# Patient Record
Sex: Female | Born: 1955
Health system: Southern US, Community
[De-identification: ages and names within clinical notes are randomized; demographics above are authoritative.]

## PROBLEM LIST (undated history)

## (undated) DIAGNOSIS — E7800 Pure hypercholesterolemia, unspecified: Secondary | ICD-10-CM

## (undated) DIAGNOSIS — R739 Hyperglycemia, unspecified: Secondary | ICD-10-CM

## (undated) DIAGNOSIS — M48061 Spinal stenosis, lumbar region without neurogenic claudication: Secondary | ICD-10-CM

## (undated) DIAGNOSIS — D329 Benign neoplasm of meninges, unspecified: Secondary | ICD-10-CM

## (undated) DIAGNOSIS — Z9289 Personal history of other medical treatment: Secondary | ICD-10-CM

## (undated) DIAGNOSIS — I743 Embolism and thrombosis of arteries of the lower extremities: Secondary | ICD-10-CM

## (undated) DIAGNOSIS — B029 Zoster without complications: Secondary | ICD-10-CM

## (undated) DIAGNOSIS — I80239 Phlebitis and thrombophlebitis of unspecified tibial vein: Secondary | ICD-10-CM

## (undated) DIAGNOSIS — K449 Diaphragmatic hernia without obstruction or gangrene: Secondary | ICD-10-CM

## (undated) DIAGNOSIS — I10 Essential (primary) hypertension: Secondary | ICD-10-CM

## (undated) DIAGNOSIS — I739 Peripheral vascular disease, unspecified: Secondary | ICD-10-CM

## (undated) DIAGNOSIS — L03113 Cellulitis of right upper limb: Secondary | ICD-10-CM

## (undated) DIAGNOSIS — R809 Proteinuria, unspecified: Secondary | ICD-10-CM

## (undated) DIAGNOSIS — G500 Trigeminal neuralgia: Secondary | ICD-10-CM

## (undated) DIAGNOSIS — M1990 Unspecified osteoarthritis, unspecified site: Secondary | ICD-10-CM

## (undated) DIAGNOSIS — I480 Paroxysmal atrial fibrillation: Secondary | ICD-10-CM

## (undated) DIAGNOSIS — I4891 Unspecified atrial fibrillation: Secondary | ICD-10-CM

## (undated) HISTORY — DX: Peripheral vascular disease, unspecified: I73.9

## (undated) HISTORY — DX: Cellulitis of right upper limb: L03.113

## (undated) HISTORY — PX: LOWER EXTREMITY ANGIOGRAM: SHX5955

## (undated) HISTORY — DX: Unspecified osteoarthritis, unspecified site: M19.90

## (undated) HISTORY — DX: Proteinuria, unspecified: R80.9

## (undated) HISTORY — DX: Essential (primary) hypertension: I10

## (undated) HISTORY — DX: Unspecified atrial fibrillation: I48.91

## (undated) HISTORY — DX: Diaphragmatic hernia without obstruction or gangrene: K44.9

## (undated) HISTORY — DX: Hyperglycemia, unspecified: R73.9

## (undated) HISTORY — PX: AORTOGRAM: SHX6300

## (undated) HISTORY — PX: CHOLECYSTECTOMY: SHX55

## (undated) HISTORY — DX: Trigeminal neuralgia: G50.0

## (undated) HISTORY — DX: Pure hypercholesterolemia, unspecified: E78.00

## (undated) HISTORY — DX: Personal history of other medical treatment: Z92.89

## (undated) HISTORY — DX: Zoster without complications: B02.9

## (undated) HISTORY — DX: Embolism and thrombosis of arteries of the lower extremities: I74.3

## (undated) HISTORY — DX: Spinal stenosis, lumbar region without neurogenic claudication: M48.061

## (undated) HISTORY — PX: KNEE ARTHROSCOPY: SUR90

## (undated) HISTORY — PX: CARDIAC CATHETERIZATION: SHX172

## (undated) HISTORY — PX: JOINT REPLACEMENT: SHX530

## (undated) HISTORY — DX: Phlebitis and thrombophlebitis of unspecified tibial vein: I80.239

## (undated) HISTORY — DX: Paroxysmal atrial fibrillation: I48.0

## (undated) HISTORY — DX: Benign neoplasm of meninges, unspecified: D32.9

---

## 2005-03-20 ENCOUNTER — Ambulatory Visit: Payer: Self-pay | Admitting: Internal Medicine

## 2006-02-17 ENCOUNTER — Ambulatory Visit: Payer: Self-pay | Admitting: Urology

## 2006-03-09 ENCOUNTER — Ambulatory Visit: Payer: Self-pay | Admitting: Gastroenterology

## 2006-03-23 ENCOUNTER — Ambulatory Visit: Payer: Self-pay | Admitting: Internal Medicine

## 2007-03-29 ENCOUNTER — Ambulatory Visit: Payer: Self-pay | Admitting: Internal Medicine

## 2008-03-29 ENCOUNTER — Ambulatory Visit: Payer: Self-pay | Admitting: Internal Medicine

## 2008-05-04 ENCOUNTER — Ambulatory Visit: Payer: Self-pay | Admitting: General Practice

## 2008-05-16 ENCOUNTER — Ambulatory Visit: Payer: Self-pay | Admitting: General Practice

## 2008-06-16 ENCOUNTER — Ambulatory Visit: Payer: Self-pay | Admitting: Orthopedic Surgery

## 2008-06-20 ENCOUNTER — Ambulatory Visit: Payer: Self-pay | Admitting: Orthopedic Surgery

## 2009-04-09 ENCOUNTER — Ambulatory Visit: Payer: Self-pay | Admitting: Internal Medicine

## 2009-06-14 ENCOUNTER — Ambulatory Visit: Payer: Self-pay | Admitting: Gastroenterology

## 2009-06-14 LAB — HM COLONOSCOPY

## 2009-07-16 ENCOUNTER — Ambulatory Visit: Payer: Self-pay | Admitting: Obstetrics and Gynecology

## 2009-07-20 ENCOUNTER — Ambulatory Visit: Payer: Self-pay | Admitting: Obstetrics and Gynecology

## 2010-05-01 ENCOUNTER — Ambulatory Visit: Payer: Self-pay | Admitting: Internal Medicine

## 2011-01-22 ENCOUNTER — Ambulatory Visit: Payer: Self-pay | Admitting: Physical Medicine and Rehabilitation

## 2011-05-06 ENCOUNTER — Ambulatory Visit: Payer: Self-pay | Admitting: Internal Medicine

## 2011-05-06 LAB — HM MAMMOGRAPHY

## 2011-05-13 ENCOUNTER — Encounter: Payer: Self-pay | Admitting: Physical Medicine and Rehabilitation

## 2011-06-02 ENCOUNTER — Encounter: Payer: Self-pay | Admitting: Physical Medicine and Rehabilitation

## 2011-06-12 LAB — LIPID PANEL: Cholesterol: 114 mg/dL (ref 0–200)

## 2011-06-12 LAB — BASIC METABOLIC PANEL
BUN: 24 mg/dL — AB (ref 4–21)
Creatinine: 0.8 mg/dL (ref <=1.1)
Glucose: 106 mg/dL

## 2011-06-12 LAB — HEPATIC FUNCTION PANEL: ALT: 20 U/L (ref 7–35)

## 2011-06-12 LAB — CBC AND DIFFERENTIAL: HCT: 45 % (ref 36–46)

## 2011-12-26 LAB — HM PAP SMEAR: HM Pap smear: NEGATIVE

## 2012-05-06 ENCOUNTER — Ambulatory Visit: Payer: Self-pay | Admitting: Unknown Physician Specialty

## 2012-07-21 ENCOUNTER — Telehealth: Payer: Self-pay | Admitting: Internal Medicine

## 2012-07-21 NOTE — Telephone Encounter (Signed)
Refill request for attenolol 100 mg tablet Sig: take 1 tablet by mouth once daily  Patient does have an appointment on 09/24/12

## 2012-07-26 MED ORDER — ATENOLOL 100 MG PO TABS: 100.0000 mg | ORAL_TABLET | Freq: Every day | ORAL | Status: DC

## 2012-07-26 NOTE — Telephone Encounter (Signed)
Sent in to pharmacy.  

## 2012-09-22 ENCOUNTER — Encounter: Payer: Self-pay | Admitting: *Deleted

## 2012-09-24 ENCOUNTER — Encounter: Payer: Self-pay | Admitting: Internal Medicine

## 2012-09-24 ENCOUNTER — Ambulatory Visit (INDEPENDENT_AMBULATORY_CARE_PROVIDER_SITE_OTHER): Payer: BC Managed Care – PPO | Admitting: Internal Medicine

## 2012-09-24 VITALS — BP 120/80 | HR 72 | Temp 97.8°F | Ht 60.0 in | Wt 225.5 lb

## 2012-09-24 DIAGNOSIS — R739 Hyperglycemia, unspecified: Secondary | ICD-10-CM | POA: Insufficient documentation

## 2012-09-24 DIAGNOSIS — Z23 Encounter for immunization: Secondary | ICD-10-CM

## 2012-09-24 DIAGNOSIS — I10 Essential (primary) hypertension: Secondary | ICD-10-CM

## 2012-09-24 DIAGNOSIS — E7800 Pure hypercholesterolemia, unspecified: Secondary | ICD-10-CM | POA: Insufficient documentation

## 2012-09-24 DIAGNOSIS — R7309 Other abnormal glucose: Secondary | ICD-10-CM

## 2012-09-24 LAB — BASIC METABOLIC PANEL
BUN: 32 mg/dL — ABNORMAL HIGH (ref 6–23)
Creatinine, Ser: 1.1 mg/dL (ref 0.4–1.2)
GFR: 56.75 mL/min — ABNORMAL LOW (ref >60.00)
Potassium: 3.8 mEq/L (ref 3.5–5.1)

## 2012-09-24 LAB — HEPATIC FUNCTION PANEL
ALT: 22 U/L (ref 0–35)
Alkaline Phosphatase: 80 U/L (ref 39–117)
Bilirubin, Direct: 0.1 mg/dL (ref 0.0–0.3)
Total Protein: 7.8 g/dL (ref 6.0–8.3)

## 2012-09-24 LAB — LIPID PANEL
Cholesterol: 127 mg/dL (ref 0–200)
Triglycerides: 112 mg/dL (ref 0.0–149.0)
VLDL: 22.4 mg/dL (ref 0.0–40.0)

## 2012-09-24 LAB — HEMOGLOBIN A1C: Hgb A1c MFr Bld: 6.8 % — ABNORMAL HIGH (ref 4.6–6.5)

## 2012-09-26 ENCOUNTER — Encounter: Payer: Self-pay | Admitting: Internal Medicine

## 2012-09-26 MED ORDER — ATENOLOL 100 MG PO TABS: 100.0000 mg | ORAL_TABLET | Freq: Every day | ORAL | Status: DC

## 2012-09-26 MED ORDER — PROGESTERONE MICRONIZED 100 MG PO CAPS: 100.0000 mg | ORAL_CAPSULE | Freq: Every day | ORAL | Status: DC

## 2012-09-26 MED ORDER — LOSARTAN POTASSIUM-HCTZ 100-25 MG PO TABS: 1.0000 | ORAL_TABLET | Freq: Every day | ORAL | Status: DC

## 2012-09-26 MED ORDER — ATORVASTATIN CALCIUM 40 MG PO TABS: 40.0000 mg | ORAL_TABLET | Freq: Every day | ORAL | Status: DC

## 2012-09-26 MED ORDER — MELOXICAM 15 MG PO TABS: 15.0000 mg | ORAL_TABLET | Freq: Every day | ORAL | Status: DC

## 2012-09-26 NOTE — Progress Notes (Signed)
  Subjective:    Patient ID: Kristin James, female    DOB: 07/17/1956, 57 y.o.   MRN: [id number]  HPI 57 year old female with past history of hypertension, hyperglycemia and hypercholesterolemia who comes in today for a scheduled follow up.  States she is doing well.  Back and leg doing well.  Takes mobic prn.  No chest pain or tightness.  Discussed importance of diet and exercise.  Blood pressure has been doing well.  Breathing stable.   Past Medical History  Diagnosis Date  . Hypertension   . Hypercholesteremia   . Hyperglycemia   . Osteoarthritis   . Proteinuria     Current Outpatient Prescriptions on File Prior to Visit  Medication Sig Dispense Refill  . atenolol (TENORMIN) 100 MG tablet Take 1 tablet (100 mg total) by mouth daily.  90 tablet  1  . felodipine (PLENDIL) 10 MG 24 hr tablet Take 10 mg by mouth daily.      Marland Kitchen losartan-hydrochlorothiazide (HYZAAR) 100-25 MG per tablet Take 1 tablet by mouth daily.  90 tablet  1  . Multiple Vitamins-Minerals (MULTIVITAMIN PO) Take by mouth.      . progesterone (PROMETRIUM) 100 MG capsule Take 1 capsule (100 mg total) by mouth daily.  90 capsule  1    Review of Systems Patient denies any headache, lightheadedness or dizziness. No sinus or allergy symptoms.   No chest pain, tightness or palpitations.  No increased shortness of breath, cough or congestion.  No nausea or vomiting.  No acid reflux.  No abdominal pain or cramping.  No bowel change, such as diarrhea, constipation, BRBPR or melana.  No urine change.  Has joined [employer].  Plans to get more serious about her diet.       Objective:   Physical Exam Filed Vitals:   09/24/12 0800  BP: 120/80  Pulse: 72  Temp: 97.8 F (36.6 C)   Blood pressure recheck;  62/72  57 year old female in no acute distress.   HEENT:  Nares- clear.  Oropharynx - without lesions. NECK:  Supple.  Nontender.  No audible bruit.  HEART:  Appears to be regular. LUNGS:  No crackles or wheezing  audible.  Respirations even and unlabored.  RADIAL PULSE:  Equal bilaterally.  ABDOMEN:  Soft, nontender.  Bowel sounds present and normal.  No audible abdominal bruit.   EXTREMITIES:  No increased edema present.  DP pulses palpable and equal bilaterally.           Assessment & Plan:  MSK.  Back and leg pain much improved.  Only on mobic now.  Follow.    HEMATOLOGY.  Last H/H stable.  Follow.    CARDIOVASCULAR.  Asymptomatic.    WEIGHT LOSS.  Has joined [employer].  Follow.    HEALTH MAINTENANCE.  Last physical 12/26/11.  Mammogram 05/06/11 - BiRADS II.  Obtain mammogram results from 2013.  Pap 2010 negative with negative HPV.  Had repeat pap 12/26/11.  Colonoscopy 10/10 revealed diverticulosis.  Recommended repeat colonoscopy five years.

## 2012-09-26 NOTE — Assessment & Plan Note (Signed)
On lipitor.  Has joined [employer].  Check lipid panel and liver function.

## 2012-09-26 NOTE — Assessment & Plan Note (Signed)
Low carb diet and exercise.  Weight loss.  Check metabolic panel and a1c.

## 2012-09-26 NOTE — Assessment & Plan Note (Signed)
Blood pressure under good control.  Same medication regimen.  Check metabolic panel.    

## 2012-09-27 ENCOUNTER — Other Ambulatory Visit: Payer: Self-pay | Admitting: Internal Medicine

## 2012-09-27 ENCOUNTER — Telehealth: Payer: Self-pay | Admitting: Internal Medicine

## 2012-09-27 DIAGNOSIS — R799 Abnormal finding of blood chemistry, unspecified: Secondary | ICD-10-CM

## 2012-09-27 DIAGNOSIS — N289 Disorder of kidney and ureter, unspecified: Secondary | ICD-10-CM

## 2012-09-27 NOTE — Telephone Encounter (Signed)
Pt was notified of labs via my chart.  She needs a non fasting lab appt within the next 2-3 weeks.  Please schedule and notify pt of lab appt.  Thanks.

## 2012-09-27 NOTE — Progress Notes (Signed)
Follow up met b ordered.   

## 2012-09-29 NOTE — Telephone Encounter (Signed)
Appointment 2/13 @ 8:15/[facility] pt aware of appointment

## 2012-10-14 ENCOUNTER — Other Ambulatory Visit: Payer: BC Managed Care – PPO

## 2012-10-21 ENCOUNTER — Other Ambulatory Visit (INDEPENDENT_AMBULATORY_CARE_PROVIDER_SITE_OTHER): Payer: BC Managed Care – PPO

## 2012-10-21 DIAGNOSIS — R799 Abnormal finding of blood chemistry, unspecified: Secondary | ICD-10-CM

## 2012-10-21 DIAGNOSIS — R7989 Other specified abnormal findings of blood chemistry: Secondary | ICD-10-CM

## 2012-10-21 LAB — BASIC METABOLIC PANEL
BUN: 25 mg/dL — ABNORMAL HIGH (ref 6–23)
Calcium: 9.5 mg/dL (ref 8.4–10.5)
Chloride: 104 mEq/L (ref 96–112)
Creatinine, Ser: 0.9 mg/dL (ref 0.4–1.2)

## 2012-10-22 ENCOUNTER — Telehealth: Payer: Self-pay | Admitting: Internal Medicine

## 2012-10-22 ENCOUNTER — Other Ambulatory Visit: Payer: Self-pay | Admitting: *Deleted

## 2012-10-22 NOTE — Telephone Encounter (Signed)
Notified of labs via my chart 

## 2012-10-25 MED ORDER — FELODIPINE ER 10 MG PO TB24: 10.0000 mg | ORAL_TABLET | Freq: Every day | ORAL | Status: DC

## 2012-11-26 ENCOUNTER — Other Ambulatory Visit: Payer: Self-pay | Admitting: *Deleted

## 2012-11-26 MED ORDER — MELOXICAM 15 MG PO TABS: 15.0000 mg | ORAL_TABLET | Freq: Every day | ORAL | Status: DC

## 2013-01-23 ENCOUNTER — Other Ambulatory Visit: Payer: Self-pay | Admitting: Internal Medicine

## 2013-01-23 MED ORDER — MELOXICAM 15 MG PO TABS: 15.0000 mg | ORAL_TABLET | Freq: Every day | ORAL | Status: DC

## 2013-01-23 NOTE — Progress Notes (Signed)
Refilled mobic #30 with one refill 

## 2013-01-28 ENCOUNTER — Encounter: Payer: Self-pay | Admitting: Internal Medicine

## 2013-01-28 ENCOUNTER — Ambulatory Visit (INDEPENDENT_AMBULATORY_CARE_PROVIDER_SITE_OTHER): Payer: BC Managed Care – PPO | Admitting: Internal Medicine

## 2013-01-28 ENCOUNTER — Other Ambulatory Visit (HOSPITAL_COMMUNITY)
Admission: RE | Admit: 2013-01-28 | Discharge: 2013-01-28 | Disposition: A | Discharge status: Home / Self Care | Payer: BC Managed Care – PPO | Source: Ambulatory Visit | Attending: Internal Medicine | Admitting: Internal Medicine

## 2013-01-28 VITALS — BP 120/70 | HR 69 | Temp 98.6°F | Ht 60.0 in | Wt 224.5 lb

## 2013-01-28 DIAGNOSIS — Z1151 Encounter for screening for human papillomavirus (HPV): Secondary | ICD-10-CM | POA: Insufficient documentation

## 2013-01-28 DIAGNOSIS — I10 Essential (primary) hypertension: Secondary | ICD-10-CM

## 2013-01-28 DIAGNOSIS — E7800 Pure hypercholesterolemia, unspecified: Secondary | ICD-10-CM

## 2013-01-28 DIAGNOSIS — R7309 Other abnormal glucose: Secondary | ICD-10-CM

## 2013-01-28 DIAGNOSIS — Z1211 Encounter for screening for malignant neoplasm of colon: Secondary | ICD-10-CM

## 2013-01-28 DIAGNOSIS — Z124 Encounter for screening for malignant neoplasm of cervix: Secondary | ICD-10-CM

## 2013-01-28 DIAGNOSIS — R739 Hyperglycemia, unspecified: Secondary | ICD-10-CM

## 2013-01-28 DIAGNOSIS — Z01419 Encounter for gynecological examination (general) (routine) without abnormal findings: Secondary | ICD-10-CM | POA: Insufficient documentation

## 2013-01-28 LAB — CBC WITH DIFFERENTIAL/PLATELET
Basophils Relative: 0.8 % (ref 0.0–3.0)
Eosinophils Relative: 1.6 % (ref 0.0–5.0)
HCT: 46.6 % — ABNORMAL HIGH (ref 36.0–46.0)
Hemoglobin: 16 g/dL — ABNORMAL HIGH (ref 12.0–15.0)
MCV: 88.2 fl (ref 78.0–100.0)
Monocytes Absolute: 0.8 10*3/uL (ref 0.1–1.0)
Neutro Abs: 6.7 10*3/uL (ref 1.4–7.7)
Neutrophils Relative %: 64.1 % (ref 43.0–77.0)
RBC: 5.28 Mil/uL — ABNORMAL HIGH (ref 3.87–5.11)
WBC: 10.5 10*3/uL (ref 4.5–10.5)

## 2013-01-28 LAB — LIPID PANEL
HDL: 32.6 mg/dL — ABNORMAL LOW (ref >39.00)
LDL Cholesterol: 79 mg/dL (ref 0–99)
Total CHOL/HDL Ratio: 4
VLDL: 18.8 mg/dL (ref 0.0–40.0)

## 2013-01-28 LAB — BASIC METABOLIC PANEL
BUN: 25 mg/dL — ABNORMAL HIGH (ref 6–23)
Chloride: 104 mEq/L (ref 96–112)
Potassium: 4.3 mEq/L (ref 3.5–5.1)
Sodium: 138 mEq/L (ref 135–145)

## 2013-01-28 LAB — MICROALBUMIN / CREATININE URINE RATIO: Microalb, Ur: 28.9 mg/dL — ABNORMAL HIGH (ref 0.0–1.9)

## 2013-01-28 LAB — HEMOGLOBIN A1C: Hgb A1c MFr Bld: 6.8 % — ABNORMAL HIGH (ref 4.6–6.5)

## 2013-01-28 LAB — HEPATIC FUNCTION PANEL
ALT: 26 U/L (ref 0–35)
Bilirubin, Direct: 0.2 mg/dL (ref 0.0–0.3)
Total Bilirubin: 1 mg/dL (ref 0.3–1.2)

## 2013-01-28 MED ORDER — PANTOPRAZOLE SODIUM 40 MG PO TBEC: 40.0000 mg | DELAYED_RELEASE_TABLET | Freq: Every day | ORAL | Status: DC

## 2013-01-28 NOTE — Progress Notes (Signed)
Subjective:    Patient ID: Kristin James, female    DOB: [birth year], 57 y.o.   MRN: [id number]  HPI 57 year old female with past history of hypertension, hyperglycemia and hypercholesterolemia who comes in today to follow up on these issues as well as for a complete physical exam.   States she is doing well.  Back and leg doing well.  Takes mobic daily.  Controls pain.  Have discussed risk and side effects of mobic.  No chest pain or tightness.  Discussed importance of diet and exercise.  Blood pressure has been doing well.  Breathing stable.  Recently had some vomiting and diarrhea.  This has resolved.  Bowels normal now.  Appetite normal.  Was self limited.     Past Medical History  Diagnosis Date  . Hypertension   . Hypercholesteremia   . Hyperglycemia   . Osteoarthritis   . Proteinuria     Current Outpatient Prescriptions on File Prior to Visit  Medication Sig Dispense Refill  . atenolol (TENORMIN) 100 MG tablet Take 1 tablet (100 mg total) by mouth daily.  90 tablet  1  . atorvastatin (LIPITOR) 40 MG tablet Take 1 tablet (40 mg total) by mouth daily.  90 tablet  1  . felodipine (PLENDIL) 10 MG 24 hr tablet Take 1 tablet (10 mg total) by mouth daily.  30 tablet  3  . losartan-hydrochlorothiazide (HYZAAR) 100-25 MG per tablet Take 1 tablet by mouth daily.  90 tablet  1  . meloxicam (MOBIC) 15 MG tablet Take 1 tablet (15 mg total) by mouth daily.  30 tablet  1  . Multiple Vitamins-Minerals (MULTIVITAMIN PO) Take by mouth.      . progesterone (PROMETRIUM) 100 MG capsule Take 1 capsule (100 mg total) by mouth daily.  90 capsule  1   No current facility-administered medications on file prior to visit.    Review of Systems Patient denies any headache, lightheadedness or dizziness. No sinus or allergy symptoms.   No chest pain, tightness or palpitations.  No increased shortness of breath, cough or congestion.  No nausea or vomiting.  No acid reflux.  No abdominal pain or cramping.  No  bowel change, such as diarrhea, constipation, BRBPR or melana.  No urine change.       Objective:   Physical Exam  Filed Vitals:   01/28/13 0830  BP: 120/70  Pulse: 69  Temp: 98.6 F (37 C)   Blood pressure recheck;  120/74, pulse 32  57 year old female in no acute distress.   HEENT:  Nares- clear.  Oropharynx - without lesions. NECK:  Supple.  Nontender.  No audible bruit.  HEART:  Appears to be regular. LUNGS:  No crackles or wheezing audible.  Respirations even and unlabored.  RADIAL PULSE:  Equal bilaterally.    BREASTS:  No nipple discharge or nipple retraction present.  Could not appreciate any distinct nodules or axillary adenopathy.  ABDOMEN:  Soft, nontender.  Bowel sounds present and normal.  No audible abdominal bruit.  GU:  Normal external genitalia.  Vaginal vault without lesions.  Cervix identified.  Pap performed. Could not appreciate any adnexal masses or tenderness.   RECTAL:  Heme negative.   EXTREMITIES:  No increased edema present.  DP pulses palpable and equal bilaterally.           Assessment & Plan:  MSK.  Back and leg pain much improved.  On mobic.  Follow.    HEMATOLOGY.  Last H/H stable.  Follow.    CARDIOVASCULAR.  Asymptomatic.    WEIGHT LOSS.  Have discussed the importance of diet and exercise.  Follow.   HEALTH MAINTENANCE.  Physical today.  Mammogram 05/06/12 - BiRADS II.   Pap 2010 negative with negative HPV.  Had repeat pap 12/26/11.  Pap today.  Colonoscopy 10/10 revealed diverticulosis.  Recommended repeat colonoscopy five years.  IFOB today.

## 2013-01-30 ENCOUNTER — Telehealth: Payer: Self-pay | Admitting: Internal Medicine

## 2013-01-30 ENCOUNTER — Encounter: Payer: Self-pay | Admitting: Internal Medicine

## 2013-01-30 DIAGNOSIS — Z79899 Other long term (current) drug therapy: Secondary | ICD-10-CM

## 2013-01-30 NOTE — Assessment & Plan Note (Signed)
On lipitor.  Low cholesterol diet and exercise.  Check lipid panel and liver function.   

## 2013-01-30 NOTE — Assessment & Plan Note (Signed)
Blood pressure under good control.  Same medication regimen.  Check metabolic panel.    

## 2013-01-30 NOTE — Assessment & Plan Note (Signed)
Low carb diet and exercise.  Weight loss.  Check metabolic panel and a1c.

## 2013-01-30 NOTE — Telephone Encounter (Signed)
Pt notified of lab results and need for a f/u lab in 2-3 weeks (via my chart).  Please schedule her for a non fasting lab appointment in 2-3 weeks.  Please call her with appt date and time.  Thanks.

## 2013-01-31 NOTE — Telephone Encounter (Signed)
Sent my chart message letting pt know when appointment is  °

## 2013-02-02 ENCOUNTER — Encounter: Payer: Self-pay | Admitting: Internal Medicine

## 2013-02-16 ENCOUNTER — Other Ambulatory Visit: Payer: Self-pay | Admitting: *Deleted

## 2013-02-16 MED ORDER — FELODIPINE ER 10 MG PO TB24: 10.0000 mg | ORAL_TABLET | Freq: Every day | ORAL | Status: DC

## 2013-02-21 ENCOUNTER — Other Ambulatory Visit (INDEPENDENT_AMBULATORY_CARE_PROVIDER_SITE_OTHER): Payer: BC Managed Care – PPO

## 2013-02-21 DIAGNOSIS — Z79899 Other long term (current) drug therapy: Secondary | ICD-10-CM

## 2013-02-21 LAB — BASIC METABOLIC PANEL
Chloride: 104 mEq/L (ref 96–112)
GFR: 53.17 mL/min — ABNORMAL LOW (ref >60.00)
Glucose, Bld: 147 mg/dL — ABNORMAL HIGH (ref 70–99)
Potassium: 4.3 mEq/L (ref 3.5–5.1)
Sodium: 138 mEq/L (ref 135–145)

## 2013-02-28 ENCOUNTER — Encounter: Payer: Self-pay | Admitting: Internal Medicine

## 2013-03-24 ENCOUNTER — Other Ambulatory Visit: Payer: Self-pay | Admitting: *Deleted

## 2013-03-25 ENCOUNTER — Other Ambulatory Visit: Payer: Self-pay | Admitting: *Deleted

## 2013-03-25 MED ORDER — LOSARTAN POTASSIUM-HCTZ 100-25 MG PO TABS: 1.0000 | ORAL_TABLET | Freq: Every day | ORAL | Status: DC

## 2013-03-25 MED ORDER — MELOXICAM 15 MG PO TABS: 15.0000 mg | ORAL_TABLET | Freq: Every day | ORAL | Status: DC

## 2013-04-03 ENCOUNTER — Other Ambulatory Visit: Payer: Self-pay | Admitting: Internal Medicine

## 2013-04-03 MED ORDER — PROGESTERONE MICRONIZED 100 MG PO CAPS: 100.0000 mg | ORAL_CAPSULE | Freq: Every day | ORAL | Status: DC

## 2013-04-03 NOTE — Progress Notes (Signed)
Refilled progesterone #90 with one refill

## 2013-04-08 ENCOUNTER — Other Ambulatory Visit: Payer: Self-pay | Admitting: *Deleted

## 2013-04-08 MED ORDER — ATENOLOL 100 MG PO TABS: 100.0000 mg | ORAL_TABLET | Freq: Every day | ORAL | Status: DC

## 2013-04-15 LAB — HM DIABETES EYE EXAM

## 2013-04-18 ENCOUNTER — Telehealth: Payer: Self-pay | Admitting: *Deleted

## 2013-04-18 NOTE — Telephone Encounter (Signed)
Refill Request  Estrogen-Methyltestos H.S. Tablet  Take one tablet by mouth at bedtime

## 2013-04-18 NOTE — Telephone Encounter (Signed)
Not on meds list, okay to fill?

## 2013-04-19 ENCOUNTER — Other Ambulatory Visit: Payer: Self-pay | Admitting: *Deleted

## 2013-04-19 MED ORDER — EST ESTROGENS-METHYLTEST 0.625-1.25 MG PO TABS: 1.0000 | ORAL_TABLET | Freq: Every day | ORAL | Status: DC

## 2013-04-19 NOTE — Telephone Encounter (Signed)
Sent in new Rx & pt informed

## 2013-04-19 NOTE — Telephone Encounter (Signed)
She was on this previously.  I don't know if she had stopped for a while or is still taking.   Please confirm with pt.  If she has been taking regularly then clarify correct dosing and ok to refill x 4.

## 2013-05-09 ENCOUNTER — Ambulatory Visit: Payer: Self-pay | Admitting: Internal Medicine

## 2013-05-09 LAB — HM MAMMOGRAPHY

## 2013-05-15 ENCOUNTER — Other Ambulatory Visit: Payer: Self-pay | Admitting: Internal Medicine

## 2013-05-15 MED ORDER — PANTOPRAZOLE SODIUM 40 MG PO TBEC: 40.0000 mg | DELAYED_RELEASE_TABLET | Freq: Every day | ORAL | Status: DC

## 2013-05-15 NOTE — Progress Notes (Signed)
Refilled protonix #30 with 3 refills

## 2013-05-17 ENCOUNTER — Ambulatory Visit (INDEPENDENT_AMBULATORY_CARE_PROVIDER_SITE_OTHER): Payer: BC Managed Care – PPO | Admitting: Adult Health

## 2013-05-17 ENCOUNTER — Encounter: Payer: Self-pay | Admitting: Adult Health

## 2013-05-17 ENCOUNTER — Observation Stay: Payer: Self-pay | Admitting: Specialist

## 2013-05-17 VITALS — BP 124/80 | HR 83 | Temp 97.5°F | Resp 12 | Wt 223.0 lb

## 2013-05-17 DIAGNOSIS — R079 Chest pain, unspecified: Secondary | ICD-10-CM | POA: Insufficient documentation

## 2013-05-17 LAB — COMPREHENSIVE METABOLIC PANEL
Albumin: 3.6 g/dL (ref 3.4–5.0)
Anion Gap: 9 (ref 7–16)
BUN: 30 mg/dL — ABNORMAL HIGH (ref 7–18)
Calcium, Total: 9.7 mg/dL (ref 8.5–10.1)
Co2: 23 mmol/L (ref 21–32)
Creatinine: 1.16 mg/dL (ref 0.60–1.30)
Glucose: 141 mg/dL — ABNORMAL HIGH (ref 65–99)
Potassium: 4 mmol/L (ref 3.5–5.1)
SGOT(AST): 28 U/L (ref 15–37)
SGPT (ALT): 27 U/L (ref 12–78)
Sodium: 138 mmol/L (ref 136–145)

## 2013-05-17 LAB — CBC
HCT: 46.3 % (ref 35.0–47.0)
HGB: 16.4 g/dL — ABNORMAL HIGH (ref 12.0–16.0)
MCH: 30.9 pg (ref 26.0–34.0)
MCHC: 35.4 g/dL (ref 32.0–36.0)
Platelet: 241 10*3/uL (ref 150–440)
RBC: 5.31 10*6/uL — ABNORMAL HIGH (ref 3.80–5.20)
RDW: 12.9 % (ref 11.5–14.5)
WBC: 10.7 10*3/uL (ref 3.6–11.0)

## 2013-05-17 LAB — TROPONIN I
Troponin-I: 0.1 ng/mL — ABNORMAL HIGH
Troponin-I: 0.12 ng/mL — ABNORMAL HIGH

## 2013-05-17 LAB — PROTIME-INR: INR: 0.9

## 2013-05-17 LAB — PRO B NATRIURETIC PEPTIDE: B-Type Natriuretic Peptide: 254 pg/mL — ABNORMAL HIGH (ref 0–125)

## 2013-05-17 LAB — APTT: Activated PTT: 23.8 secs (ref 23.6–35.9)

## 2013-05-17 NOTE — Assessment & Plan Note (Addendum)
Abnormal EKG shows nonspecific ST depression. Patient's symptoms appear to be cardiac related. Aspirin 325 mg given. Sent to the ED for further w/u and evaluation. Patient has a family member with her who will drive her to the ED.

## 2013-05-17 NOTE — Patient Instructions (Addendum)
  Please go to the ED for evaluation.  We will call report to the triage nurse.

## 2013-05-17 NOTE — Progress Notes (Signed)
  Subjective:    Patient ID: Kristin James, female    DOB: 25-Apr-1956, 57 y.o.   MRN: [id number]  HPI  She was recently seen in clinic for indigestion and started on protonix. On Wednesday night patient reports heaviness in the center of her chest. It felt it took her breath away because of the pain. She reports she had eaten fairly late and thought it was related to this. She went to bed with pain on Wednesday. Episodes occurred again on Thursday. She reports pain was severe. She felt slightly lightheaded during the episode. Diaphoresis was also reported as well as some radiating of pain into her jaw area. Patient did not come in yesterday because she was getting a pedicure. Pain again occurred on the ride here this morning. She reports feeling like "it was the longest ride ever". Pain subsided just as she got here. Pt reports family hx of heart dz.    Current Outpatient Prescriptions on File Prior to Visit  Medication Sig Dispense Refill  . atenolol (TENORMIN) 100 MG tablet Take 1 tablet (100 mg total) by mouth daily.  90 tablet  1  . atorvastatin (LIPITOR) 40 MG tablet Take 1 tablet (40 mg total) by mouth daily.  90 tablet  1  . estrogen-methylTESTOSTERone 0.625-1.25 MG per tablet Take 1 tablet by mouth daily.  30 tablet  4  . felodipine (PLENDIL) 10 MG 24 hr tablet Take 1 tablet (10 mg total) by mouth daily.  30 tablet  5  . losartan-hydrochlorothiazide (HYZAAR) 100-25 MG per tablet Take 1 tablet by mouth daily.  90 tablet  1  . meloxicam (MOBIC) 15 MG tablet Take 1 tablet (15 mg total) by mouth daily.  30 tablet  2  . Multiple Vitamins-Minerals (MULTIVITAMIN PO) Take by mouth.      . pantoprazole (PROTONIX) 40 MG tablet Take 1 tablet (40 mg total) by mouth daily.  30 tablet  3  . progesterone (PROMETRIUM) 100 MG capsule Take 1 capsule (100 mg total) by mouth daily.  90 capsule  1   No current facility-administered medications on file prior to visit.    Review of Systems  Constitutional:  Positive for diaphoresis.  HENT: Negative.   Respiratory: Positive for chest tightness and shortness of breath.   Cardiovascular: Positive for chest pain. Negative for palpitations and leg swelling.       Radiating to left side of jaw  Gastrointestinal: Negative for nausea.  Genitourinary: Negative.   Neurological: Positive for light-headedness.  Psychiatric/Behavioral: Negative.   All other systems reviewed and are negative.        Objective:   Physical Exam  Constitutional: She is oriented to person, place, and time.  Overweight, 57 year old female in no apparent distress  Cardiovascular: Normal rate, regular rhythm and normal heart sounds.  Exam reveals no gallop and no friction rub.   No murmur heard. Abnormal EKG - non specific T wave depression  Pulmonary/Chest: Effort normal and breath sounds normal. No respiratory distress. She has no wheezes. She has no rales. She exhibits no tenderness.  Neurological: She is alert and oriented to person, place, and time.  Psychiatric: She has a normal mood and affect. Her behavior is normal. Judgment and thought content normal.    BP 124/80  Pulse 83  Temp(Src) 97.5 F (36.4 C) (Oral)  Resp 12  Wt 223 lb (101.152 kg)  BMI 43.55 kg/m2  SpO2 97%     Assessment & Plan:

## 2013-05-18 ENCOUNTER — Other Ambulatory Visit: Payer: Self-pay

## 2013-05-18 DIAGNOSIS — E7800 Pure hypercholesterolemia, unspecified: Secondary | ICD-10-CM

## 2013-05-18 DIAGNOSIS — I10 Essential (primary) hypertension: Secondary | ICD-10-CM

## 2013-05-18 DIAGNOSIS — R079 Chest pain, unspecified: Secondary | ICD-10-CM

## 2013-05-18 DIAGNOSIS — R739 Hyperglycemia, unspecified: Secondary | ICD-10-CM

## 2013-05-18 LAB — CBC WITH DIFFERENTIAL/PLATELET
Basophil #: 0.1 10*3/uL (ref 0.0–0.1)
Basophil %: 0.8 %
Eosinophil #: 0.2 10*3/uL (ref 0.0–0.7)
HCT: 42.9 % (ref 35.0–47.0)
HGB: 15 g/dL (ref 12.0–16.0)
Lymphocyte %: 39.3 %
MCH: 30.6 pg (ref 26.0–34.0)
MCHC: 35.1 g/dL (ref 32.0–36.0)
MCV: 87 fL (ref 80–100)
Monocyte #: 0.8 x10 3/mm (ref 0.2–0.9)
Neutrophil %: 49.3 %
RBC: 4.91 10*6/uL (ref 3.80–5.20)
RDW: 12.9 % (ref 11.5–14.5)

## 2013-05-18 LAB — BASIC METABOLIC PANEL
Anion Gap: 7 (ref 7–16)
Chloride: 108 mmol/L — ABNORMAL HIGH (ref 98–107)
Co2: 25 mmol/L (ref 21–32)
Glucose: 104 mg/dL — ABNORMAL HIGH (ref 65–99)
Osmolality: 285 (ref 275–301)
Potassium: 3.6 mmol/L (ref 3.5–5.1)

## 2013-05-18 LAB — LIPID PANEL
Cholesterol: 123 mg/dL (ref 0–200)
Ldl Cholesterol, Calc: 66 mg/dL (ref 0–100)

## 2013-05-18 LAB — TROPONIN I: Troponin-I: 0.12 ng/mL — ABNORMAL HIGH

## 2013-05-18 MED ORDER — NITROGLYCERIN 0.4 MG SL SUBL: 0.4000 mg | SUBLINGUAL_TABLET | SUBLINGUAL | Status: DC | PRN

## 2013-05-18 MED ORDER — HYOSCYAMINE SULFATE 0.125 MG SL SUBL: 0.1250 mg | SUBLINGUAL_TABLET | SUBLINGUAL | Status: DC | PRN

## 2013-05-31 ENCOUNTER — Ambulatory Visit: Payer: BC Managed Care – PPO | Admitting: Internal Medicine

## 2013-06-01 ENCOUNTER — Ambulatory Visit (INDEPENDENT_AMBULATORY_CARE_PROVIDER_SITE_OTHER): Payer: BC Managed Care – PPO | Admitting: Adult Health

## 2013-06-01 ENCOUNTER — Encounter: Payer: Self-pay | Admitting: Adult Health

## 2013-06-01 VITALS — BP 124/68 | HR 69 | Temp 98.3°F | Resp 12 | Wt 227.0 lb

## 2013-06-01 DIAGNOSIS — Z23 Encounter for immunization: Secondary | ICD-10-CM

## 2013-06-01 DIAGNOSIS — Z09 Encounter for follow-up examination after completed treatment for conditions other than malignant neoplasm: Secondary | ICD-10-CM

## 2013-06-01 NOTE — Progress Notes (Signed)
Subjective:    Patient ID: Kristin James, female    DOB: 1956/01/10, 57 y.o.   MRN: [id number]  HPI  A pleasant 57 year old female who presents to clinic status post hospitalization for chest pain. She was seen in clinic on 05/17/2013 with complaints of heaviness in the center of her chest which took her breath away. Along with the symptoms she also felt lightheaded and slightly diaphoretic. She also reported some radiation of the pain into her jaw area. During visit she had an abnormal EKG showing nonspecific ST depression. At that time the patient was given an aspirin and sent to the ED for further evaluation. She has a history of hypertension and hyperlipidemia. In addition she is also overweight. In the ED she was found to have a slightly elevated troponin. Patient was admitted for suspected unstable angina and further cardiac workup. During hospitalization she was seen by cardiologist, Dr. Mariah Milling. Patient had myocardial scan which showed no evidence of acute MI or any new EKG changes or myocardial perfusion problems. After extensive cardiac workup, her chest pain was believed to be related to hiatal hernia and or GERD. Patient was discharged with sublingual nitroglycerin and hyoscyamine as needed. Patient was hospitalized from 05/17/2013 through 05/18/2013. She presents to clinic today for followup. She is feeling well. She denies any chest discomfort. She has returned to work and her usual activities of daily living. Continues to take medication exactly as prescribed.  Medications post discharge are nitroglycerin sublingual tablets and hyoscyamine as needed. She has a followup appointment with Dr. Lorin Picket on 06/30/2013.   Past Medical History  Diagnosis Date  . Hypertension   . Hypercholesteremia   . Hyperglycemia   . Osteoarthritis   . Proteinuria      Past Surgical History  Procedure Laterality Date  . Cholecystectomy    . Knee arthroscopy       Family History  Problem Relation Age  of Onset  . Hypertension Mother   . Stroke Father     3 strokes  . Hypertension Father   . Breast cancer Neg Hx   . Colon cancer Neg Hx      History   Social History  . Marital Status: Married    Spouse Name: N/A    Number of Children: 2  . Years of Education: N/A   Occupational History  . Not on file.   Social History Main Topics  . Smoking status: Never Smoker   . Smokeless tobacco: Never Used  . Alcohol Use: Yes  . Drug Use: No  . Sexual Activity: Not on file   Other Topics Concern  . Not on file   Social History Narrative  . No narrative on file      Current Outpatient Prescriptions on File Prior to Visit  Medication Sig Dispense Refill  . atenolol (TENORMIN) 100 MG tablet Take 1 tablet (100 mg total) by mouth daily.  90 tablet  1  . atorvastatin (LIPITOR) 40 MG tablet Take 1 tablet (40 mg total) by mouth daily.  90 tablet  1  . estrogen-methylTESTOSTERone 0.625-1.25 MG per tablet Take 1 tablet by mouth daily.  30 tablet  4  . felodipine (PLENDIL) 10 MG 24 hr tablet Take 1 tablet (10 mg total) by mouth daily.  30 tablet  5  . hyoscyamine (LEVSIN/SL) 0.125 MG SL tablet Place 1 tablet (0.125 mg total) under the tongue every 4 (four) hours as needed for cramping.  20 tablet  3  . losartan-hydrochlorothiazide (HYZAAR) 100-25  MG per tablet Take 1 tablet by mouth daily.  90 tablet  1  . meloxicam (MOBIC) 15 MG tablet Take 1 tablet (15 mg total) by mouth daily.  30 tablet  2  . Multiple Vitamins-Minerals (MULTIVITAMIN PO) Take by mouth.      . nitroGLYCERIN (NITROSTAT) 0.4 MG SL tablet Place 1 tablet (0.4 mg total) under the tongue every 5 (five) minutes as needed for chest pain.  25 tablet  3  . pantoprazole (PROTONIX) 40 MG tablet Take 1 tablet (40 mg total) by mouth daily.  30 tablet  3  . progesterone (PROMETRIUM) 100 MG capsule Take 1 capsule (100 mg total) by mouth daily.  90 capsule  1   No current facility-administered medications on file prior to visit.      Review of Systems  Constitutional: Negative.   Respiratory: Negative.   Cardiovascular: Negative.   Gastrointestinal: Negative.   Genitourinary: Negative.   Neurological: Negative.   Psychiatric/Behavioral: Negative.   All other systems reviewed and are negative.       Objective:   Physical Exam  Constitutional: She is oriented to person, place, and time.  Overweight, pleasant 57 year old female in no apparent distress  HENT:  Head: Normocephalic and atraumatic.  Eyes: Conjunctivae and EOM are normal. Pupils are equal, round, and reactive to light.  Neck: Normal range of motion. Neck supple. No tracheal deviation present.  Cardiovascular: Normal rate, regular rhythm, normal heart sounds and intact distal pulses.  Exam reveals no gallop and no friction rub.   No murmur heard. Pulmonary/Chest: Effort normal and breath sounds normal. No respiratory distress. She has no wheezes. She has no rales. She exhibits no tenderness.  Abdominal: Soft. Bowel sounds are normal. She exhibits no distension and no mass. There is no tenderness. There is no rebound and no guarding.  Musculoskeletal: Normal range of motion. She exhibits no edema and no tenderness.  Lymphadenopathy:    She has no cervical adenopathy.  Neurological: She is alert and oriented to person, place, and time.  Skin: Skin is warm.  Psychiatric: She has a normal mood and affect. Her behavior is normal. Judgment and thought content normal.          Assessment & Plan:

## 2013-06-01 NOTE — Assessment & Plan Note (Signed)
Patient presents for followup status post hospitalization from 05/17/2013 through 05/18/2013 for cardiac related symptoms. During hospitalization these were ruled out. Symptoms appear to be more related to GI. She was discharged home on nitroglycerin sublingual and hyoscyamine as needed. She is currently not exhibiting any symptoms and is feeling well. She has resumed her regular activities without any problems. Continues with all her medications exactly as prescribed including pantoprazole. Should she began to exhibit symptoms again, we discussed possible referral to GI. She is in agreement with this plan. No restrictions.

## 2013-06-02 ENCOUNTER — Ambulatory Visit: Payer: BC Managed Care – PPO | Admitting: Adult Health

## 2013-06-26 ENCOUNTER — Other Ambulatory Visit: Payer: Self-pay | Admitting: Internal Medicine

## 2013-06-26 NOTE — Progress Notes (Signed)
Opened in error

## 2013-06-29 ENCOUNTER — Other Ambulatory Visit: Payer: Self-pay | Admitting: *Deleted

## 2013-06-29 MED ORDER — MELOXICAM 15 MG PO TABS: 15.0000 mg | ORAL_TABLET | Freq: Every day | ORAL | Status: DC

## 2013-06-29 NOTE — Telephone Encounter (Signed)
Notified pt that we sent in a 30 day supply (no refills) since she has an appt on 11/12. Dr. Lorin Picket would like to repeat labs at appt as part of a hospital f/u. Pt also advised to stay hydrated

## 2013-06-30 ENCOUNTER — Ambulatory Visit: Payer: BC Managed Care – PPO | Admitting: Internal Medicine

## 2013-07-04 ENCOUNTER — Other Ambulatory Visit: Payer: Self-pay | Admitting: *Deleted

## 2013-07-04 MED ORDER — ATORVASTATIN CALCIUM 40 MG PO TABS: 40.0000 mg | ORAL_TABLET | Freq: Every day | ORAL | Status: DC

## 2013-07-13 ENCOUNTER — Ambulatory Visit (INDEPENDENT_AMBULATORY_CARE_PROVIDER_SITE_OTHER): Payer: BC Managed Care – PPO | Admitting: Internal Medicine

## 2013-07-13 ENCOUNTER — Encounter: Payer: Self-pay | Admitting: Internal Medicine

## 2013-07-13 VITALS — BP 110/80 | HR 61 | Temp 98.3°F | Ht 60.0 in | Wt 228.5 lb

## 2013-07-13 DIAGNOSIS — R7309 Other abnormal glucose: Secondary | ICD-10-CM

## 2013-07-13 DIAGNOSIS — R739 Hyperglycemia, unspecified: Secondary | ICD-10-CM

## 2013-07-13 DIAGNOSIS — I10 Essential (primary) hypertension: Secondary | ICD-10-CM

## 2013-07-13 DIAGNOSIS — R079 Chest pain, unspecified: Secondary | ICD-10-CM

## 2013-07-13 DIAGNOSIS — E7800 Pure hypercholesterolemia, unspecified: Secondary | ICD-10-CM

## 2013-07-13 LAB — HM DIABETES FOOT EXAM

## 2013-07-13 NOTE — Progress Notes (Signed)
Subjective:    Patient ID: Kristin James, female    DOB: 1956/04/05, 57 y.o.   MRN: [id number]  HPI 57 year old female with past history of hypertension, hyperglycemia and hypercholesterolemia who comes in today for a scheduled follow up.   States she is doing well.  Recently had a flare with her back. Takes mobic daily.  Was controlled.  Increased pain with pain going down her legs.  Went to chiropractor (Dr Jeannetta Ellis).  Had xray.  Diagnosed with sciatica.  Saw the chiropractor for the first time yesterday.   No chest pain or tightness.  Discussed importance of diet and exercise.  Blood pressure has been doing well.  Breathing stable.     Past Medical History  Diagnosis Date  . Hypertension   . Hypercholesteremia   . Hyperglycemia   . Osteoarthritis   . Proteinuria     Current Outpatient Prescriptions on File Prior to Visit  Medication Sig Dispense Refill  . atenolol (TENORMIN) 100 MG tablet Take 1 tablet (100 mg total) by mouth daily.  90 tablet  1  . atorvastatin (LIPITOR) 40 MG tablet Take 1 tablet (40 mg total) by mouth daily.  90 tablet  1  . estrogen-methylTESTOSTERone 0.625-1.25 MG per tablet Take 1 tablet by mouth daily.  30 tablet  4  . felodipine (PLENDIL) 10 MG 24 hr tablet Take 1 tablet (10 mg total) by mouth daily.  30 tablet  5  . hyoscyamine (LEVSIN/SL) 0.125 MG SL tablet Place 1 tablet (0.125 mg total) under the tongue every 4 (four) hours as needed for cramping.  20 tablet  3  . losartan-hydrochlorothiazide (HYZAAR) 100-25 MG per tablet Take 1 tablet by mouth daily.  90 tablet  1  . meloxicam (MOBIC) 15 MG tablet Take 1 tablet (15 mg total) by mouth daily.  30 tablet  0  . Multiple Vitamins-Minerals (MULTIVITAMIN PO) Take by mouth.      . nitroGLYCERIN (NITROSTAT) 0.4 MG SL tablet Place 1 tablet (0.4 mg total) under the tongue every 5 (five) minutes as needed for chest pain.  25 tablet  3  . pantoprazole (PROTONIX) 40 MG tablet Take 1 tablet (40 mg total) by mouth daily.   30 tablet  3  . progesterone (PROMETRIUM) 100 MG capsule Take 1 capsule (100 mg total) by mouth daily.  90 capsule  1   No current facility-administered medications on file prior to visit.    Review of Systems Patient denies any headache, lightheadedness or dizziness. No sinus or allergy symptoms.   No chest pain, tightness or palpitations.  No increased shortness of breath, cough or congestion.  No nausea or vomiting.  No acid reflux.  No abdominal pain or cramping.  No bowel change, such as diarrhea, constipation, BRBPR or melana.  No urine change.  Back and leg pain (flare) as outlined.       Objective:   Physical Exam  Filed Vitals:   07/13/13 1036  BP: 110/80  Pulse: 61  Temp: 98.3 F (13.23 C)   57 year old female in no acute distress.   HEENT:  Nares- clear.  Oropharynx - without lesions. NECK:  Supple.  Nontender.  No audible bruit.  HEART:  Appears to be regular. LUNGS:  No crackles or wheezing audible.  Respirations even and unlabored.  RADIAL PULSE:  Equal bilaterally.  ABDOMEN:  Soft, nontender.  Bowel sounds present and normal.  No audible abdominal bruit.   EXTREMITIES:  No increased edema present.  DP  pulses palpable and equal bilaterally.  FEET:  Without lesions.           Assessment & Plan:  MSK.  Back pain and leg pain as outlined.  Seeing a chiropractor now.  On mobic.  Desires no further intervention at this time.  Follow.    HEMATOLOGY.  Last H/H stable.  Follow.    CARDIOVASCULAR.  Asymptomatic.    WEIGHT LOSS.  Have discussed the importance of diet and exercise.  Follow.   HEALTH MAINTENANCE.  Physical 01/28/13.  Pap at physical negative with negative HPV.  Mammogram 05/06/12 - BiRADS II.  Due f/u mammogram.  Colonoscopy 10/10 revealed diverticulosis.  Recommended repeat colonoscopy five years.

## 2013-07-13 NOTE — Progress Notes (Signed)
Pre-visit discussion using our clinic review tool. No additional management support is needed unless otherwise documented below in the visit note., 

## 2013-07-16 ENCOUNTER — Encounter: Payer: Self-pay | Admitting: Internal Medicine

## 2013-07-16 NOTE — Assessment & Plan Note (Signed)
Blood pressure under good control.  Same medication regimen.  Check metabolic panel.    

## 2013-07-16 NOTE — Assessment & Plan Note (Signed)
Low carb diet and exercise.  Weight loss.  Check metabolic panel and a1c.

## 2013-07-16 NOTE — Assessment & Plan Note (Signed)
On lipitor.  Low cholesterol diet and exercise.   Follow lipid panel and liver function.   

## 2013-07-16 NOTE — Assessment & Plan Note (Signed)
Resolved

## 2013-08-01 ENCOUNTER — Telehealth: Payer: Self-pay | Admitting: *Deleted

## 2013-08-01 DIAGNOSIS — E7800 Pure hypercholesterolemia, unspecified: Secondary | ICD-10-CM

## 2013-08-01 DIAGNOSIS — I10 Essential (primary) hypertension: Secondary | ICD-10-CM

## 2013-08-01 DIAGNOSIS — R739 Hyperglycemia, unspecified: Secondary | ICD-10-CM

## 2013-08-01 NOTE — Telephone Encounter (Signed)
Orders placed for f/u labs.  

## 2013-08-01 NOTE — Telephone Encounter (Signed)
Pt is coming in tomorrow what labs and dx?  

## 2013-08-02 ENCOUNTER — Other Ambulatory Visit (INDEPENDENT_AMBULATORY_CARE_PROVIDER_SITE_OTHER): Payer: BC Managed Care – PPO

## 2013-08-02 ENCOUNTER — Other Ambulatory Visit: Payer: Self-pay | Admitting: Internal Medicine

## 2013-08-02 ENCOUNTER — Telehealth: Payer: Self-pay | Admitting: *Deleted

## 2013-08-02 DIAGNOSIS — E7800 Pure hypercholesterolemia, unspecified: Secondary | ICD-10-CM

## 2013-08-02 DIAGNOSIS — R739 Hyperglycemia, unspecified: Secondary | ICD-10-CM

## 2013-08-02 DIAGNOSIS — I10 Essential (primary) hypertension: Secondary | ICD-10-CM

## 2013-08-02 DIAGNOSIS — R7309 Other abnormal glucose: Secondary | ICD-10-CM

## 2013-08-02 LAB — LIPID PANEL
Cholesterol: 150 mg/dL (ref 0–200)
HDL: 31.9 mg/dL — ABNORMAL LOW (ref >39.00)
Total CHOL/HDL Ratio: 5
Triglycerides: 149 mg/dL (ref 0.0–149.0)

## 2013-08-02 LAB — BASIC METABOLIC PANEL
CO2: 26 mEq/L (ref 19–32)
Calcium: 9.3 mg/dL (ref 8.4–10.5)
Creatinine, Ser: 1.1 mg/dL (ref 0.4–1.2)
GFR: 55.37 mL/min — ABNORMAL LOW (ref >60.00)
Glucose, Bld: 133 mg/dL — ABNORMAL HIGH (ref 70–99)

## 2013-08-02 LAB — HEPATIC FUNCTION PANEL
ALT: 22 U/L (ref 0–35)
AST: 20 U/L (ref 0–37)
Alkaline Phosphatase: 83 U/L (ref 39–117)
Total Bilirubin: 0.9 mg/dL (ref 0.3–1.2)

## 2013-08-02 NOTE — Progress Notes (Signed)
Order placed for urine microalbumin/cr ratio

## 2013-08-02 NOTE — Telephone Encounter (Signed)
I placed the order for the referral

## 2013-08-02 NOTE — Telephone Encounter (Signed)
Pt would like to do a urine for protein

## 2013-08-08 ENCOUNTER — Other Ambulatory Visit: Payer: BC Managed Care – PPO

## 2013-08-17 ENCOUNTER — Other Ambulatory Visit: Payer: Self-pay | Admitting: *Deleted

## 2013-08-17 MED ORDER — FELODIPINE ER 10 MG PO TB24: 10.0000 mg | ORAL_TABLET | Freq: Every day | ORAL | Status: DC

## 2013-08-18 ENCOUNTER — Encounter: Payer: Self-pay | Admitting: *Deleted

## 2013-08-23 NOTE — Telephone Encounter (Signed)
Mailed copy of labs to patient

## 2013-09-15 ENCOUNTER — Encounter: Payer: Self-pay | Admitting: *Deleted

## 2013-09-23 ENCOUNTER — Other Ambulatory Visit: Payer: Self-pay | Admitting: *Deleted

## 2013-09-23 MED ORDER — EST ESTROGENS-METHYLTEST 0.625-1.25 MG PO TABS: 1.0000 | ORAL_TABLET | Freq: Every day | ORAL | Status: DC

## 2013-09-23 MED ORDER — PANTOPRAZOLE SODIUM 40 MG PO TBEC: 40.0000 mg | DELAYED_RELEASE_TABLET | Freq: Every day | ORAL | Status: DC

## 2013-09-23 NOTE — Telephone Encounter (Signed)
Ok refill? 

## 2013-09-23 NOTE — Telephone Encounter (Signed)
Rx faxed to pharmacy  

## 2013-09-23 NOTE — Telephone Encounter (Signed)
Refilled #30 with 3 refills. 

## 2013-09-26 ENCOUNTER — Other Ambulatory Visit: Payer: Self-pay | Admitting: *Deleted

## 2013-09-26 MED ORDER — PROGESTERONE MICRONIZED 100 MG PO CAPS: 100.0000 mg | ORAL_CAPSULE | Freq: Every day | ORAL | Status: DC

## 2013-09-26 MED ORDER — LOSARTAN POTASSIUM-HCTZ 100-25 MG PO TABS: 1.0000 | ORAL_TABLET | Freq: Every day | ORAL | Status: DC

## 2013-10-03 ENCOUNTER — Other Ambulatory Visit: Payer: Self-pay | Admitting: *Deleted

## 2013-10-03 MED ORDER — ATENOLOL 100 MG PO TABS: 100.0000 mg | ORAL_TABLET | Freq: Every day | ORAL | Status: DC

## 2013-10-25 ENCOUNTER — Encounter: Payer: Self-pay | Admitting: Internal Medicine

## 2013-11-07 ENCOUNTER — Ambulatory Visit (INDEPENDENT_AMBULATORY_CARE_PROVIDER_SITE_OTHER): Payer: BC Managed Care – PPO | Admitting: Internal Medicine

## 2013-11-07 ENCOUNTER — Encounter: Payer: Self-pay | Admitting: Internal Medicine

## 2013-11-07 VITALS — BP 142/90 | HR 75 | Temp 97.9°F | Resp 16 | Wt 218.0 lb

## 2013-11-07 DIAGNOSIS — R29898 Other symptoms and signs involving the musculoskeletal system: Secondary | ICD-10-CM

## 2013-11-07 DIAGNOSIS — R079 Chest pain, unspecified: Secondary | ICD-10-CM

## 2013-11-07 MED ORDER — PREDNISONE 10 MG PO TABS: ORAL_TABLET | ORAL | Status: DC

## 2013-11-07 MED ORDER — HYDROCODONE-ACETAMINOPHEN 5-325 MG PO TABS: 1.0000 | ORAL_TABLET | Freq: Every evening | ORAL | Status: DC | PRN

## 2013-11-07 MED ORDER — METHOCARBAMOL 500 MG PO TABS: 500.0000 mg | ORAL_TABLET | Freq: Three times a day (TID) | ORAL | Status: DC | PRN

## 2013-11-07 NOTE — Progress Notes (Signed)
Pre-visit discussion using our clinic review tool. No additional management support is needed unless otherwise documented below in the visit note.  

## 2013-11-07 NOTE — Patient Instructions (Addendum)
Suspend the meloxicam while you are on the prednisone   Prednisone 60 mg daily for 4 days,  Then decrease  By 10 mg daily until gone  Robaxin as needed for muscle spasm (may make you sleepy)  vicodin 1 or 2 at bedtime as needed for severe pain  MRI if no improvement in a few days   Avoid bending over and any lifting for a week  Use heat in the mornign and ice after activity

## 2013-11-07 NOTE — Progress Notes (Addendum)
Patient ID: Kristin James, female   DOB: 22-Dec-1955, 58 y.o.   MRN: [id number]    Patient Active Problem List   Diagnosis Date Noted  . Left leg weakness 11/08/2013  . Chest pain 05/17/2013  . Hypertension 09/24/2012  . Hypercholesterolemia 09/24/2012  . Hyperglycemia 09/24/2012    Subjective:  CC:   Chief Complaint  Patient presents with  . Acute Visit    Unable lift left leg on saturday. left arm holoding odd but denies tingling or pain.    HPI:   Kristin James is a 58 y.o. female who presents for Heaviness in the  Left leg which started 5 days ago,  Difficulty climbing stairs .  Family also noticed that she was holding her left arm in a peculiar way, as though it were in a sling. Patient denies pain in the left arm or shoulder,  Denies chest pain, headache,  No trauma,  Has a history of sciatica, with prior neurosurgical consult 2011 by Dr Glenna Fellows for L4-l5 HERNIATION  But deferred surgery at his advice .  Was told to lose weight and take (meloxicam)  and do PT. Symptoms  Improved with 20 wt loss but she has gained the weight back and has been seeing a chiropractor for low back pain .  Last adjustment and treatment with TENS was one day prior to onset of symptoms,  By  Dr Francisca December   Past Medical History  Diagnosis Date  . Hypertension   . Hypercholesteremia   . Hyperglycemia   . Osteoarthritis   . Proteinuria     Past Surgical History  Procedure Laterality Date  . Cholecystectomy    . Knee arthroscopy         The following portions of the patient's history were reviewed and updated as appropriate: Allergies, current medications, and problem list.    Review of Systems:   Patient denies headache, fevers, malaise, unintentional weight loss, skin rash, eye pain, sinus congestion and sinus pain, sore throat, dysphagia,  hemoptysis , cough, dyspnea, wheezing, chest pain, palpitations, orthopnea, edema, abdominal pain, nausea, melena, diarrhea, constipation, flank pain,  dysuria, hematuria, urinary  Frequency, nocturia, numbness, tingling, seizures,  Focal weakness, Loss of consciousness,  Tremor, insomnia, depression, anxiety, and suicidal ideation.     History   Social History  . Marital Status: Married    Spouse Name: N/A    Number of Children: 2  . Years of Education: N/A   Occupational History  . Not on file.   Social History Main Topics  . Smoking status: Never Smoker   . Smokeless tobacco: Never Used  . Alcohol Use: Yes  . Drug Use: No  . Sexual Activity: Not on file   Other Topics Concern  . Not on file   Social History Narrative  . No narrative on file    Objective:  Filed Vitals:   11/07/13 1821  BP: 142/90  Pulse: 75  Temp: 97.9 F (36.6 C)  Resp: 16     General appearance: alert, cooperative and appears stated age Neck: no adenopathy, no carotid bruit, supple, symmetrical, trachea midline and thyroid not enlarged, symmetric, no tenderness/mass/nodules Back: symmetric, no curvature. ROM normal. No CVA tenderness. Lungs: clear to auscultation bilaterally Heart: regular rate and rhythm, S1, S2 normal, no murmur, click, rub or gallop Pulses: 2+ and symmetric Skin: Skin color, texture, turgor normal. No rashes or lesions Lymph nodes: Cervical, supraclavicular, and axillary nodes normal. Neuro: CNs 2-12 intact. DTRs 2+/4 in biceps, brachioradialis, patellars  and achilles. Muscle strength 5/5 in upper and lower exremities except for left thigh. 4/5 in hamstrings. . cerebellar function normal. Romberg negative.  No pronator drift.   Gait normal.   Assessment and Plan:  Left leg weakness History of sciatica relevant.  Her weakness on today's exam is parimarily hamstrings,  Comprehensive Neuro exam was otherwise normal.  Symptoms have not progressed since Thursday and she has normal DTRS so will not repeat MRI at this time,  Prednisone taper,  Avoid heavy lifting and bending over .  If not improved in a few days,  MRI.    A  total of 40 minutes was spent with patient more than half of which was spent in counseling, reviewing records from other prviders and coordination of care.   Updated Medication List Outpatient Encounter Prescriptions as of 11/07/2013  Medication Sig  . atenolol (TENORMIN) 100 MG tablet Take 1 tablet (100 mg total) by mouth daily.  Marland Kitchen atorvastatin (LIPITOR) 40 MG tablet Take 1 tablet (40 mg total) by mouth daily.  Marland Kitchen estrogen-methylTESTOSTERone 0.625-1.25 MG per tablet Take 1 tablet by mouth daily.  . felodipine (PLENDIL) 10 MG 24 hr tablet Take 1 tablet (10 mg total) by mouth daily.  . hyoscyamine (LEVSIN/SL) 0.125 MG SL tablet Place 1 tablet (0.125 mg total) under the tongue every 4 (four) hours as needed for cramping.  Marland Kitchen losartan-hydrochlorothiazide (HYZAAR) 100-25 MG per tablet Take 1 tablet by mouth daily.  . meloxicam (MOBIC) 15 MG tablet Take 1 tablet (15 mg total) by mouth daily.  . Multiple Vitamins-Minerals (MULTIVITAMIN PO) Take by mouth.  . nitroGLYCERIN (NITROSTAT) 0.4 MG SL tablet Place 1 tablet (0.4 mg total) under the tongue every 5 (five) minutes as needed for chest pain.  . pantoprazole (PROTONIX) 40 MG tablet Take 1 tablet (40 mg total) by mouth daily.  . progesterone (PROMETRIUM) 100 MG capsule Take 1 capsule (100 mg total) by mouth daily.  Marland Kitchen HYDROcodone-acetaminophen (NORCO/VICODIN) 5-325 MG per tablet Take 1 tablet by mouth at bedtime as needed for moderate pain.  . methocarbamol (ROBAXIN) 500 MG tablet Take 1 tablet (500 mg total) by mouth every 8 (eight) hours as needed for muscle spasms.  . predniSONE (DELTASONE) 10 MG tablet 6 TABLETS DAILY FOR 4 DAYS,  THEN START 10 MG DAILY TAPER  . [DISCONTINUED] predniSONE (DELTASONE) 10 MG tablet 6 TABLETS DAILY FOR 3 DAYS,  THEN START 10 MG DAILY TAPER     No orders of the defined types were placed in this encounter.    No Follow-up on file.

## 2013-11-08 DIAGNOSIS — R29898 Other symptoms and signs involving the musculoskeletal system: Secondary | ICD-10-CM | POA: Insufficient documentation

## 2013-11-08 NOTE — Assessment & Plan Note (Signed)
History of sciatica relevant.  Her weakness on today's exam is parimarily hamstrings,  Comprehensive Neuro exam was otherwise normal.  Symptoms have not progressed since Thursday and she has normal DTRS so will not repeat MRI at this time,  Prednisone taper,  Avoid heavy lifting and bending over .  If not improved in a few days,  MRI.

## 2013-11-08 NOTE — Addendum Note (Signed)
Addended by: Crecencio Mc on: 11/08/2013 09:53 AM   Modules accepted: Level of Service

## 2013-11-10 ENCOUNTER — Other Ambulatory Visit: Payer: Self-pay | Admitting: *Deleted

## 2013-11-10 ENCOUNTER — Ambulatory Visit (INDEPENDENT_AMBULATORY_CARE_PROVIDER_SITE_OTHER): Payer: BC Managed Care – PPO | Admitting: Internal Medicine

## 2013-11-10 ENCOUNTER — Encounter: Payer: Self-pay | Admitting: Internal Medicine

## 2013-11-10 VITALS — BP 130/80 | HR 62 | Temp 98.5°F | Ht 60.0 in | Wt 216.5 lb

## 2013-11-10 DIAGNOSIS — R7309 Other abnormal glucose: Secondary | ICD-10-CM

## 2013-11-10 DIAGNOSIS — R29898 Other symptoms and signs involving the musculoskeletal system: Secondary | ICD-10-CM

## 2013-11-10 DIAGNOSIS — I10 Essential (primary) hypertension: Secondary | ICD-10-CM

## 2013-11-10 DIAGNOSIS — R739 Hyperglycemia, unspecified: Secondary | ICD-10-CM

## 2013-11-10 LAB — BASIC METABOLIC PANEL
BUN: 32 mg/dL — AB (ref 6–23)
CALCIUM: 9.4 mg/dL (ref 8.4–10.5)
CO2: 28 mEq/L (ref 19–32)
CREATININE: 1.2 mg/dL (ref 0.4–1.2)
Chloride: 103 mEq/L (ref 96–112)
GFR: 47.16 mL/min — ABNORMAL LOW (ref >60.00)
Glucose, Bld: 128 mg/dL — ABNORMAL HIGH (ref 70–99)
Potassium: 3.5 mEq/L (ref 3.5–5.1)
Sodium: 139 mEq/L (ref 135–145)

## 2013-11-10 NOTE — Progress Notes (Signed)
Pre-visit discussion using our clinic review tool. No additional management support is needed unless otherwise documented below in the visit note.  

## 2013-11-10 NOTE — Telephone Encounter (Signed)
Ok refill? 

## 2013-11-11 ENCOUNTER — Other Ambulatory Visit: Payer: Self-pay | Admitting: Internal Medicine

## 2013-11-11 DIAGNOSIS — N289 Disorder of kidney and ureter, unspecified: Secondary | ICD-10-CM

## 2013-11-11 NOTE — Progress Notes (Signed)
Orders placed for f/u labs.  

## 2013-11-13 ENCOUNTER — Encounter: Payer: Self-pay | Admitting: Internal Medicine

## 2013-11-13 NOTE — Progress Notes (Signed)
Subjective:    Patient ID: Kristin James, female    DOB: 01-20-1956, 58 y.o.   MRN: [id number]  HPI 58 year old female with past history of hypertension, hyperglycemia and hypercholesterolemia who comes in today for a scheduled follow up.  States she is doing well.  Starting three months ago, she developed some low back pain.   Was seeing chiropractor.  The pain worsened last week.  Had a hard time going up steps.  Had a hard time raising her left leg.  Felt stiff.  No numbness or tingling.  Saw Dr Derrel Nip a few days ago.  Was given prednisone.  Had been having muscle spasms.  Feels some better with the prednisone.  Still with increased pain.  Leg improved but not back to normal.  No chest pain or tightness.  Breathing stable.      Past Medical History  Diagnosis Date  . Hypertension   . Hypercholesteremia   . Hyperglycemia   . Osteoarthritis   . Proteinuria     Current Outpatient Prescriptions on File Prior to Visit  Medication Sig Dispense Refill  . atenolol (TENORMIN) 100 MG tablet Take 1 tablet (100 mg total) by mouth daily.  90 tablet  1  . atorvastatin (LIPITOR) 40 MG tablet Take 1 tablet (40 mg total) by mouth daily.  90 tablet  1  . estrogen-methylTESTOSTERone 0.625-1.25 MG per tablet Take 1 tablet by mouth daily.  30 tablet  3  . felodipine (PLENDIL) 10 MG 24 hr tablet Take 1 tablet (10 mg total) by mouth daily.  30 tablet  5  . HYDROcodone-acetaminophen (NORCO/VICODIN) 5-325 MG per tablet Take 1 tablet by mouth at bedtime as needed for moderate pain.  30 tablet  0  . hyoscyamine (LEVSIN/SL) 0.125 MG SL tablet Place 1 tablet (0.125 mg total) under the tongue every 4 (four) hours as needed for cramping.  20 tablet  3  . losartan-hydrochlorothiazide (HYZAAR) 100-25 MG per tablet Take 1 tablet by mouth daily.  90 tablet  1  . meloxicam (MOBIC) 15 MG tablet Take 1 tablet (15 mg total) by mouth daily.  30 tablet  0  . methocarbamol (ROBAXIN) 500 MG tablet Take 1 tablet (500 mg total) by  mouth every 8 (eight) hours as needed for muscle spasms.  90 tablet  0  . Multiple Vitamins-Minerals (MULTIVITAMIN PO) Take by mouth.      . nitroGLYCERIN (NITROSTAT) 0.4 MG SL tablet Place 1 tablet (0.4 mg total) under the tongue every 5 (five) minutes as needed for chest pain.  25 tablet  3  . pantoprazole (PROTONIX) 40 MG tablet Take 1 tablet (40 mg total) by mouth daily.  30 tablet  5  . predniSONE (DELTASONE) 10 MG tablet 6 TABLETS DAILY FOR 4 DAYS,  THEN START 10 MG DAILY TAPER  39 tablet  0  . progesterone (PROMETRIUM) 100 MG capsule Take 1 capsule (100 mg total) by mouth daily.  90 capsule  1   No current facility-administered medications on file prior to visit.    Review of Systems Patient denies any headache, lightheadedness or dizziness.  No sinus or allergy symptoms.   No chest pain, tightness or palpitations.  No increased shortness of breath, cough or congestion.  No nausea or vomiting.  No acid reflux.  No abdominal pain or cramping.  No bowel change, such as diarrhea, constipation, BRBPR or melana.  No urine change.  Back and leg pain (flare) as outlined.  Objective:   Physical Exam  Filed Vitals:   11/10/13 1059  BP: 130/80  Pulse: 62  Temp: 98.5 F (36.9 C)   Blood pressure recheck:  42/59  58 year old female in no acute distress.  NECK:  Supple.  Nontender.  No audible bruit.  HEART:  Appears to be regular. LUNGS:  No crackles or wheezing audible.  Respirations even and unlabored.  RADIAL PULSE:  Equal bilaterally.  ABDOMEN:  Soft, nontender.  Bowel sounds present and normal.  No audible abdominal bruit.   EXTREMITIES:  No increased edema present.  DP pulses palpable and equal bilaterally.  MSK:  Some increased back pain with ambulation.  Some discomfort with straight leg raise.  Motor strength appears to be equal bilateral lower extremities.           Assessment & Plan:  HEMATOLOGY.  Last H/H stable.  Follow.    CARDIOVASCULAR.  Asymptomatic.     WEIGHT LOSS.  Have discussed the importance of diet and exercise.  Follow.   HEALTH MAINTENANCE.  Physical 01/28/13.  Pap at physical negative with negative HPV.  Mammogram 05/09/13 - BiRADS I.  Colonoscopy 10/10 revealed diverticulosis.  Recommended repeat colonoscopy five years.

## 2013-11-13 NOTE — Assessment & Plan Note (Addendum)
Low carb diet and exercise.  Weight loss.  Check metabolic panel and E1D.  Needs treatment.  Will get her back in soon to reassess - once get back issues resolved.  She was given a glucometer and instructed on proper way to check her sugars.

## 2013-11-13 NOTE — Assessment & Plan Note (Signed)
Back and leg pain as outlined.  Some stiffness and weakness in the left leg as outlined.  See Dr Lupita Dawn note and my note for details.  Has improved with prednisone.  Check MRI L-S spine.  Further w/up pending.

## 2013-11-13 NOTE — Assessment & Plan Note (Signed)
Blood pressure under good control.  Same medication regimen.  Check metabolic panel.    

## 2013-11-15 ENCOUNTER — Ambulatory Visit: Payer: Self-pay | Admitting: Internal Medicine

## 2013-11-18 ENCOUNTER — Other Ambulatory Visit (INDEPENDENT_AMBULATORY_CARE_PROVIDER_SITE_OTHER): Payer: BC Managed Care – PPO

## 2013-11-18 ENCOUNTER — Telehealth: Payer: Self-pay | Admitting: Internal Medicine

## 2013-11-18 DIAGNOSIS — N289 Disorder of kidney and ureter, unspecified: Secondary | ICD-10-CM

## 2013-11-18 LAB — BASIC METABOLIC PANEL
BUN: 25 mg/dL — ABNORMAL HIGH (ref 6–23)
CALCIUM: 9.7 mg/dL (ref 8.4–10.5)
CO2: 28 mEq/L (ref 19–32)
CREATININE: 1.1 mg/dL (ref 0.4–1.2)
Chloride: 99 mEq/L (ref 96–112)
GFR: 52.5 mL/min — ABNORMAL LOW (ref >60.00)
Glucose, Bld: 113 mg/dL — ABNORMAL HIGH (ref 70–99)
Potassium: 3.9 mEq/L (ref 3.5–5.1)
Sodium: 136 mEq/L (ref 135–145)

## 2013-11-18 NOTE — Telephone Encounter (Signed)
Please call pt and notify that she has degenerative disc disease (arthritis) - with prominent impingement on nerve at L5-S1, moderate impingement at L4-5 and mild impingement L3-4.  Given her exam findings and the MRI findings, I would like to refer her to Neurosurgery for further evaluation and treatment.  If agreeable let me know and I will place the order for the referral.  (also let me know if she has a preference of who she wants to see).

## 2013-11-18 NOTE — Telephone Encounter (Signed)
Pt was notified in office of results by Dr. Nicki Reaper. Referral has been placed to see Dr. Carloyn Manner & pt notified

## 2013-11-18 NOTE — Telephone Encounter (Signed)
Report requested

## 2013-11-18 NOTE — Telephone Encounter (Signed)
Report on your desk

## 2013-11-18 NOTE — Telephone Encounter (Signed)
Pt dropped off MRI results.  Placed in Dr. Bary Leriche box.

## 2013-11-18 NOTE — Telephone Encounter (Signed)
Faxed medical release form to [facility] to get mri result  Please call pt

## 2013-11-18 NOTE — Telephone Encounter (Signed)
PT CAME IN FOR LABS TODAY AND WANTED TO GET HER MRI RESULTS PT STATED SHE HAD HER MRI DONE IN [facility] ON Tuesday PLEASE ADVISE PT OF RESULTS

## 2013-11-20 ENCOUNTER — Encounter: Payer: Self-pay | Admitting: Internal Medicine

## 2013-11-21 ENCOUNTER — Telehealth: Payer: Self-pay | Admitting: Emergency Medicine

## 2013-11-21 MED ORDER — MELOXICAM 7.5 MG PO TABS: 7.5000 mg | ORAL_TABLET | Freq: Every day | ORAL | Status: DC | PRN

## 2013-11-21 NOTE — Addendum Note (Signed)
Addended by: Alisa Graff on: 11/21/2013 08:52 AM   Modules accepted: Orders, Medications

## 2013-11-21 NOTE — Telephone Encounter (Signed)
Referral for Dr. Carloyn Manner has been sent to his office for apt.

## 2013-11-21 NOTE — Telephone Encounter (Signed)
Pt called to check status of referral.  Advised pt paperwork has been sent and we are awaiting response from Dr. Rex Kras office for appt date/time.

## 2013-12-06 ENCOUNTER — Ambulatory Visit (INDEPENDENT_AMBULATORY_CARE_PROVIDER_SITE_OTHER): Payer: BC Managed Care – PPO | Admitting: Internal Medicine

## 2013-12-06 ENCOUNTER — Encounter: Payer: Self-pay | Admitting: Internal Medicine

## 2013-12-06 VITALS — BP 120/80 | HR 60 | Temp 97.8°F | Ht 60.0 in | Wt 213.5 lb

## 2013-12-06 DIAGNOSIS — I10 Essential (primary) hypertension: Secondary | ICD-10-CM

## 2013-12-06 DIAGNOSIS — E7800 Pure hypercholesterolemia, unspecified: Secondary | ICD-10-CM

## 2013-12-06 DIAGNOSIS — R7309 Other abnormal glucose: Secondary | ICD-10-CM

## 2013-12-06 DIAGNOSIS — R739 Hyperglycemia, unspecified: Secondary | ICD-10-CM

## 2013-12-06 DIAGNOSIS — R29898 Other symptoms and signs involving the musculoskeletal system: Secondary | ICD-10-CM

## 2013-12-06 NOTE — Progress Notes (Signed)
Pre-visit discussion using our clinic review tool. No additional management support is needed unless otherwise documented below in the visit note.  

## 2013-12-11 ENCOUNTER — Encounter: Payer: Self-pay | Admitting: Internal Medicine

## 2013-12-11 NOTE — Assessment & Plan Note (Signed)
Low carb diet and exercise.  Weight loss.  She has done well.  Has lost weight.  Sugars better.  See attached list for details.  Follow metabolic panel and O8I.

## 2013-12-11 NOTE — Progress Notes (Signed)
Subjective:    Patient ID: Kristin James, female    DOB: July 01, 1956, 58 y.o.   MRN: [id number]  HPI 58 year old female with past history of hypertension, hyperglycemia and hypercholesterolemia who comes in today for a scheduled follow up.  States she is doing well.  Starting three months ago, she developed some low back pain.   Was seeing chiropractor.  The pain worsened recently.  See previous note for details.  Was given prednisone.  Also takes mobic.  Back is better.  She has started walking.  Waiting on an appt with Dr Carloyn Manner.   No chest pain or tightness.  Breathing stable.  Has adjusted her diet.  Is losing weight.  Had MRI that revealed lumbar spondylosis and degenerative disc disease, causing prominent impingement at L5-S1, moderate impingement at L4-5 and mild impingement at L3-4.      Past Medical History  Diagnosis Date  . Hypertension   . Hypercholesteremia   . Hyperglycemia   . Osteoarthritis   . Proteinuria     Current Outpatient Prescriptions on File Prior to Visit  Medication Sig Dispense Refill  . atenolol (TENORMIN) 100 MG tablet Take 1 tablet (100 mg total) by mouth daily.  90 tablet  1  . atorvastatin (LIPITOR) 40 MG tablet Take 1 tablet (40 mg total) by mouth daily.  90 tablet  1  . estrogen-methylTESTOSTERone 0.625-1.25 MG per tablet Take 1 tablet by mouth daily.  30 tablet  3  . felodipine (PLENDIL) 10 MG 24 hr tablet Take 1 tablet (10 mg total) by mouth daily.  30 tablet  5  . hyoscyamine (LEVSIN/SL) 0.125 MG SL tablet Place 1 tablet (0.125 mg total) under the tongue every 4 (four) hours as needed for cramping.  20 tablet  3  . losartan-hydrochlorothiazide (HYZAAR) 100-25 MG per tablet Take 1 tablet by mouth daily.  90 tablet  1  . meloxicam (MOBIC) 7.5 MG tablet Take 1 tablet (7.5 mg total) by mouth daily as needed for pain.  30 tablet  1  . methocarbamol (ROBAXIN) 500 MG tablet Take 1 tablet (500 mg total) by mouth every 8 (eight) hours as needed for muscle spasms.  90  tablet  0  . Multiple Vitamins-Minerals (MULTIVITAMIN PO) Take by mouth.      . nitroGLYCERIN (NITROSTAT) 0.4 MG SL tablet Place 1 tablet (0.4 mg total) under the tongue every 5 (five) minutes as needed for chest pain.  25 tablet  3  . pantoprazole (PROTONIX) 40 MG tablet Take 1 tablet (40 mg total) by mouth daily.  30 tablet  5  . progesterone (PROMETRIUM) 100 MG capsule Take 1 capsule (100 mg total) by mouth daily.  90 capsule  1   No current facility-administered medications on file prior to visit.    Review of Systems Patient denies any headache, lightheadedness or dizziness.  No sinus or allergy symptoms.   No chest pain, tightness or palpitations.  No increased shortness of breath, cough or congestion.  No nausea or vomiting.  No acid reflux.  No abdominal pain or cramping.  No bowel change, such as diarrhea, constipation, BRBPR or melana.  No urine change.  Back and leg pain (flare) as outlined.  Better.  Has adjusted her diet.  Losing weight.       Objective:   Physical Exam  Filed Vitals:   12/06/13 1354  BP: 120/80  Pulse: 60  Temp: 97.8 F (36.6 C)   Blood pressure recheck:  118/78  57 year  old female in no acute distress.  NECK:  Supple.  Nontender.  No audible bruit.  HEART:  Appears to be regular. LUNGS:  No crackles or wheezing audible.  Respirations even and unlabored.  RADIAL PULSE:  Equal bilaterally.  ABDOMEN:  Soft, nontender.  Bowel sounds present and normal.  No audible abdominal bruit.   EXTREMITIES:  No increased edema present.  DP pulses palpable and equal bilaterally.            Assessment & Plan:  HEMATOLOGY.  Last H/H stable.  Follow.    CARDIOVASCULAR.  Asymptomatic.    WEIGHT LOSS.  Have discussed the importance of diet and exercise.  She has adjusted her diet.  Is losing weight.  Follow.    HEALTH MAINTENANCE.  Physical 01/28/13.  Pap at physical negative with negative HPV.  Mammogram 05/09/13 - BiRADS I.  Colonoscopy 10/10 revealed diverticulosis.   Recommended repeat colonoscopy five years.

## 2013-12-11 NOTE — Assessment & Plan Note (Signed)
On lipitor.  Low cholesterol diet and exercise.   Follow lipid panel and liver function.   

## 2013-12-11 NOTE — Assessment & Plan Note (Signed)
Blood pressure under good control.  Same medication regimen.  Check metabolic panel.    

## 2013-12-11 NOTE — Assessment & Plan Note (Signed)
Back and leg pain as outlined.  Better now.  MRI as outlined.  She is walking.  Has lost weight.  Waiting on appt with Dr Carloyn Manner.

## 2013-12-13 ENCOUNTER — Other Ambulatory Visit (INDEPENDENT_AMBULATORY_CARE_PROVIDER_SITE_OTHER): Payer: BC Managed Care – PPO

## 2013-12-13 DIAGNOSIS — E7800 Pure hypercholesterolemia, unspecified: Secondary | ICD-10-CM

## 2013-12-13 DIAGNOSIS — I10 Essential (primary) hypertension: Secondary | ICD-10-CM

## 2013-12-13 DIAGNOSIS — R29898 Other symptoms and signs involving the musculoskeletal system: Secondary | ICD-10-CM

## 2013-12-13 DIAGNOSIS — R7309 Other abnormal glucose: Secondary | ICD-10-CM

## 2013-12-13 DIAGNOSIS — R739 Hyperglycemia, unspecified: Secondary | ICD-10-CM

## 2013-12-13 LAB — HEPATIC FUNCTION PANEL
ALT: 18 U/L (ref 0–35)
AST: 23 U/L (ref 0–37)
Albumin: 4 g/dL (ref 3.5–5.2)
Alkaline Phosphatase: 73 U/L (ref 39–117)
BILIRUBIN DIRECT: 0.1 mg/dL (ref 0.0–0.3)
TOTAL PROTEIN: 7.5 g/dL (ref 6.0–8.3)
Total Bilirubin: 1 mg/dL (ref 0.3–1.2)

## 2013-12-13 LAB — LIPID PANEL
Cholesterol: 135 mg/dL (ref 0–200)
HDL: 29.8 mg/dL — ABNORMAL LOW (ref >39.00)
LDL CALC: 74 mg/dL (ref 0–99)
Total CHOL/HDL Ratio: 5
Triglycerides: 158 mg/dL — ABNORMAL HIGH (ref 0.0–149.0)
VLDL: 31.6 mg/dL (ref 0.0–40.0)

## 2013-12-13 LAB — CBC WITH DIFFERENTIAL/PLATELET
BASOS ABS: 0.1 10*3/uL (ref 0.0–0.1)
Basophils Relative: 0.7 % (ref 0.0–3.0)
Eosinophils Absolute: 0.3 10*3/uL (ref 0.0–0.7)
Eosinophils Relative: 2.7 % (ref 0.0–5.0)
HEMATOCRIT: 49.7 % — AB (ref 36.0–46.0)
HEMOGLOBIN: 16.8 g/dL — AB (ref 12.0–15.0)
LYMPHS ABS: 3.1 10*3/uL (ref 0.7–4.0)
Lymphocytes Relative: 31 % (ref 12.0–46.0)
MCHC: 33.8 g/dL (ref 30.0–36.0)
MCV: 88.7 fl (ref 78.0–100.0)
MONO ABS: 0.8 10*3/uL (ref 0.1–1.0)
MONOS PCT: 8.1 % (ref 3.0–12.0)
NEUTROS ABS: 5.7 10*3/uL (ref 1.4–7.7)
Neutrophils Relative %: 57.5 % (ref 43.0–77.0)
Platelets: 257 10*3/uL (ref 150.0–400.0)
RBC: 5.6 Mil/uL — ABNORMAL HIGH (ref 3.87–5.11)
RDW: 13.1 % (ref 11.5–14.6)
WBC: 9.9 10*3/uL (ref 4.5–10.5)

## 2013-12-13 LAB — MICROALBUMIN / CREATININE URINE RATIO
Creatinine,U: 160.7 mg/dL
Microalb Creat Ratio: 58.1 mg/g — ABNORMAL HIGH (ref 0.0–30.0)
Microalb, Ur: 93.4 mg/dL — ABNORMAL HIGH (ref 0.0–1.9)

## 2013-12-13 LAB — BASIC METABOLIC PANEL
BUN: 32 mg/dL — ABNORMAL HIGH (ref 6–23)
CO2: 27 mEq/L (ref 19–32)
CREATININE: 1.3 mg/dL — AB (ref 0.4–1.2)
Calcium: 9.6 mg/dL (ref 8.4–10.5)
Chloride: 103 mEq/L (ref 96–112)
GFR: 45.45 mL/min — ABNORMAL LOW (ref >60.00)
GLUCOSE: 161 mg/dL — AB (ref 70–99)
POTASSIUM: 3.8 meq/L (ref 3.5–5.1)
Sodium: 139 mEq/L (ref 135–145)

## 2013-12-13 LAB — HEMOGLOBIN A1C: HEMOGLOBIN A1C: 6.9 % — AB (ref 4.6–6.5)

## 2013-12-14 ENCOUNTER — Other Ambulatory Visit: Payer: Self-pay | Admitting: Internal Medicine

## 2013-12-14 DIAGNOSIS — N289 Disorder of kidney and ureter, unspecified: Secondary | ICD-10-CM

## 2013-12-14 DIAGNOSIS — D582 Other hemoglobinopathies: Secondary | ICD-10-CM

## 2013-12-14 NOTE — Progress Notes (Signed)
Order placed for f/u labs.  

## 2013-12-15 ENCOUNTER — Encounter: Payer: Self-pay | Admitting: *Deleted

## 2013-12-19 ENCOUNTER — Other Ambulatory Visit (INDEPENDENT_AMBULATORY_CARE_PROVIDER_SITE_OTHER): Payer: BC Managed Care – PPO

## 2013-12-19 DIAGNOSIS — N289 Disorder of kidney and ureter, unspecified: Secondary | ICD-10-CM

## 2013-12-19 DIAGNOSIS — D582 Other hemoglobinopathies: Secondary | ICD-10-CM

## 2013-12-19 LAB — CBC WITH DIFFERENTIAL/PLATELET
BASOS PCT: 0.6 % (ref 0.0–3.0)
Basophils Absolute: 0.1 10*3/uL (ref 0.0–0.1)
EOS PCT: 1.9 % (ref 0.0–5.0)
Eosinophils Absolute: 0.2 10*3/uL (ref 0.0–0.7)
HEMATOCRIT: 45.2 % (ref 36.0–46.0)
Hemoglobin: 15.3 g/dL — ABNORMAL HIGH (ref 12.0–15.0)
LYMPHS ABS: 2.9 10*3/uL (ref 0.7–4.0)
Lymphocytes Relative: 32.3 % (ref 12.0–46.0)
MCHC: 33.9 g/dL (ref 30.0–36.0)
MCV: 89.4 fl (ref 78.0–100.0)
Monocytes Absolute: 0.8 10*3/uL (ref 0.1–1.0)
Monocytes Relative: 8.9 % (ref 3.0–12.0)
Neutro Abs: 5.1 10*3/uL (ref 1.4–7.7)
Neutrophils Relative %: 56.3 % (ref 43.0–77.0)
Platelets: 260 10*3/uL (ref 150.0–400.0)
RBC: 5.05 Mil/uL (ref 3.87–5.11)
RDW: 12.7 % (ref 11.5–14.6)
WBC: 9.1 10*3/uL (ref 4.5–10.5)

## 2013-12-19 LAB — BASIC METABOLIC PANEL
BUN: 17 mg/dL (ref 6–23)
CALCIUM: 9.3 mg/dL (ref 8.4–10.5)
CO2: 27 mEq/L (ref 19–32)
Chloride: 99 mEq/L (ref 96–112)
Creatinine, Ser: 1 mg/dL (ref 0.4–1.2)
GFR: 58.4 mL/min — ABNORMAL LOW (ref >60.00)
GLUCOSE: 110 mg/dL — AB (ref 70–99)
Potassium: 3.8 mEq/L (ref 3.5–5.1)
Sodium: 137 mEq/L (ref 135–145)

## 2013-12-19 NOTE — Telephone Encounter (Signed)
I called pt & left a detail message on her cell phone to inform her of the continent of the unread mychart message since she is scheduled to come in for labs today @ 3pm.

## 2013-12-20 ENCOUNTER — Encounter: Payer: Self-pay | Admitting: Internal Medicine

## 2013-12-26 ENCOUNTER — Other Ambulatory Visit: Payer: Self-pay | Admitting: *Deleted

## 2013-12-26 MED ORDER — EST ESTROGENS-METHYLTEST 0.625-1.25 MG PO TABS: 1.0000 | ORAL_TABLET | Freq: Every day | ORAL | Status: DC

## 2013-12-28 ENCOUNTER — Other Ambulatory Visit: Payer: Self-pay | Admitting: Internal Medicine

## 2013-12-28 MED ORDER — ATORVASTATIN CALCIUM 40 MG PO TABS: 40.0000 mg | ORAL_TABLET | Freq: Every day | ORAL | Status: DC

## 2013-12-28 NOTE — Telephone Encounter (Signed)
Prescription esribed to pharmacy

## 2014-01-30 ENCOUNTER — Other Ambulatory Visit: Payer: Self-pay | Admitting: *Deleted

## 2014-01-30 MED ORDER — EST ESTROGENS-METHYLTEST 0.625-1.25 MG PO TABS: 1.0000 | ORAL_TABLET | Freq: Every day | ORAL | Status: DC

## 2014-01-30 NOTE — Telephone Encounter (Signed)
Rx faxed to pharmacy  

## 2014-01-30 NOTE — Telephone Encounter (Signed)
Refilled # 30 with 2 refills. 

## 2014-01-30 NOTE — Telephone Encounter (Signed)
Ok refill? 

## 2014-02-10 ENCOUNTER — Encounter: Payer: BC Managed Care – PPO | Admitting: Internal Medicine

## 2014-02-13 ENCOUNTER — Other Ambulatory Visit: Payer: Self-pay | Admitting: *Deleted

## 2014-02-13 ENCOUNTER — Encounter: Payer: Self-pay | Admitting: Internal Medicine

## 2014-02-13 ENCOUNTER — Ambulatory Visit (INDEPENDENT_AMBULATORY_CARE_PROVIDER_SITE_OTHER): Payer: BC Managed Care – PPO | Admitting: Internal Medicine

## 2014-02-13 VITALS — BP 110/80 | HR 52 | Temp 98.1°F | Ht 60.0 in | Wt 215.5 lb

## 2014-02-13 DIAGNOSIS — M5440 Lumbago with sciatica, unspecified side: Secondary | ICD-10-CM

## 2014-02-13 DIAGNOSIS — M5430 Sciatica, unspecified side: Secondary | ICD-10-CM

## 2014-02-13 DIAGNOSIS — E7800 Pure hypercholesterolemia, unspecified: Secondary | ICD-10-CM

## 2014-02-13 DIAGNOSIS — R739 Hyperglycemia, unspecified: Secondary | ICD-10-CM

## 2014-02-13 DIAGNOSIS — I10 Essential (primary) hypertension: Secondary | ICD-10-CM

## 2014-02-13 DIAGNOSIS — R7309 Other abnormal glucose: Secondary | ICD-10-CM

## 2014-02-13 DIAGNOSIS — D582 Other hemoglobinopathies: Secondary | ICD-10-CM

## 2014-02-13 MED ORDER — FELODIPINE ER 10 MG PO TB24: 10.0000 mg | ORAL_TABLET | Freq: Every day | ORAL | Status: DC

## 2014-02-13 MED ORDER — MELOXICAM 15 MG PO TABS: 15.0000 mg | ORAL_TABLET | Freq: Every day | ORAL | Status: DC

## 2014-02-13 NOTE — Progress Notes (Signed)
Pre visit review using our clinic review tool, if applicable. No additional management support is needed unless otherwise documented below in the visit note. 

## 2014-02-25 ENCOUNTER — Encounter: Payer: Self-pay | Admitting: Internal Medicine

## 2014-02-25 DIAGNOSIS — M549 Dorsalgia, unspecified: Secondary | ICD-10-CM | POA: Insufficient documentation

## 2014-02-25 NOTE — Assessment & Plan Note (Signed)
On lipitor.  Low cholesterol diet and exercise.   Follow lipid panel and liver function.

## 2014-02-25 NOTE — Assessment & Plan Note (Signed)
Blood pressure under good control.  Same medication regimen.  Follow metabolic panel.   

## 2014-02-25 NOTE — Assessment & Plan Note (Signed)
MRI as outlined.  Back and leg pain better.  Follow.

## 2014-02-25 NOTE — Assessment & Plan Note (Signed)
Low carb diet and exercise.  Weight loss.  She has done well.   Sugars better.  See attached list for details.  Follow metabolic panel and V2Z.

## 2014-02-25 NOTE — Progress Notes (Signed)
Subjective:    Patient ID: Kristin James, female    DOB: 10-04-1955, 58 y.o.   MRN: [id number]  HPI 58 year old female with past history of hypertension, hyperglycemia and hypercholesterolemia who comes in today for a scheduled follow up.  States she is doing well.   Back is better.  Trying to stay active.   No chest pain or tightness.  Breathing stable.  Has adjusted her diet.  Had MRI that revealed lumbar spondylosis and degenerative disc disease, causing prominent impingement at L5-S1, moderate impingement at L4-5 and mild impingement at L3-4.      Past Medical History  Diagnosis Date  . Hypertension   . Hypercholesteremia   . Hyperglycemia   . Osteoarthritis   . Proteinuria     Current Outpatient Prescriptions on File Prior to Visit  Medication Sig Dispense Refill  . atenolol (TENORMIN) 100 MG tablet Take 1 tablet (100 mg total) by mouth daily.  90 tablet  1  . atorvastatin (LIPITOR) 40 MG tablet Take 1 tablet (40 mg total) by mouth daily.  90 tablet  1  . estrogen-methylTESTOSTERone 0.625-1.25 MG per tablet Take 1 tablet by mouth daily.  30 tablet  2  . hyoscyamine (LEVSIN/SL) 0.125 MG SL tablet Place 1 tablet (0.125 mg total) under the tongue every 4 (four) hours as needed for cramping.  20 tablet  3  . losartan-hydrochlorothiazide (HYZAAR) 100-25 MG per tablet Take 1 tablet by mouth daily.  90 tablet  1  . methocarbamol (ROBAXIN) 500 MG tablet Take 1 tablet (500 mg total) by mouth every 8 (eight) hours as needed for muscle spasms.  90 tablet  0  . Multiple Vitamins-Minerals (MULTIVITAMIN PO) Take by mouth.      . nitroGLYCERIN (NITROSTAT) 0.4 MG SL tablet Place 1 tablet (0.4 mg total) under the tongue every 5 (five) minutes as needed for chest pain.  25 tablet  3  . pantoprazole (PROTONIX) 40 MG tablet Take 1 tablet (40 mg total) by mouth daily.  30 tablet  5  . progesterone (PROMETRIUM) 100 MG capsule Take 1 capsule (100 mg total) by mouth daily.  90 capsule  1   No current  facility-administered medications on file prior to visit.    Review of Systems Patient denies any headache, lightheadedness or dizziness.  No sinus or allergy symptoms.   No chest pain, tightness or palpitations.  No increased shortness of breath, cough or congestion.  No nausea or vomiting.  No acid reflux.  No abdominal pain or cramping.  No bowel change, such as diarrhea, constipation, BRBPR or melana.  No urine change.  Back and leg pain (flare) as outlined.  Better.  Has adjusted her diet.  AM sugars averaging 116-124.        Objective:   Physical Exam  Filed Vitals:   02/13/14 1425  BP: 110/80  Pulse: 52  Temp: 98.1 F (36.7 C)   Blood pressure recheck:  128/80, pulse 63  58 year old female in no acute distress.   HEENT:  Nares- clear.  Oropharynx - without lesions. NECK:  Supple.  Nontender.  No audible bruit.  HEART:  Appears to be regular. LUNGS:  No crackles or wheezing audible.  Respirations even and unlabored.  RADIAL PULSE:  Equal bilaterally.    BREASTS:  No nipple discharge or nipple retraction present.  Could not appreciate any distinct nodules or axillary adenopathy.  ABDOMEN:  Soft, nontender.  Bowel sounds present and normal.  No audible abdominal  bruit.  GU:  Not performed.     EXTREMITIES:  No increased edema present.  DP pulses palpable and equal bilaterally.      FEET:  No lesions.       Assessment & Plan:  HEMATOLOGY.  Last H/H stable.  Follow.    CARDIOVASCULAR.  Asymptomatic.    WEIGHT LOSS.  Have discussed the importance of diet and exercise.  She has adjusted her diet.    HEALTH MAINTENANCE.  Physical today.  Pap at last physical negative with negative HPV.  Mammogram 05/09/13 - BiRADS I.  Colonoscopy 10/10 revealed diverticulosis.  Recommended repeat colonoscopy five years.    I spent 25 minutes with the patient and more than 50% of the time was spent in consultation regarding the above.

## 2014-02-27 ENCOUNTER — Telehealth: Payer: Self-pay

## 2014-02-27 DIAGNOSIS — M545 Low back pain: Secondary | ICD-10-CM

## 2014-02-27 NOTE — Telephone Encounter (Signed)
The patient called and is hoping to get a new referral to a Chief of Staff.  She stated she had an apt with Dr.Roy, but canceled it.  She states she now is hoping to have surgery on her back.  Do you want her seen in the office 1st? Thanks!

## 2014-02-27 NOTE — Telephone Encounter (Signed)
I have already seen her for this.  She had MRI on 11/15/13.  Has seen Dr Carloyn Manner in the past.  I was told previously that we had sent his office the information and were just waiting on a call back about an appt.  I am ok with proceeding with referral.  Pt previously had back/leg pain, foot drop, etc.  Let me know what I need to do.

## 2014-02-28 NOTE — Telephone Encounter (Signed)
Order placed for referral.  

## 2014-02-28 NOTE — Telephone Encounter (Signed)
Can a new ref be placed for this order? Thanks so much!!

## 2014-02-28 NOTE — Telephone Encounter (Signed)
The patient would like to go to [place] [phone] to be seen by Hosie Spangle or Zigmund Daniel. [place].

## 2014-03-10 ENCOUNTER — Telehealth: Payer: Self-pay | Admitting: Internal Medicine

## 2014-03-10 ENCOUNTER — Encounter: Payer: Self-pay | Admitting: Internal Medicine

## 2014-03-10 NOTE — Telephone Encounter (Signed)
Pt sent me a my chart message about an appt with Dr Carloyn Manner.  (can refer to her message).  It appears the referral is in the system.  Do you mind giving her an update on the appt.  Let me know if I need to do something more.  Thanks.    Dr Nicki Reaper

## 2014-03-13 ENCOUNTER — Other Ambulatory Visit: Payer: Self-pay | Admitting: *Deleted

## 2014-03-13 MED ORDER — PANTOPRAZOLE SODIUM 40 MG PO TBEC: 40.0000 mg | DELAYED_RELEASE_TABLET | Freq: Every day | ORAL | Status: DC

## 2014-03-27 ENCOUNTER — Other Ambulatory Visit: Payer: Self-pay | Admitting: *Deleted

## 2014-03-27 MED ORDER — ATENOLOL 100 MG PO TABS: 100.0000 mg | ORAL_TABLET | Freq: Every day | ORAL | Status: DC

## 2014-03-27 MED ORDER — PROGESTERONE MICRONIZED 100 MG PO CAPS: 100.0000 mg | ORAL_CAPSULE | Freq: Every day | ORAL | Status: DC

## 2014-03-27 MED ORDER — LOSARTAN POTASSIUM-HCTZ 100-25 MG PO TABS: 1.0000 | ORAL_TABLET | Freq: Every day | ORAL | Status: DC

## 2014-04-10 ENCOUNTER — Other Ambulatory Visit: Payer: Self-pay | Admitting: *Deleted

## 2014-04-10 MED ORDER — MELOXICAM 15 MG PO TABS: 15.0000 mg | ORAL_TABLET | Freq: Every day | ORAL | Status: DC

## 2014-04-12 ENCOUNTER — Telehealth: Payer: Self-pay | Admitting: Internal Medicine

## 2014-04-12 NOTE — Telephone Encounter (Signed)
Sent pt a my chart message regarding Dr Roy's appt.

## 2014-04-12 NOTE — Telephone Encounter (Signed)
Sent pt a my chart message regarding f/u on appt (to see if has been).

## 2014-04-14 NOTE — Telephone Encounter (Signed)
Unread mychart message mailed to patient 

## 2014-04-17 ENCOUNTER — Encounter: Payer: Self-pay | Admitting: Internal Medicine

## 2014-04-25 ENCOUNTER — Other Ambulatory Visit: Payer: Self-pay | Admitting: *Deleted

## 2014-04-25 MED ORDER — EST ESTROGENS-METHYLTEST 0.625-1.25 MG PO TABS: 1.0000 | ORAL_TABLET | Freq: Every day | ORAL | Status: DC

## 2014-04-26 ENCOUNTER — Encounter: Payer: Self-pay | Admitting: *Deleted

## 2014-04-28 NOTE — Telephone Encounter (Signed)
Unread mychart message mailed to patient 

## 2014-05-03 ENCOUNTER — Encounter: Payer: Self-pay | Admitting: Internal Medicine

## 2014-05-03 ENCOUNTER — Ambulatory Visit (INDEPENDENT_AMBULATORY_CARE_PROVIDER_SITE_OTHER): Payer: BC Managed Care – PPO | Admitting: Internal Medicine

## 2014-05-03 VITALS — BP 124/70 | HR 58 | Temp 98.1°F | Ht 60.0 in | Wt 215.8 lb

## 2014-05-03 DIAGNOSIS — M5430 Sciatica, unspecified side: Secondary | ICD-10-CM

## 2014-05-03 DIAGNOSIS — Z23 Encounter for immunization: Secondary | ICD-10-CM

## 2014-05-03 DIAGNOSIS — R7309 Other abnormal glucose: Secondary | ICD-10-CM

## 2014-05-03 DIAGNOSIS — R739 Hyperglycemia, unspecified: Secondary | ICD-10-CM

## 2014-05-03 DIAGNOSIS — M5440 Lumbago with sciatica, unspecified side: Secondary | ICD-10-CM

## 2014-05-03 DIAGNOSIS — I10 Essential (primary) hypertension: Secondary | ICD-10-CM

## 2014-05-03 MED ORDER — PREDNISONE 10 MG PO TABS: ORAL_TABLET | ORAL | Status: DC

## 2014-05-03 MED ORDER — CLOTRIMAZOLE-BETAMETHASONE 1-0.05 % EX CREA: 1.0000 "application " | TOPICAL_CREAM | Freq: Two times a day (BID) | CUTANEOUS | Status: DC

## 2014-05-03 MED ORDER — HYDROCODONE-ACETAMINOPHEN 5-325 MG PO TABS: 1.0000 | ORAL_TABLET | Freq: Two times a day (BID) | ORAL | Status: DC | PRN

## 2014-05-03 NOTE — Progress Notes (Signed)
Pre visit review using our clinic review tool, if applicable. No additional management support is needed unless otherwise documented below in the visit note. 

## 2014-05-04 ENCOUNTER — Encounter: Payer: Self-pay | Admitting: Internal Medicine

## 2014-05-04 NOTE — Assessment & Plan Note (Signed)
Sugars as outlined.  Doing better.  Monitor while on prednisone.  Stay hydrated.

## 2014-05-04 NOTE — Assessment & Plan Note (Signed)
Back, hip and leg pain as outlined.  Exam and MRI as outlined.  Increased pain recently.  Affecting her activity.  Will treat with prednisone taper as directed.  Instructed her to monitor sugars given prednisone.  Keep appt with Dr Carloyn Manner.  Refilled hydrocodone #30 with no refills.

## 2014-05-04 NOTE — Progress Notes (Signed)
Subjective:    Patient ID: Kristin James, female    DOB: August 12, 1956, 58 y.o.   MRN: [id number]  Back Pain  58 year old female with past history of hypertension, hyperglycemia and hypercholesterolemia who comes in today as a work in with concerns regarding increased pain in her right leg extending into her calf.  She has had back issues.  Worsened again recently.  Affecting her activity.  Hard for her to walk.  If she sits or lies down for too long - pain worse.  Needs something to help with pain.  Still has a few hydrocodone left from a few months ago.  Rarely takes.   States she is doing well otherwise.   No chest pain or tightness.  Breathing stable.   Had MRI that revealed lumbar spondylosis and degenerative disc disease, causing prominent impingement at L5-S1, moderate impingement at L4-5 and mild impingement at L3-4.   Due to see Dr Carloyn Manner in approximately 10 days.     Past Medical History  Diagnosis Date  . Hypertension   . Hypercholesteremia   . Hyperglycemia   . Osteoarthritis   . Proteinuria     Current Outpatient Prescriptions on File Prior to Visit  Medication Sig Dispense Refill  . atenolol (TENORMIN) 100 MG tablet Take 1 tablet (100 mg total) by mouth daily.  90 tablet  1  . atorvastatin (LIPITOR) 40 MG tablet Take 1 tablet (40 mg total) by mouth daily.  90 tablet  1  . estrogen-methylTESTOSTERone 0.625-1.25 MG per tablet Take 1 tablet by mouth daily.  30 tablet  2  . felodipine (PLENDIL) 10 MG 24 hr tablet Take 1 tablet (10 mg total) by mouth daily.  30 tablet  5  . hyoscyamine (LEVSIN/SL) 0.125 MG SL tablet Place 1 tablet (0.125 mg total) under the tongue every 4 (four) hours as needed for cramping.  20 tablet  3  . losartan-hydrochlorothiazide (HYZAAR) 100-25 MG per tablet Take 1 tablet by mouth daily.  90 tablet  1  . meloxicam (MOBIC) 15 MG tablet Take 1 tablet (15 mg total) by mouth daily.  30 tablet  1  . methocarbamol (ROBAXIN) 500 MG tablet Take 1 tablet (500 mg total)  by mouth every 8 (eight) hours as needed for muscle spasms.  90 tablet  0  . Multiple Vitamins-Minerals (MULTIVITAMIN PO) Take by mouth.      . nitroGLYCERIN (NITROSTAT) 0.4 MG SL tablet Place 1 tablet (0.4 mg total) under the tongue every 5 (five) minutes as needed for chest pain.  25 tablet  3  . pantoprazole (PROTONIX) 40 MG tablet Take 1 tablet (40 mg total) by mouth daily.  30 tablet  5  . progesterone (PROMETRIUM) 100 MG capsule Take 1 capsule (100 mg total) by mouth daily.  90 capsule  1   No current facility-administered medications on file prior to visit.    Review of Systems  Musculoskeletal: Positive for back pain.  Patient denies any headache, lightheadedness or dizziness.  No sinus or allergy symptoms.   No chest pain, tightness or palpitations.  No increased shortness of breath, cough or congestion.  No nausea or vomiting.  No acid reflux.  Reports the back, hip and leg pain as outlined.  No numbness or tingling.  Increased pain.  Worsened over the last few weeks.  Sugars averaging 110-130s.  She does report a persistent "rash" over her right ankle.  She thought was poison oak.  Has been present for several weeks.  Objective:   Physical Exam  Filed Vitals:   05/03/14 1133  BP: 124/70  Pulse: 58  Temp: 98.1 F (36.87 C)   58 year old female in no acute distress. NECK:  Supple.  Nontender.  No audible bruit.  HEART:  Appears to be regular. LUNGS:  No crackles or wheezing audible.  Respirations even and unlabored.   ABDOMEN:  Soft, nontender.  Bowel sounds present and normal.  No audible abdominal bruit.   EXTREMITIES:  No increased edema present.  DP pulses palpable and equal bilaterally.      SKIN:  Dry scaling area right ankle.  Question if contact dermatitis vs yeast, etc.   MSK:  Some pulling sensation with straight leg raise.  Increased pain in leg with full extension (right > left).  No motor weakness appreciated.       Assessment & Plan:  HEMATOLOGY.  Last  H/H stable.  Follow.    CARDIOVASCULAR.  Asymptomatic.    WEIGHT LOSS.  Have discussed the importance of diet and exercise.  She has adjusted her diet.    HEALTH MAINTENANCE.  Physical last visit.  Pap at last physical negative with negative HPV.  Mammogram 05/09/13 - BiRADS I.  Colonoscopy 10/10 revealed diverticulosis.  Recommended repeat colonoscopy five years.

## 2014-05-04 NOTE — Assessment & Plan Note (Signed)
Blood pressure under good control.  Same medication regimen.  Follow metabolic panel.   

## 2014-05-15 ENCOUNTER — Ambulatory Visit: Payer: Self-pay | Admitting: Internal Medicine

## 2014-05-15 LAB — HM MAMMOGRAPHY: HM MAMMO: NEGATIVE

## 2014-05-16 ENCOUNTER — Encounter: Payer: Self-pay | Admitting: *Deleted

## 2014-05-20 LAB — CBC WITH DIFFERENTIAL/PLATELET
BASOS PCT: 0.8 %
Basophil #: 0.1 10*3/uL (ref 0.0–0.1)
EOS ABS: 0.2 10*3/uL (ref 0.0–0.7)
Eosinophil %: 1.4 %
HCT: 42.8 % (ref 35.0–47.0)
HGB: 14.6 g/dL (ref 12.0–16.0)
LYMPHS PCT: 14.7 %
Lymphocyte #: 2.2 10*3/uL (ref 1.0–3.6)
MCH: 30.2 pg (ref 26.0–34.0)
MCHC: 34.1 g/dL (ref 32.0–36.0)
MCV: 89 fL (ref 80–100)
Monocyte #: 0.9 x10 3/mm (ref 0.2–0.9)
Monocyte %: 5.7 %
Neutrophil #: 11.7 10*3/uL — ABNORMAL HIGH (ref 1.4–6.5)
Neutrophil %: 77.4 %
Platelet: 180 10*3/uL (ref 150–440)
RBC: 4.82 10*6/uL (ref 3.80–5.20)
RDW: 13.3 % (ref 11.5–14.5)
WBC: 15.1 10*3/uL — ABNORMAL HIGH (ref 3.6–11.0)

## 2014-05-20 LAB — BASIC METABOLIC PANEL
ANION GAP: 13 (ref 7–16)
BUN: 43 mg/dL — ABNORMAL HIGH (ref 7–18)
CO2: 22 mmol/L (ref 21–32)
Calcium, Total: 8.6 mg/dL (ref 8.5–10.1)
Chloride: 108 mmol/L — ABNORMAL HIGH (ref 98–107)
Creatinine: 1.99 mg/dL — ABNORMAL HIGH (ref 0.60–1.30)
EGFR (African American): 31 — ABNORMAL LOW
GFR CALC NON AF AMER: 27 — AB
Glucose: 136 mg/dL — ABNORMAL HIGH (ref 65–99)
OSMOLALITY: 298 (ref 275–301)
Potassium: 3.2 mmol/L — ABNORMAL LOW (ref 3.5–5.1)
Sodium: 143 mmol/L (ref 136–145)

## 2014-05-20 LAB — APTT: Activated PTT: 23.1 secs — ABNORMAL LOW (ref 23.6–35.9)

## 2014-05-20 LAB — PROTIME-INR
INR: 1
Prothrombin Time: 12.6 secs (ref 11.5–14.7)

## 2014-05-21 ENCOUNTER — Inpatient Hospital Stay: Payer: Self-pay | Admitting: Internal Medicine

## 2014-05-21 LAB — HEPARIN LEVEL (UNFRACTIONATED)
Anti-Xa(Unfractionated): 0.77 IU/mL — ABNORMAL HIGH (ref 0.30–0.70)
Anti-Xa(Unfractionated): 0.69 IU/mL (ref 0.30–0.70)

## 2014-05-22 LAB — CBC WITH DIFFERENTIAL/PLATELET
BASOS ABS: 0.1 10*3/uL (ref 0.0–0.1)
Basophil %: 0.9 %
EOS ABS: 0.2 10*3/uL (ref 0.0–0.7)
EOS PCT: 1.8 %
HCT: 38.9 % (ref 35.0–47.0)
HGB: 12.6 g/dL (ref 12.0–16.0)
LYMPHS ABS: 1.9 10*3/uL (ref 1.0–3.6)
Lymphocyte %: 15.2 %
MCH: 29.1 pg (ref 26.0–34.0)
MCHC: 32.4 g/dL (ref 32.0–36.0)
MCV: 90 fL (ref 80–100)
MONOS PCT: 6.4 %
Monocyte #: 0.8 x10 3/mm (ref 0.2–0.9)
NEUTROS ABS: 9.3 10*3/uL — AB (ref 1.4–6.5)
NEUTROS PCT: 75.7 %
Platelet: 138 10*3/uL — ABNORMAL LOW (ref 150–440)
RBC: 4.33 10*6/uL (ref 3.80–5.20)
RDW: 13.2 % (ref 11.5–14.5)
WBC: 12.3 10*3/uL — AB (ref 3.6–11.0)

## 2014-05-22 LAB — BASIC METABOLIC PANEL
Anion Gap: 9 (ref 7–16)
BUN: 18 mg/dL (ref 7–18)
CALCIUM: 7.2 mg/dL — AB (ref 8.5–10.1)
CO2: 17 mmol/L — AB (ref 21–32)
Chloride: 115 mmol/L — ABNORMAL HIGH (ref 98–107)
Creatinine: 1 mg/dL (ref 0.60–1.30)
EGFR (African American): >60
GLUCOSE: 117 mg/dL — AB (ref 65–99)
Osmolality: 284 (ref 275–301)
Potassium: 4.2 mmol/L (ref 3.5–5.1)
Sodium: 141 mmol/L (ref 136–145)

## 2014-05-22 LAB — PLATELET COUNT: Platelet: 134 10*3/uL — ABNORMAL LOW (ref 150–440)

## 2014-05-22 LAB — HEMOGLOBIN: HGB: 13.2 g/dL (ref 12.0–16.0)

## 2014-05-22 LAB — HEPARIN LEVEL (UNFRACTIONATED): ANTI-XA(UNFRACTIONATED): 0.62 [IU]/mL (ref 0.30–0.70)

## 2014-05-23 LAB — PROTIME-INR: PROTIME: 12.6 s (ref 10.0–13.8)

## 2014-05-23 LAB — BASIC METABOLIC PANEL
Anion Gap: 7 (ref 7–16)
BUN: 16 mg/dL (ref 4–21)
BUN: 16 mg/dL (ref 7–18)
CO2: 21 mmol/L (ref 21–32)
CREATININE: 1.1 mg/dL (ref 0.5–1.1)
Calcium, Total: 7.4 mg/dL — ABNORMAL LOW (ref 8.5–10.1)
Chloride: 114 mmol/L — ABNORMAL HIGH (ref 98–107)
Creatinine: 1.07 mg/dL (ref 0.60–1.30)
EGFR (Non-African Amer.): 57 — ABNORMAL LOW
Glucose: 90 mg/dL
Glucose: 90 mg/dL (ref 65–99)
Osmolality: 284 (ref 275–301)
POTASSIUM: 3.7 mmol/L (ref 3.4–5.3)
Potassium: 3.7 mmol/L (ref 3.5–5.1)
Sodium: 142 mmol/L (ref 137–147)
Sodium: 142 mmol/L (ref 136–145)

## 2014-05-23 LAB — POCT INR: INR: 1 (ref 0.9–1.1)

## 2014-05-23 LAB — CBC WITH DIFFERENTIAL/PLATELET
BASOS ABS: 0.1 10*3/uL (ref 0.0–0.1)
Basophil %: 0.6 %
EOS PCT: 1.7 %
Eosinophil #: 0.2 10*3/uL (ref 0.0–0.7)
HCT: 37 % (ref 35.0–47.0)
HGB: 12.5 g/dL (ref 12.0–16.0)
LYMPHS PCT: 16.8 %
Lymphocyte #: 2.1 10*3/uL (ref 1.0–3.6)
MCH: 30.6 pg (ref 26.0–34.0)
MCHC: 33.9 g/dL (ref 32.0–36.0)
MCV: 90 fL (ref 80–100)
MONOS PCT: 7.8 %
Monocyte #: 1 x10 3/mm — ABNORMAL HIGH (ref 0.2–0.9)
NEUTROS ABS: 9.3 10*3/uL — AB (ref 1.4–6.5)
NEUTROS PCT: 73.1 %
PLATELETS: 121 10*3/uL — AB (ref 150–440)
RBC: 4.11 10*6/uL (ref 3.80–5.20)
RDW: 13.5 % (ref 11.5–14.5)
WBC: 12.8 10*3/uL — AB (ref 3.6–11.0)

## 2014-05-23 LAB — CBC AND DIFFERENTIAL
HCT: 37 % (ref 36–46)
HEMOGLOBIN: 12.5 g/dL (ref 12.0–16.0)
PLATELETS: 121 10*3/uL — AB (ref 150–399)
WBC: 12.8 10^3/mL

## 2014-05-23 LAB — HEPARIN LEVEL (UNFRACTIONATED): ANTI-XA(UNFRACTIONATED): 0.53 [IU]/mL (ref 0.30–0.70)

## 2014-05-24 ENCOUNTER — Encounter: Payer: Self-pay | Admitting: Internal Medicine

## 2014-05-24 ENCOUNTER — Telehealth: Payer: Self-pay | Admitting: *Deleted

## 2014-05-24 NOTE — Telephone Encounter (Signed)
Noted.  Will review all records when available.

## 2014-05-24 NOTE — Telephone Encounter (Signed)
Pt dropped of hospital records. Was discharged on: 05/23/14-need f/u in 1-2 weeks. Scheduled hospital f/u on: 06/01/14 (Thu) @ 10:00 AM. Will forward message to Almyra Free for f/u phone call & pt will be notified of appt at that time. (Pt was seen for acute kidney injury, peripheral vascular disease, & peripheral arterial disease per records.). Additional Hospital notes & d/c summary requested.

## 2014-05-24 NOTE — Telephone Encounter (Signed)
Records placed in green folder

## 2014-05-24 NOTE — Telephone Encounter (Signed)
Will review records

## 2014-05-25 ENCOUNTER — Telehealth: Payer: Self-pay | Admitting: *Deleted

## 2014-05-25 NOTE — Telephone Encounter (Signed)
Discharged: 05/23/14  Transition Care Management Follow-up Telephone Call  How have you been since you were released from the hospital? Pt states " Doing good"    Do you understand why you were in the hospital? YES   Do you understand the discharge instrcutions? YES  Items Reviewed:  Medications reviewed: YES, stopped Estrogen and Progesterone; Started Eliquis   Allergies reviewed: No  Dietary changes reviewed: N/A  Referrals reviewed: N/A   Functional Questionnaire:   Activities of Daily Living (ADLs):  N/A She states they are independent in the following: States they require assistance with the following:    Any transportation issues/concerns?: NO   Any patient concerns? NO   Confirmed importance and date/time of follow-up visits scheduled: YES, Oct 1st @ 10:00   Confirmed with patient if condition begins to worsen call PCP or go to the ER.  Patient was given the Call-a-Nurse line [phone]: YES

## 2014-06-01 ENCOUNTER — Ambulatory Visit (INDEPENDENT_AMBULATORY_CARE_PROVIDER_SITE_OTHER): Payer: BC Managed Care – PPO | Admitting: Internal Medicine

## 2014-06-01 ENCOUNTER — Encounter: Payer: Self-pay | Admitting: Internal Medicine

## 2014-06-01 VITALS — BP 130/70 | HR 66 | Temp 98.2°F | Ht 60.0 in | Wt 210.8 lb

## 2014-06-01 DIAGNOSIS — I80239 Phlebitis and thrombophlebitis of unspecified tibial vein: Secondary | ICD-10-CM

## 2014-06-01 DIAGNOSIS — N289 Disorder of kidney and ureter, unspecified: Secondary | ICD-10-CM

## 2014-06-01 DIAGNOSIS — I10 Essential (primary) hypertension: Secondary | ICD-10-CM

## 2014-06-01 DIAGNOSIS — M5440 Lumbago with sciatica, unspecified side: Secondary | ICD-10-CM

## 2014-06-01 DIAGNOSIS — E7800 Pure hypercholesterolemia, unspecified: Secondary | ICD-10-CM

## 2014-06-01 DIAGNOSIS — I743 Embolism and thrombosis of arteries of the lower extremities: Secondary | ICD-10-CM

## 2014-06-01 DIAGNOSIS — N183 Chronic kidney disease, stage 3 unspecified: Secondary | ICD-10-CM | POA: Insufficient documentation

## 2014-06-01 HISTORY — DX: Phlebitis and thrombophlebitis of unspecified tibial vein: I80.239

## 2014-06-01 LAB — BASIC METABOLIC PANEL
BUN: 29 mg/dL — AB (ref 6–23)
CALCIUM: 9.3 mg/dL (ref 8.4–10.5)
CO2: 22 mEq/L (ref 19–32)
Chloride: 105 mEq/L (ref 96–112)
Creatinine, Ser: 1.3 mg/dL — ABNORMAL HIGH (ref 0.4–1.2)
GFR: 43.42 mL/min — ABNORMAL LOW (ref >60.00)
GLUCOSE: 92 mg/dL (ref 70–99)
Potassium: 3.8 mEq/L (ref 3.5–5.1)
SODIUM: 139 meq/L (ref 135–145)

## 2014-06-01 NOTE — Progress Notes (Signed)
Subjective:    Patient ID: Kristin James, female    DOB: 01-18-1956, 58 y.o.   MRN: [id number]  HPI 58 year old female with past history of hypertension, hyperglycemia and hypercholesterolemia who comes in today for a hospital follow up.  Was admitted 05/21/14 with acute foot pain and a cold foot.  She was also found to have acute renal insufficiency.  Had been sick on her stomach.  Felt to be dehydrated.  Was given fluids.  Dr Lucky Cowboy evaluated.  S/p angioplasty.  Foot better.  No pain.  Leg doing well.  No stomach issues now.  On Eloquis.  Due to f/u with Dr Lucky Cowboy on 06/16/14. He is also scheduled to see Dr Rockey Situ.  States Dr Lucky Cowboy wanted f/u with cardiology.  She is now off her estrogen. We discussed trying tylenol instead of meloxicam, especially given on eloquis and previous stomach issues. She feels better.  Seeing Dr Carloyn Manner for her back.  Had MRI that revealed lumbar spondylosis and degenerative disc disease, causing prominent impingement at L5-S1, moderate impingement at L4-5 and mild impingement at L3-4.   Planning for surgery down the road.  We discussed the need to postpone the surgery for now, given this clot, etc.  Due to have a bone density 06/15/14.     Past Medical History  Diagnosis Date  . Hypertension   . Hypercholesteremia   . Hyperglycemia   . Osteoarthritis   . Proteinuria     Current Outpatient Prescriptions on File Prior to Visit  Medication Sig Dispense Refill  . atenolol (TENORMIN) 100 MG tablet Take 1 tablet (100 mg total) by mouth daily.  90 tablet  1  . atorvastatin (LIPITOR) 40 MG tablet Take 1 tablet (40 mg total) by mouth daily.  90 tablet  1  . felodipine (PLENDIL) 10 MG 24 hr tablet Take 1 tablet (10 mg total) by mouth daily.  30 tablet  5  . hyoscyamine (LEVSIN/SL) 0.125 MG SL tablet Place 1 tablet (0.125 mg total) under the tongue every 4 (four) hours as needed for cramping.  20 tablet  3  . losartan-hydrochlorothiazide (HYZAAR) 100-25 MG per tablet Take 1 tablet by  mouth daily.  90 tablet  1  . meloxicam (MOBIC) 15 MG tablet Take 1 tablet (15 mg total) by mouth daily.  30 tablet  1  . nitroGLYCERIN (NITROSTAT) 0.4 MG SL tablet Place 1 tablet (0.4 mg total) under the tongue every 5 (five) minutes as needed for chest pain.  25 tablet  3  . pantoprazole (PROTONIX) 40 MG tablet Take 1 tablet (40 mg total) by mouth daily.  30 tablet  5  . clotrimazole-betamethasone (LOTRISONE) cream Apply 1 application topically 2 (two) times daily.  30 g  0   No current facility-administered medications on file prior to visit.    Review of Systems Patient denies any headache, lightheadedness or dizziness.  No sinus or allergy symptoms.   No chest pain, tightness or palpitations.  No increased shortness of breath, cough or congestion.  No nausea or vomiting.  No acid reflux.  No abdominal pain or cramping.  No bowel change, such as diarrhea, constipation, BRBPR or melana.  No urine change.  Back and leg pain has been an issue for a while now.  She is seeing Dr Carloyn Manner.  Planning for surgery at some point in the future.  Will need to postpone.  Recent clot as outlined.  S/p angioplasty.  Doing well.  On eloquis.  Seeing Dr  Dew.  Feels better.  Walking.  Working - no problems or pain.          Objective:   Physical Exam  Filed Vitals:   06/01/14 1004  BP: 130/70  Pulse: 66  Temp: 98.2 F (34.85 C)   58 year old female in no acute distress.   HEENT:  Nares- clear.  Oropharynx - without lesions. NECK:  Supple.  Nontender.  No audible bruit.  HEART:  Appears to be regular. LUNGS:  No crackles or wheezing audible.  Respirations even and unlabored.  RADIAL PULSE:  Equal bilaterally. ABDOMEN:  Soft, nontender.  Bowel sounds present and normal.  No audible abdominal bruit.  EXTREMITIES:  No increased edema present.  DP pulses palpable and equal bilaterally.  No calf tenderness.  No increased erythema or warmth.     FEET:  No lesions.       Assessment & Plan:  HEMATOLOGY.  Last  H/H stable.  Follow.    CARDIOVASCULAR.  Asymptomatic.    WEIGHT LOSS.  Have discussed the importance of diet and exercise.  She has adjusted her diet.    HEALTH MAINTENANCE.  Physical 02/13/14.  Pap at last physical negative with negative HPV.  Mammogram 05/15/14 - Birads I.  Colonoscopy 10/10 revealed diverticulosis.  Recommended repeat colonoscopy five years.

## 2014-06-01 NOTE — Assessment & Plan Note (Signed)
On lipitor.  Low cholesterol diet and exercise.   Follow lipid panel and liver function.

## 2014-06-01 NOTE — Progress Notes (Signed)
Pre visit review using our clinic review tool, if applicable. No additional management support is needed unless otherwise documented below in the visit note. 

## 2014-06-01 NOTE — Assessment & Plan Note (Signed)
Cr improved with hydration.  Recheck metabolic panel today.  Stay off meloxicam.  On eloquis now.

## 2014-06-01 NOTE — Assessment & Plan Note (Signed)
Recently admitted with thrombus of anterior and posterior tibial artery.  S/p thrombolysis and thrombectomy.  Seeing Dr Lucky Cowboy.  On eloquis.  Due to f/u mid October.  Leg is doing well.  No pain.

## 2014-06-01 NOTE — Assessment & Plan Note (Signed)
Blood pressure under good control.  Same medication regimen.  Follow metabolic panel.   

## 2014-06-01 NOTE — Assessment & Plan Note (Signed)
Back, hip and leg pain as outlined.  MRI as outlined.  Saw Dr Carloyn Manner.  Planning for surgery.  Need to postpone for now given recent clot.

## 2014-06-02 ENCOUNTER — Other Ambulatory Visit: Payer: Self-pay | Admitting: Internal Medicine

## 2014-06-02 ENCOUNTER — Encounter: Payer: Self-pay | Admitting: Internal Medicine

## 2014-06-02 DIAGNOSIS — N289 Disorder of kidney and ureter, unspecified: Secondary | ICD-10-CM

## 2014-06-02 NOTE — Progress Notes (Signed)
Orders placed for f/u labs.  

## 2014-06-08 ENCOUNTER — Other Ambulatory Visit: Payer: BC Managed Care – PPO

## 2014-06-13 ENCOUNTER — Other Ambulatory Visit (INDEPENDENT_AMBULATORY_CARE_PROVIDER_SITE_OTHER): Payer: BC Managed Care – PPO

## 2014-06-13 DIAGNOSIS — N289 Disorder of kidney and ureter, unspecified: Secondary | ICD-10-CM

## 2014-06-13 DIAGNOSIS — I10 Essential (primary) hypertension: Secondary | ICD-10-CM

## 2014-06-13 DIAGNOSIS — E7800 Pure hypercholesterolemia, unspecified: Secondary | ICD-10-CM

## 2014-06-13 DIAGNOSIS — D582 Other hemoglobinopathies: Secondary | ICD-10-CM

## 2014-06-13 DIAGNOSIS — R739 Hyperglycemia, unspecified: Secondary | ICD-10-CM

## 2014-06-13 LAB — LIPID PANEL
CHOLESTEROL: 169 mg/dL (ref 0–200)
HDL: 28.1 mg/dL — ABNORMAL LOW (ref >39.00)
LDL Cholesterol: 102 mg/dL — ABNORMAL HIGH (ref 0–99)
NonHDL: 140.9
TRIGLYCERIDES: 193 mg/dL — AB (ref 0.0–149.0)
Total CHOL/HDL Ratio: 6
VLDL: 38.6 mg/dL (ref 0.0–40.0)

## 2014-06-13 LAB — CBC WITH DIFFERENTIAL/PLATELET
BASOS ABS: 0.1 10*3/uL (ref 0.0–0.1)
Basophils Relative: 0.6 % (ref 0.0–3.0)
Eosinophils Absolute: 0.1 10*3/uL (ref 0.0–0.7)
Eosinophils Relative: 0.6 % (ref 0.0–5.0)
HCT: 46 % (ref 36.0–46.0)
Hemoglobin: 15.3 g/dL — ABNORMAL HIGH (ref 12.0–15.0)
LYMPHS PCT: 31.9 % (ref 12.0–46.0)
Lymphs Abs: 2.8 10*3/uL (ref 0.7–4.0)
MCHC: 33.4 g/dL (ref 30.0–36.0)
MCV: 89.7 fl (ref 78.0–100.0)
Monocytes Absolute: 0.4 10*3/uL (ref 0.1–1.0)
Monocytes Relative: 5 % (ref 3.0–12.0)
Neutro Abs: 5.5 10*3/uL (ref 1.4–7.7)
Neutrophils Relative %: 61.9 % (ref 43.0–77.0)
Platelets: 293 10*3/uL (ref 150.0–400.0)
RBC: 5.12 Mil/uL — ABNORMAL HIGH (ref 3.87–5.11)
RDW: 13.4 % (ref 11.5–15.5)
WBC: 8.9 10*3/uL (ref 4.0–10.5)

## 2014-06-13 LAB — URINALYSIS, ROUTINE W REFLEX MICROSCOPIC
BILIRUBIN URINE: NEGATIVE
Ketones, ur: NEGATIVE
NITRITE: NEGATIVE
Specific Gravity, Urine: 1.015 (ref 1.000–1.030)
Total Protein, Urine: 30 — AB
Urine Glucose: NEGATIVE
Urobilinogen, UA: 0.2 (ref 0.0–1.0)
pH: 6 (ref 5.0–8.0)

## 2014-06-13 LAB — HEPATIC FUNCTION PANEL
ALBUMIN: 3.4 g/dL — AB (ref 3.5–5.2)
ALK PHOS: 81 U/L (ref 39–117)
ALT: 27 U/L (ref 0–35)
AST: 27 U/L (ref 0–37)
Bilirubin, Direct: 0.1 mg/dL (ref 0.0–0.3)
Total Bilirubin: 0.9 mg/dL (ref 0.2–1.2)
Total Protein: 7.4 g/dL (ref 6.0–8.3)

## 2014-06-13 LAB — BASIC METABOLIC PANEL
BUN: 40 mg/dL — ABNORMAL HIGH (ref 6–23)
CALCIUM: 9.3 mg/dL (ref 8.4–10.5)
CO2: 21 meq/L (ref 19–32)
CREATININE: 1.4 mg/dL — AB (ref 0.4–1.2)
Chloride: 104 mEq/L (ref 96–112)
GFR: 41.26 mL/min — ABNORMAL LOW (ref >60.00)
Glucose, Bld: 197 mg/dL — ABNORMAL HIGH (ref 70–99)
Potassium: 3.7 mEq/L (ref 3.5–5.1)
Sodium: 137 mEq/L (ref 135–145)

## 2014-06-13 LAB — TSH: TSH: 0.75 u[IU]/mL (ref 0.35–4.50)

## 2014-06-13 LAB — HEMOGLOBIN A1C: Hgb A1c MFr Bld: 6.6 % — ABNORMAL HIGH (ref 4.6–6.5)

## 2014-06-14 ENCOUNTER — Encounter: Payer: Self-pay | Admitting: Cardiovascular Disease

## 2014-06-14 ENCOUNTER — Ambulatory Visit (INDEPENDENT_AMBULATORY_CARE_PROVIDER_SITE_OTHER): Payer: BC Managed Care – PPO | Admitting: Cardiovascular Disease

## 2014-06-14 VITALS — BP 110/82 | HR 140 | Ht 60.0 in | Wt 214.5 lb

## 2014-06-14 DIAGNOSIS — I480 Paroxysmal atrial fibrillation: Secondary | ICD-10-CM | POA: Insufficient documentation

## 2014-06-14 DIAGNOSIS — I998 Other disorder of circulatory system: Secondary | ICD-10-CM

## 2014-06-14 DIAGNOSIS — I4891 Unspecified atrial fibrillation: Secondary | ICD-10-CM

## 2014-06-14 DIAGNOSIS — Z6841 Body Mass Index (BMI) 40.0 and over, adult: Secondary | ICD-10-CM | POA: Insufficient documentation

## 2014-06-14 DIAGNOSIS — M4800 Spinal stenosis, site unspecified: Secondary | ICD-10-CM

## 2014-06-14 DIAGNOSIS — I158 Other secondary hypertension: Secondary | ICD-10-CM

## 2014-06-14 MED ORDER — DILTIAZEM HCL ER COATED BEADS 120 MG PO CP24: 120.0000 mg | ORAL_CAPSULE | Freq: Every day | ORAL | Status: DC

## 2014-06-14 MED ORDER — AMIODARONE HCL 200 MG PO TABS: 200.0000 mg | ORAL_TABLET | Freq: Two times a day (BID) | ORAL | Status: DC

## 2014-06-14 NOTE — Assessment & Plan Note (Signed)
Recent clot to the left lower extremity with thrombectomy by Dr. Lucky Cowboy. Likely secondary to atrial fibrillation, paroxysmal. Recommended that she stay on her Eliquis indefinitely at this time

## 2014-06-14 NOTE — Assessment & Plan Note (Signed)
Medication changes as above. Also we'll cut her losartan HCTZ in half as blood pressures running low

## 2014-06-14 NOTE — Patient Instructions (Addendum)
Your next appointment will be scheduled in our new office located at :  [place]  [address] [place], [place] [address]   You are in atrial fibrillation  Please hold the felodipine Start diltiazem one a day Start amiodarone 2 pills in the AM and 2 pills in the Pm for 5 days,  Then down to 1 pill twice a day  Cut the losartan/HCTZ in 1/2 daily  Please call us if you have new issues that need to be addressed before your next appt.  Your physician wants you to follow-up in: 2 weeks

## 2014-06-14 NOTE — Assessment & Plan Note (Signed)
She reports a history of spinal stenosis, severe back pain. We have requested that she delay her surgery until we are able to restore normal sinus rhythm. Following conversion to normal sinus rhythm, with stay on anticoagulation at least 4 weeks or more.

## 2014-06-14 NOTE — Assessment & Plan Note (Addendum)
EKG showing atrial fibrillation with rate 140 beats per minute today in the office. She is relatively asymptomatic. Long discussion with her today about her atrial fibrillation and management.  This is likely the source of her embolism to the left lower extremity. Recommended that she stay on her eliquis 5 mg twice a day, Would stop the felodipine, start diltiazem 120 mg daily for better heart rate control, Would start amiodarone 400 mg twice a day for the first 5 days then down to 200 mg twice a day in an attempt to restore normal sinus rhythm.  Recheck EKG in 2 weeks

## 2014-06-14 NOTE — Assessment & Plan Note (Signed)
We have encouraged continued careful diet management in an effort to lose weight.  

## 2014-06-14 NOTE — Progress Notes (Signed)
Patient ID: Kristin James, female    DOB: February 24, 1956, 58 y.o.   MRN: [id number]  HPI Comments: Kristin James is a pleasant 58 year old woman with history of chronic back pain, spinal stenosis, obesity, recent admission to the hospital in September 2015 for acute left lower extremity ischemia secondary to thrombus. She presents to establish care in the [place] office  Notes indicate that she was admitted on September 20 with acute pain in her left leg. She had no distal pulses. Her creatinine was 1.99 which improved to 1.0 with fluids. She underwent angiogram of the left lower extremity with thrombolysis with TPA of the left pill, tibial peroneal trunk, peroneal arteries and distally, thrombectomy of the vessel with restoration of flow.  She was started on anticoagulation with eliquis. She presents today and is on 5 mg twice a day Birth control medication was held. There was a concern that the birth control medication may have caused a thrombus. She denies any prior arrhythmia  Stress test was done in the hospital for chest pain. This showed no ischemia. Ejection fraction 67% EKG in the hospital showed normal sinus rhythm with rate 74 beats per minute. EKG dated 05/21/2014  In followup today, she does report occasional shortness of breath with exertion. Otherwise feels well Denies having any lower extremity swelling, no PND orthopnea She states that she is to have back surgery at some point  EKG today shows atrial fibrillation with ventricular rate 140 beats per minute, nonspecific ST abnormality likely secondary to underlying tachycardia   Outpatient Encounter Prescriptions as of 06/14/2014  Medication Sig  . acetaminophen (TYLENOL) 650 MG CR tablet Take 650 mg by mouth every 8 (eight) hours as needed for pain.  Marland Kitchen apixaban (ELIQUIS) 5 MG TABS tablet Take 5 mg by mouth 2 (two) times daily.  Marland Kitchen atenolol (TENORMIN) 100 MG tablet Take 1 tablet (100 mg total) by mouth daily.  Marland Kitchen atorvastatin  (LIPITOR) 40 MG tablet Take 1 tablet (40 mg total) by mouth daily.  . Cyanocobalamin (B-12 PO) Take by mouth.  . nitroGLYCERIN (NITROSTAT) 0.4 MG SL tablet Place 1 tablet (0.4 mg total) under the tongue every 5 (five) minutes as needed for chest pain.  . pantoprazole (PROTONIX) 40 MG tablet Take 1 tablet (40 mg total) by mouth daily.  .  felodipine (PLENDIL) 10 MG 24 hr tablet Take 1 tablet (10 mg total) by mouth daily.  Marland Kitchen  losartan-hydrochlorothiazide (HYZAAR) 100-25 MG per tablet Take 1 tablet by mouth daily.    Review of Systems  Constitutional: Negative.   HENT: Negative.   Eyes: Negative.   Respiratory: Positive for shortness of breath.   Cardiovascular: Negative.   Gastrointestinal: Negative.   Endocrine: Negative.   Musculoskeletal: Negative.   Skin: Negative.   Allergic/Immunologic: Negative.   Neurological: Negative.   Hematological: Negative.   Psychiatric/Behavioral: Negative.   All other systems reviewed and are negative.   BP 110/82  Pulse 140  Ht 5' (1.524 m)  Wt 214 lb 8 oz (97.297 kg)  BMI 41.89 kg/m2  Physical Exam  Nursing note and vitals reviewed. Constitutional: She is oriented to person, place, and time. She appears well-developed and well-nourished.  Obese  HENT:  Head: Normocephalic.  Nose: Nose normal.  Mouth/Throat: Oropharynx is clear and moist.  Eyes: Conjunctivae are normal. Pupils are equal, round, and reactive to light.  Neck: Normal range of motion. Neck supple. No JVD present.  Cardiovascular: S1 normal, S2 normal, normal heart sounds and intact distal pulses.  An irregularly irregular rhythm present. Tachycardia present.  Exam reveals no gallop and no friction rub.   No murmur heard. Pulmonary/Chest: Effort normal and breath sounds normal. No respiratory distress. She has no wheezes. She has no rales. She exhibits no tenderness.  Abdominal: Soft. Bowel sounds are normal. She exhibits no distension. There is no tenderness.  Musculoskeletal:  Normal range of motion. She exhibits no edema and no tenderness.  Lymphadenopathy:    She has no cervical adenopathy.  Neurological: She is alert and oriented to person, place, and time. Coordination normal.  Skin: Skin is warm and dry. No rash noted. No erythema.  Psychiatric: She has a normal mood and affect. Her behavior is normal. Judgment and thought content normal.    Assessment and Plan

## 2014-06-15 ENCOUNTER — Encounter: Payer: Self-pay | Admitting: *Deleted

## 2014-06-15 ENCOUNTER — Other Ambulatory Visit: Payer: Self-pay | Admitting: Internal Medicine

## 2014-06-15 DIAGNOSIS — N289 Disorder of kidney and ureter, unspecified: Secondary | ICD-10-CM

## 2014-06-15 NOTE — Progress Notes (Signed)
Order placed for f/u met b.  

## 2014-06-22 ENCOUNTER — Other Ambulatory Visit (INDEPENDENT_AMBULATORY_CARE_PROVIDER_SITE_OTHER): Payer: BC Managed Care – PPO

## 2014-06-22 DIAGNOSIS — N289 Disorder of kidney and ureter, unspecified: Secondary | ICD-10-CM

## 2014-06-23 ENCOUNTER — Encounter: Payer: Self-pay | Admitting: Internal Medicine

## 2014-06-23 ENCOUNTER — Telehealth: Payer: Self-pay | Admitting: Internal Medicine

## 2014-06-23 ENCOUNTER — Other Ambulatory Visit: Payer: Self-pay | Admitting: *Deleted

## 2014-06-23 DIAGNOSIS — N289 Disorder of kidney and ureter, unspecified: Secondary | ICD-10-CM

## 2014-06-23 LAB — BASIC METABOLIC PANEL
BUN: 20 mg/dL (ref 6–23)
CALCIUM: 9.1 mg/dL (ref 8.4–10.5)
CO2: 28 mEq/L (ref 19–32)
CREATININE: 1.2 mg/dL (ref 0.4–1.2)
Chloride: 103 mEq/L (ref 96–112)
GFR: 48.41 mL/min — ABNORMAL LOW (ref >60.00)
Glucose, Bld: 118 mg/dL — ABNORMAL HIGH (ref 70–99)
Potassium: 4 mEq/L (ref 3.5–5.1)
SODIUM: 137 meq/L (ref 135–145)

## 2014-06-23 MED ORDER — ATORVASTATIN CALCIUM 40 MG PO TABS: 40.0000 mg | ORAL_TABLET | Freq: Every day | ORAL | Status: DC

## 2014-06-23 NOTE — Telephone Encounter (Signed)
Pr notified of lab results via my chart.  Needs non fasting lab appt in 3-4 weeks.  Please schedule and contact her with a lab appt date and time.   Thanks.  Dr Nicki Reaper

## 2014-06-26 ENCOUNTER — Encounter: Payer: Self-pay | Admitting: Internal Medicine

## 2014-06-28 ENCOUNTER — Encounter: Payer: Self-pay | Admitting: Cardiovascular Disease

## 2014-06-28 ENCOUNTER — Encounter: Payer: Self-pay | Admitting: Internal Medicine

## 2014-06-28 ENCOUNTER — Ambulatory Visit (INDEPENDENT_AMBULATORY_CARE_PROVIDER_SITE_OTHER): Payer: BC Managed Care – PPO | Admitting: Internal Medicine

## 2014-06-28 ENCOUNTER — Ambulatory Visit (INDEPENDENT_AMBULATORY_CARE_PROVIDER_SITE_OTHER): Payer: BC Managed Care – PPO | Admitting: Cardiovascular Disease

## 2014-06-28 VITALS — BP 150/86 | HR 47 | Ht 60.0 in | Wt 213.5 lb

## 2014-06-28 VITALS — BP 122/80 | HR 51 | Temp 98.1°F | Ht 60.0 in | Wt 213.8 lb

## 2014-06-28 DIAGNOSIS — I10 Essential (primary) hypertension: Secondary | ICD-10-CM

## 2014-06-28 DIAGNOSIS — M4800 Spinal stenosis, site unspecified: Secondary | ICD-10-CM

## 2014-06-28 DIAGNOSIS — I743 Embolism and thrombosis of arteries of the lower extremities: Secondary | ICD-10-CM

## 2014-06-28 DIAGNOSIS — I4891 Unspecified atrial fibrillation: Secondary | ICD-10-CM

## 2014-06-28 DIAGNOSIS — N289 Disorder of kidney and ureter, unspecified: Secondary | ICD-10-CM

## 2014-06-28 DIAGNOSIS — M5440 Lumbago with sciatica, unspecified side: Secondary | ICD-10-CM

## 2014-06-28 MED ORDER — TRAMADOL HCL 50 MG PO TABS
50.0000 mg | ORAL_TABLET | Freq: Four times a day (QID) | ORAL | Status: DC | PRN
Start: 2014-06-28 — End: 2014-07-14

## 2014-06-28 NOTE — Assessment & Plan Note (Signed)
We have encouraged continued exercise, careful diet management in an effort to lose weight. 

## 2014-06-28 NOTE — Assessment & Plan Note (Signed)
Normal sinus rhythm restored on amiodarone, beta blocker, calcium channel blocker. She has sinus bradycardia today and we will decrease the amiodarone down to 200 mg daily, decrease the atenolol down to 50 mg daily, continue diltiazem 120 mg daily. Suggested she stay on eliquis for at least 3 more weeks. After that she can hold the anticoagulation and have back surgery. She has good extremity perfusion and no further arterial studies need to be done per the patient after she met with Dr. Ronalee Belts. She will likely need 5-7 days off eliquis prior to back surgery and would restart the anticoagulation when appropriate. Recommended she stay on eliquis indefinitely following the surgery  as she has asymptomatic atrial fibrillation.

## 2014-06-28 NOTE — Assessment & Plan Note (Signed)
Blood pressure appears stable. Encouraged her to cut her atenolol down to 50 mg daily given her bradycardia

## 2014-06-28 NOTE — Assessment & Plan Note (Addendum)
Renal function last week returned back to normal. Previously elevated BUN and creatinine possibly from higher dose HCTZ. She is now taking one half losartan HCTZ

## 2014-06-28 NOTE — Progress Notes (Signed)
Patient ID: BLENDA WISECUP, female    DOB: 02-12-1956, 58 y.o.   MRN: [id number]  HPI Comments: Ms. Santarelli is a pleasant 58 year old woman with history of chronic back pain, spinal stenosis, obesity, recent admission to the hospital in September 2015 for acute left lower extremity arterial ischemia secondary to thrombus. She presents for routine followup  admitted to the hospital on May 21 2014 with acute pain in her left leg. She had no distal pulses. Her creatinine was 1.99 which improved to 1.0 with fluids. She underwent angiogram of the left lower extremity with thrombolysis with TPA of the left pill, tibial peroneal trunk, peroneal arteries and distally, thrombectomy of the vessel with restoration of flow. She was started on anticoagulation with eliquis 5 mg twice a day Birth control medication was held.   In followup in the office, she was noted to be in atrial fibrillation. Placed on amiodarone, diltiazem, beta blocker, she presents for followup today EKG shows normal sinus rhythm. She reports feeling better more than one week ago.  She reports that her back discomfort is getting worse and she would like to have surgery for stenosis. Also has bulging disc. She reports that she was seen by Dr. Ronalee Belts and was told that she had good perfusion to her lower extremities and that no further intervention was needed. Blood pressure at home running typically 956-213 systolic Husband presents with her today. He reports that she does not have any sleep apnea, does have occasional snoring  Stress test was done in the hospital for chest pain. This showed no ischemia. Ejection fraction 67% EKG in the hospital showed normal sinus rhythm with rate 74 beats per minute. EKG dated 05/21/2014  EKG today shows normal sinus rhythm with rate 47 beats per minute, no significant ST or T wave changes   Outpatient Encounter Prescriptions as of 06/28/2014  Medication Sig  . acetaminophen (TYLENOL) 650 MG CR  tablet Take 650 mg by mouth every 8 (eight) hours as needed for pain.  Marland Kitchen amiodarone (PACERONE) 200 MG tablet Take 1 tablet (200 mg total) by mouth 2 (two) times daily.  Marland Kitchen apixaban (ELIQUIS) 5 MG TABS tablet Take 5 mg by mouth 2 (two) times daily.  Marland Kitchen atenolol (TENORMIN) 100 MG tablet Take 1 tablet (100 mg total) by mouth daily.  Marland Kitchen atorvastatin (LIPITOR) 40 MG tablet Take 1 tablet (40 mg total) by mouth daily.  . Cyanocobalamin (B-12 PO) Take by mouth.  . diltiazem (CARDIZEM CD) 120 MG 24 hr capsule Take 1 capsule (120 mg total) by mouth daily.  Marland Kitchen losartan-hydrochlorothiazide (HYZAAR) 100-25 MG per tablet Take 0.5 tablets by mouth daily.  . nitroGLYCERIN (NITROSTAT) 0.4 MG SL tablet Place 1 tablet (0.4 mg total) under the tongue every 5 (five) minutes as needed for chest pain.  . pantoprazole (PROTONIX) 40 MG tablet Take 1 tablet (40 mg total) by mouth daily.  . traMADol (ULTRAM) 50 MG tablet Take 1 tablet (50 mg total) by mouth every 6 (six) hours as needed.    Review of Systems  Constitutional: Negative.   HENT: Negative.   Eyes: Negative.   Respiratory: Negative.   Cardiovascular: Negative.   Gastrointestinal: Negative.   Endocrine: Negative.   Musculoskeletal: Negative.   Skin: Negative.   Allergic/Immunologic: Negative.   Neurological: Negative.   Hematological: Negative.   Psychiatric/Behavioral: Negative.   All other systems reviewed and are negative.   BP 150/86  Pulse 47  Ht 5' (1.524 m)  Wt 213 lb 8  oz (96.843 kg)  BMI 41.70 kg/m2  Physical Exam  Nursing note and vitals reviewed. Constitutional: She is oriented to person, place, and time. She appears well-developed and well-nourished.  Obese  HENT:  Head: Normocephalic.  Nose: Nose normal.  Mouth/Throat: Oropharynx is clear and moist.  Eyes: Conjunctivae are normal. Pupils are equal, round, and reactive to light.  Neck: Normal range of motion. Neck supple. No JVD present.  Cardiovascular: S1 normal, S2 normal,  normal heart sounds and intact distal pulses.  An irregularly irregular rhythm present. Tachycardia present.  Exam reveals no gallop and no friction rub.   No murmur heard. Pulmonary/Chest: Effort normal and breath sounds normal. No respiratory distress. She has no wheezes. She has no rales. She exhibits no tenderness.  Abdominal: Soft. Bowel sounds are normal. She exhibits no distension. There is no tenderness.  Musculoskeletal: Normal range of motion. She exhibits no edema and no tenderness.  Lymphadenopathy:    She has no cervical adenopathy.  Neurological: She is alert and oriented to person, place, and time. Coordination normal.  Skin: Skin is warm and dry. No rash noted. No erythema.  Psychiatric: She has a normal mood and affect. Her behavior is normal. Judgment and thought content normal.    Assessment and Plan

## 2014-06-28 NOTE — Assessment & Plan Note (Addendum)
Arterial thrombus likely from atrial fibrillation. Less likely from estrogen. Now maintaining normal sinus rhythm. Would continue  Eliquis  as above

## 2014-06-28 NOTE — Assessment & Plan Note (Signed)
She would be acceptable risk for back surgery in 1 months time. We need 3 weeks of additional anticoagulation before holding the eliquis.

## 2014-06-28 NOTE — Progress Notes (Signed)
Pre visit review using our clinic review tool, if applicable. No additional management support is needed unless otherwise documented below in the visit note. 

## 2014-06-28 NOTE — Patient Instructions (Addendum)
Your next appointment will be scheduled in our new office located at :  [place]  [address] [place], [place] [address]   You are doing well.  Please decrease the amiodarone down to one a day Please decrease the atenolol down to 1/2 pill once a day  On the iphone Go to the app store Search for pulse meter Download free apps : instant heart rate, cardiograph  Please call us if you have new issues that need to be addressed before your next appt.  Your physician wants you to follow-up in: 1 month.  You will receive a reminder letter in the mail two months in advance. If you don't receive a letter, please call our office to schedule the follow-up appointment.

## 2014-07-03 ENCOUNTER — Encounter: Payer: Self-pay | Admitting: Internal Medicine

## 2014-07-03 NOTE — Progress Notes (Signed)
Subjective:    Patient ID: Kristin James, female    DOB: 1955/12/03, 58 y.o.   MRN: [id number]  HPI 58 year old female with past history of hypertension, hyperglycemia and hypercholesterolemia who comes in today as a work  In with concerns regarding increased back pain.  Was admitted 05/21/14 with acute foot pain and a cold foot.  She was also found to have acute renal insufficiency.  Had been sick on her stomach.  Felt to be dehydrated.  Was given fluids.  Dr Lucky Cowboy evaluated.  S/p angioplasty.  Foot better.  No pain.  Leg doing well.  No stomach issues now.  On Eloquis.  Just saw vascular surgery.  Felt stable.  Recommended f/u in one year.  Seeing Dr Carloyn Manner for her back.  Had MRI that revealed lumbar spondylosis and degenerative disc disease, causing prominent impingement at L5-S1, moderate impingement at L4-5 and mild impingement at L3-4.   Planning for surgery down the road.  Just saw Dr Carloyn Manner.  She comes in today with increased back and leg pain.  Right leg greater than left.  Stiffness in her low back.  Is off meloxicam now and is wanting something else for her back.  States Dr Carloyn Manner told her to take tylenol.  She has been taking.     Past Medical History  Diagnosis Date  . Hypertension   . Hypercholesteremia   . Hyperglycemia   . Osteoarthritis   . Proteinuria   . Hiatal hernia   . Spinal stenosis at L4-L5 level   . Peripheral arterial disease     embolic disease   . Embolus to lower extremity     Current Outpatient Prescriptions on File Prior to Visit  Medication Sig Dispense Refill  . acetaminophen (TYLENOL) 650 MG CR tablet Take 650 mg by mouth every 8 (eight) hours as needed for pain.    Marland Kitchen amiodarone (PACERONE) 200 MG tablet Take 1 tablet (200 mg total) by mouth 2 (two) times daily. 70 tablet 3  . apixaban (ELIQUIS) 5 MG TABS tablet Take 5 mg by mouth 2 (two) times daily.    Marland Kitchen atenolol (TENORMIN) 100 MG tablet Take 1 tablet (100 mg total) by mouth daily. 90 tablet 1  . atorvastatin  (LIPITOR) 40 MG tablet Take 1 tablet (40 mg total) by mouth daily. 90 tablet 1  . Cyanocobalamin (B-12 PO) Take by mouth.    . diltiazem (CARDIZEM CD) 120 MG 24 hr capsule Take 1 capsule (120 mg total) by mouth daily. 30 capsule 6  . losartan-hydrochlorothiazide (HYZAAR) 100-25 MG per tablet Take 0.5 tablets by mouth daily. 90 tablet 1  . nitroGLYCERIN (NITROSTAT) 0.4 MG SL tablet Place 1 tablet (0.4 mg total) under the tongue every 5 (five) minutes as needed for chest pain. 25 tablet 3  . pantoprazole (PROTONIX) 40 MG tablet Take 1 tablet (40 mg total) by mouth daily. 30 tablet 5   No current facility-administered medications on file prior to visit.    Review of Systems Patient denies any headache, lightheadedness or dizziness.  No sinus or allergy symptoms.   No chest pain, tightness or palpitations.  No increased shortness of breath, cough or congestion.  No nausea or vomiting.  No acid reflux.  No abdominal pain or cramping.  No bowel change, such as diarrhea, constipation, BRBPR or melana.  No urine change.  Back and leg pain has been an issue for a while now.  She is seeing Dr Carloyn Manner.  Planning for surgery  at some point in the future. Had to be postponed secondary to her recent hospitalization.  Recent clot as outlined.  S/p angioplasty.  Doing well.  On eloquis.  Seeing Dr Dew/Schnier.  Increased back stiffness as outlined.  Leg pain as outlined.           Objective:   Physical Exam  Filed Vitals:   06/28/14 1103  BP: 122/80  Pulse: 51  Temp: 98.1 F (36.7 C)   Blood pressure recheck:  75/9  58 year old female in no acute distress.   HEENT:  Nares- clear.  Oropharynx - without lesions. NECK:  Supple.  Nontender.  No audible bruit.  HEART:  Appears to be regular. LUNGS:  No crackles or wheezing audible.  Respirations even and unlabored.  RADIAL PULSE:  Equal bilaterally. ABDOMEN:  Soft, nontender.  Bowel sounds present and normal.  No audible abdominal bruit.  EXTREMITIES:  No  increased edema present.  DP pulses palpable and equal bilaterally.  BACK:  Non tender to palpation.   MSK:  Some increased pain with straight leg raise.      Assessment & Plan:  Essential hypertension Blood pressure slightly increased on my check today.  She is in increased pain.  BP meds just adjusted.  Follow pressures.  Have her spot check her pressure.  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  Felt stable.  Recommended f/u in one year.    Atrial fibrillation with RVR On Eloquis.  Has f/u planned with Dr Rockey Situ this pm.  Doing well.   Renal insufficiency Off meloxicam now.  Stay hydrated.  BP medication adjusted.  Cr just checked - 1.2.  Follow.   5. Low back pain with sciatica, sciatica laterality unspecified, unspecified back pain laterality Back and leg pain as outlined.  MRI as outlined.  Surgery had to be postponed.  Unable to take meloxicam now.  Start ultram as directed.  Follow.   HEALTH MAINTENANCE.  Physical 02/13/14.  Pap at last physical negative with negative HPV.  Mammogram 05/15/14 - Birads I.  Colonoscopy 10/10 revealed diverticulosis.  Recommended repeat colonoscopy five years.

## 2014-07-14 ENCOUNTER — Other Ambulatory Visit: Payer: Self-pay | Admitting: *Deleted

## 2014-07-14 MED ORDER — TRAMADOL HCL 50 MG PO TABS: 50.0000 mg | ORAL_TABLET | Freq: Three times a day (TID) | ORAL | Status: DC | PRN

## 2014-07-14 NOTE — Telephone Encounter (Signed)
She is unable to take antiinflammatories.  Her surgery was postponed secondary to some other medical issues.  She is using this to bridge over (control her pain) until can have her surgery.  ok'd refill tramadol #90 with no refills.  rx signed and placed in your basket.

## 2014-07-14 NOTE — Telephone Encounter (Signed)
Okay to refill Tramadol? Last refilled on 06/28/14 & pt states that she is taking it TID every day. Please advise

## 2014-07-14 NOTE — Telephone Encounter (Signed)
Rx faxed

## 2014-08-01 ENCOUNTER — Encounter: Payer: Self-pay | Admitting: Cardiovascular Disease

## 2014-08-01 ENCOUNTER — Ambulatory Visit (INDEPENDENT_AMBULATORY_CARE_PROVIDER_SITE_OTHER): Payer: BC Managed Care – PPO | Admitting: Cardiovascular Disease

## 2014-08-01 VITALS — BP 150/80 | HR 48 | Ht 60.0 in | Wt 211.2 lb

## 2014-08-01 DIAGNOSIS — I10 Essential (primary) hypertension: Secondary | ICD-10-CM

## 2014-08-01 DIAGNOSIS — I4891 Unspecified atrial fibrillation: Secondary | ICD-10-CM

## 2014-08-01 DIAGNOSIS — M4800 Spinal stenosis, site unspecified: Secondary | ICD-10-CM

## 2014-08-01 DIAGNOSIS — I998 Other disorder of circulatory system: Secondary | ICD-10-CM

## 2014-08-01 DIAGNOSIS — N289 Disorder of kidney and ureter, unspecified: Secondary | ICD-10-CM

## 2014-08-01 MED ORDER — LOSARTAN POTASSIUM-HCTZ 50-12.5 MG PO TABS: 1.0000 | ORAL_TABLET | Freq: Every day | ORAL | Status: DC

## 2014-08-01 MED ORDER — ATENOLOL 25 MG PO TABS: 25.0000 mg | ORAL_TABLET | Freq: Every day | ORAL | Status: DC

## 2014-08-01 MED ORDER — AMIODARONE HCL 200 MG PO TABS: 200.0000 mg | ORAL_TABLET | Freq: Every day | ORAL | Status: DC

## 2014-08-01 MED ORDER — DILTIAZEM HCL ER COATED BEADS 120 MG PO CP24: 120.0000 mg | ORAL_CAPSULE | Freq: Every day | ORAL | Status: DC

## 2014-08-01 MED ORDER — ATORVASTATIN CALCIUM 40 MG PO TABS: 40.0000 mg | ORAL_TABLET | Freq: Every day | ORAL | Status: DC

## 2014-08-01 MED ORDER — APIXABAN 5 MG PO TABS: 5.0000 mg | ORAL_TABLET | Freq: Two times a day (BID) | ORAL | Status: DC

## 2014-08-01 NOTE — Patient Instructions (Addendum)
You are doing well. Please decrease the atenolol down to 25 mg daily Stay on the other medications  Call the office when you know your surgery date  Please call us if you have new issues that need to be addressed before your next appt.  Your physician wants you to follow-up in: 6 months.  You will receive a reminder letter in the mail two months in advance. If you don't receive a letter, please call our office to schedule the follow-up appointment.

## 2014-08-01 NOTE — Assessment & Plan Note (Signed)
She reports having continued severe back pain. She'll be acceptable risk for back surgery in early 2016 when she has follow-up with Dr. Carloyn Manner. No further testing needed. Would stop anticoagulation several days prior to the procedure

## 2014-08-01 NOTE — Assessment & Plan Note (Signed)
No further ischemic events. Embolic event secondary to atrial fibrillation. Will likely continue on anticoagulation indefinitely. This was discussed with her today

## 2014-08-01 NOTE — Assessment & Plan Note (Signed)
We have encouraged continued exercise, careful diet management in an effort to lose weight. 

## 2014-08-01 NOTE — Assessment & Plan Note (Signed)
Follow-up basic metabolic panel showed normal renal function

## 2014-08-01 NOTE — Assessment & Plan Note (Signed)
Maintaining normal sinus rhythm. Recommended she stay on anticoagulation. We'll decrease atenolol down to 25 mg daily, continue her other medications

## 2014-08-01 NOTE — Assessment & Plan Note (Signed)
She reports blood pressure has been well-controlled at home on her current regimen. Atenolol dose decreased for bradycardia

## 2014-08-01 NOTE — Progress Notes (Signed)
Patient ID: Kristin James, female    DOB: December 17, 1955, 58 y.o.   MRN: [id number]  HPI Comments: Kristin James is a pleasant 58 year old woman with history of chronic back pain, spinal stenosis, obesity, recent admission to the hospital in September 2015 for acute left lower extremity arterial ischemia secondary to thrombus. She presents for routine followup of her atrial fibrillation  Prior office visit, EKG documented atrial fibrillation. Started on amiodarone, diltiazem, beta blocker and she converted to normal sinus rhythm. Atenolol dose has been slowly decreased secondary to bradycardia  In follow-up today, she reports that she is doing well. Heart rate has been slow at home, typically in the high 40s, low 50s. She continues to have severe back pain. Scheduled to see Dr. Carloyn Manner in January 2016. No cough occasions on her anticoagulation. She is currently taking atenolol 50 mg daily, amiodarone 200 mg daily, diltiazem 120 mg daily No regular exercise secondary to her back pain Weight continues to be a problem  EKG today showing sinus bradycardia with heart rate 48 bpm, no significant ST or T wave change is  Past medical history admitted to the hospital on May 21 2014 with acute pain in her left leg. She had no distal pulses. Her creatinine was 1.99 which improved to 1.0 with fluids. She underwent angiogram of the left lower extremity with thrombolysis with TPA of the left pill, tibial peroneal trunk, peroneal arteries and distally, thrombectomy of the vessel with restoration of flow. she does not have any sleep apnea, does have occasional snoring  Stress test was done in the hospital for chest pain. This showed no ischemia. Ejection fraction 67% EKG in the hospital showed normal sinus rhythm with rate 74 beats per minute. EKG dated 05/21/2014   Outpatient Encounter Prescriptions as of 08/01/2014  Medication Sig  . acetaminophen (TYLENOL) 650 MG CR tablet Take 650 mg by mouth every 8  (eight) hours as needed for pain.  Marland Kitchen amiodarone (PACERONE) 200 MG tablet Take 1 tablet (200 mg total) by mouth 2 (two) times daily.  Marland Kitchen apixaban (ELIQUIS) 5 MG TABS tablet Take 5 mg by mouth 2 (two) times daily.  Marland Kitchen atenolol (TENORMIN) 100 MG tablet Take 1 tablet (100 mg total) by mouth daily.  Marland Kitchen atorvastatin (LIPITOR) 40 MG tablet Take 1 tablet (40 mg total) by mouth daily.  . Cyanocobalamin (B-12 PO) Take by mouth.  . diltiazem (CARDIZEM CD) 120 MG 24 hr capsule Take 1 capsule (120 mg total) by mouth daily.  Marland Kitchen losartan-hydrochlorothiazide (HYZAAR) 100-25 MG per tablet Take 0.5 tablets by mouth daily.  . nitroGLYCERIN (NITROSTAT) 0.4 MG SL tablet Place 1 tablet (0.4 mg total) under the tongue every 5 (five) minutes as needed for chest pain.  . pantoprazole (PROTONIX) 40 MG tablet Take 1 tablet (40 mg total) by mouth daily.  . traMADol (ULTRAM) 50 MG tablet Take 1 tablet (50 mg total) by mouth 3 (three) times daily as needed.   Social history  reports that she has never smoked. She has never used smokeless tobacco. She reports that she drinks alcohol. She reports that she does not use illicit drugs.  Review of Systems  Constitutional: Negative.   Respiratory: Negative.   Cardiovascular: Negative.   Gastrointestinal: Negative.   Musculoskeletal: Positive for back pain.  Skin: Negative.   Neurological: Negative.   Hematological: Negative.   Psychiatric/Behavioral: Negative.   All other systems reviewed and are negative.   BP 150/80 mmHg  Pulse 48  Ht 5' (1.524 m)  Wt 211 lb 4 oz (95.822 kg)  BMI 41.26 kg/m2  Physical Exam  Constitutional: She is oriented to person, place, and time. She appears well-developed and well-nourished.  HENT:  Head: Normocephalic.  Nose: Nose normal.  Mouth/Throat: Oropharynx is clear and moist.  Eyes: Conjunctivae are normal. Pupils are equal, round, and reactive to light.  Neck: Normal range of motion. Neck supple. No JVD present.  Cardiovascular:  Normal rate, regular rhythm, S1 normal, S2 normal, normal heart sounds and intact distal pulses.  Exam reveals no gallop and no friction rub.   No murmur heard. Pulmonary/Chest: Effort normal and breath sounds normal. No respiratory distress. She has no wheezes. She has no rales. She exhibits no tenderness.  Abdominal: Soft. Bowel sounds are normal. She exhibits no distension. There is no tenderness.  Musculoskeletal: Normal range of motion. She exhibits no edema or tenderness.  Lymphadenopathy:    She has no cervical adenopathy.  Neurological: She is alert and oriented to person, place, and time. Coordination normal.  Skin: Skin is warm and dry. No rash noted. No erythema.  Psychiatric: She has a normal mood and affect. Her behavior is normal. Judgment and thought content normal.    Assessment and Plan  Nursing note and vitals reviewed.

## 2014-08-16 ENCOUNTER — Other Ambulatory Visit: Payer: Self-pay | Admitting: *Deleted

## 2014-08-16 MED ORDER — TRAMADOL HCL 50 MG PO TABS: 50.0000 mg | ORAL_TABLET | Freq: Three times a day (TID) | ORAL | Status: DC | PRN

## 2014-08-16 NOTE — Telephone Encounter (Signed)
Refilled tramadol #90 with no refills.

## 2014-08-16 NOTE — Telephone Encounter (Signed)
Okay to refill Tramadol? Please advise. Last filled in November.

## 2014-08-22 NOTE — Telephone Encounter (Signed)
Late entry, Rx was faxed on 08/16/14

## 2014-09-11 ENCOUNTER — Other Ambulatory Visit: Payer: Self-pay

## 2014-09-11 MED ORDER — PANTOPRAZOLE SODIUM 40 MG PO TBEC: 40.0000 mg | DELAYED_RELEASE_TABLET | Freq: Every day | ORAL | Status: DC

## 2014-09-20 ENCOUNTER — Other Ambulatory Visit: Payer: Self-pay | Admitting: *Deleted

## 2014-09-20 MED ORDER — TRAMADOL HCL 50 MG PO TABS: 50.0000 mg | ORAL_TABLET | Freq: Three times a day (TID) | ORAL | Status: DC | PRN

## 2014-09-20 NOTE — Telephone Encounter (Signed)
Rx faxed

## 2014-09-20 NOTE — Telephone Encounter (Signed)
ok'd refill for tramadol #90 with no refills.

## 2014-09-20 NOTE — Telephone Encounter (Signed)
Pt requesting a refill on Tramadol. She has an appt coming up on 09/28/14 with a doctor to discuss pain & will talk about alternative therapy at that time. Please advise on refill.

## 2014-10-02 ENCOUNTER — Telehealth: Payer: Self-pay

## 2014-10-02 NOTE — Telephone Encounter (Signed)
Pt called back with fax number  [phone]   Please call when faxed over.   March 14,2016 is when the surgery is.

## 2014-10-02 NOTE — Telephone Encounter (Signed)
Left message for pt to call back  °

## 2014-10-02 NOTE — Telephone Encounter (Signed)
Letter is typed

## 2014-10-02 NOTE — Telephone Encounter (Signed)
On [employer] for signature.

## 2014-10-02 NOTE — Telephone Encounter (Signed)
Pt is having back surgery by Dr. Glenna Fellows at [facility] on 3/14 and needs clearance .

## 2014-10-03 NOTE — Telephone Encounter (Signed)
Faxed

## 2014-11-03 ENCOUNTER — Ambulatory Visit (INDEPENDENT_AMBULATORY_CARE_PROVIDER_SITE_OTHER): Payer: BLUE CROSS/BLUE SHIELD | Admitting: Internal Medicine

## 2014-11-03 ENCOUNTER — Encounter: Payer: Self-pay | Admitting: Internal Medicine

## 2014-11-03 VITALS — BP 130/90 | HR 64 | Temp 97.8°F | Ht 60.0 in | Wt 212.4 lb

## 2014-11-03 DIAGNOSIS — N289 Disorder of kidney and ureter, unspecified: Secondary | ICD-10-CM

## 2014-11-03 DIAGNOSIS — I4891 Unspecified atrial fibrillation: Secondary | ICD-10-CM

## 2014-11-03 DIAGNOSIS — R739 Hyperglycemia, unspecified: Secondary | ICD-10-CM

## 2014-11-03 DIAGNOSIS — E7800 Pure hypercholesterolemia, unspecified: Secondary | ICD-10-CM

## 2014-11-03 DIAGNOSIS — Z0000 Encounter for general adult medical examination without abnormal findings: Secondary | ICD-10-CM

## 2014-11-03 DIAGNOSIS — I10 Essential (primary) hypertension: Secondary | ICD-10-CM

## 2014-11-03 DIAGNOSIS — I743 Embolism and thrombosis of arteries of the lower extremities: Secondary | ICD-10-CM

## 2014-11-03 DIAGNOSIS — M4800 Spinal stenosis, site unspecified: Secondary | ICD-10-CM

## 2014-11-03 NOTE — Progress Notes (Signed)
Pre visit review using our clinic review tool, if applicable. No additional management support is needed unless otherwise documented below in the visit note. 

## 2014-11-06 ENCOUNTER — Encounter: Payer: Self-pay | Admitting: Internal Medicine

## 2014-11-06 MED ORDER — CICLOPIROX 8 % EX SOLN: Freq: Every day | CUTANEOUS | Status: DC

## 2014-11-06 NOTE — Telephone Encounter (Signed)
rx sent if for penlac.  Pt notified via my chart.

## 2014-11-08 ENCOUNTER — Encounter: Payer: Self-pay | Admitting: Internal Medicine

## 2014-11-08 ENCOUNTER — Ambulatory Visit (INDEPENDENT_AMBULATORY_CARE_PROVIDER_SITE_OTHER): Payer: BLUE CROSS/BLUE SHIELD | Admitting: Internal Medicine

## 2014-11-08 VITALS — BP 138/80 | HR 66 | Temp 98.2°F | Ht 60.0 in | Wt 211.0 lb

## 2014-11-08 DIAGNOSIS — J111 Influenza due to unidentified influenza virus with other respiratory manifestations: Secondary | ICD-10-CM

## 2014-11-08 DIAGNOSIS — I10 Essential (primary) hypertension: Secondary | ICD-10-CM

## 2014-11-08 MED ORDER — OSELTAMIVIR PHOSPHATE 75 MG PO CAPS: 75.0000 mg | ORAL_CAPSULE | Freq: Two times a day (BID) | ORAL | Status: DC

## 2014-11-08 MED ORDER — CEFUROXIME AXETIL 250 MG PO TABS: 250.0000 mg | ORAL_TABLET | Freq: Two times a day (BID) | ORAL | Status: DC

## 2014-11-08 NOTE — Patient Instructions (Addendum)
Saline nasal spray - flush nose at least 2-3x/day  nasacort nasal spray - 2 sprays each nostril one time per day.  Do this in the evening.   Mucinex in the am and robitussin DM in the evening.    Take align one per day

## 2014-11-08 NOTE — Progress Notes (Signed)
Pre visit review using our clinic review tool, if applicable. No additional management support is needed unless otherwise documented below in the visit note. 

## 2014-11-12 ENCOUNTER — Other Ambulatory Visit: Payer: Self-pay | Admitting: Internal Medicine

## 2014-11-12 ENCOUNTER — Encounter: Payer: Self-pay | Admitting: Internal Medicine

## 2014-11-12 DIAGNOSIS — Z0000 Encounter for general adult medical examination without abnormal findings: Secondary | ICD-10-CM | POA: Insufficient documentation

## 2014-11-12 NOTE — Assessment & Plan Note (Signed)
Appears to be in SR.  Sees Dr Rockey Situ.  Just decreased atenolol to 25mg  q day.  Follow.

## 2014-11-12 NOTE — Assessment & Plan Note (Signed)
Sugars attached - averaging (120-130s).  Continue a low carb diet.  Follow met b and a1c.   Lab Results  Component Value Date   HGBA1C 6.6* 06/13/2014

## 2014-11-12 NOTE — Assessment & Plan Note (Signed)
On lipitor.  Follow lipid panel and liver function tests.   Lab Results  Component Value Date   CHOL 169 06/13/2014   HDL 28.10* 06/13/2014   LDLCALC 102* 06/13/2014   TRIG 193.0* 06/13/2014   CHOLHDL 6 06/13/2014

## 2014-11-12 NOTE — Assessment & Plan Note (Signed)
Seeing Dr Carloyn Manner.  Planning for back surgery 11/13/14.  Per pt, has been cleared by cardiology.  Will have to stop Eloquis 7 days prior to surgery.  (has been ok'd by vascular and Dr Rockey Situ  - per pt).  Will need close intra op and post op monitoring of her heart rate and blood pressure to avoid extremes.  Will hold on making any medication adjustments.

## 2014-11-12 NOTE — Progress Notes (Signed)
Patient ID: Kristin James, female   DOB: 08-19-1956, 59 y.o.   MRN: [id number]   Subjective:    Patient ID: Kristin James, female    DOB: Apr 23, 1956, 59 y.o.   MRN: [id number]  HPI  Patient here for a scheduled follow up.  Is planning to have back surgery 11/13/14 (Dr Carloyn Manner).  Having a lumbar fusion.  Will have to be off Eloquis for seven days.  This has been cleared by vascular surgery and cardiology (per pt).  Sees Dr Rockey Situ for her atrial fibrillation.  He recent decreased her atenolol to 23m q day.  Outside blood pressure readings have been a little elevated (averaging 140-160/80-90s).  Pulse 60-70.  No chest pain or tightness.  No increased heart rate or palpitations.  Breathing stable. States other than her back, she feels good.  Is concerned regarding toe nail fungus.       Past Medical History  Diagnosis Date  . Hypertension   . Hypercholesteremia   . Hyperglycemia   . Osteoarthritis   . Proteinuria   . Hiatal hernia   . Spinal stenosis at L4-L5 level   . Peripheral arterial disease     embolic disease   . Embolus to lower extremity     Current Outpatient Prescriptions on File Prior to Visit  Medication Sig Dispense Refill  . acetaminophen (TYLENOL) 650 MG CR tablet Take 650 mg by mouth every 8 (eight) hours as needed for pain.    .Marland Kitchenamiodarone (PACERONE) 200 MG tablet Take 1 tablet (200 mg total) by mouth daily. 90 tablet 3  . apixaban (ELIQUIS) 5 MG TABS tablet Take 1 tablet (5 mg total) by mouth 2 (two) times daily. 180 tablet 3  . atenolol (TENORMIN) 25 MG tablet Take 1 tablet (25 mg total) by mouth daily. 90 tablet 3  . atorvastatin (LIPITOR) 40 MG tablet Take 1 tablet (40 mg total) by mouth daily. 90 tablet 3  . Cyanocobalamin (B-12 PO) Take by mouth.    . diltiazem (CARDIZEM CD) 120 MG 24 hr capsule Take 1 capsule (120 mg total) by mouth daily. 90 capsule 3  . losartan-hydrochlorothiazide (HYZAAR) 50-12.5 MG per tablet Take 1 tablet by mouth daily. 90 tablet 3  .  nitroGLYCERIN (NITROSTAT) 0.4 MG SL tablet Place 1 tablet (0.4 mg total) under the tongue every 5 (five) minutes as needed for chest pain. 25 tablet 3  . pantoprazole (PROTONIX) 40 MG tablet Take 1 tablet (40 mg total) by mouth daily. 30 tablet 5  . traMADol (ULTRAM) 50 MG tablet Take 1 tablet (50 mg total) by mouth 3 (three) times daily as needed. 90 tablet 0   No current facility-administered medications on file prior to visit.    Review of Systems  Constitutional: Negative for appetite change and unexpected weight change.  HENT: Negative for congestion and sinus pressure.   Respiratory: Negative for cough, chest tightness and shortness of breath.   Cardiovascular: Negative for chest pain, palpitations and leg swelling.  Gastrointestinal: Negative for nausea, vomiting, abdominal pain and diarrhea.  Genitourinary: Negative for dysuria and difficulty urinating.  Musculoskeletal: Positive for back pain. Negative for joint swelling.  Skin: Negative for color change and rash.  Neurological: Negative for dizziness, light-headedness and headaches.  Psychiatric/Behavioral: Negative for dysphoric mood and agitation.       Objective:    Physical Exam  Constitutional: She appears well-developed and well-nourished. No distress.  HENT:  Nose: Nose normal.  Mouth/Throat: Oropharynx is clear and moist.  Neck: Neck supple. No thyromegaly present.  Cardiovascular: Normal rate and regular rhythm.   Pulmonary/Chest: Breath sounds normal. No respiratory distress. She has no wheezes.  Abdominal: Soft. Bowel sounds are normal. There is no tenderness.  Musculoskeletal: She exhibits no edema or tenderness.  Lymphadenopathy:    She has no cervical adenopathy.  Skin: No rash noted. No erythema.    BP 130/90 mmHg  Pulse 64  Temp(Src) 97.8 F (36.6 C) (Oral)  Ht 5' (1.524 m)  Wt 212 lb 6 oz (96.333 kg)  BMI 41.48 kg/m2  SpO2 96% Wt Readings from Last 3 Encounters:  11/08/14 211 lb (95.709 kg)    11/03/14 212 lb 6 oz (96.333 kg)  08/01/14 211 lb 4 oz (95.822 kg)     Lab Results  Component Value Date   WBC 8.9 06/13/2014   HGB 15.3* 06/13/2014   HCT 46.0 06/13/2014   PLT 293.0 06/13/2014   GLUCOSE 118* 06/22/2014   CHOL 169 06/13/2014   TRIG 193.0* 06/13/2014   HDL 28.10* 06/13/2014   LDLCALC 102* 06/13/2014   ALT 27 06/13/2014   AST 27 06/13/2014   NA 137 06/22/2014   K 4.0 06/22/2014   CL 103 06/22/2014   CREATININE 1.2 06/22/2014   BUN 20 06/22/2014   CO2 28 06/22/2014   TSH 0.75 06/13/2014   INR 1.0 05/23/2014   HGBA1C 6.6* 06/13/2014   MICROALBUR 93.4* 12/13/2013       Assessment & Plan:   Problem List Items Addressed This Visit    Atrial fibrillation with RVR    Appears to be in SR.  Sees Dr Gollan.  Just decreased atenolol to 25mg q day.  Follow.        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  Off estrogen.  Sees vascular surgery.  See below regarding Eloquis.        Health care maintenance    Physical 02/13/14.  Mammogram 05/15/14 - Birads I.  Colonoscopy 06/2009.  Recommended f/u in five years.        Hypercholesterolemia    On lipitor.  Follow lipid panel and liver function tests.   Lab Results  Component Value Date   CHOL 169 06/13/2014   HDL 28.10* 06/13/2014   LDLCALC 102* 06/13/2014   TRIG 193.0* 06/13/2014   CHOLHDL 6 06/13/2014        Hyperglycemia    Sugars attached - averaging (120-130s).  Continue a low carb diet.  Follow met b and a1c.   Lab Results  Component Value Date   HGBA1C 6.6* 06/13/2014        Hypertension - Primary    Blood pressure as outlined.  Hold on making adjustments in her medication.  Will need close intra op and post op monitoring of her heart rate and blood pressure to avoid extremes.  Follow.  Check met b prior to surgery.        Renal insufficiency    Last Cr check here 1.2.  Rx given for met b prior to surgery.  Avoid  antiinflammatories.        Spinal stenosis    Seeing Dr Roy.  Planning for back surgery 11/13/14.  Per pt, has been cleared by cardiology.  Will have to stop Eloquis 7 days prior to surgery.  (has been ok'd by vascular and Dr Gollan  - per pt).  Will need close intra op and post op monitoring of   her heart rate and blood pressure to avoid extremes.  Will hold on making any medication adjustments.          I spent 25 minutes with the patient and more than 50% of the time was spent in consultation regarding the above.     SCOTT, CHARLENE, MD   

## 2014-11-12 NOTE — Assessment & Plan Note (Signed)
Blood pressure as outlined.  Hold on making adjustments in her medication.  Will need close intra op and post op monitoring of her heart rate and blood pressure to avoid extremes.  Follow.  Check met b prior to surgery.

## 2014-11-12 NOTE — Assessment & Plan Note (Signed)
Arterial thrombus felt to be likely from atrial fib.  Off estrogen.  Sees vascular surgery.  See below regarding Eloquis.

## 2014-11-12 NOTE — Assessment & Plan Note (Signed)
Blood pressure is doing better than last check here.  Follow.  Will need close intra op and post op monitoring of her heart rate and blood pressure to avoid extremes.

## 2014-11-12 NOTE — Progress Notes (Signed)
Patient ID: Kristin James, female   DOB: [birth year], 59 y.o.   MRN: [id number]   Subjective:    Patient ID: Kristin James, female    DOB: Jun 22, 1956, 59 y.o.   MRN: [id number]  HPI  Patient here for as a work in with concerns regarding increased cough, congestion and not feeling well.  Tmax 101.  Symptoms started a couple of days ago.  Started with headache.  This has resolved.  Fever as outlined.  Some increased cough.  Lying down worse.  Used Vicks.  Helped sinuses.  No vomiting.  No diarrhea.  No body aches.  Does not feel well.  Husband has been sick.  She is scheduled for surgery next week.     Past Medical History  Diagnosis Date  . Hypertension   . Hypercholesteremia   . Hyperglycemia   . Osteoarthritis   . Proteinuria   . Hiatal hernia   . Spinal stenosis at L4-L5 level   . Peripheral arterial disease     embolic disease   . Embolus to lower extremity     Current Outpatient Prescriptions on File Prior to Visit  Medication Sig Dispense Refill  . acetaminophen (TYLENOL) 650 MG CR tablet Take 650 mg by mouth every 8 (eight) hours as needed for pain.    Marland Kitchen amiodarone (PACERONE) 200 MG tablet Take 1 tablet (200 mg total) by mouth daily. 90 tablet 3  . apixaban (ELIQUIS) 5 MG TABS tablet Take 1 tablet (5 mg total) by mouth 2 (two) times daily. 180 tablet 3  . atenolol (TENORMIN) 25 MG tablet Take 1 tablet (25 mg total) by mouth daily. 90 tablet 3  . atorvastatin (LIPITOR) 40 MG tablet Take 1 tablet (40 mg total) by mouth daily. 90 tablet 3  . ciclopirox (PENLAC) 8 % solution Apply topically at bedtime. Apply over nail and surrounding skin. Apply daily over previous coat. After seven (7) days, may remove with alcohol and continue cycle. 6.6 mL 1  . Cyanocobalamin (B-12 PO) Take by mouth.    . diltiazem (CARDIZEM CD) 120 MG 24 hr capsule Take 1 capsule (120 mg total) by mouth daily. 90 capsule 3  . losartan-hydrochlorothiazide (HYZAAR) 50-12.5 MG per tablet Take 1 tablet by mouth  daily. 90 tablet 3  . nitroGLYCERIN (NITROSTAT) 0.4 MG SL tablet Place 1 tablet (0.4 mg total) under the tongue every 5 (five) minutes as needed for chest pain. 25 tablet 3  . pantoprazole (PROTONIX) 40 MG tablet Take 1 tablet (40 mg total) by mouth daily. 30 tablet 5  . traMADol (ULTRAM) 50 MG tablet Take 1 tablet (50 mg total) by mouth 3 (three) times daily as needed. 90 tablet 0   No current facility-administered medications on file prior to visit.    Review of Systems  Constitutional: Positive for fever (Tmax 101.  ) and fatigue. Negative for unexpected weight change.  HENT: Positive for congestion. Negative for sinus pressure (improved).   Respiratory: Positive for cough. Negative for chest tightness and shortness of breath.   Cardiovascular: Negative for chest pain, palpitations and leg swelling.  Gastrointestinal: Negative for nausea, vomiting, abdominal pain and diarrhea.  Skin: Negative for rash.  Neurological: Positive for headaches (better.  ). Negative for dizziness and light-headedness.       Objective:    Physical Exam  HENT:  Nose: Nose normal.  Mouth/Throat: Oropharynx is clear and moist. No oropharyngeal exudate.  Eyes: Conjunctivae are normal. Right eye exhibits no discharge. Left eye exhibits  discharge.  Neck: Neck supple. No thyromegaly present.  Cardiovascular: Normal rate and regular rhythm.   Pulmonary/Chest: Breath sounds normal. No respiratory distress. She has no wheezes.  Lymphadenopathy:    She has no cervical adenopathy.    BP 138/80 mmHg  Pulse 66  Temp(Src) 98.2 F (36.8 C) (Oral)  Ht 5' (1.524 m)  Wt 211 lb (95.709 kg)  BMI 41.21 kg/m2  SpO2 96% Wt Readings from Last 3 Encounters:  11/08/14 211 lb (95.709 kg)  11/03/14 212 lb 6 oz (96.333 kg)  08/01/14 211 lb 4 oz (95.822 kg)     Lab Results  Component Value Date   WBC 8.9 06/13/2014   HGB 15.3* 06/13/2014   HCT 46.0 06/13/2014   PLT 293.0 06/13/2014   GLUCOSE 118* 06/22/2014    CHOL 169 06/13/2014   TRIG 193.0* 06/13/2014   HDL 28.10* 06/13/2014   LDLCALC 102* 06/13/2014   ALT 27 06/13/2014   AST 27 06/13/2014   NA 137 06/22/2014   K 4.0 06/22/2014   CL 103 06/22/2014   CREATININE 1.2 06/22/2014   BUN 20 06/22/2014   CO2 28 06/22/2014   TSH 0.75 06/13/2014   INR 1.0 05/23/2014   HGBA1C 6.6* 06/13/2014   MICROALBUR 93.4* 12/13/2013       Assessment & Plan:   Problem List Items Addressed This Visit    Hypertension    Blood pressure is doing better than last check here.  Follow.  Will need close intra op and post op monitoring of her heart rate and blood pressure to avoid extremes.        Influenza - Primary    Flu swab - positive.  Treat with tamiflu.  Will also cover for overlying bacterial infection given upcoming surgery.  Treat with ceftin as directed.  Probiotic daily.  Saline nasal spray and nasacort nasal spray as directed.  Robitussin/mucinex as directed.  Follow.  Rest.  Fluids.  Explained infection will need to be clear prior to surgery.  She is going to make her surgeon aware.        Relevant Medications   cefUROXime (CEFTIN) tablet   oseltamivir (TAMIFLU) capsule       [name], MD

## 2014-11-12 NOTE — Assessment & Plan Note (Signed)
Last Cr check here 1.2.  Rx given for met b prior to surgery.  Avoid antiinflammatories.   

## 2014-11-12 NOTE — Assessment & Plan Note (Signed)
Flu swab - positive.  Treat with tamiflu.  Will also cover for overlying bacterial infection given upcoming surgery.  Treat with ceftin as directed.  Probiotic daily.  Saline nasal spray and nasacort nasal spray as directed.  Robitussin/mucinex as directed.  Follow.  Rest.  Fluids.  Explained infection will need to be clear prior to surgery.  She is going to make her surgeon aware.

## 2014-11-12 NOTE — Assessment & Plan Note (Signed)
Physical 02/13/14.  Mammogram 05/15/14 - Birads I.  Colonoscopy 06/2009.  Recommended f/u in five years.

## 2014-11-13 HISTORY — PX: BACK SURGERY: SHX140

## 2014-11-17 ENCOUNTER — Encounter: Payer: Self-pay | Admitting: Internal Medicine

## 2014-12-22 NOTE — Discharge Summary (Signed)
PATIENT NAME:  Kristin James, Kristin James MR#:  [id number] DATE OF BIRTH:  09/20/1955  DATE OF ADMISSION:  05/17/2013 DATE OF DISCHARGE:  05/18/2013  HISTORY AND PHYSICAL:  For a detailed note, please take a look at the history and physical done on admission by Dr. Max Sane.   DIAGNOSES AT DISCHARGE: 1.  Chest pain, likely related to gastroesophageal reflux disease/hiatal hernia,  2.  Elevated troponin likely in the setting of demand ischemia.  3.  Hypertension.  4.  Gastroesophageal reflux disease.  5.  Osteoarthritis.   DIET:  The patient is being discharged on a low sodium, low fat diet.   ACTIVITY: As tolerated.   FOLLOWUP:  Dr. [name] in the next 1 to 2 weeks.   DISCHARGE MEDICATIONS: Felodipine 10 mg daily, atenolol 100 mg daily, multivitamin daily, estrogen and methyl testosterone daily, hydrochlorothiazide/losartan 25/50 1 tab daily, atorvastatin 40 mg daily, progesterone 100 mg at bedtime, Protonix 40 mg daily, meloxicam 15 mg daily, sublingual nitroglycerin as needed, and hyoscyamine  0.125 mg as needed.   CONSULTANTS DURING THE HOSPITAL COURSE:  Dr. Ida Rogue from Cardiology.   PERTINENT STUDIES DONE DURING THE HOSPITAL COURSE ARE AS FOLLOWS: A chest x-ray done on admission showing no acute cardiopulmonary disease. A nuclear medicine myocardial scan done on 09/17 showing pharmacological myocardial perfusion, study with no significant ischemia, no significant wall motion abnormality, EF of 67%, no equivocal EKG changes.   HOSPITAL COURSE: This 59 year old female with medical problems as mentioned above presented to the hospital with chest pain.  1.  Chest pain with elevated troponin: The patient presented to the hospital with chest pain and having a mildly elevated troponin of 0.10. Initially, there was some concern that the patient may be having unstable angina, therefore, she was admitted to telemetry, started on aspirin, a beta blocker and also Lovenox. A Cardiology consult  was obtained to further see the patient.  The patient was seen by Dr. Rockey Situ and, as per him, the patient's troponin elevation was not likely related to unstable angina; therefore, the Lovenox was discontinued. The patient underwent a myocardial scan the day after admission which showed no evidence of acute myocardial ischemia or any new EKG changes or any myocardial perfusion problems. The patient is currently chest pain-free and hemodynamically stable, and, therefore, is being discharged home. The patient will have close followup with her primary care physician as an outpatient.   The cause of the chest pain seems like related to hiatal hernia or GERD, therefore the patient is being discharged on some sublingual nitroglycerin and also some hyoscyamine as needed.   2.  Elevated troponin: This is probably secondary to demand ischemia. No evidence of acute coronary syndrome given her negative Myoview, and her  troponins did not actually trend upwards.  They remained as low as 0.12.  3.  Hypertension: The patient remained hemodynamically stable. She will continue her atenolol, felodipine and HCTZ and losartan as stated.  4.  Gastroesophageal reflux disease:  The patient will be maintained on her Protonix.  5.  Osteoarthritis:  The patient was maintained on her meloxicam. She will resume that.  6.  Hyperlipidemia: The patient was maintained on her atorvastatin.  She will also resume that.   CODE STATUS: The patient is a FULL CODE.   TIME SPENT ON DISCHARGE: Is 35 minutes.   ____________________________ Belia Heman. Verdell Carmine, MD [email or web address] D: 05/18/2013 15:26:00 ET T: 05/18/2013 21:15:02 ET JOB#: [id number]  cc: Belia Heman. Verdell Carmine, MD, <Dictator> [name], MD  Henreitta Leber MD ELECTRONICALLY SIGNED 05/19/2013 17:27

## 2014-12-22 NOTE — Consult Note (Signed)
General Aspect Kristin James is a 59yo Caucasian female w/ no prior cardiac history, PMHx s/f HTN, HLD, obesity, GERD and h/o a "heart murmur" who was admitted to [facility] yesterday for chest pain.   Kristin James was in her USOH until last Wednesday when Kristin James experienced 4-5 hours of substernal, sharp, localized chest discomfort w/o radiation w/ associated SOB, diaphoresis and lightheadedness after eating beef late at night. The discomfort subsided the following day, but recurred on Friday after eating popcorn. Kristin James noticed associated belching with a subsequent change in the discomfort. Kristin James has a h/o reflux for which Kristin James takes Protonix. This discomfort was similar in quality, but more severe. The discomfort alleviated over the weekend, but woke her from sleep around 0600 yesterday. Kristin James presented to her PCP's office. By the time Kristin James arrived, the discomfort was gone. Kristin James was advised to present to the ED. Denies exertional cp, PND, LE edema, orthopnea, weight gain, palpitations or syncope.   Present Illness There, EKG revealed NSR, ST depressions V5, V6, I, II, aVF and isolated ST elevation aVR (more prominent from prior tracings). Initial trop-I returned mildly elevated at 0.10. BNP 254. CMET/CBC otherwise unremarkable. CXR indicated no acute process. Kristin James was admitted overnight by medicine. TnI trend 0.12->0.12 overnight. Kristin James's had no further discomfort. Lipid panel- LDL 66, HDL 25, TG 158, TC 123. Lexiscan Myoview has been ordered for this AM.   PAST MEDICAL HISTORY: 1.  Hypertension.  2.  Hyperlipidemia. 3.  History of heart murmur. 4.  Kidney stone.   PAST SURGICAL HISTORY: Gallbladder removal. Knee surgery. Lithotripsy.   ALLERGIES: No known drug allergies.   SOCIAL HISTORY: No smoking. Occasional alcohol. 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   FAMILY HISTORY: Father had stroke at the age of 8, also had hypertension. Mother's side with significant heart disease- uncles with MIs in 49s.   Physical Exam:  GEN  no acute distress, obese   HEENT pink conjunctivae, PERRL   NECK supple  No masses  trachea midline   RESP normal resp effort  clear BS  no use of accessory muscles   CARD Regular rate and rhythm  Normal, S1, S2  No murmur   ABD denies tenderness  soft  normal BS   EXTR negative cyanosis/clubbing, negative edema   SKIN normal to palpation   NEURO follows commands, motor/sensory function intact   PSYCH alert, A+O to time, place, person   Review of Systems:  Subjective/Chief Complaint chest pain   General: No Complaints  diaphoresis   Skin: No Complaints    ENT: No Complaints    Eyes: No Complaints    Neck: No Complaints    Respiratory: No Complaints  Short of breath   Cardiovascular: Chest pain or discomfort   Gastrointestinal: Heartburn  burping   Genitourinary: No Complaints    Vascular: No Complaints    Musculoskeletal: No Complaints    Neurologic: Dizzness   Hematologic: No Complaints    Endocrine: No Complaints    Psychiatric: No Complaints    Review of Systems: All other systems were reviewed and found to be negative   Medications/Allergies Reviewed Medications/Allergies reviewed        Heart murmur:    Lithotripsy:    Kidney stones:    Hyperlipidemia:    HTN:    Cholecystectomy:   Home Medications: Medication Instructions Status  estrogen-methytestos 1 daily Active  multivitamin 1 daily Active  atenolol 100 mg oral tablet 1 tab(s) orally once a day  Active  felodipine 10  mg oral tablet, extended release 1 tab(s) orally once a day  Active  hydrochlorothiazide-losartan 25 mg-100 mg oral tablet 1 tab(s) orally once a day Active  atorvastatin 40 mg oral tablet 1 tab(s) orally once a day (at bedtime) Active  progesterone 100 mg oral capsule 1 cap(s) orally once a day (at bedtime) Active  pantoprazole 40 mg oral delayed release tablet 1 tab(s) orally once a day Active  meloxicam 15 mg oral tablet 1 tab(s) orally once a day Active    Lab Results:  Hepatic:  16-Sep-14 10:28   Bilirubin, Total 0.8  Alkaline Phosphatase 103  SGPT (ALT) 27  SGOT (AST) 28  Total Protein, Serum 7.4  Albumin, Serum 3.6  Routine Chem:  16-Sep-14 10:28   Result Comment Troponin - RESULTS VERIFIED BY REPEAT TESTING.  - Critical Value called to and read back b  - y Okey Regal in ER @ 1130 9/16/  - 14 by [facility]  Result(s) reported on 17 May 2013 at 11:30AM.  Glucose, Serum  141  BUN  30  Creatinine (comp) 1.16  Sodium, Serum 138  Potassium, Serum 4.0  Chloride, Serum 106  CO2, Serum 23  Calcium (Total), Serum 9.7  Anion Gap 9  Osmolality (calc) 284  eGFR (African American) >60  eGFR (Non-African American)  52 (eGFR values <98m/min/1.73 m2 may be an indication of chronic kidney disease (CKD). Calculated eGFR is useful in patients with stable renal function. The eGFR calculation will not be reliable in acutely ill patients when serum creatinine is changing rapidly. It is not useful in  patients on dialysis. The eGFR calculation may not be applicable to patients at the low and high extremes of body sizes, pregnant women, and vegetarians.)  B-Type Natriuretic Peptide ([facility])  254 (Result(s) reported on 17 May 2013 at 11:21AM.)  Cardiac:  16-Sep-14 10:28   Troponin I  0.10 (0.00-0.05 0.05 ng/mL or less: NEGATIVE  Repeat testing in 3-6 hrs  if clinically indicated. >0.05 ng/mL: POTENTIAL  MYOCARDIAL INJURY. Repeat  testing in 3-6 hrs if  clinically indicated. NOTE: An increase or decrease  of 30% or more on serial  testing suggests a  clinically important change)  CK, Total 117  CPK-MB, Serum 1.3    18:12   Troponin I  0.12 (0.00-0.05 0.05 ng/mL or less: NEGATIVE  Repeat testing in 3-6 hrs  if clinically indicated. >0.05 ng/mL: POTENTIAL  MYOCARDIAL INJURY. Repeat  testing in 3-6 hrs if  clinically indicated. NOTE: An increase or decrease  of 30% or more on serial  testing suggests a  clinically important  change)  17-Sep-14 02:54   Troponin I  0.12 (0.00-0.05 0.05 ng/mL or less: NEGATIVE  Repeat testing in 3-6 hrs  if clinically indicated. >0.05 ng/mL: POTENTIAL  MYOCARDIAL INJURY. Repeat  testing in 3-6 hrs if  clinically indicated. NOTE: An increase or decrease  of 30% or more on serial  testing suggests a  clinically important change)  Routine Coag:  16-Sep-14 10:28   Prothrombin 12.3  INR 0.9 (INR reference interval applies to patients on anticoagulant therapy. A single INR therapeutic range for coumarins is not optimal for all indications; however, the suggested range for most indications is 2.0 - 3.0. Exceptions to the INR Reference Range may include: Prosthetic heart valves, acute myocardial infarction, prevention of myocardial infarction, and combinations of aspirin and anticoagulant. The need for a higher or lower target INR must be assessed individually. Reference: The Pharmacology and Management of the Vitamin K  antagonists:  the seventh ACCP Conference on Antithrombotic and Thrombolytic Therapy. NIOEV.0350 Sept:126 (3suppl): [id number]. A HCT value >55% may artifactually increase the PT.  In one study,  the increase was an average of 25%. Reference:  "Effect on Routine and Special Coagulation Testing Values of Citrate Anticoagulant Adjustment in Patients with High HCT Values." American Journal of Clinical Pathology 2006;126:400-405.)  Activated PTT (APTT) 23.8 (A HCT value >55% may artifactually increase the APTT. In one study, the increase was an average of 19%. Reference: "Effect on Routine and Special Coagulation Testing Values of Citrate Anticoagulant Adjustment in Patients with High HCT Values." American Journal of Clinical Pathology 2006;126:400-405.)  Routine Hem:  16-Sep-14 10:28   WBC (CBC) 10.7  RBC (CBC)  5.31  Hemoglobin (CBC)  16.4  Hematocrit (CBC) 46.3  Platelet Count (CBC) 241 (Result(s) reported on 17 May 2013 at 10:43AM.)  MCV 87  MCH 30.9   MCHC 35.4  RDW 12.9  17-Sep-14 02:54   Basophil % 0.8   EKG:  Interpretation EKG shows NSR, ST depressions V5, V6, I, II, aVF and isolated ST elevation aVR (more prominent from prior tracings)   Rate 80   EKG Comparision Changed from  12/2006 tracing   Radiology Results: XRay:    16-Sep-14 11:02, Chest Portable Single View  Chest Portable Single View   REASON FOR EXAM:    Chest Pain  COMMENTS:       PROCEDURE: DXR - DXR PORTABLE CHEST SINGLE VIEW  - May 17 2013 11:02AM     RESULT: Single view is lordotic and underexposed. The lungs are clear.   The heart and pulmonary vessels are normal. The bony and mediastinal   structures are unremarkable. There is no effusion. There is no   pneumothorax or evidence of congestive failure.    IMPRESSION:  No acute cardiopulmonary disease.    Dictation Site: 2      Verified By: Sundra Aland, M.D., MD    NKDA: None  Vital Signs/Nurse's Notes: **Vital Signs.:   17-Sep-14 08:05  Vital Signs Type Pre Medication  Temperature Temperature (F) 98.5  Celsius 36.9  Pulse Pulse 63  Respirations Respirations 18  Systolic BP Systolic BP 093  Diastolic BP (mmHg) Diastolic BP (mmHg) 78  Mean BP 96  Pulse Ox % Pulse Ox % 96  Pulse Ox Activity Level  At rest  Oxygen Delivery Room Air/ 21 %    Impression 59yo Caucasian female w/ no prior cardiac history, PMHx s/f HTN, HLD, obesity, GERD and h/o a "heart murmur" who was admitted to [facility] yesterday for chest pain.   1. Chest pain, moderate risk for cardiac source The patient endorses 4-5 hours of sharp, localized, substernal chest pain associated with meals which changes quality with belching and is reminiscent of prior reflux pain. Kristin James does note associated diaphoresis, SOB and lightheadedness. Subjectively, her symptoms are most consistent with GERD/reflux etiologies, however, the patient is at a moderate pretest probability given cardiac RFs in obesity, HTN and HLD. EKG does indicate ST  depressions V5, V6, I, II, and aVF and isolated ST elevation in aVR. Mild troponin elevation non-specific, but could represent the downtrend from a MI last week. Kristin James has been pain free this hospitalization.  -- Proceed with Lexiscan Myoview this AM for further evaluation -- Switch to low-dose ASA, continue BB, ACEi, statin -- Add NTG SL PRN -- Discussed PPI, H2 blockers and antacids for GERD alleviation  2. Hypertension Well-controlled.  -- Continue current antihypertensives  3. Hyperlipidemia Well-controlled. LDL <  70.  -- Continue atorvastatin 40  4. Obesity Recommend diet and exercise as a means of risk factor modification  5. Heart murmur  No appreciable murmur on exam.   Electronic Signatures: Zay Yeargan A (PA-C)  (Signed 17-Sep-14 09:12)  Authored: General Aspect/Present Illness, History and Physical Exam, Review of System, Home Medications, Labs, EKG , Radiology, Allergies, Vital Signs/Nurse's Notes, Impression/Plan Ida Rogue (MD)  (Signed 17-Sep-14 13:52)  Authored: Review of System, Past Medical History, Labs, EKG  Co-Signer: General Aspect/Present Illness, Review of System, Home Medications, Labs, Allergies, Vital Signs/Nurse's Notes, Impression/Plan   Last Updated: 17-Sep-14 13:52 by Ida Rogue (MD)

## 2014-12-22 NOTE — H&P (Signed)
PATIENT NAME:  Kristin James, Kristin James MR#:  [id number] DATE OF BIRTH:  1956-08-21  DATE OF ADMISSION:  05/17/2013  PRIMARY CARE PHYSICIAN: Dr. [name].  REQUESTING PHYSICIAN: Dr. Graciella Freer.   CHIEF COMPLAINT: Chest pain.   HISTORY OF PRESENT ILLNESS: The patient is a 59 year old female with a known history of hypertension and hyperlipidemia who is being admitted for suspected unstable angina. The patient started having some chest pressure last Wednesday, and she thought it may be indigestion. That was Wednesday night, got better on Thursday, again started having symptoms on Friday and got better over the weekend. This morning again she woke up around 6:00 a.m. with severe chest pressure along with some labored breathing. She was feeling like having a chest pressure in the midsternal region. She was also feeling lightheaded, diaphoretic, and had some belching symptoms. She decided to come to the Emergency Department. While in the ED, she was found to have a troponin of 0.10, but her chest symptoms were resolved. She did not have any radiation of this pain. She is being admitted for further evaluation and management.   PAST MEDICAL HISTORY: 1.  Hypertension.  2.  Hyperlipidemia. 3.  History of heart murmur. 4.  Kidney stone.   PAST SURGICAL HISTORY: Gallbladder removal.   ALLERGIES: No known drug allergies.   SOCIAL HISTORY: No smoking. Occasional alcohol. 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   FAMILY HISTORY: Father had stroke at the age of 57, also had hypertension. Mother's side with significant heart disease.   MEDICATIONS AT HOME: 1.  Atenolol 100 mg p.o. daily.  2.  Lipitor 40 mg p.o. at bedtime.  3.  Estrogen/methyltestosterone once daily.  4.  Felodipine 10 mg p.o. daily.  5.  Hydrochlorothiazide/losartan 25/100 mg 1 tablet p.o. daily.  6.  Meloxicam 15 mg p.o. daily.  7.  Multivitamin once daily. 8.  Protonix 40 mg p.o. daily. 9.  Progesterone 100 mg p.o. at bedtime.    REVIEW OF SYSTEMS: CONSTITUTIONAL: No fever, fatigue, weakness.  EYES: No blurred or double vision.  ENT: No tinnitus or ear pain.  RESPIRATORY: No cough, wheezing, hemoptysis.  CARDIOVASCULAR: Positive for chest pain, orthopnea, edema.  GASTROINTESTINAL: No nausea, vomiting or diarrhea. Positive for belching.  GENITOURINARY: No dysuria or hematuria. ENDOCRINE: No polyuria or nocturia. HEMATOLOGIC: No anemia or easy bruising SKIN: No rash or lesion.  MUSCULOSKELETAL: No arthritis or muscle cramp.  NEUROLOGIC: No tingling, numbness, weakness.  PSYCHIATRIC: No history of anxiety or depression.   PHYSICAL EXAMINATION: VITAL SIGNS: Temperature 97.8, heart rate 80 per minute, respiration 20 per minute, blood pressure 126/68 mmHg. She is saturating 97% on room air.  GENERAL: The patient is a 59 year old female lying in the bed comfortably without any acute distress.  EYES: Pupils equal, round, reactive to light and accommodation. No scleral icterus. Extraocular muscles intact.  HEENT: Head atraumatic, normocephalic. Oropharynx and nasopharynx clear. NECK: Supple. No jugular venous distention. No thyroid enlargement or tenderness.  LUNGS: Clear to auscultation bilaterally. No wheezing, rales, rhonchi or crepitation.  CARDIOVASCULAR: S1, S2 normal. No murmurs, rubs or gallops.  ABDOMEN: Soft, nontender, nondistended. Bowel sounds present. No organomegaly or mass.  EXTREMITIES: No pedal edema, cyanosis or clubbing.  NEUROLOGIC: Nonfocal examination. Cranial nerves II through XII intact. Muscle strength 5 out of 5 in all extremities. Sensation is intact. PSYCHIATRIC: The patient is alert and oriented x 3.  SKIN: No obvious rash, lesion or ulcer.  MUSCULOSKELETAL: No joint effusion.   LABORATORY, DIAGNOSTIC AND RADIOLOGICAL  DATA: Normal BMP. Normal liver function tests. Normal CBC except hemoglobin of 16.4. Normal first set of cardiac enzymes except troponin of 0.10. Normal coagulation panel.    Chest x-ray showed no acute cardiopulmonary disease in the ED.   EKG showed normal sinus rhythm with no major ST-T changes.   IMPRESSION AND PLAN: 1.  Suspected unstable angina: Will do serial cardiac enzymes. Consult cardiology. Discussed the case with Dr. Rockey Situ. Will start her on aspirin and nitroglycerin. Continue beta blocker. Will order Myoview for tomorrow morning if her enzymes stay negative. She was given one-time dose of Lovenox in the Emergency Department. 2.  Hypertension: Stable on home medication. Will continue.  3.  Borderline elevated cardiac enzymes, likely due to supply-demand ischemia but cannot rule out myocardial infarction at this time. Will monitor on telemetry. Start on aspirin, nitroglycerin, beta blockers. Rule her out with serial troponins. She is already on statin. Will continue. Will check fasting lipid profile. Consult cardiology.  4.  Hyperlipidemia: Will continue statin. Check fasting lipid profile.   CODE STATUS: Full code.   Total time taking care of this patient: 55 minutes.    ____________________________ Lucina Mellow. Manuella Ghazi, MD vss:jm D: 05/17/2013 12:46:00 ET T: 05/17/2013 14:08:40 ET JOB#: [id number]  cc: Aleasha Fregeau S. Manuella Ghazi, MD, <Dictator> [name], MD Minna Merritts, MD [place] MD ELECTRONICALLY SIGNED 05/18/2013 15:02

## 2014-12-23 NOTE — Consult Note (Signed)
CHIEF COMPLAINT and HISTORY:  Subjective/Chief Complaint leg numbness, pain   History of Present Illness Patient is admitted early this am with complaints of pain and numbness of the left leg and foot.  This started about 9 last night and was the worst before coming to the hospital.  This improved even before starting anticoagulation, and this morning it feels much better.  She has not walked or put weight or pressure on the leg however. she has had chronic issues with pain and weakness as well as difficulty walking felt to be mostly due to spinal stenosis previously.  Is seen by Neurosurgeon for this and actually may need surgery.  Recently was on a steroid taper and had lots of N/V and poor po intake.  when arrived in ER has AKI with markedly reduced GFR.  No right leg symptoms.  Denies knowledge of irregular HR.   PAST MEDICAL/SURGICAL HISTORY:  Past Medical History:   Heart murmur:    Lithotripsy:    Kidney stones:    Hyperlipidemia:    HTN:    Cholecystectomy:   ALLERGIES:  Allergies:  NKDA: None  HOME MEDICATIONS:  Home Medications: Medication Instructions Status  hyoscyamine 0.125 mg oral tablet, disintegrating 1 tab(s) orally 4 times a day, As Needed Active  Nitrostat 0.4 mg sublingual tablet 1 tab(s) sublingual every 5 minutes, As Needed Active  estrogen-methytestos 1 daily Active  multivitamin 1 daily Active  atenolol 100 mg oral tablet 1 tab(s) orally once a day  Active  felodipine 10 mg oral tablet, extended release 1 tab(s) orally once a day  Active  Lotrisone 0.05%-1% topical cream Apply topically to affected area 2 times a day Active  Robaxin 500 mg oral tablet 1 tab(s) orally every 8 hours, As Needed - for Pain Active  hydrochlorothiazide-losartan 25 mg-100 mg oral tablet 1 tab(s) orally once a day Active  atorvastatin 40 mg oral tablet 1 tab(s) orally once a day (at bedtime) Active  pantoprazole 40 mg oral delayed release tablet 1 tab(s) orally once a day Active   meloxicam 15 mg oral tablet 1 tab(s) orally once a day Active   Family and Social History:  Family History Hypertension  Stroke   Social History negative tobacco, negative Illicit drugs   Place of Living Home   Review of Systems:  Subjective/Chief Complaint as per HPI No skin lesions. No heat or cold intolerance No anxiety or depression No anemia or easy bruising No tinnitus or ear pain No visual symptoms   Fever/Chills No   Cough No   Sputum No   Abdominal Pain No   Diarrhea No   Constipation No   Nausea/Vomiting Yes   SOB/DOE No   Chest Pain No   Telemetry Reviewed NSR   Dysuria No   Tolerating PT Yes   Tolerating Diet Yes   Medications/Allergies Reviewed Medications/Allergies reviewed   Physical Exam:  GEN well developed, well nourished, no acute distress   HEENT pink conjunctivae, hearing intact to voice   NECK No masses  trachea midline   RESP normal resp effort  no use of accessory muscles   CARD regular rate  murmur present  no JVD   VASCULAR ACCESS none   ABD denies tenderness  soft   GU clear yellow urine draining  no superpubic tenderness   LYMPH negative neck, negative axillae   EXTR negative cyanosis/clubbing, negative edema, left foot slightly cooler than the right.  Normal cap refill bilaterally.  Right pedal pulses 1-2+.  Left  DP trace, PT NP.   SKIN normal to palpation, No rashes, skin turgor good   NEURO cranial nerves intact, motor/sensory function intact   PSYCH alert, A+O to time, place, person   LABS:  Laboratory Results: Routine Chem:    19-Sep-15 19:37, Basic Metabolic Panel (w/Total Calcium)  Glucose, Serum 136  BUN 43  Creatinine (comp) 1.99  Sodium, Serum 143  Potassium, Serum 3.2  Chloride, Serum 108  CO2, Serum 22  Calcium (Total), Serum 8.6  Anion Gap 13  Osmolality (calc) 298  eGFR (African American) 31  eGFR (Non-African American) 27  eGFR values <36m/min/1.73 m2 may be an indication of  chronic  kidney disease (CKD).  Calculated eGFR is useful in patients with stable renal function.  The eGFR calculation will not be reliable in acutely ill patients  when serum creatinine is changing rapidly. It is not useful in   patients on dialysis. The eGFR calculation may not be applicable  to patients at the low and high extremes of body sizes, pregnant  women, and vegetarians.  Routine Coag:    19-Sep-15 23:20, Activated PTT  Activated PTT (APTT) 23.1  A HCT value >55% may artifactually increase the APTT. In one study,  the increase was an average of 19%.  Reference: "Effect on Routine and Special Coagulation Testing Values  of Citrate Anticoagulant Adjustment in Patients with High HCT Values."  American Journal of Clinical Pathology 2006;126:400-405.    19-Sep-15 23:20, Prothrombin Time  Prothrombin 12.6  INR 1.0  INR reference interval applies to patients on anticoagulant therapy.  A single INR therapeutic range for coumarins is not optimal for all  indications; however, the suggested range for most indications is  2.0 - 3.0.  Exceptions to the INR Reference Range may include: Prosthetic heart  valves, acute myocardial infarction, prevention of myocardial  infarction, and combinations of aspirin and anticoagulant. The need  for a higher or lower target INR must be assessed individually.  Reference: The Pharmacology and Management of the Vitamin K   antagonists: the seventh ACCP Conference on Antithrombotic and  Thrombolytic Therapy. CTKWIO.9735Sept:126 (3suppl): [id number]  A HCT value >55% may artifactually increase the PT.  In one study,   the increase was an average of 25%.  Reference:  "Effect on Routine and Special Coagulation Testing Values  of Citrate Anticoagulant Adjustment in Patients with High HCT Values."  American Journal of Clinical Pathology 2006;126:400-405.  Routine Hem:    19-Sep-15 23:20, CBC Profile  WBC (CBC) 15.1  RBC (CBC) 4.82  Hemoglobin (CBC)  14.6  Hematocrit (CBC) 42.8  Platelet Count (CBC) 180  MCV 89  MCH 30.2  MCHC 34.1  RDW 13.3  Neutrophil % 77.4  Lymphocyte % 14.7  Monocyte % 5.7  Eosinophil % 1.4  Basophil % 0.8  Neutrophil # 11.7  Lymphocyte # 2.2  Monocyte # 0.9  Eosinophil # 0.2  Basophil # 0.1  Result(s) reported on 20 May 2014 at 11:45PM.   RADIOLOGY:  Radiology Results: LabUnknown:    17-Mar-15 09:18, MRI Lumbar Spine Without Contrast  PACS Image    14-Sep-15 14:32, Screening Digital Mammogram  PACS Image  MRI:    17-Mar-15 09:18, MRI Lumbar Spine Without Contrast  MRI Lumbar Spine Without Contrast  REASON FOR EXAM:    Leg Weakness Lt Foot Drop  COMMENTS:       PROCEDURE: MMR - MMR LUMBAR SPINE WO CONTRAST  - Nov 15 2013  9:18AM     CLINICAL DATA:  Low  back pain and numbness. Bilateral leg pain and  weakness. Left foot drop.    EXAM:  MRI LUMBARSPINE WITHOUT CONTRAST    TECHNIQUE:  Multiplanar, multisequence MR imaging was performed. No intravenous  contrast was administered.  COMPARISON:  MR LUMBAR SPINE W/O CM dated 01/22/2011    FINDINGS:  The lowest lumbar type non-rib-bearing vertebra is labeled as L5.  The conus medullaris appears normal. Conus level: L1.    Abnormally low intervertebral disc height again observed at L3-4.  There is disc desiccation at L3-4 and L4-5. No vertebral subluxation  is observed. Abnormal facet and pedicleedema noted on the right at  L4-5 and L5-S1.    Additional findings at individual levels are as follows:    L1-2: Unremarkable.  L2-3: No impingement. Bilateral facet arthropathy and left eccentric  disc bulge.    L3-4: Mild right and borderline leftL3 nerve displacement along  with mild left and borderline right subarticular lateral recess  stenosis secondary to disc bulge and facet arthropathy. Mild central  thecal sac narrowing.    L4-5: Moderate central narrowing of the thecal sac with mild left  and borderline right displacement of the L4  nerves due to diffuse  disc bulge and facet arthropathy.    L5-S1: Prominent central narrowing of the thecal sac with moderate  to prominent left and mild right foraminal stenosis and prominent  right and moderate left subarticular lateral recess stenosis due to  facet arthropathy, intervertebral spurring, disc bulge, and left  foraminal disc protrusion, and synovial cyst tracking along the  right side of the thecal sac and in the right lateral recess.  Cross-sectional area of the thecal sac is narrowed to 0.25 cm^2.     IMPRESSION:  1. Lumbar spondylosis and degenerative disc disease, causing  prominent impingement at L5-S1, moderate impingement at L4-5, and  mild impingement at L3-4, as detailed above.      Electronically Signed    By: Sherryl Barters M.D.    On: 11/15/2013 10:36     Verified By: Carron Curie, M.D.,  [facility]:    14-Sep-15 14:32, Screening Digital Mammogram  Screening Digital Mammogram  REASON FOR EXAM:    SCR MAMMO NO ORDER  COMMENTS:       PROCEDURE: MAM - MAM DGTL SCRN MAM NO ORDER W/CAD  - May 15 2014  2:32PM     CLINICAL DATA:  Screening.    EXAM:  DIGITAL SCREENING BILATERAL MAMMOGRAM WITH CAD    COMPARISON:  Previous exam(s).    ACR Breast Density Category c: The breast tissue is heterogeneously  dense, which may obscure small masses.  FINDINGS:  There are no findings suspicious for malignancy. Images were  processed with CAD.     IMPRESSION:  No mammographic evidence of malignancy. A result letter of this  screening mammogram will be mailed directly to the patient.    RECOMMENDATION:  Screening mammogram in one year. (Code:[id number]-B-01Y)    BI-RADS CATEGORY  1: Negative.      Electronically Signed    By: Lovey Newcomer M.D.    On: 05/15/2014 16:02         Verified By: Ilsa Iha, M.D.,   ASSESSMENT AND PLAN:  Assessment/Admission Diagnosis LLE ischemia.  Possibly acute on chronic, possibly acute.  Initially had pain  but this has resolved after heparin started.  Perfusion appears slightly decreased, but the limb is not immediately threated. AKI HTN Spinal stenosis.  May have masked PAD symptoms chronically.  Hyperlipidemia   Plan Would generally proceed with angiogram, but her AKI precludes doing that safely today. Discussed with patient and husband the situation Will recheck her renal function later today/tomorrow and if the AKI resolves, may be able to safely proceed with angiogram tomorrow afternoon or Tuesday. Her limb is not imminently threatened, and if more time is needed for AKI to resolve, would keep on heparin and delay angiogram until later in the week Will follow    level 4 consult   Electronic Signatures: Algernon Huxley (MD)  (Signed 20-Sep-15 12:19)  Authored: Chief Complaint and History, PAST MEDICAL/SURGICAL HISTORY, ALLERGIES, HOME MEDICATIONS, Family and Social History, Review of Systems, Physical Exam, LABS, RADIOLOGY, Assessment and Plan   Last Updated: 20-Sep-15 12:19 by Algernon Huxley (MD)

## 2014-12-23 NOTE — Discharge Summary (Signed)
Dates of Admission and Diagnosis:  Date of Admission 21-May-2014   Date of Discharge 23-May-2014   Admitting Diagnosis peripheral arterial disease.   Final Diagnosis peripheral arterial embolic disease- embolectomy done by Dr. Lucky Cowboy. Htn ARF- improved.    Chief Complaint/History of Present Illness a 58 year old Caucasian female who presents to the Emergency Department with a cold, painful left foot. The patient states that it began approximately 4 to 5 hours prior to arrival. She thought that the pain in her foot that was radiating up her leg was from her back. She has a history of spinal stenosis and thus thought the symptoms may be paresthesias arising from that condition. She was able to eat dinner, but the pain became too much to bear. She came to the Emergency Department where the Emergency Department staff and nursing verified that there was no dorsalis pedis pulse and that her foot, indeed, was pale and cold to touch. Since arrival in the Emergency Department, the patient's foot has warmed some and she states that the pain has decreased significantly. She still feels subjective coldness the foot. Dr. Lucky Cowboy from vascular surgery was consulted, who recommended the patient be started on heparin drip and admitted for possible surgical consultation which prompted the Emergency Department to call for admission.   Allergies:  NKDA: None  Routine Chem:  19-Sep-15 23:20   Glucose, Serum  136  BUN  43  Creatinine (comp)  1.99  Sodium, Serum 143  Potassium, Serum  3.2  Chloride, Serum  108  CO2, Serum 22  Calcium (Total), Serum 8.6  Anion Gap 13  Osmolality (calc) 298  eGFR (African American)  31  eGFR (Non-African American)  27 (eGFR values <35m/min/1.73 m2 may be an indication of chronic kidney disease (CKD). Calculated eGFR is useful in patients with stable renal function. The eGFR calculation will not be reliable in acutely ill patients when serum creatinine is changing rapidly. It is  not useful in  patients on dialysis. The eGFR calculation may not be applicable to patients at the low and high extremes of body sizes, pregnant women, and vegetarians.)  21-Sep-15 09:28   Glucose, Serum  117  BUN 18  Creatinine (comp) 1.00  Sodium, Serum 141  Potassium, Serum 4.2  Chloride, Serum  115  CO2, Serum  17  Calcium (Total), Serum  7.2  Anion Gap 9  Osmolality (calc) 284  eGFR (African American) >60  eGFR (Non-African American) >60 (eGFR values <617mmin/1.73 m2 may be an indication of chronic kidney disease (CKD). Calculated eGFR is useful in patients with stable renal function. The eGFR calculation will not be reliable in acutely ill patients when serum creatinine is changing rapidly. It is not useful in  patients on dialysis. The eGFR calculation may not be applicable to patients at the low and high extremes of body sizes, pregnant women, and vegetarians.)  Result Comment POTASSIUM/CREATININE - Slight hemolysis, interpret results with  - caution.  Result(s) reported on 22 May 2014 at 09:42AM.  22-Sep-15 05:40   Glucose, Serum 90  BUN 16  Creatinine (comp) 1.07  Sodium, Serum 142  Potassium, Serum 3.7  Chloride, Serum  114  CO2, Serum 21  Calcium (Total), Serum  7.4  Anion Gap 7  Osmolality (calc) 284  eGFR (African American) >60  eGFR (Non-African American)  57 (eGFR values <6031min/1.73 m2 may be an indication of chronic kidney disease (CKD). Calculated eGFR is useful in patients with stable renal function. The eGFR calculation will not be reliable in  acutely ill patients when serum creatinine is changing rapidly. It is not useful in  patients on dialysis. The eGFR calculation may not be applicable to patients at the low and high extremes of body sizes, pregnant women, and vegetarians.)  Routine Coag:  19-Sep-15 23:20   Prothrombin 12.6  INR 1.0 (INR reference interval applies to patients on anticoagulant therapy. A single INR therapeutic range for  coumarins is not optimal for all indications; however, the suggested range for most indications is 2.0 - 3.0. Exceptions to the INR Reference Range may include: Prosthetic heart valves, acute myocardial infarction, prevention of myocardial infarction, and combinations of aspirin and anticoagulant. The need for a higher or lower target INR must be assessed individually. Reference: The Pharmacology and Management of the Vitamin K  antagonists: the seventh ACCP Conference on Antithrombotic and Thrombolytic Therapy. ZTIWP.8099 Sept:126 (3suppl): [id number]. A HCT value >55% may artifactually increase the PT.  In one study,  the increase was an average of 25%. Reference:  "Effect on Routine and Special Coagulation Testing Values of Citrate Anticoagulant Adjustment in Patients with High HCT Values." American Journal of Clinical Pathology 2006;126:400-405.)  Activated PTT (APTT)  23.1 (A HCT value >55% may artifactually increase the APTT. In one study, the increase was an average of 19%. Reference: "Effect on Routine and Special Coagulation Testing Values of Citrate Anticoagulant Adjustment in Patients with High HCT Values." American Journal of Clinical Pathology 2006;126:400-405.)  Routine Hem:  19-Sep-15 23:20   WBC (CBC)  15.1  RBC (CBC) 4.82  Hemoglobin (CBC) 14.6  Hematocrit (CBC) 42.8  Platelet Count (CBC) 180  MCV 89  MCH 30.2  MCHC 34.1  RDW 13.3  Neutrophil % 77.4  Lymphocyte % 14.7  Monocyte % 5.7  Eosinophil % 1.4  Basophil % 0.8  Neutrophil #  11.7  Lymphocyte # 2.2  Monocyte # 0.9  Eosinophil # 0.2  Basophil # 0.1 (Result(s) reported on 20 May 2014 at 11:45PM.)  21-Sep-15 09:28   WBC (CBC)  12.3  RBC (CBC) 4.33  Hemoglobin (CBC) 12.6  Hematocrit (CBC) 38.9  Platelet Count (CBC)  138  MCV 90  MCH 29.1  MCHC 32.4  RDW 13.2  Neutrophil % 75.7  Lymphocyte % 15.2  Monocyte % 6.4  Eosinophil % 1.8  Basophil % 0.9  Neutrophil #  9.3  Lymphocyte # 1.9  Monocyte  # 0.8  Eosinophil # 0.2  Basophil # 0.1 (Result(s) reported on 22 May 2014 at 09:42AM.)  22-Sep-15 05:40   WBC (CBC)  12.8  RBC (CBC) 4.11  Hemoglobin (CBC) 12.5  Hematocrit (CBC) 37.0  Platelet Count (CBC)  121  MCV 90  MCH 30.6  MCHC 33.9  RDW 13.5  Neutrophil % 73.1  Lymphocyte % 16.8  Monocyte % 7.8  Eosinophil % 1.7  Basophil % 0.6  Neutrophil #  9.3  Lymphocyte # 2.1  Monocyte #  1.0  Eosinophil # 0.2  Basophil # 0.1 (Result(s) reported on 23 May 2014 at 07:07AM.)   Pertinent Past History:  Pertinent Past History Hypertension, spinal stenosis, hiatal hernia and gastroesophageal reflux disease.   Hospital Course:  Hospital Course a 59 year old female with peripheral artery disease and acute kidney injury.  1. Peripheral artery disease.     Heparin IV given.    after renal func improved- angiogram and embolectomy done by Dr. Lucky Cowboy on 05/22/14.   Pulses regained. advised atleast 6 mth anticoagulation. start eliquis.   I advised to have cardiac work ups as was on  progesterone and have emboli in legs- she followed with Dr. Rockey Situ in office in past.   Agreed to make appoinement on her own with him.  2. Acute kidney injury.  likely secondary to volume depletion.  on a steroid taper for her spinal stenosis. Once she completed her taper, she had a lot of gastrointestinal symptoms and was vomiting a lot over the course of a few days. Her acute kidney injury is likely secondary to dehydration.  aggressively hydrate the patient and avoid any nephrotoxic drugs, including the patient's home dose of meloxicam, now renal function improved.  3. Hypertension. continue atenolol and felodipine, but will hold  the losartan at this time.  4. Gastroesophageal reflux disease. continue the patient's home regimen of pantoprazole.  5. Deep vein thrombosis prophylaxis.       already on a treatment dose of heparin. start eliquis- d/c today.   Condition on Discharge Stable   Code Status:  Code  Status Full Code   DISCHARGE INSTRUCTIONS HOME MEDS:  Medication Reconciliation: Patient's Home Medications at Discharge:     Medication Instructions  felodipine 10 mg oral tablet, extended release  1 tab(s) orally once a day    atenolol 100 mg oral tablet  1 tab(s) orally once a day    multivitamin  1 daily   hydrochlorothiazide-losartan 25 mg-100 mg oral tablet  1 tab(s) orally once a day   atorvastatin 40 mg oral tablet  1 tab(s) orally once a day (at bedtime)   pantoprazole 40 mg oral delayed release tablet  1 tab(s) orally once a day   meloxicam 15 mg oral tablet  1 tab(s) orally once a day   nitrostat 0.4 mg sublingual tablet  1 tab(s) sublingual every 5 minutes, As Needed   hyoscyamine 0.125 mg oral tablet, disintegrating  1 tab(s) orally 4 times a day, As Needed   lotrisone 0.05%-1% topical cream  Apply topically to affected area 2 times a day   robaxin 500 mg oral tablet  1 tab(s) orally every 8 hours, As Needed - for Pain   apixaban 5 mg oral tablet  2 tab(s) orally 2 times a day x 7 days   apixaban 5 mg oral tablet  1 tab(s) orally 2 times a day- start 05/30/14    STOP TAKING THE FOLLOWING MEDICATION(S):    estrogen-methytestos: 1 daily  Physician's Instructions:  Diet Low Sodium   Activity Limitations None   Return to Work Not Applicable   Time frame for Follow Up Appointment 1-2 weeks  Dr. Rockey Situ   Time frame for Follow Up Appointment 2-4 weeks  Dr. Lucky Cowboy   Time frame for Follow Up Appointment 1-2 weeks  PMD.   Other Comments Follow with cardiology clinic to set up cardiac work ups for clots in legs. ( Pt will make appointment) Follow with Dr. Lucky Cowboy in office in 3-4 weeks.   Electronic Signatures: Vaughan Basta (MD)  (Signed 25-Sep-15 13:11)  Authored: ADMISSION DATE AND DIAGNOSIS, CHIEF COMPLAINT/HPI, Allergies, PERTINENT LABS, PERTINENT PAST HISTORY, HOSPITAL COURSE, DISCHARGE INSTRUCTIONS HOME MEDS, PATIENT INSTRUCTIONS   Last Updated: 25-Sep-15  13:11 by Vaughan Basta (MD)

## 2014-12-23 NOTE — H&P (Signed)
PATIENT NAME:  Kristin James, NORED MR#:  [id number] DATE OF BIRTH:  1956-07-28  DATE OF ADMISSION:  05/21/2014  REFERRING PHYSICIAN: Dr. Dineen Kid FAMILY PHYSICIAN: [name], MD  ADMISSION DIAGNOSIS: Peripheral artery disease.   HISTORY OF PRESENT ILLNESS: This is a 59 year old Caucasian female who presents to the Emergency Department with a cold, painful left foot. The patient states that it began approximately 4 to 5 hours prior to arrival. She thought that the pain in her foot that was radiating up her leg was from her back. She has a history of spinal stenosis and thus thought the symptoms may be paresthesias arising from that condition. She was able to eat dinner, but the pain became too much to bear. She came to the Emergency Department where the Emergency Department staff and nursing verified that there was no dorsalis pedis pulse and that her foot, indeed, was pale and cold to touch. Since arrival in the Emergency Department, the patient's foot has warmed some and she states that the pain has decreased significantly. She still feels subjective coldness the foot. Dr. Lucky Cowboy from vascular surgery was consulted, who recommended the patient be started on heparin drip and admitted for possible surgical consultation which prompted the Emergency Department to call for admission.   REVIEW OF SYSTEMS:  CONSTITUTIONAL: The patient denies fever or weakness.  EYES: Denies blurred vision or inflammation.  EARS, NOSE AND THROAT: Denies tinnitus or sore throat.  RESPIRATORY: Denies cough or wheezing.  CARDIOVASCULAR: Denies chest pain, palpitations.  GASTROINTESTINAL: The patient admits to some nausea and vomiting last week as well as some diarrhea that has only recently resolved. She denies any abdominal pain.  GENITOURINARY: Denies dysuria, increased frequency or hesitancy.  ENDOCRINE: The patient denies polyuria, polydipsia.  HEMATOLOGIC AND LYMPHATIC: The patient denies easy bruising or bleeding.   INTEGUMENTARY: The patient denies rashes or lesions.  MUSCULOSKELETAL: The patient admits to chronic back pain as well as joint pain.  NEUROLOGIC: The patient admits to paresthesias in her feet as well as some shooting pains down her legs. She denies dysarthria.  PSYCHIATRIC: The patient denies depression or suicidal ideation.   PAST MEDICAL HISTORY: Hypertension, spinal stenosis, hiatal hernia and gastroesophageal reflux disease.   SURGICAL HISTORY: Cholecystectomy, nephrolithectomy or kidney stone removal and left lateral meniscus repair.   FAMILY HISTORY: Hypertension and strokes in both parents.   SOCIAL HISTORY: The patient is a social drinker. She does not do drugs or smoke.   MEDICATIONS:  1. Losartan/hydrochlorothiazide 100/25 mg 1 tablet p.o. daily.  2. Atenolol 100 mg 1 tablet p.o. daily.  3. Atorvastatin 40 mg 1 tablet p.o. daily.  4. Estrogen methyltestos 1 tablet daily.  5. Felodipine 10 mg extended release 1 tablet p.o. daily.  6. Hyoscyamine 0.125 mg 1 disintegrating tablet 4 times a day as needed for stomach spasms.  7. Meloxicam 15 mg 1 tablet p.o. daily.  8. Multivitamin 1 tablet p.o. daily.  9. Nitrostat 0.4 mg sublingually every 5 minutes as needed for chest pain.  10. Pantoprazole 40 mg 1 tablet p.o. daily.  11. Progesterone 100 mg 1 capsule p.o. daily at bedtime.   ALLERGIES: No known drug allergies.   PERTINENT LABORATORY RESULTS AND RADIOGRAPHIC FINDINGS: Serum glucose is 136, BUN 43, creatinine 1.99. Sodium 143, potassium 3.2, chloride is 108. White blood cell count is 15.1. INR is 1. There is no imaging.   PHYSICAL EXAMINATION: VITAL SIGNS: Temperature is 98, pulse 85, respirations 18, blood pressure 162/77, pulse oximetry 97% on room  air.  GENERAL: The patient is alert and oriented x3 in no apparent distress.  HEENT: Normocephalic, atraumatic. Pupils equal, round, and reactive to light and accommodation. Extraocular movements are intact. Mucous membranes  are moist.  NECK: Trachea is midline. No adenopathy.  CHEST: Symmetric and atraumatic.  CARDIOVASCULAR: Regular rate and rhythm. Normal S1, S2. No rubs, clicks, or murmurs appreciated. The patient has 2+ radial, as well as popliteal pulses, 2+ DP pulse on the right with a  nonpalpable DP pulse on the left. There is a thready posterior tibial pulse on the left and a solid 1+ posterior tibial pulse on the right. There are no carotid bruits bilaterally.  LUNGS: Clear to auscultation bilaterally. Normal effort and excursion.  ABDOMEN: Positive bowel sounds. Soft, nontender, nondistended. No hepatosplenomegaly.  GENITOURINARY: Deferred.  MUSCULOSKELETAL: The patient moves all 4 extremities equally and there is 5 out of 5 strength in upper and lower extremities bilaterally.  SKIN: No rashes or lesions.  EXTREMITIES: No clubbing, cyanosis, or edema. The left foot is clearly pale and slightly colder to touch than the right foot.  NEUROLOGIC: Cranial nerves II through XII are grossly intact.  PSYCHIATRIC: Mood is normal. Affect is congruent.   ASSESSMENT AND PLAN: This is a 59 year old female with peripheral artery disease and acute kidney injury.  1. Peripheral artery disease. Pulses described as above. Dr. Lucky Cowboy has already been contacted and will see the patient in the morning. I have added a vascular consult to the patient's orders and we have started heparin drip. We have not ordered any angiography at this time due to the patient's acute kidney injury.  2. Acute kidney injury. This is likely secondary to volume depletion. The patient had been on a steroid taper for her spinal stenosis. Once she completed her taper, she had a lot of gastrointestinal symptoms and was vomiting a lot over the course of a few days. Her acute kidney injury is likely secondary to dehydration. Will aggressively hydrate the patient and avoid any nephrotoxic drugs, including the patient's home dose of meloxicam, which I have held at  this time.  3. Hypertension. We will continue atenolol and felodipine, but will hold  the losartan at this time.  4. Gastroesophageal reflux disease. Will continue the patient's home regimen of pantoprazole.  5. Deep vein thrombosis prophylaxis. Apply TED hose or sequential compression devices while the patient is hospitalized. She is already on a treatment dose of heparin.  6. Gastrointestinal prophylaxis. Will continue the patient's home proton pump inhibitor.   CODE STATUS: The patient is a full code.   TIME SPENT ON ADMISSION ORDERS AND PATIENT CARE: Approximately 35 minutes. The patient:    ____________________________ Norva Riffle. Marcille Blanco, MD msd:lm D: 05/21/2014 02:23:05 ET T: 05/21/2014 05:08:59 ET JOB#: [id number]  cc: Norva Riffle. Marcille Blanco, MD, <Dictator> Norva Riffle Jered Heiny MD ELECTRONICALLY SIGNED 05/22/2014 0:05

## 2014-12-23 NOTE — Op Note (Signed)
PATIENT NAME:  Kristin James, HOPKINSON MR#:  [id number] DATE OF BIRTH:  1956/07/30  DATE OF PROCEDURE:  05/22/2014  PREOPERATIVE DIAGNOSES:  1.  Acute lower extremity ischemia with rest pain left lower extremity.  2.  Spinal stenosis.  3.  Acute renal insufficiency, improved with hydration.   POSTOPERATIVE DIAGNOSES:   1.  Acute lower extremity ischemia with rest pain left lower extremity.  2.  Spinal stenosis.  3.  Acute renal insufficiency, improved with hydration.   PROCEDURES:   1.  Ultrasound guidance for vascular access right femoral artery.  2.  Catheter placement to left posterior tibial, anterior tibial, and peroneal arteries from right femoral approach.  3.  Aortogram and selective left lower extremity angiogram.  4.  Catheter-directed thrombolysis with 4 mg of tPA to the left popliteal artery, tibioperoneal trunk, peroneal arteries, and anterior tibial artery.  5.  Mechanical rheolytic thrombectomy to left popliteal artery.  6.  Mechanical rheolytic thrombectomy to left anterior tibial artery.  7.  Mechanical rheolytic thrombectomy to left tibioperoneal trunk and peroneal artery.  8.  Mechanical rheolytic thrombectomy to left posterior tibial artery.  9.  Percutaneous transluminal angioplasty of left anterior tibial artery with 3 mm diameter angioplasty balloon.  10.  Percutaneous transluminal angioplasty of tibioperoneal trunk and peroneal artery with 3 mm diameter angioplasty balloon.  11.  Percutaneous transluminal angioplasty of left posterior tibial artery with 3 mm diameter angioplasty balloon.  12.  StarClose closure device right femoral artery.    SURGEON: Leotis Pain, MD.   ANESTHESIA: Local with moderate conscious sedation.   ESTIMATED BLOOD LOSS: 25 mL.    FLUOROSCOPY TIME: 6  minutes.    CONTRAST:  52 mL.   INDICATION FOR PROCEDURE: This is a 59 year old female who was admitted yesterday with acute pain and numbness to the left lower extremity and no palpable pulse  distally. She had previous history of lower extremity pain with activity that was difficult to differentiate but had been previously attributed to spinal stenosis, it was unclear if she had chronic PAD with acute occlusion or acute embolic phenomenon to the left lower extremity when she was originally admitted. Her symptoms did improve with anticoagulation. She still had some numbness and tingling in her foot, and some pain, but her lack of strength and sensation were resolved with anticoagulation, and her pain was much better. At the time of admission she was unable to be taken to the angiogram suite for acute renal insufficiency issues, her creatinine was 1.99, that has reduced to 1.0 on today's laboratories.  She had no previous history of chronic kidney disease to her knowledge.  I felt angiography was appropriate because it was unclear whether this was acute on chronic disease or truly acute disease and the level of occlusion was felt to likely be below the groin crease as she has a palpable femoral pulse with no palpable pedal pulses. Now that her creatinine has returned to normal she is brought to the angiography suite for further evaluation. Risks and benefits were discussed. Informed consent was obtained.   DESCRIPTION OF PROCEDURE: The patient was brought to the vascular suite. Groins were shaved and prepped and a sterile surgical field was created. The right femoral artery was visualized with ultrasound and found to be widely patent. It was then accessed under direct ultrasound guidance without difficulty with a Seldinger needle. A J-wire and 5 French sheath were then placed and a permanent image was recorded. Pigtail catheter was placed in the aorta at  the L1 level and AP aortogram was performed. This showed normal renal arteries bilaterally, no flow-limiting stenosis or significant disease was seen within the aorta or iliac segments, there was moderate iliac tortuosity. I then used the pigtail  catheter to cross the aortic bifurcation and selective left lower extremity angiogram was performed after the catheter was parked in the left common femoral artery. This showed no significant disease within the common femoral artery, profunda femoris artery, or superficial femoral artery, although the flow was somewhat sluggish through the superficial femoral artery. When we got down the popliteal artery there was an embolus lodged at the level of the knee and occluding all 3 tibial vessels, there was some reconstitution through collaterals in the distal tibial vessels.  At this point the patient was systemically heparinized. A 6 French Ansell sheath was placed over a Terumo Advantage wire and with the help of a Kumpe catheter and the Advantage wire I was able to navigate through the occlusion. I initially got wire and catheter down into the posterior tibial artery and then into the peroneal artery. I repositioned and got to the peroneal artery to start our thrombolysis, 4 mg of tPA were delivered through the AngioJet on the catheter in the popliteal artery, tibioperoneal trunk, and peroneal arteries, and this was allowed to dwell for 15 minutes. Mechanical rheolytic thrombectomy was then performed in the popliteal artery, tibioperoneal trunk, and peroneal arteries for about 119 mL of effluent returned. At this point imaging was performed, which showed that the popliteal artery had restored patency, there was a chunk of thrombus in the tibioperoneal trunk with occlusion of both the peroneal artery and posterior tibial arteries. The anterior tibial artery could now be seen, but there was thrombus within the anterior tibial artery causing flow limitation. I balloon angioplastied the tibioperoneal trunk and the peroneal artery over the proximal 8-10 cm of the peroneal artery to improve flow, then turned my attention to cannulating the anterior tibial artery with a Kumpe catheter and the Advantage wire. I was able to  cannulate the anterior tibial artery and used the AngioJet in this location for thrombolysis and then mechanical rheolytic thrombectomy. This was performed for about 66 mL of effluent returned with the AngioJet thrombectomy device, with some thrombus still residual proximally, so I elected to treat the anterior tibial artery separately with an angioplasty balloon as well. A 3 mm diameter angioplasty balloon was inflated in the proximal anterior tibial artery with resultant restoration of flow. There was some thrombus in the vessel distally, but flow had been restored and improved. At this point imaging showed improved flow, but there still thrombus down the posterior tibial artery and on our initial imaging this appeared to be the best vessel distally that reconstituted, so I elected to treat this as well. I was able to use the Advantage wire and the Kumpe catheter to cannulate the tibioperoneal trunk and then go down the posterior tibial artery. Mechanical rheolytic thrombectomy was performed in the posterior tibial artery for another 57 mL of effluent returned and there was still thrombus in the tibioperoneal trunk and proximal posterior tibial artery after this. I treated this with an angioplasty balloon as well, separate inflation was performed in the posterior tibial artery with a 3 mm diameter angioplasty balloon with resultant markedly improved flow. The posterior tibial artery was now the dominant runoff into the foot, there was still some thrombus in the small vessels distally, but this will be treated largely with anticoagulation. At this  point we had restored flow in the popliteal artery and all 3 tibial vessels, and I felt we had revascularized the patient was far as we could procedurally.  The sheath was removed. StarClose closure device was deployed in the usual fashion with excellent hemostatic result. The patient was taken to the recovery room in stable condition having tolerated the procedure well.     ____________________________ Algernon Huxley, MD jsd:bu D: 05/22/2014 13:54:49 ET T: 05/22/2014 14:27:37 ET JOB#: [id number]  cc: Algernon Huxley, MD, <Dictator> Algernon Huxley MD ELECTRONICALLY SIGNED 06/13/2014 10:24

## 2015-01-30 ENCOUNTER — Encounter: Payer: Self-pay | Admitting: Cardiovascular Disease

## 2015-01-30 ENCOUNTER — Ambulatory Visit (INDEPENDENT_AMBULATORY_CARE_PROVIDER_SITE_OTHER): Payer: BLUE CROSS/BLUE SHIELD | Admitting: Cardiovascular Disease

## 2015-01-30 VITALS — BP 190/90 | HR 58 | Ht 60.0 in | Wt 214.5 lb

## 2015-01-30 DIAGNOSIS — E7800 Pure hypercholesterolemia, unspecified: Secondary | ICD-10-CM

## 2015-01-30 DIAGNOSIS — Z9889 Other specified postprocedural states: Secondary | ICD-10-CM

## 2015-01-30 DIAGNOSIS — I10 Essential (primary) hypertension: Secondary | ICD-10-CM

## 2015-01-30 DIAGNOSIS — I743 Embolism and thrombosis of arteries of the lower extremities: Secondary | ICD-10-CM | POA: Diagnosis not present

## 2015-01-30 DIAGNOSIS — I4891 Unspecified atrial fibrillation: Secondary | ICD-10-CM

## 2015-01-30 NOTE — Assessment & Plan Note (Signed)
Encouraged her to stay on her Lipitor 

## 2015-01-30 NOTE — Assessment & Plan Note (Signed)
Maintaining normal sinus rhythm. Recommended she stay on anticoagulation.  continue her current medications

## 2015-01-30 NOTE — Patient Instructions (Signed)
No medication changes were made. Blood pressure is elevated.   Please monitor your blood pressure at home. If it runs high, Double the losartan HCTZ CAll the office in the next day or so with numbers  Please call us if you have new issues that need to be addressed before your next appt.  Your physician wants you to follow-up in: 6 months.  You will receive a reminder letter in the mail two months in advance. If you don't receive a letter, please call our office to schedule the follow-up appointment.

## 2015-01-30 NOTE — Assessment & Plan Note (Signed)
No further embolic issues. Recommend she stay on anticoagulation

## 2015-01-30 NOTE — Assessment & Plan Note (Signed)
Interesting that her blood pressure is very elevated on today's visit, even on recheck She does report having anxiety when she sees doctors. Recommended she closely monitor her blood pressure at home. She does have a new blood pressure cuff. She will call us today and tomorrow with her numbers. If they continue to run high, recommended she double her losartan HCTZ We have recommended goal systolic pressure less than 150

## 2015-01-30 NOTE — Assessment & Plan Note (Signed)
Currently recovering from back surgery, still participating in physical therapy

## 2015-01-30 NOTE — Progress Notes (Signed)
Patient ID: Kristin James, female    DOB: 08/30/1956, 59 y.o.   MRN: [id number]  HPI Comments: Ms. Kristin James is a pleasant 59 year old woman with history of chronic back pain, spinal stenosis, obesity,  admission to the hospital in September 2015 for acute left lower extremity arterial ischemia secondary to thrombus. She presents for routine followup of her atrial fibrillation  In follow-up today, she denies any tachycardia concerning for arrhythmia She had back surgery March 2016, lumbar region She has been doing PT as an outpatient She reports having blood pressure at home that has been well-controlled. Has not checked it recently but typically runs 120 over 77s. Prior office visits was 846 range systolic  Otherwise she is sleeping well, active with no complaints. No shortness of breath or chest pain  EKG on today's visit shows normal sinus rhythm with rate 58 bpm, no significant ST or T-wave changes  Other past medical history Prior office visit, EKG documented atrial fibrillation. Started on amiodarone, diltiazem, beta blocker and she converted to normal sinus rhythm. Atenolol dose decreased secondary to bradycardia  admitted to the hospital on May 21 2014 with acute pain in her left leg. She had no distal pulses. Her creatinine was 1.99 which improved to 1.0 with fluids. She underwent angiogram of the left lower extremity with thrombolysis with TPA of the left pill, tibial peroneal trunk, peroneal arteries and distally, thrombectomy of the vessel with restoration of flow. she does not have any sleep apnea, does have occasional snoring  Stress test was done in the hospital for chest pain. This showed no ischemia. Ejection fraction 67% EKG in the hospital showed normal sinus rhythm with rate 74 beats per minute. EKG dated 05/21/2014   No Known Allergies  Current Outpatient Prescriptions on File Prior to Visit  Medication Sig Dispense Refill  . amiodarone (PACERONE) 200 MG tablet  Take 1 tablet (200 mg total) by mouth daily. 90 tablet 3  . apixaban (ELIQUIS) 5 MG TABS tablet Take 1 tablet (5 mg total) by mouth 2 (two) times daily. 180 tablet 3  . atenolol (TENORMIN) 25 MG tablet Take 1 tablet (25 mg total) by mouth daily. 90 tablet 3  . atorvastatin (LIPITOR) 40 MG tablet Take 1 tablet (40 mg total) by mouth daily. 90 tablet 3  . diltiazem (CARDIZEM CD) 120 MG 24 hr capsule Take 1 capsule (120 mg total) by mouth daily. 90 capsule 3  . losartan-hydrochlorothiazide (HYZAAR) 50-12.5 MG per tablet Take 1 tablet by mouth daily. 90 tablet 3  . nitroGLYCERIN (NITROSTAT) 0.4 MG SL tablet Place 1 tablet (0.4 mg total) under the tongue every 5 (five) minutes as needed for chest pain. 25 tablet 3  . pantoprazole (PROTONIX) 40 MG tablet Take 1 tablet (40 mg total) by mouth daily. 30 tablet 5   No current facility-administered medications on file prior to visit.    Past Medical History  Diagnosis Date  . Hypertension   . Hypercholesteremia   . Hyperglycemia   . Osteoarthritis   . Proteinuria   . Hiatal hernia   . Spinal stenosis at L4-L5 level   . Peripheral arterial disease     embolic disease   . Embolus to lower extremity     Past Surgical History  Procedure Laterality Date  . Cholecystectomy    . Knee arthroscopy    . Lower extremity angiogram    . Aortogram    . Back surgery  11/13/2014    Social History  reports  that she has never smoked. She has never used smokeless tobacco. She reports that she drinks alcohol. She reports that she does not use illicit drugs.  Family History family history includes Hypertension in her father and mother; Stroke in her father. There is no history of Breast cancer or Colon cancer.  Review of Systems  Constitutional: Negative.   Respiratory: Negative.   Cardiovascular: Negative.   Gastrointestinal: Negative.   Musculoskeletal: Positive for back pain.  Skin: Negative.   Neurological: Negative.   Hematological: Negative.    Psychiatric/Behavioral: Negative.   All other systems reviewed and are negative.   BP 190/90 mmHg  Pulse 58  Ht 5' (1.524 m)  Wt 214 lb 8 oz (97.297 kg)  BMI 41.89 kg/m2  Physical Exam  Constitutional: She is oriented to person, place, and time. She appears well-developed and well-nourished.  HENT:  Head: Normocephalic.  Nose: Nose normal.  Mouth/Throat: Oropharynx is clear and moist.  Eyes: Conjunctivae are normal. Pupils are equal, round, and reactive to light.  Neck: Normal range of motion. Neck supple. No JVD present.  Cardiovascular: Normal rate, regular rhythm, S1 normal, S2 normal, normal heart sounds and intact distal pulses.  Exam reveals no gallop and no friction rub.   No murmur heard. Pulmonary/Chest: Effort normal and breath sounds normal. No respiratory distress. She has no wheezes. She has no rales. She exhibits no tenderness.  Abdominal: Soft. Bowel sounds are normal. She exhibits no distension. There is no tenderness.  Musculoskeletal: Normal range of motion. She exhibits no edema or tenderness.  Lymphadenopathy:    She has no cervical adenopathy.  Neurological: She is alert and oriented to person, place, and time. Coordination normal.  Skin: Skin is warm and dry. No rash noted. No erythema.  Psychiatric: She has a normal mood and affect. Her behavior is normal. Judgment and thought content normal.    Assessment and Plan  Nursing note and vitals reviewed.

## 2015-02-01 ENCOUNTER — Telehealth: Payer: Self-pay | Admitting: *Deleted

## 2015-02-01 NOTE — Telephone Encounter (Signed)
Pt c/o BP issue: STAT if pt c/o blurred vision, one-sided weakness or slurred speech  1. What are your last 5 BP readings?  01/30/15: 6 pm (before changes to med) 163/84 9 pm (after taking more of Losartan ) 165/81 01/31/15 8 am 179/93 12 pm 144/83 5:30 pm 153/88 7:30 pm 161/95 10:30 157/83 02/01/15  7 am 176/92 2. Are you having any other symptoms (ex. Dizziness, headache, blurred vision, passed out)? no  3. What is your BP issue? Last time patient came in recently and was told to give [place] BP readings with the new changes to medication. She was told to take a total of 1 pill of losartan instead of just half a pill.

## 2015-02-01 NOTE — Telephone Encounter (Signed)
She was take one whole pill of the losartan HCTZ 50/12.5 daily on visit 2 days ago If BP elevated, which it is, recommneded she take 2 per day in the AM Would watch BPs through the next few days, call next week with numbers Ok to call in new script for 100/25 mg daily

## 2015-02-01 NOTE — Telephone Encounter (Signed)
Spoke w/ pt.  Advised her of Dr. Donivan Scull recommendation.   She verbalizes understanding and will call next week w/ readings.

## 2015-02-01 NOTE — Telephone Encounter (Signed)
Left message for pt to call back  °

## 2015-03-01 ENCOUNTER — Other Ambulatory Visit: Payer: Self-pay | Admitting: *Deleted

## 2015-03-01 MED ORDER — PANTOPRAZOLE SODIUM 40 MG PO TBEC: 40.0000 mg | DELAYED_RELEASE_TABLET | Freq: Every day | ORAL | Status: DC

## 2015-03-20 ENCOUNTER — Encounter: Payer: Self-pay | Admitting: Internal Medicine

## 2015-03-20 ENCOUNTER — Ambulatory Visit (INDEPENDENT_AMBULATORY_CARE_PROVIDER_SITE_OTHER): Payer: BLUE CROSS/BLUE SHIELD | Admitting: Internal Medicine

## 2015-03-20 ENCOUNTER — Other Ambulatory Visit (HOSPITAL_COMMUNITY)
Admission: RE | Admit: 2015-03-20 | Discharge: 2015-03-20 | Disposition: A | Discharge status: Home / Self Care | Payer: BLUE CROSS/BLUE SHIELD | Source: Ambulatory Visit | Attending: Internal Medicine | Admitting: Internal Medicine

## 2015-03-20 VITALS — HR 63 | Temp 98.4°F | Ht 60.0 in | Wt 211.5 lb

## 2015-03-20 DIAGNOSIS — N289 Disorder of kidney and ureter, unspecified: Secondary | ICD-10-CM

## 2015-03-20 DIAGNOSIS — M5440 Lumbago with sciatica, unspecified side: Secondary | ICD-10-CM

## 2015-03-20 DIAGNOSIS — Z1151 Encounter for screening for human papillomavirus (HPV): Secondary | ICD-10-CM | POA: Insufficient documentation

## 2015-03-20 DIAGNOSIS — Z0000 Encounter for general adult medical examination without abnormal findings: Secondary | ICD-10-CM

## 2015-03-20 DIAGNOSIS — R739 Hyperglycemia, unspecified: Secondary | ICD-10-CM

## 2015-03-20 DIAGNOSIS — E7800 Pure hypercholesterolemia, unspecified: Secondary | ICD-10-CM

## 2015-03-20 DIAGNOSIS — Z1239 Encounter for other screening for malignant neoplasm of breast: Secondary | ICD-10-CM

## 2015-03-20 DIAGNOSIS — I4891 Unspecified atrial fibrillation: Secondary | ICD-10-CM | POA: Diagnosis not present

## 2015-03-20 DIAGNOSIS — I10 Essential (primary) hypertension: Secondary | ICD-10-CM

## 2015-03-20 DIAGNOSIS — Z01419 Encounter for gynecological examination (general) (routine) without abnormal findings: Secondary | ICD-10-CM | POA: Insufficient documentation

## 2015-03-20 LAB — CBC WITH DIFFERENTIAL/PLATELET
Basophils Absolute: 0.1 10*3/uL (ref 0.0–0.1)
Basophils Relative: 0.6 % (ref 0.0–3.0)
Eosinophils Absolute: 0.3 10*3/uL (ref 0.0–0.7)
Eosinophils Relative: 3.5 % (ref 0.0–5.0)
HEMATOCRIT: 46.6 % — AB (ref 36.0–46.0)
HEMOGLOBIN: 15.7 g/dL — AB (ref 12.0–15.0)
LYMPHS PCT: 23.4 % (ref 12.0–46.0)
Lymphs Abs: 1.9 10*3/uL (ref 0.7–4.0)
MCHC: 33.6 g/dL (ref 30.0–36.0)
MCV: 89.2 fl (ref 78.0–100.0)
MONO ABS: 0.8 10*3/uL (ref 0.1–1.0)
Monocytes Relative: 9.7 % (ref 3.0–12.0)
NEUTROS PCT: 62.8 % (ref 43.0–77.0)
Neutro Abs: 5 10*3/uL (ref 1.4–7.7)
Platelets: 227 10*3/uL (ref 150.0–400.0)
RBC: 5.23 Mil/uL — AB (ref 3.87–5.11)
RDW: 13.3 % (ref 11.5–15.5)
WBC: 8 10*3/uL (ref 4.0–10.5)

## 2015-03-20 LAB — BASIC METABOLIC PANEL
BUN: 26 mg/dL — ABNORMAL HIGH (ref 6–23)
CHLORIDE: 103 meq/L (ref 96–112)
CO2: 29 mEq/L (ref 19–32)
Calcium: 9.5 mg/dL (ref 8.4–10.5)
Creatinine, Ser: 1.08 mg/dL (ref 0.40–1.20)
GFR: 55.06 mL/min — AB (ref >60.00)
Glucose, Bld: 121 mg/dL — ABNORMAL HIGH (ref 70–99)
Potassium: 3.9 mEq/L (ref 3.5–5.1)
SODIUM: 139 meq/L (ref 135–145)

## 2015-03-20 LAB — HEPATIC FUNCTION PANEL
ALT: 29 U/L (ref 0–35)
AST: 25 U/L (ref 0–37)
Albumin: 4.1 g/dL (ref 3.5–5.2)
Alkaline Phosphatase: 119 U/L — ABNORMAL HIGH (ref 39–117)
Bilirubin, Direct: 0.1 mg/dL (ref 0.0–0.3)
Total Bilirubin: 0.7 mg/dL (ref 0.2–1.2)
Total Protein: 7.3 g/dL (ref 6.0–8.3)

## 2015-03-20 LAB — MICROALBUMIN / CREATININE URINE RATIO
Creatinine,U: 62.6 mg/dL
Microalb Creat Ratio: 14.9 mg/g (ref 0.0–30.0)
Microalb, Ur: 9.3 mg/dL — ABNORMAL HIGH (ref 0.0–1.9)

## 2015-03-20 LAB — HEMOGLOBIN A1C: Hgb A1c MFr Bld: 6.5 % (ref 4.6–6.5)

## 2015-03-20 LAB — LIPID PANEL
CHOL/HDL RATIO: 4
Cholesterol: 174 mg/dL (ref 0–200)
HDL: 45.3 mg/dL (ref >39.00)
LDL Cholesterol: 107 mg/dL — ABNORMAL HIGH (ref 0–99)
NONHDL: 128.7
Triglycerides: 108 mg/dL (ref 0.0–149.0)
VLDL: 21.6 mg/dL (ref 0.0–40.0)

## 2015-03-20 NOTE — Progress Notes (Signed)
Pre visit review using our clinic review tool, if applicable. No additional management support is needed unless otherwise documented below in the visit note. 

## 2015-03-20 NOTE — Progress Notes (Signed)
Patient ID: Kristin James, female   DOB: 1956-08-01, 59 y.o.   MRN: [id number]   Subjective:    Patient ID: Kristin James, female    DOB: 20-Aug-1956, 59 y.o.   MRN: [id number]  HPI  Patient here to follow up on her medical issues as well as for a complete physical exam.  Is s/p back surgery.  Had 6 weeks of physical therapy.  The back pain is better.  Still with some soreness from the surgery.  Seeing Dr Carloyn Manner.  Has f/u planned next month.  He had given her meloxicam.  She has not started taking.  Has had issues with her blood pressure being elevated.  Dr Rockey Situ doubled her losartan/HCTZ.  She is now taking two per day.  Tolerating.  States blood pressure earlier today 170s/90s.  Has not been able to exercise while recovering from her back surgery.  Blood sugars have been doing well - per her report.  Bowels stable.  No increased heart rate or palpitations.     Past Medical History  Diagnosis Date  . Hypertension   . Hypercholesteremia   . Hyperglycemia   . Osteoarthritis   . Proteinuria   . Hiatal hernia   . Spinal stenosis at L4-L5 level   . Peripheral arterial disease     embolic disease   . Embolus to lower extremity     Outpatient Encounter Prescriptions as of 03/20/2015  Medication Sig  . amiodarone (PACERONE) 200 MG tablet Take 1 tablet (200 mg total) by mouth daily.  Marland Kitchen apixaban (ELIQUIS) 5 MG TABS tablet Take 1 tablet (5 mg total) by mouth 2 (two) times daily.  Marland Kitchen atenolol (TENORMIN) 25 MG tablet Take 1 tablet (25 mg total) by mouth daily.  Marland Kitchen atorvastatin (LIPITOR) 40 MG tablet Take 1 tablet (40 mg total) by mouth daily.  Marland Kitchen diltiazem (CARDIZEM CD) 120 MG 24 hr capsule Take 1 capsule (120 mg total) by mouth daily.  Marland Kitchen losartan-hydrochlorothiazide (HYZAAR) 50-12.5 MG per tablet Take 1 tablet by mouth daily. (Patient taking differently: Take 2 tablets by mouth daily. )  . meloxicam (MOBIC) 15 MG tablet Take 15 mg by mouth daily.  . pantoprazole (PROTONIX) 40 MG tablet Take 1 tablet  (40 mg total) by mouth daily.  . [DISCONTINUED] nitroGLYCERIN (NITROSTAT) 0.4 MG SL tablet Place 1 tablet (0.4 mg total) under the tongue every 5 (five) minutes as needed for chest pain. (Patient not taking: Reported on 03/20/2015)   No facility-administered encounter medications on file as of 03/20/2015.    Review of Systems  Constitutional: Negative for appetite change and unexpected weight change.  HENT: Negative for congestion and sinus pressure.   Eyes: Negative for pain and visual disturbance.  Respiratory: Negative for cough, chest tightness and shortness of breath.   Cardiovascular: Negative for chest pain, palpitations and leg swelling.  Gastrointestinal: Negative for nausea, vomiting, abdominal pain and diarrhea.  Genitourinary: Negative for dysuria and difficulty urinating.  Musculoskeletal: Positive for back pain (soreness after her surgery. ). Negative for joint swelling.  Skin: Negative for color change and rash.  Neurological: Negative for dizziness, light-headedness and headaches.  Hematological: Negative for adenopathy. Does not bruise/bleed easily.  Psychiatric/Behavioral: Negative for dysphoric mood and agitation.       Objective:     Blood pressure recheck:  140-142/86  Physical Exam  Constitutional: She is oriented to person, place, and time. She appears well-developed and well-nourished.  HENT:  Nose: Nose normal.  Mouth/Throat: Oropharynx is clear and  moist.  Eyes: Right eye exhibits no discharge. Left eye exhibits no discharge. No scleral icterus.  Neck: Neck supple. No thyromegaly present.  Cardiovascular: Normal rate and regular rhythm.   Pulmonary/Chest: Breath sounds normal. No accessory muscle usage. No tachypnea. No respiratory distress. She has no decreased breath sounds. She has no wheezes. She has no rhonchi. Right breast exhibits no inverted nipple, no mass, no nipple discharge and no tenderness (no axillary adenopathy). Left breast exhibits no  inverted nipple, no mass, no nipple discharge and no tenderness (no axilarry adenopathy).  Abdominal: Soft. Bowel sounds are normal. There is no tenderness.  Genitourinary:  Normal external genitalia.  Vaginal vault without lesions. Cervix identified.  PAP performed.  Could not appreciate any adnexal masses or tenderness.  Rectal exam - heme negative.    Musculoskeletal: She exhibits no edema or tenderness.  Lymphadenopathy:    She has no cervical adenopathy.  Neurological: She is alert and oriented to person, place, and time.  Skin: Skin is warm. No rash noted. No erythema.  Psychiatric: She has a normal mood and affect. Her behavior is normal.    Pulse 63  Temp(Src) 98.4 F (36.9 C) (Oral)  Ht 5' (1.524 m)  Wt 211 lb 8 oz (95.936 kg)  BMI 41.31 kg/m2  SpO2 96% Wt Readings from Last 3 Encounters:  03/20/15 211 lb 8 oz (95.936 kg)  01/30/15 214 lb 8 oz (97.297 kg)  11/08/14 211 lb (95.709 kg)     Lab Results  Component Value Date   WBC 8.0 03/20/2015   HGB 15.7* 03/20/2015   HCT 46.6* 03/20/2015   PLT 227.0 03/20/2015   GLUCOSE 121* 03/20/2015   CHOL 174 03/20/2015   TRIG 108.0 03/20/2015   HDL 45.30 03/20/2015   LDLCALC 107* 03/20/2015   ALT 29 03/20/2015   AST 25 03/20/2015   NA 139 03/20/2015   K 3.9 03/20/2015   CL 103 03/20/2015   CREATININE 1.08 03/20/2015   BUN 26* 03/20/2015   CO2 29 03/20/2015   TSH 0.75 06/13/2014   INR 1.0 05/23/2014   HGBA1C 6.5 03/20/2015   MICROALBUR 9.3* 03/20/2015       Assessment & Plan:   Problem List Items Addressed This Visit    Atrial fibrillation with RVR    Appears to be in SR.  On blood thinner.  Continue f/u with cardiology.       Relevant Orders   Cytology - PAP   Back pain    S/p surgery.  Back pain resolved.  Has soreness from her surgery.  Has been to physical therapy.  Follow.  Keep f/u with Dr Carloyn Manner.       Relevant Medications   meloxicam (MOBIC) 15 MG tablet   Health care maintenance    Physical today  03/20/15.  Mammogram 05/15/14 - Birads I.  Colonoscopy 06/2009.  Recommended f/u in five years.  On eliquis.  Follow.       Hypercholesterolemia    Low cholesterol diet and exercise.  On lipitor.  Follow lipid panel and liver function tests.        Relevant Orders   Lipid panel (Completed)   Hepatic function panel (Completed)   Cytology - PAP   Hyperglycemia    Low carb diet and exercise.  Follow met b and a1c.       Relevant Orders   Hemoglobin A1c (Completed)   Microalbumin / creatinine urine ratio (Completed)   Cytology - PAP   Hypertension - Primary  Blood pressure as outlined.  Recheck as outlined.  Have her spot check her pressure.  Have her spot check her pressure.  Continue same medications for now.  Follow.  Check metabolic panel.        Relevant Orders   CBC with Differential/Platelet (Completed)   Cytology - PAP   Morbid obesity    Diet and exercise.       Renal insufficiency    Recheck kidney function today.  Recommend avoiding meloxicam.        Relevant Orders   Basic metabolic panel (Completed)   Cytology - PAP    Other Visit Diagnoses    Breast cancer screening        Relevant Orders    MM DIGITAL SCREENING BILATERAL    Cytology - PAP      I spent 25 minutes with the patient and more than 50% of the time was spent in consultation regarding the above.     [name], MD

## 2015-03-21 ENCOUNTER — Encounter: Payer: Self-pay | Admitting: Internal Medicine

## 2015-03-21 NOTE — Assessment & Plan Note (Signed)
Diet and exercise.   

## 2015-03-21 NOTE — Assessment & Plan Note (Signed)
Low cholesterol diet and exercise.  On lipitor.  Follow lipid panel and liver function tests.   

## 2015-03-21 NOTE — Assessment & Plan Note (Signed)
Low carb diet and exercise.  Follow met b and a1c.  

## 2015-03-21 NOTE — Assessment & Plan Note (Signed)
Blood pressure as outlined.  Recheck as outlined.  Have her spot check her pressure.  Have her spot check her pressure.  Continue same medications for now.  Follow.  Check metabolic panel.

## 2015-03-21 NOTE — Assessment & Plan Note (Signed)
Recheck kidney function today.  Recommend avoiding meloxicam.

## 2015-03-21 NOTE — Assessment & Plan Note (Signed)
Physical today 03/20/15.  Mammogram 05/15/14 - Birads I.  Colonoscopy 06/2009.  Recommended f/u in five years.  On eliquis.  Follow.

## 2015-03-21 NOTE — Assessment & Plan Note (Signed)
Appears to be in SR.  On blood thinner.  Continue f/u with cardiology.

## 2015-03-21 NOTE — Assessment & Plan Note (Signed)
S/p surgery.  Back pain resolved.  Has soreness from her surgery.  Has been to physical therapy.  Follow.  Keep f/u with Dr Carloyn Manner.

## 2015-03-22 ENCOUNTER — Encounter: Payer: Self-pay | Admitting: Internal Medicine

## 2015-03-22 LAB — CYTOLOGY - PAP

## 2015-03-23 NOTE — Telephone Encounter (Signed)
Unread mychart message mailed to patient 

## 2015-05-17 ENCOUNTER — Ambulatory Visit
Admission: RE | Admit: 2015-05-17 | Discharge: 2015-05-17 | Disposition: A | Discharge status: Home / Self Care | Payer: BLUE CROSS/BLUE SHIELD | Source: Ambulatory Visit | Attending: Internal Medicine | Admitting: Internal Medicine

## 2015-05-17 DIAGNOSIS — Z1239 Encounter for other screening for malignant neoplasm of breast: Secondary | ICD-10-CM

## 2015-05-23 ENCOUNTER — Ambulatory Visit
Admission: RE | Admit: 2015-05-23 | Discharge: 2015-05-23 | Disposition: A | Discharge status: Home / Self Care | Payer: BLUE CROSS/BLUE SHIELD | Source: Ambulatory Visit | Attending: Internal Medicine | Admitting: Internal Medicine

## 2015-05-23 DIAGNOSIS — Z1231 Encounter for screening mammogram for malignant neoplasm of breast: Secondary | ICD-10-CM | POA: Diagnosis present

## 2015-07-24 ENCOUNTER — Encounter: Payer: Self-pay | Admitting: Internal Medicine

## 2015-07-24 ENCOUNTER — Ambulatory Visit (INDEPENDENT_AMBULATORY_CARE_PROVIDER_SITE_OTHER): Payer: BLUE CROSS/BLUE SHIELD | Admitting: Internal Medicine

## 2015-07-24 VITALS — BP 164/100 | HR 81 | Temp 98.0°F | Resp 18 | Ht 60.0 in | Wt 216.0 lb

## 2015-07-24 DIAGNOSIS — D582 Other hemoglobinopathies: Secondary | ICD-10-CM

## 2015-07-24 DIAGNOSIS — I4891 Unspecified atrial fibrillation: Secondary | ICD-10-CM

## 2015-07-24 DIAGNOSIS — Z23 Encounter for immunization: Secondary | ICD-10-CM

## 2015-07-24 DIAGNOSIS — R739 Hyperglycemia, unspecified: Secondary | ICD-10-CM | POA: Diagnosis not present

## 2015-07-24 DIAGNOSIS — I10 Essential (primary) hypertension: Secondary | ICD-10-CM | POA: Diagnosis not present

## 2015-07-24 DIAGNOSIS — I743 Embolism and thrombosis of arteries of the lower extremities: Secondary | ICD-10-CM

## 2015-07-24 DIAGNOSIS — E7800 Pure hypercholesterolemia, unspecified: Secondary | ICD-10-CM

## 2015-07-24 DIAGNOSIS — M4800 Spinal stenosis, site unspecified: Secondary | ICD-10-CM

## 2015-07-24 DIAGNOSIS — I998 Other disorder of circulatory system: Secondary | ICD-10-CM

## 2015-07-24 DIAGNOSIS — N289 Disorder of kidney and ureter, unspecified: Secondary | ICD-10-CM | POA: Diagnosis not present

## 2015-07-24 LAB — CBC WITH DIFFERENTIAL/PLATELET
BASOS PCT: 0.7 % (ref 0.0–3.0)
Basophils Absolute: 0.1 10*3/uL (ref 0.0–0.1)
EOS ABS: 0.1 10*3/uL (ref 0.0–0.7)
Eosinophils Relative: 1.9 % (ref 0.0–5.0)
HCT: 48.3 % — ABNORMAL HIGH (ref 36.0–46.0)
Hemoglobin: 16.3 g/dL — ABNORMAL HIGH (ref 12.0–15.0)
Lymphocytes Relative: 27 % (ref 12.0–46.0)
Lymphs Abs: 2.1 10*3/uL (ref 0.7–4.0)
MCHC: 33.8 g/dL (ref 30.0–36.0)
MCV: 89.3 fl (ref 78.0–100.0)
MONO ABS: 0.7 10*3/uL (ref 0.1–1.0)
Monocytes Relative: 8.8 % (ref 3.0–12.0)
NEUTROS ABS: 4.7 10*3/uL (ref 1.4–7.7)
Neutrophils Relative %: 61.6 % (ref 43.0–77.0)
PLATELETS: 231 10*3/uL (ref 150.0–400.0)
RBC: 5.41 Mil/uL — ABNORMAL HIGH (ref 3.87–5.11)
RDW: 13.5 % (ref 11.5–15.5)
WBC: 7.6 10*3/uL (ref 4.0–10.5)

## 2015-07-24 LAB — BASIC METABOLIC PANEL
BUN: 26 mg/dL — ABNORMAL HIGH (ref 6–23)
CO2: 30 meq/L (ref 19–32)
Calcium: 9.4 mg/dL (ref 8.4–10.5)
Chloride: 103 mEq/L (ref 96–112)
Creatinine, Ser: 1.02 mg/dL (ref 0.40–1.20)
GFR: 58.74 mL/min — ABNORMAL LOW (ref >60.00)
GLUCOSE: 115 mg/dL — AB (ref 70–99)
POTASSIUM: 4 meq/L (ref 3.5–5.1)
SODIUM: 140 meq/L (ref 135–145)

## 2015-07-24 LAB — LIPID PANEL
CHOLESTEROL: 180 mg/dL (ref 0–200)
HDL: 54.7 mg/dL (ref >39.00)
LDL Cholesterol: 109 mg/dL — ABNORMAL HIGH (ref 0–99)
NonHDL: 125.53
Total CHOL/HDL Ratio: 3
Triglycerides: 83 mg/dL (ref 0.0–149.0)
VLDL: 16.6 mg/dL (ref 0.0–40.0)

## 2015-07-24 LAB — HEPATIC FUNCTION PANEL
ALK PHOS: 112 U/L (ref 39–117)
ALT: 38 U/L — AB (ref 0–35)
AST: 25 U/L (ref 0–37)
Albumin: 4.2 g/dL (ref 3.5–5.2)
BILIRUBIN DIRECT: 0.1 mg/dL (ref 0.0–0.3)
TOTAL PROTEIN: 7.5 g/dL (ref 6.0–8.3)
Total Bilirubin: 0.7 mg/dL (ref 0.2–1.2)

## 2015-07-24 LAB — HEMOGLOBIN A1C: Hgb A1c MFr Bld: 6.3 % (ref 4.6–6.5)

## 2015-07-24 LAB — TSH: TSH: 2.32 u[IU]/mL (ref 0.35–4.50)

## 2015-07-24 MED ORDER — MELOXICAM 7.5 MG PO TABS: 7.5000 mg | ORAL_TABLET | Freq: Every day | ORAL | Status: DC | PRN

## 2015-07-24 NOTE — Progress Notes (Signed)
Pre-visit discussion using our clinic review tool. No additional management support is needed unless otherwise documented below in the visit note.  

## 2015-07-24 NOTE — Progress Notes (Signed)
Patient ID: CHAUNICE OBIE, female   DOB: [birth year], 59 y.o.   MRN: [id number]   Subjective:    Patient ID: [name], female    DOB: 1956-08-31, 59 y.o.   MRN: [id number]  HPI  Patient with past history of hypercholesterolemia, hyperglycemia and hypertension.  She comes in today to follow up on these issues.  State she saw Dr Carloyn Manner last week.  She does better on meloxicam.  Can function and move better.  Discussed my concern of taking meloxicam regularly.  Discussed the need to follow her kidney function.  Also discussed the possibility of increased bleeding - on plavix also.  Will need to monitor for GI irritation.  Is walking.  Feels better.  Blood pressure is elevated.  Varies.  Averaging 140s/80s.  No chest pain or tightness.  No increased heart rate or palpitations.  No abdominal pain or cramping.  Bowels stable.     Past Medical History  Diagnosis Date  . Hypertension   . Hypercholesteremia   . Hyperglycemia   . Osteoarthritis   . Proteinuria   . Hiatal hernia   . Spinal stenosis at L4-L5 level   . Peripheral arterial disease (HCC)     embolic disease   . Embolus to lower extremity [facility])    Past Surgical History  Procedure Laterality Date  . Cholecystectomy    . Knee arthroscopy    . Lower extremity angiogram    . Aortogram    . Back surgery  11/13/2014   Family History  Problem Relation Age of Onset  . Hypertension Mother   . Stroke Father     3 strokes  . Hypertension Father   . Colon cancer Neg Hx   . Breast cancer Paternal Grandmother    Social History   Social History  . Marital Status: Married    Spouse Name: N/A  . Number of Children: 2  . Years of Education: N/A   Social History Main Topics  . Smoking status: Never Smoker   . Smokeless tobacco: Never Used  . Alcohol Use: 0.0 oz/week    0 Standard drinks or equivalent per week  . Drug Use: No  . Sexual Activity: Not Asked   Other Topics Concern  . None   Social History Narrative     Outpatient Encounter Prescriptions as of 07/24/2015  Medication Sig  . amiodarone (PACERONE) 200 MG tablet Take 1 tablet (200 mg total) by mouth daily.  Marland Kitchen apixaban (ELIQUIS) 5 MG TABS tablet Take 1 tablet (5 mg total) by mouth 2 (two) times daily.  Marland Kitchen atenolol (TENORMIN) 25 MG tablet Take 1 tablet (25 mg total) by mouth daily.  Marland Kitchen atorvastatin (LIPITOR) 40 MG tablet Take 1 tablet (40 mg total) by mouth daily.  Marland Kitchen diltiazem (CARDIZEM CD) 120 MG 24 hr capsule Take 1 capsule (120 mg total) by mouth daily.  Marland Kitchen losartan-hydrochlorothiazide (HYZAAR) 50-12.5 MG per tablet Take 1 tablet by mouth daily. (Patient taking differently: Take 2 tablets by mouth daily. )  . pantoprazole (PROTONIX) 40 MG tablet Take 1 tablet (40 mg total) by mouth daily.  . [DISCONTINUED] meloxicam (MOBIC) 15 MG tablet Take 15 mg by mouth daily.  . meloxicam (MOBIC) 7.5 MG tablet Take 1 tablet (7.5 mg total) by mouth daily as needed for pain.   No facility-administered encounter medications on file as of 07/24/2015.    Review of Systems  Constitutional: Negative for appetite change and unexpected weight change.  HENT: Negative for congestion and  sinus pressure.   Eyes: Negative for pain and discharge.  Respiratory: Negative for cough, chest tightness and shortness of breath.   Cardiovascular: Negative for chest pain, palpitations and leg swelling.  Gastrointestinal: Negative for nausea, vomiting, abdominal pain and diarrhea.  Genitourinary: Negative for dysuria and difficulty urinating.  Musculoskeletal:       Back is doing much better.  Legs better on meloxicam.   Skin: Negative for color change and rash.  Neurological: Negative for dizziness, light-headedness and headaches.  Psychiatric/Behavioral: Negative for dysphoric mood and agitation.       Objective:     blood pressure rechecked prior to leaving:  152-156/78-80  Physical Exam  Constitutional: She appears well-developed and well-nourished. No distress.   HENT:  Nose: Nose normal.  Mouth/Throat: Oropharynx is clear and moist.  Eyes: Conjunctivae are normal. Right eye exhibits no discharge. Left eye exhibits no discharge.  Neck: Neck supple. No thyromegaly present.  Cardiovascular: Normal rate and regular rhythm.   Pulmonary/Chest: Breath sounds normal. No respiratory distress. She has no wheezes.  Abdominal: Soft. Bowel sounds are normal. There is no tenderness.  Musculoskeletal: She exhibits no edema or tenderness.  Lymphadenopathy:    She has no cervical adenopathy.  Skin: No rash noted. No erythema.  Psychiatric: She has a normal mood and affect. Her behavior is normal.    BP 164/100 mmHg  Pulse 81  Temp(Src) 98 F (36.7 C) (Oral)  Resp 18  Ht 5' (1.524 m)  Wt 216 lb (97.977 kg)  BMI 42.18 kg/m2  SpO2 96% Wt Readings from Last 3 Encounters:  07/24/15 216 lb (97.977 kg)  03/20/15 211 lb 8 oz (95.936 kg)  01/30/15 214 lb 8 oz (97.297 kg)     Lab Results  Component Value Date   WBC 7.6 07/24/2015   HGB 16.3* 07/24/2015   HCT 48.3* 07/24/2015   PLT 231.0 07/24/2015   GLUCOSE 115* 07/24/2015   CHOL 180 07/24/2015   TRIG 83.0 07/24/2015   HDL 54.70 07/24/2015   LDLCALC 109* 07/24/2015   ALT 38* 07/24/2015   AST 25 07/24/2015   NA 140 07/24/2015   K 4.0 07/24/2015   CL 103 07/24/2015   CREATININE 1.02 07/24/2015   BUN 26* 07/24/2015   CO2 30 07/24/2015   TSH 2.32 07/24/2015   INR 1.0 05/23/2014   HGBA1C 6.3 07/24/2015   MICROALBUR 9.3* 03/20/2015    Mm Digital Screening Bilateral  05/24/2015  CLINICAL DATA:  Screening. EXAM: DIGITAL SCREENING BILATERAL MAMMOGRAM WITH CAD COMPARISON:  Previous exam(s). ACR Breast Density Category b: There are scattered areas of fibroglandular density. FINDINGS: There are no findings suspicious for malignancy. Images were processed with CAD. IMPRESSION: No mammographic evidence of malignancy. A result letter of this screening mammogram will be mailed directly to the patient.  RECOMMENDATION: Screening mammogram in one year. (Code:SM-B-01Y) BI-RADS CATEGORY  1: Negative. Electronically Signed   By: Fidela Salisbury M.D.   On: 05/24/2015 09:35       Assessment & Plan:   Problem List Items Addressed This Visit    Atrial fibrillation with RVR ([place])    Appears to be in SR today.  On eliquis.  Continues f/u with cardiology.       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  Followed by vascular surgery.       Hypercholesterolemia    Low cholesterol diet and exercise.  Follow lipid panel and liver function tests.  Relevant Orders   Lipid panel (Completed)   Hepatic function panel (Completed)   Hyperglycemia    Low carb diet and exercise.  Follow met b and a1c.        Relevant Orders   TSH (Completed)   Hemoglobin A1c (Completed)   Hypertension - Primary    Blood pressure elevated.  Discussed with her today.  Discussed my concern regarding continuing on meloxicam.  She is hesitant to stop.  Has pain when off.  She did agree to at least decrease the dose.  Decrease to 7.34m q day.  Discussed the possibility of using ultram instead.  Follow.  She wants to hold on changing her blood pressure medication until seen by Dr GRockey Situ  States he is following her blood pressure.  Continue to spot check.  Check metabolic panel.        Relevant Orders   Basic metabolic panel (Completed)   Ischemia of lower extremity    No further ischemic events.  On anticoagulation.   Continue.  Continues f/u with vascular surgery.       Renal insufficiency    Try to avoid nephrotoxic agents.  Renal function is better.  Decrease dose of meloxicam.  She states cannot function as well off.  Follow closely.  Recheck metabolic panel today.        Spinal stenosis    S/p surgery and doing well.  Seeing Dr RCarloyn Manner        Other Visit Diagnoses    Elevated hemoglobin ([place]        Relevant Orders    CBC with Differential/Platelet (Completed)     Encounter for immunization            SEinar Pheasant MD

## 2015-07-25 ENCOUNTER — Other Ambulatory Visit: Payer: Self-pay | Admitting: Internal Medicine

## 2015-07-25 DIAGNOSIS — R7989 Other specified abnormal findings of blood chemistry: Secondary | ICD-10-CM

## 2015-07-25 DIAGNOSIS — D582 Other hemoglobinopathies: Secondary | ICD-10-CM

## 2015-07-25 DIAGNOSIS — R945 Abnormal results of liver function studies: Secondary | ICD-10-CM

## 2015-07-25 NOTE — Progress Notes (Signed)
Order placed for f/u labs.  

## 2015-07-28 ENCOUNTER — Encounter: Payer: Self-pay | Admitting: Internal Medicine

## 2015-07-28 NOTE — Assessment & Plan Note (Signed)
Try to avoid nephrotoxic agents.  Renal function is better.  Decrease dose of meloxicam.  She states cannot function as well off.  Follow closely.  Recheck metabolic panel today.

## 2015-07-28 NOTE — Assessment & Plan Note (Signed)
No further ischemic events.  On anticoagulation.   Continue.  Continues f/u with vascular surgery.

## 2015-07-28 NOTE — Assessment & Plan Note (Signed)
Low cholesterol diet and exercise.  Follow lipid panel and liver function tests.  

## 2015-07-28 NOTE — Assessment & Plan Note (Signed)
S/p surgery and doing well.  Seeing Dr Carloyn Manner.

## 2015-07-28 NOTE — Assessment & Plan Note (Signed)
Blood pressure elevated.  Discussed with her today.  Discussed my concern regarding continuing on meloxicam.  She is hesitant to stop.  Has pain when off.  She did agree to at least decrease the dose.  Decrease to 7.5mg  q day.  Discussed the possibility of using ultram instead.  Follow.  She wants to hold on changing her blood pressure medication until seen by Dr Rockey Situ.  States he is following her blood pressure.  Continue to spot check.  Check metabolic panel.

## 2015-07-28 NOTE — Assessment & Plan Note (Signed)
Remain on eliquis.  Followed by vascular surgery.

## 2015-07-28 NOTE — Assessment & Plan Note (Signed)
Appears to be in SR today.  On eliquis.  Continues f/u with cardiology.

## 2015-07-28 NOTE — Assessment & Plan Note (Signed)
Low carb diet and exercise.  Follow met b and a1c.   

## 2015-07-30 ENCOUNTER — Ambulatory Visit (INDEPENDENT_AMBULATORY_CARE_PROVIDER_SITE_OTHER): Payer: BLUE CROSS/BLUE SHIELD | Admitting: Cardiovascular Disease

## 2015-07-30 ENCOUNTER — Encounter: Payer: Self-pay | Admitting: Cardiovascular Disease

## 2015-07-30 VITALS — BP 163/95 | HR 55 | Ht 60.0 in | Wt 214.1 lb

## 2015-07-30 DIAGNOSIS — E7800 Pure hypercholesterolemia, unspecified: Secondary | ICD-10-CM

## 2015-07-30 DIAGNOSIS — N289 Disorder of kidney and ureter, unspecified: Secondary | ICD-10-CM | POA: Diagnosis not present

## 2015-07-30 DIAGNOSIS — I4891 Unspecified atrial fibrillation: Secondary | ICD-10-CM

## 2015-07-30 DIAGNOSIS — I998 Other disorder of circulatory system: Secondary | ICD-10-CM | POA: Diagnosis not present

## 2015-07-30 MED ORDER — DOXAZOSIN MESYLATE 8 MG PO TABS: 8.0000 mg | ORAL_TABLET | Freq: Every day | ORAL | Status: DC

## 2015-07-30 NOTE — Assessment & Plan Note (Signed)
Maintaining normal sinus rhythm. No changes to her medications 

## 2015-07-30 NOTE — Assessment & Plan Note (Signed)
Recommended that she avoid high doses of meloxicam Perhaps could retry Celebrex

## 2015-07-30 NOTE — Assessment & Plan Note (Signed)
Cholesterol is at goal on the current lipid regimen. No changes to the medications were made.  

## 2015-07-30 NOTE — Assessment & Plan Note (Signed)
We have encouraged continued exercise, careful diet management in an effort to lose weight. 

## 2015-07-30 NOTE — Assessment & Plan Note (Signed)
Follow up with vascular surgery Stay on eliquis for atrial fib

## 2015-07-30 NOTE — Progress Notes (Signed)
Patient ID: Kristin James, female    DOB: Jan 25, 1956, 59 y.o.   MRN: [id number]  HPI Comments: Kristin James is a pleasant 59 year old woman with history of chronic back pain, spinal stenosis, obesity,  admission to the hospital in September 2015 for acute left lower extremity arterial ischemia secondary to thrombus. Kristin James presents for routine followup of her atrial fibrillation  In follow-up today, Kristin James denies any tachycardia concerning for arrhythmia Kristin James had back surgery March 2016, lumbar region Blood pressure has been running between 130 and 170, on average in the 150-160 range Kristin James has numbers today over the past month Tolerating higher dose losartan HCTZ and diltiazem Asymptomatic bradycardia No lower extremity edema starting a exercise program, counting her steps  EKG on today's visit shows sinus bradycardia with rate 55 bpm, no significant ST or T-wave changes  Other past medical history Prior office visit, EKG documented atrial fibrillation. Started on amiodarone, diltiazem, beta blocker and Kristin James converted to normal sinus rhythm. Atenolol dose decreased secondary to bradycardia  admitted to the hospital on May 21 2014 with acute pain in her left leg. Kristin James had no distal pulses. Her creatinine was 1.99 which improved to 1.0 with fluids. Kristin James underwent angiogram of the left lower extremity with thrombolysis with TPA of the left pill, tibial peroneal trunk, peroneal arteries and distally, thrombectomy of the vessel with restoration of flow. Kristin James does not have any sleep apnea, does have occasional snoring  Stress test was done in the hospital for chest pain. This showed no ischemia. Ejection fraction 67% EKG in the hospital showed normal sinus rhythm with rate 74 beats per minute. EKG dated 05/21/2014   No Known Allergies  Current Outpatient Prescriptions on File Prior to Visit  Medication Sig Dispense Refill  . amiodarone (PACERONE) 200 MG tablet Take 1 tablet (200 mg total) by mouth  daily. 90 tablet 3  . apixaban (ELIQUIS) 5 MG TABS tablet Take 1 tablet (5 mg total) by mouth 2 (two) times daily. 180 tablet 3  . atorvastatin (LIPITOR) 40 MG tablet Take 1 tablet (40 mg total) by mouth daily. 90 tablet 3  . diltiazem (CARDIZEM CD) 120 MG 24 hr capsule Take 1 capsule (120 mg total) by mouth daily. 90 capsule 3  . meloxicam (MOBIC) 7.5 MG tablet Take 1 tablet (7.5 mg total) by mouth daily as needed for pain. 30 tablet 1   No current facility-administered medications on file prior to visit.    Past Medical History  Diagnosis Date  . Hypertension   . Hypercholesteremia   . Hyperglycemia   . Osteoarthritis   . Proteinuria   . Hiatal hernia   . Spinal stenosis at L4-L5 level   . Peripheral arterial disease (HCC)     embolic disease   . Embolus to lower extremity [facility])     Past Surgical History  Procedure Laterality Date  . Cholecystectomy    . Knee arthroscopy    . Lower extremity angiogram    . Aortogram    . Back surgery  11/13/2014    Social History  reports that Kristin James has never smoked. Kristin James has never used smokeless tobacco. Kristin James reports that Kristin James drinks alcohol. Kristin James reports that Kristin James does not use illicit drugs.  Family History family history includes Breast cancer in her paternal grandmother; Hypertension in her father and mother; Stroke in her father. There is no history of Colon cancer.  Review of Systems  Constitutional: Negative.   Respiratory: Negative.   Cardiovascular: Negative.  Gastrointestinal: Negative.   Musculoskeletal: Positive for back pain.  Skin: Negative.   Neurological: Negative.   Hematological: Negative.   Psychiatric/Behavioral: Negative.   All other systems reviewed and are negative.   BP 163/95 mmHg  Pulse 55  Ht 5' (1.524 m)  Wt 214 lb 1.9 oz (97.124 kg)  BMI 41.82 kg/m2  Physical Exam  Constitutional: Kristin James is oriented to person, place, and time. Kristin James appears well-developed and well-nourished.  HENT:  Head: Normocephalic.   Nose: Nose normal.  Mouth/Throat: Oropharynx is clear and moist.  Eyes: Conjunctivae are normal. Pupils are equal, round, and reactive to light.  Neck: Normal range of motion. Neck supple. No JVD present.  Cardiovascular: Normal rate, regular rhythm, S1 normal, S2 normal, normal heart sounds and intact distal pulses.  Exam reveals no gallop and no friction rub.   No murmur heard. Pulmonary/Chest: Effort normal and breath sounds normal. No respiratory distress. Kristin James has no wheezes. Kristin James has no rales. Kristin James exhibits no tenderness.  Abdominal: Soft. Bowel sounds are normal. Kristin James exhibits no distension. There is no tenderness.  Musculoskeletal: Normal range of motion. Kristin James exhibits no edema or tenderness.  Lymphadenopathy:    Kristin James has no cervical adenopathy.  Neurological: Kristin James is alert and oriented to person, place, and time. Coordination normal.  Skin: Skin is warm and dry. No rash noted. No erythema.  Psychiatric: Kristin James has a normal mood and affect. Her behavior is normal. Judgment and thought content normal.    Assessment and Plan  Nursing note and vitals reviewed.

## 2015-07-30 NOTE — Assessment & Plan Note (Signed)
For blood pressure control, recommended she start Cardura 4 mg in the evening with titration up to 8 mg if needed Unable to start beta blockers given bradycardia, we'll avoid clonidine secondary to bradycardia Would avoid hydralazine given 3 times a day dosing Few other options

## 2015-07-30 NOTE — Patient Instructions (Addendum)
You are doing well.  Please start cardura/doxazosin 1/2 pill at night (4 mg), Monitor your blood pressure  If your blood pressure continues to run high, Increase up to either 4 mg twice a day (or 8 mg at night)  Please call us if you have new issues that need to be addressed before your next appt.  Your physician wants you to follow-up in: 6 months.  You will receive a reminder letter in the mail two months in advance. If you don't receive a letter, please call our office to schedule the follow-up appointment.

## 2015-08-02 ENCOUNTER — Other Ambulatory Visit: Payer: Self-pay

## 2015-08-02 ENCOUNTER — Other Ambulatory Visit: Payer: Self-pay | Admitting: Cardiovascular Disease

## 2015-08-08 ENCOUNTER — Other Ambulatory Visit: Payer: BLUE CROSS/BLUE SHIELD

## 2015-08-29 ENCOUNTER — Other Ambulatory Visit: Payer: Self-pay | Admitting: Cardiovascular Disease

## 2015-08-30 NOTE — Telephone Encounter (Signed)
Requested Prescriptions   Pending Prescriptions Disp Refills  . ELIQUIS 5 MG TABS tablet [Pharmacy Med Name: ELIQUIS 5 MG TABLET] 180 tablet 3    Sig: take 1 tablet by mouth twice a day

## 2015-09-19 ENCOUNTER — Other Ambulatory Visit: Payer: Self-pay | Admitting: Cardiovascular Disease

## 2015-09-19 ENCOUNTER — Other Ambulatory Visit: Payer: Self-pay | Admitting: Internal Medicine

## 2015-09-19 NOTE — Telephone Encounter (Signed)
Both medication and patient were seen in November. Please advise?

## 2015-09-20 NOTE — Telephone Encounter (Signed)
Please notify pt that I would like for her to try and come off meloxicam.  We discussed this at her last appt.  We decreased the mobic to q day.  Given she is on the eliquis and given increased blood pressure, I would like to have her stop the mobic.  If needs something for pain, would recommend tramadol instead.

## 2015-09-21 MED ORDER — TRAMADOL HCL 50 MG PO TABS: 50.0000 mg | ORAL_TABLET | Freq: Two times a day (BID) | ORAL | Status: DC | PRN

## 2015-09-21 NOTE — Telephone Encounter (Signed)
Spoke with pt and she stated she is willing to try tramadol for pain ,she is due to f/u with Dr.Scott in march.

## 2015-09-21 NOTE — Telephone Encounter (Signed)
I cancelled the rx for meloxicam.  Please tell her to stop taking.  I ok'd rx for tramadol #60 with no refills.

## 2015-09-21 NOTE — Telephone Encounter (Signed)
[id number]

## 2015-09-26 ENCOUNTER — Other Ambulatory Visit: Payer: Self-pay | Admitting: *Deleted

## 2015-09-26 NOTE — Telephone Encounter (Signed)
Removed Meloxicam from med list. Pt was changed to Tramadol per refill request.

## 2016-01-21 ENCOUNTER — Encounter: Payer: Self-pay | Admitting: Cardiovascular Disease

## 2016-01-21 ENCOUNTER — Ambulatory Visit (INDEPENDENT_AMBULATORY_CARE_PROVIDER_SITE_OTHER): Payer: BLUE CROSS/BLUE SHIELD | Admitting: Cardiovascular Disease

## 2016-01-21 VITALS — BP 142/84 | HR 56 | Ht 60.0 in | Wt 213.5 lb

## 2016-01-21 DIAGNOSIS — I10 Essential (primary) hypertension: Secondary | ICD-10-CM | POA: Diagnosis not present

## 2016-01-21 DIAGNOSIS — I4891 Unspecified atrial fibrillation: Secondary | ICD-10-CM | POA: Diagnosis not present

## 2016-01-21 MED ORDER — LOSARTAN POTASSIUM-HCTZ 50-12.5 MG PO TABS: 1.0000 | ORAL_TABLET | Freq: Two times a day (BID) | ORAL | Status: DC

## 2016-01-21 MED ORDER — APIXABAN 5 MG PO TABS: 5.0000 mg | ORAL_TABLET | Freq: Two times a day (BID) | ORAL | Status: DC

## 2016-01-21 NOTE — Assessment & Plan Note (Signed)
Currently on anticoagulation, denies any symptoms concerning for arrhythmia We'll continue low-dose amiodarone

## 2016-01-21 NOTE — Progress Notes (Signed)
Patient ID: Kristin James, female    DOB: 1956-06-20, 60 y.o.   MRN: [id number]  HPI Comments: Kristin James is a pleasant 59 year old woman with history of chronic back pain, spinal stenosis, obesity,  admission to the hospital in September 2015 for acute left lower extremity arterial ischemia secondary to thrombus. She presents for routine followup of her atrial fibrillation  In follow-up today, she reports that she has been feeling relatively well Blood pressure stable but had to stop the Cardura as this may blood pressure go too low She is taking losartan HCTZ 50/2.5 mg daily Reports that blood pressure is relatively well-controlled at home, systolic pressures from home in the 130 range denies any tachycardia concerning for arrhythmia Denies any symptoms from bradycardia  EKG on today's visit shows sinus bradycardia with rate 56 bpm, no significant ST or T-wave changes  Other past medical history She had back surgery March 2016, lumbar region Prior office visit, EKG documented atrial fibrillation. Started on amiodarone, diltiazem, beta blocker and she converted to normal sinus rhythm. Atenolol dose decreased secondary to bradycardia  admitted to the hospital on May 21 2014 with acute pain in her left leg. She had no distal pulses. Her creatinine was 1.99 which improved to 1.0 with fluids. She underwent angiogram of the left lower extremity with thrombolysis with TPA of the left pill, tibial peroneal trunk, peroneal arteries and distally, thrombectomy of the vessel with restoration of flow. she does not have any sleep apnea, does have occasional snoring  Stress test was done in the hospital for chest pain. This showed no ischemia. Ejection fraction 67% EKG in the hospital showed normal sinus rhythm with rate 74 beats per minute. EKG dated 05/21/2014   No Known Allergies  Current Outpatient Prescriptions on File Prior to Visit  Medication Sig Dispense Refill  . amiodarone  (PACERONE) 200 MG tablet take 1 tablet by mouth once daily 90 tablet 3  . atenolol (TENORMIN) 25 MG tablet take 1 tablet by mouth once daily 90 tablet 3  . atorvastatin (LIPITOR) 40 MG tablet take 1 tablet by mouth once daily 90 tablet 3  . diltiazem (CARDIZEM CD) 120 MG 24 hr capsule take 1 capsule by mouth once daily 90 capsule 3  . traMADol (ULTRAM) 50 MG tablet Take 1 tablet (50 mg total) by mouth 2 (two) times daily as needed. 60 tablet 0   No current facility-administered medications on file prior to visit.    Past Medical History  Diagnosis Date  . Hypertension   . Hypercholesteremia   . Hyperglycemia   . Osteoarthritis   . Proteinuria   . Hiatal hernia   . Spinal stenosis at L4-L5 level   . Peripheral arterial disease (HCC)     embolic disease   . Embolus to lower extremity [facility])     Past Surgical History  Procedure Laterality Date  . Cholecystectomy    . Knee arthroscopy    . Lower extremity angiogram    . Aortogram    . Back surgery  11/13/2014    Social History  reports that she has never smoked. She has never used smokeless tobacco. She reports that she drinks alcohol. She reports that she does not use illicit drugs.  Family History family history includes Breast cancer in her paternal grandmother; Hypertension in her father and mother; Stroke in her father. There is no history of Colon cancer.  Review of Systems  Constitutional: Negative.   Respiratory: Negative.   Cardiovascular: Negative.  Gastrointestinal: Negative.   Musculoskeletal: Positive for back pain.  Skin: Negative.   Neurological: Negative.   Hematological: Negative.   Psychiatric/Behavioral: Negative.   All other systems reviewed and are negative.   BP 142/84 mmHg  Pulse 56  Ht 5' (1.524 m)  Wt 213 lb 8 oz (96.843 kg)  BMI 41.70 kg/m2  Physical Exam  Constitutional: She is oriented to person, place, and time. She appears well-developed and well-nourished.  HENT:  Head:  Normocephalic.  Nose: Nose normal.  Mouth/Throat: Oropharynx is clear and moist.  Eyes: Conjunctivae are normal. Pupils are equal, round, and reactive to light.  Neck: Normal range of motion. Neck supple. No JVD present.  Cardiovascular: Normal rate, regular rhythm, S1 normal, S2 normal, normal heart sounds and intact distal pulses.  Exam reveals no gallop and no friction rub.   No murmur heard. Pulmonary/Chest: Effort normal and breath sounds normal. No respiratory distress. She has no wheezes. She has no rales. She exhibits no tenderness.  Abdominal: Soft. Bowel sounds are normal. She exhibits no distension. There is no tenderness.  Musculoskeletal: Normal range of motion. She exhibits no edema or tenderness.  Lymphadenopathy:    She has no cervical adenopathy.  Neurological: She is alert and oriented to person, place, and time. Coordination normal.  Skin: Skin is warm and dry. No rash noted. No erythema.  Psychiatric: She has a normal mood and affect. Her behavior is normal. Judgment and thought content normal.    Assessment and Plan  Nursing note and vitals reviewed.

## 2016-01-21 NOTE — Assessment & Plan Note (Addendum)
Recommended that she closely monitor her blood pressure Elevated on initial evaluation, Mildly elevated on recheck Recommended that she take extra losartan HCTZ 50/12.5 mg daily for elevated pressures

## 2016-01-21 NOTE — Patient Instructions (Addendum)
You are doing well. No medication changes were made.  Please call us if you have new issues that need to be addressed before your next appt.  Your physician wants you to follow-up in: 6 months.  You will receive a reminder letter in the mail two months in advance. If you don't receive a letter, please call our office to schedule the follow-up appointment.   

## 2016-01-21 NOTE — Assessment & Plan Note (Signed)
We have encouraged continued exercise, careful diet management in an effort to lose weight. 

## 2016-03-24 ENCOUNTER — Encounter: Payer: Self-pay | Admitting: Internal Medicine

## 2016-03-24 ENCOUNTER — Ambulatory Visit (INDEPENDENT_AMBULATORY_CARE_PROVIDER_SITE_OTHER): Payer: BLUE CROSS/BLUE SHIELD | Admitting: Internal Medicine

## 2016-03-24 VITALS — BP 120/84 | HR 68 | Ht 59.75 in | Wt 216.8 lb

## 2016-03-24 DIAGNOSIS — Z1211 Encounter for screening for malignant neoplasm of colon: Secondary | ICD-10-CM

## 2016-03-24 DIAGNOSIS — R739 Hyperglycemia, unspecified: Secondary | ICD-10-CM

## 2016-03-24 DIAGNOSIS — N289 Disorder of kidney and ureter, unspecified: Secondary | ICD-10-CM

## 2016-03-24 DIAGNOSIS — Z0000 Encounter for general adult medical examination without abnormal findings: Secondary | ICD-10-CM | POA: Diagnosis not present

## 2016-03-24 DIAGNOSIS — I4891 Unspecified atrial fibrillation: Secondary | ICD-10-CM

## 2016-03-24 DIAGNOSIS — I743 Embolism and thrombosis of arteries of the lower extremities: Secondary | ICD-10-CM

## 2016-03-24 DIAGNOSIS — R232 Flushing: Secondary | ICD-10-CM

## 2016-03-24 DIAGNOSIS — I10 Essential (primary) hypertension: Secondary | ICD-10-CM | POA: Diagnosis not present

## 2016-03-24 DIAGNOSIS — E7800 Pure hypercholesterolemia, unspecified: Secondary | ICD-10-CM

## 2016-03-24 DIAGNOSIS — N951 Menopausal and female climacteric states: Secondary | ICD-10-CM

## 2016-03-24 DIAGNOSIS — M5440 Lumbago with sciatica, unspecified side: Secondary | ICD-10-CM

## 2016-03-24 MED ORDER — VENLAFAXINE HCL ER 37.5 MG PO CP24: 37.5000 mg | ORAL_CAPSULE | Freq: Every day | ORAL | 1 refills | Status: DC

## 2016-03-24 NOTE — Progress Notes (Addendum)
Subjective:    Patient ID: Kristin James, female    DOB: 14-Oct-1955, 60 y.o.   MRN: [id number]  HPI  Patient here for her physical exam.  States she is doing relatively well.  Her biggest complaint is that of hot flashes.  Off estrogen.  We discussed treatment options. She has tried otc supplements with no improvement.  We discussed other options.  Discussed effexor.  Will need to monitor blood pressure.  No chest pain.  No sob. No acid reflux.  No abdominal pain or cramping.  Bowels stable.  Overdue colonoscopy.  States spoke to Dr Rockey Situ and he is ok with her proceeding with colonoscopy.     Past Medical History:  Diagnosis Date  . Embolus to lower extremity ([place])   . Hiatal hernia   . Hypercholesteremia   . Hyperglycemia   . Hypertension   . Osteoarthritis   . Peripheral arterial disease (HCC)    embolic disease   . Proteinuria   . Spinal stenosis at L4-L5 level    Past Surgical History:  Procedure Laterality Date  . AORTOGRAM    . BACK SURGERY  11/13/2014  . CHOLECYSTECTOMY    . KNEE ARTHROSCOPY    . LOWER EXTREMITY ANGIOGRAM     Family History  Problem Relation Age of Onset  . Hypertension Mother   . Stroke Father     3 strokes  . Hypertension Father   . Breast cancer Paternal Grandmother   . Colon cancer Neg Hx    Social History   Social History  . Marital status: Married    Spouse name: N/A  . Number of children: 2  . Years of education: N/A   Social History Main Topics  . Smoking status: Never Smoker  . Smokeless tobacco: Never Used  . Alcohol use 0.0 oz/week  . Drug use: No  . Sexual activity: Not Asked   Other Topics Concern  . None   Social History Narrative  . None    Outpatient Encounter Prescriptions as of 03/24/2016  Medication Sig  . amiodarone (PACERONE) 200 MG tablet take 1 tablet by mouth once daily  . apixaban (ELIQUIS) 5 MG TABS tablet Take 1 tablet (5 mg total) by mouth 2 (two) times daily.  Marland Kitchen atenolol (TENORMIN) 25 MG tablet  take 1 tablet by mouth once daily  . atorvastatin (LIPITOR) 40 MG tablet take 1 tablet by mouth once daily  . diltiazem (CARDIZEM CD) 120 MG 24 hr capsule take 1 capsule by mouth once daily  . losartan-hydrochlorothiazide (HYZAAR) 50-12.5 MG tablet Take 1 tablet by mouth 2 (two) times daily.  . meloxicam (MOBIC) 7.5 MG tablet Take 7.5 mg by mouth daily.   Marland Kitchen venlafaxine XR (EFFEXOR XR) 37.5 MG 24 hr capsule Take 1 capsule (37.5 mg total) by mouth daily with breakfast.  . [DISCONTINUED] traMADol (ULTRAM) 50 MG tablet Take 1 tablet (50 mg total) by mouth 2 (two) times daily as needed.   No facility-administered encounter medications on file as of 03/24/2016.     Review of Systems  Constitutional: Negative for appetite change and unexpected weight change.  HENT: Negative for congestion and sinus pressure.   Eyes: Negative for pain and visual disturbance.  Respiratory: Negative for cough, chest tightness and shortness of breath.   Cardiovascular: Negative for chest pain, palpitations and leg swelling.  Gastrointestinal: Negative for abdominal pain, diarrhea, nausea and vomiting.  Endocrine:       Hot flashes as outlined.  Genitourinary: Negative for difficulty urinating and dysuria.  Musculoskeletal: Negative for back pain and joint swelling.  Skin: Negative for color change and rash.  Neurological: Negative for dizziness, light-headedness and headaches.  Hematological: Negative for adenopathy. Does not bruise/bleed easily.  Psychiatric/Behavioral: Negative for agitation and dysphoric mood.       Objective:     Blood pressure rechecked by me:  142/78  Physical Exam  Constitutional: She is oriented to person, place, and time. She appears well-developed and well-nourished. No distress.  HENT:  Nose: Nose normal.  Mouth/Throat: Oropharynx is clear and moist.  Eyes: Right eye exhibits no discharge. Left eye exhibits no discharge. No scleral icterus.  Neck: Neck supple. No thyromegaly  present.  Cardiovascular: Normal rate and regular rhythm.   Pulmonary/Chest: Breath sounds normal. No accessory muscle usage. No tachypnea. No respiratory distress. She has no decreased breath sounds. She has no wheezes. She has no rhonchi. Right breast exhibits no inverted nipple, no mass, no nipple discharge and no tenderness (no axillary adenopathy). Left breast exhibits no inverted nipple, no mass, no nipple discharge and no tenderness (no axilarry adenopathy).  Abdominal: Soft. Bowel sounds are normal. There is no tenderness.  Musculoskeletal: She exhibits no edema or tenderness.  Lymphadenopathy:    She has no cervical adenopathy.  Neurological: She is alert and oriented to person, place, and time.  Skin: Skin is warm. No rash noted. No erythema.  Psychiatric: She has a normal mood and affect. Her behavior is normal.    BP 120/84   Pulse 68   Ht 4' 11.75" (1.518 m)   Wt 216 lb 12.8 oz (98.3 kg)   SpO2 96%   BMI 42.70 kg/m  Wt Readings from Last 3 Encounters:  03/24/16 216 lb 12.8 oz (98.3 kg)  01/21/16 213 lb 8 oz (96.8 kg)  07/30/15 214 lb 1.9 oz (97.1 kg)     Lab Results  Component Value Date   WBC 7.6 07/24/2015   HGB 16.3 (H) 07/24/2015   HCT 48.3 (H) 07/24/2015   PLT 231.0 07/24/2015   GLUCOSE 115 (H) 07/24/2015   CHOL 180 07/24/2015   TRIG 83.0 07/24/2015   HDL 54.70 07/24/2015   LDLCALC 109 (H) 07/24/2015   ALT 38 (H) 07/24/2015   AST 25 07/24/2015   NA 140 07/24/2015   K 4.0 07/24/2015   CL 103 07/24/2015   CREATININE 1.02 07/24/2015   BUN 26 (H) 07/24/2015   CO2 30 07/24/2015   TSH 2.32 07/24/2015   INR 1.0 05/23/2014   HGBA1C 6.3 07/24/2015   MICROALBUR 9.3 (H) 03/20/2015    Mm Digital Screening Bilateral  Result Date: 05/24/2015 CLINICAL DATA:  Screening. EXAM: DIGITAL SCREENING BILATERAL MAMMOGRAM WITH CAD COMPARISON:  Previous exam(s). ACR Breast Density Category b: There are scattered areas of fibroglandular density. FINDINGS: There are no  findings suspicious for malignancy. Images were processed with CAD. IMPRESSION: No mammographic evidence of malignancy. A result letter of this screening mammogram will be mailed directly to the patient. RECOMMENDATION: Screening mammogram in one year. (Code:SM-B-01Y) BI-RADS CATEGORY  1: Negative. Electronically Signed   By: Fidela Salisbury M.D.   On: 05/24/2015 09:35       Assessment & Plan:   Problem List Items Addressed This Visit    Atrial fibrillation with RVR ([place])    Follow by cardiology.  Doing well on current medication regimen.        Back pain    S/p back surgery.  Back better.  Sees  Dr Carloyn Manner.        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  Has been followed by vascular.        Health care maintenance    Physical today 03/24/16.  Mammogram 05/24/15 - Birads I.  Colonoscopy 06/2009.  Recommended f/u in five years.  On eliquis.        Hot flashes    Off estrogen and would recommend remaining off.  Discussed at length with her today.  She has tried otc medications.  Did not help.  Feels needs something.  Symptoms are worsening.  Will try low dose effexor.  Follow pressures.        Hypercholesterolemia    On lipitor.  Low cholesterol diet and exercise.  Follow lipid panel and liver function tests.        Relevant Orders   Lipid panel   Hepatic function panel   Hyperglycemia    Low carb diet and exercise.  Follow met b and a1c.        Relevant Orders   Hemoglobin A1c   Microalbumin / creatinine urine ratio   Hypertension    States blood pressures on outside checks averaging <140/90.  Blood pressure as outlined.  Follow pressures.  Adjust medication if needed.  May need to increased the losartan/hctz if persistent elevation.  Was told to take prn by cardiology.  Follow.  Check metabolic panel.        Relevant Orders   CBC with Differential/Platelet   Basic metabolic panel   Morbid obesity ([place])    Diet and exercise.         Renal insufficiency    Creatinine 1.02 last check.  Stay hydrated.  Recheck met b.  Avoid increased antiinflammatories.  On 7.40m of meloxicam.  States needs to function.  Have discussed side effects and risks of medication.  Follow.        Other Visit Diagnoses    Colon cancer screening    -  Primary   Routine general medical examination at a health care facility           SEinar Pheasant MD

## 2016-03-24 NOTE — Assessment & Plan Note (Signed)
Physical today 03/24/16.  Mammogram 05/24/15 - Birads I.  Colonoscopy 06/2009.  Recommended f/u in five years.  On eliquis.

## 2016-03-25 ENCOUNTER — Encounter: Payer: Self-pay | Admitting: Internal Medicine

## 2016-03-25 DIAGNOSIS — R232 Flushing: Secondary | ICD-10-CM | POA: Insufficient documentation

## 2016-03-25 NOTE — Assessment & Plan Note (Signed)
On lipitor.  Low cholesterol diet and exercise.  Follow lipid panel and liver function tests.   

## 2016-03-25 NOTE — Assessment & Plan Note (Signed)
States blood pressures on outside checks averaging <140/90.  Blood pressure as outlined.  Follow pressures.  Adjust medication if needed.  May need to increased the losartan/hctz if persistent elevation.  Was told to take prn by cardiology.  Follow.  Check metabolic panel.

## 2016-03-25 NOTE — Assessment & Plan Note (Signed)
Follow by cardiology.  Doing well on current medication regimen.

## 2016-03-25 NOTE — Assessment & Plan Note (Signed)
Low carb diet and exercise.  Follow met b and a1c.   

## 2016-03-25 NOTE — Addendum Note (Signed)
Addended by: Alisa Graff on: 03/25/2016 06:58 AM   Modules accepted: Orders

## 2016-03-25 NOTE — Assessment & Plan Note (Signed)
Creatinine 1.02 last check.  Stay hydrated.  Recheck met b.  Avoid increased antiinflammatories.  On 7.25m of meloxicam.  States needs to function.  Have discussed side effects and risks of medication.  Follow.

## 2016-03-25 NOTE — Assessment & Plan Note (Signed)
Off estrogen and would recommend remaining off.  Discussed at length with her today.  She has tried otc medications.  Did not help.  Feels needs something.  Symptoms are worsening.  Will try low dose effexor.  Follow pressures.

## 2016-03-25 NOTE — Assessment & Plan Note (Signed)
Remains on eliquis.  Has been followed by vascular.

## 2016-03-25 NOTE — Assessment & Plan Note (Signed)
Diet and exercise.   

## 2016-03-25 NOTE — Assessment & Plan Note (Signed)
S/p back surgery.  Back better.  Sees Dr Carloyn Manner.

## 2016-04-08 ENCOUNTER — Other Ambulatory Visit (INDEPENDENT_AMBULATORY_CARE_PROVIDER_SITE_OTHER): Payer: BLUE CROSS/BLUE SHIELD

## 2016-04-08 DIAGNOSIS — E7800 Pure hypercholesterolemia, unspecified: Secondary | ICD-10-CM

## 2016-04-08 DIAGNOSIS — I10 Essential (primary) hypertension: Secondary | ICD-10-CM

## 2016-04-08 DIAGNOSIS — R739 Hyperglycemia, unspecified: Secondary | ICD-10-CM

## 2016-04-08 LAB — MICROALBUMIN / CREATININE URINE RATIO
Creatinine,U: 153.2 mg/dL
MICROALB/CREAT RATIO: 23.2 mg/g (ref 0.0–30.0)
Microalb, Ur: 35.5 mg/dL — ABNORMAL HIGH (ref 0.0–1.9)

## 2016-04-08 LAB — HEPATIC FUNCTION PANEL
ALK PHOS: 108 U/L (ref 39–117)
ALT: 31 U/L (ref 0–35)
AST: 22 U/L (ref 0–37)
Albumin: 4 g/dL (ref 3.5–5.2)
BILIRUBIN DIRECT: 0.3 mg/dL (ref 0.0–0.3)
BILIRUBIN TOTAL: 1.3 mg/dL — AB (ref 0.2–1.2)
TOTAL PROTEIN: 7.2 g/dL (ref 6.0–8.3)

## 2016-04-08 LAB — CBC WITH DIFFERENTIAL/PLATELET
BASOS PCT: 0.3 % (ref 0.0–3.0)
Basophils Absolute: 0 10*3/uL (ref 0.0–0.1)
EOS PCT: 1.6 % (ref 0.0–5.0)
Eosinophils Absolute: 0.1 10*3/uL (ref 0.0–0.7)
HCT: 47.1 % — ABNORMAL HIGH (ref 36.0–46.0)
HEMOGLOBIN: 16.1 g/dL — AB (ref 12.0–15.0)
Lymphocytes Relative: 23.8 % (ref 12.0–46.0)
Lymphs Abs: 2 10*3/uL (ref 0.7–4.0)
MCHC: 34.2 g/dL (ref 30.0–36.0)
MCV: 90.1 fl (ref 78.0–100.0)
Monocytes Absolute: 0.7 10*3/uL (ref 0.1–1.0)
Monocytes Relative: 8.2 % (ref 3.0–12.0)
NEUTROS ABS: 5.6 10*3/uL (ref 1.4–7.7)
Neutrophils Relative %: 66.1 % (ref 43.0–77.0)
PLATELETS: 214 10*3/uL (ref 150.0–400.0)
RBC: 5.23 Mil/uL — ABNORMAL HIGH (ref 3.87–5.11)
RDW: 13.4 % (ref 11.5–15.5)
WBC: 8.5 10*3/uL (ref 4.0–10.5)

## 2016-04-08 LAB — BASIC METABOLIC PANEL
BUN: 22 mg/dL (ref 6–23)
CO2: 30 meq/L (ref 19–32)
Calcium: 9.4 mg/dL (ref 8.4–10.5)
Chloride: 104 mEq/L (ref 96–112)
Creatinine, Ser: 1.12 mg/dL (ref 0.40–1.20)
GFR: 52.61 mL/min — ABNORMAL LOW (ref >60.00)
GLUCOSE: 114 mg/dL — AB (ref 70–99)
POTASSIUM: 4.5 meq/L (ref 3.5–5.1)
SODIUM: 141 meq/L (ref 135–145)

## 2016-04-08 LAB — LIPID PANEL
CHOL/HDL RATIO: 3
CHOLESTEROL: 161 mg/dL (ref 0–200)
HDL: 51.2 mg/dL (ref >39.00)
LDL CALC: 93 mg/dL (ref 0–99)
NonHDL: 109.69
TRIGLYCERIDES: 81 mg/dL (ref 0.0–149.0)
VLDL: 16.2 mg/dL (ref 0.0–40.0)

## 2016-04-08 LAB — HEMOGLOBIN A1C: Hgb A1c MFr Bld: 6.1 % (ref 4.6–6.5)

## 2016-04-09 ENCOUNTER — Encounter: Payer: Self-pay | Admitting: *Deleted

## 2016-04-09 ENCOUNTER — Other Ambulatory Visit: Payer: Self-pay | Admitting: Internal Medicine

## 2016-04-09 DIAGNOSIS — Z1231 Encounter for screening mammogram for malignant neoplasm of breast: Secondary | ICD-10-CM

## 2016-04-18 ENCOUNTER — Telehealth: Payer: Self-pay | Admitting: Internal Medicine

## 2016-04-18 NOTE — Telephone Encounter (Signed)
Pt called stating that medication of venlafaxine XR (EFFEXOR XR) 37.5 MG 24 hr capsule is not working pt states she is still having hot flashes. Please advise?  Call pt @ [phone]. Thank you!

## 2016-04-18 NOTE — Telephone Encounter (Signed)
Please advise medication change? 

## 2016-04-21 ENCOUNTER — Other Ambulatory Visit: Payer: Self-pay | Admitting: *Deleted

## 2016-04-21 MED ORDER — VENLAFAXINE HCL ER 37.5 MG PO CP24: 75.0000 mg | ORAL_CAPSULE | Freq: Every day | ORAL | 1 refills | Status: DC

## 2016-04-21 NOTE — Telephone Encounter (Signed)
Can try to increase the dose to 75mg  q day.  Can see if this dose helps.  Will need to monitor her blood pressure while on the medication.  Check and record and let me know if elevation.

## 2016-04-21 NOTE — Telephone Encounter (Signed)
Pt notified & will try that & let us know if her BP increases.

## 2016-04-21 NOTE — Progress Notes (Unsigned)
Sent in new Rx with updated directions on Venlafaxine dose. Increased to 75mg  daily

## 2016-04-24 DIAGNOSIS — M47816 Spondylosis without myelopathy or radiculopathy, lumbar region: Secondary | ICD-10-CM | POA: Diagnosis not present

## 2016-04-29 DIAGNOSIS — D126 Benign neoplasm of colon, unspecified: Secondary | ICD-10-CM | POA: Insufficient documentation

## 2016-04-29 DIAGNOSIS — Z86010 Personal history of colonic polyps: Secondary | ICD-10-CM | POA: Diagnosis not present

## 2016-05-07 ENCOUNTER — Other Ambulatory Visit (INDEPENDENT_AMBULATORY_CARE_PROVIDER_SITE_OTHER): Payer: BLUE CROSS/BLUE SHIELD

## 2016-05-07 LAB — HEPATIC FUNCTION PANEL
ALK PHOS: 106 U/L (ref 39–117)
ALT: 42 U/L — AB (ref 0–35)
AST: 30 U/L (ref 0–37)
Albumin: 3.8 g/dL (ref 3.5–5.2)
BILIRUBIN DIRECT: 0.2 mg/dL (ref 0.0–0.3)
BILIRUBIN TOTAL: 0.6 mg/dL (ref 0.2–1.2)
Total Protein: 6.8 g/dL (ref 6.0–8.3)

## 2016-05-08 ENCOUNTER — Encounter: Payer: Self-pay | Admitting: *Deleted

## 2016-05-23 ENCOUNTER — Ambulatory Visit
Admission: RE | Admit: 2016-05-23 | Discharge: 2016-05-23 | Disposition: A | Discharge status: Home / Self Care | Payer: BLUE CROSS/BLUE SHIELD | Source: Ambulatory Visit | Attending: Internal Medicine | Admitting: Internal Medicine

## 2016-05-23 DIAGNOSIS — Z1231 Encounter for screening mammogram for malignant neoplasm of breast: Secondary | ICD-10-CM | POA: Diagnosis not present

## 2016-05-29 ENCOUNTER — Other Ambulatory Visit: Payer: BLUE CROSS/BLUE SHIELD

## 2016-06-16 ENCOUNTER — Other Ambulatory Visit: Payer: Self-pay

## 2016-06-16 ENCOUNTER — Other Ambulatory Visit: Payer: Self-pay | Admitting: Internal Medicine

## 2016-07-28 ENCOUNTER — Encounter: Payer: Self-pay | Admitting: Internal Medicine

## 2016-07-28 ENCOUNTER — Ambulatory Visit (INDEPENDENT_AMBULATORY_CARE_PROVIDER_SITE_OTHER): Payer: BLUE CROSS/BLUE SHIELD | Admitting: Internal Medicine

## 2016-07-28 VITALS — BP 146/100 | HR 54 | Temp 97.8°F | Ht 60.0 in | Wt 208.8 lb

## 2016-07-28 DIAGNOSIS — N289 Disorder of kidney and ureter, unspecified: Secondary | ICD-10-CM

## 2016-07-28 DIAGNOSIS — M4800 Spinal stenosis, site unspecified: Secondary | ICD-10-CM

## 2016-07-28 DIAGNOSIS — E7800 Pure hypercholesterolemia, unspecified: Secondary | ICD-10-CM | POA: Diagnosis not present

## 2016-07-28 DIAGNOSIS — R51 Headache: Secondary | ICD-10-CM | POA: Diagnosis not present

## 2016-07-28 DIAGNOSIS — I10 Essential (primary) hypertension: Secondary | ICD-10-CM

## 2016-07-28 DIAGNOSIS — I4891 Unspecified atrial fibrillation: Secondary | ICD-10-CM

## 2016-07-28 DIAGNOSIS — R519 Headache, unspecified: Secondary | ICD-10-CM

## 2016-07-28 DIAGNOSIS — D329 Benign neoplasm of meninges, unspecified: Secondary | ICD-10-CM | POA: Insufficient documentation

## 2016-07-28 DIAGNOSIS — R739 Hyperglycemia, unspecified: Secondary | ICD-10-CM | POA: Diagnosis not present

## 2016-07-28 LAB — BASIC METABOLIC PANEL
BUN: 23 mg/dL (ref 6–23)
CHLORIDE: 105 meq/L (ref 96–112)
CO2: 30 meq/L (ref 19–32)
CREATININE: 1.13 mg/dL (ref 0.40–1.20)
Calcium: 10 mg/dL (ref 8.4–10.5)
GFR: 52.02 mL/min — ABNORMAL LOW (ref >60.00)
GLUCOSE: 113 mg/dL — AB (ref 70–99)
Potassium: 4.9 mEq/L (ref 3.5–5.1)
Sodium: 142 mEq/L (ref 135–145)

## 2016-07-28 LAB — CBC WITH DIFFERENTIAL/PLATELET
BASOS ABS: 0.1 10*3/uL (ref 0.0–0.1)
Basophils Relative: 1.2 % (ref 0.0–3.0)
EOS ABS: 0.1 10*3/uL (ref 0.0–0.7)
Eosinophils Relative: 1.1 % (ref 0.0–5.0)
HEMATOCRIT: 48.4 % — AB (ref 36.0–46.0)
HEMOGLOBIN: 16.3 g/dL — AB (ref 12.0–15.0)
LYMPHS PCT: 29.7 % (ref 12.0–46.0)
Lymphs Abs: 2.5 10*3/uL (ref 0.7–4.0)
MCHC: 33.7 g/dL (ref 30.0–36.0)
MCV: 92.3 fl (ref 78.0–100.0)
MONO ABS: 0.8 10*3/uL (ref 0.1–1.0)
Monocytes Relative: 9.8 % (ref 3.0–12.0)
NEUTROS ABS: 5 10*3/uL (ref 1.4–7.7)
Neutrophils Relative %: 58.2 % (ref 43.0–77.0)
PLATELETS: 235 10*3/uL (ref 150.0–400.0)
RBC: 5.25 Mil/uL — ABNORMAL HIGH (ref 3.87–5.11)
RDW: 14 % (ref 11.5–15.5)
WBC: 8.5 10*3/uL (ref 4.0–10.5)

## 2016-07-28 LAB — LIPID PANEL
CHOL/HDL RATIO: 3
Cholesterol: 180 mg/dL (ref 0–200)
HDL: 51.7 mg/dL (ref >39.00)
LDL CALC: 111 mg/dL — AB (ref 0–99)
NonHDL: 127.92
Triglycerides: 85 mg/dL (ref 0.0–149.0)
VLDL: 17 mg/dL (ref 0.0–40.0)

## 2016-07-28 LAB — HEPATIC FUNCTION PANEL
ALT: 41 U/L — AB (ref 0–35)
AST: 26 U/L (ref 0–37)
Albumin: 4.4 g/dL (ref 3.5–5.2)
Alkaline Phosphatase: 99 U/L (ref 39–117)
BILIRUBIN DIRECT: 0.1 mg/dL (ref 0.0–0.3)
BILIRUBIN TOTAL: 0.7 mg/dL (ref 0.2–1.2)
Total Protein: 7.6 g/dL (ref 6.0–8.3)

## 2016-07-28 LAB — TSH: TSH: 1.56 u[IU]/mL (ref 0.35–4.50)

## 2016-07-28 LAB — SEDIMENTATION RATE: SED RATE: 2 mm/h (ref 0–30)

## 2016-07-28 LAB — HEMOGLOBIN A1C: HEMOGLOBIN A1C: 6.1 % (ref 4.6–6.5)

## 2016-07-28 MED ORDER — GABAPENTIN 100 MG PO CAPS: 100.0000 mg | ORAL_CAPSULE | Freq: Three times a day (TID) | ORAL | 1 refills | Status: DC

## 2016-07-28 MED ORDER — LOSARTAN POTASSIUM-HCTZ 100-12.5 MG PO TABS: 1.0000 | ORAL_TABLET | Freq: Every day | ORAL | 1 refills | Status: DC

## 2016-07-28 NOTE — Progress Notes (Signed)
Patient ID: Kristin James, female   DOB: [birth year], 60 y.o.   MRN: [id number]   Subjective:    Patient ID: Kristin James, female    DOB: 06-27-1956, 60 y.o.   MRN: [id number]  HPI  Patient here for a scheduled follow up.  She has been having increased pain on the right side of her head.  She saw Dr Ezzard Flax 12/18/15 with slight pain.  Reevaluated a month later because of persistent pain.  He felt like this was an old crown.  Referred to Dr Aleda Grana in 05/2016.  Had a re - root canal for the same pain and tingling.  Dr Lewanda Rife at [facility] saw her 05/28/16 and felt like it was the tooth causing the pain.  Felt needed to be removed.  06/06/16 saw Dr Barry Dienes.  Felt might be TMJ.  Gave her a muscle relaxer and a mouth guard to wear at night.  At the end of October he set her up for a f/u.  Still no better.  States she left side of her face hurts to touch.  Increased pain with eating and talking.  Had her tooth pulled 07/20/16.  Pain comes and goes still.  On the days that are bad, it is difficult for her to eat.  Planning for implant down the road.  No vision change.  No other headache.  No chest pain.  Breathing stable.  No vomiting. Has been on abx.  Blood pressure has been fluctuating up and down with pain, etc.     Past Medical History:  Diagnosis Date  . Embolus to lower extremity ([place])   . Hiatal hernia   . Hypercholesteremia   . Hyperglycemia   . Hypertension   . Osteoarthritis   . Peripheral arterial disease (HCC)    embolic disease   . Proteinuria   . Spinal stenosis at L4-L5 level    Past Surgical History:  Procedure Laterality Date  . AORTOGRAM    . BACK SURGERY  11/13/2014  . CHOLECYSTECTOMY    . KNEE ARTHROSCOPY    . LOWER EXTREMITY ANGIOGRAM     Family History  Problem Relation Age of Onset  . Hypertension Mother   . Stroke Father     3 strokes  . Hypertension Father   . Breast cancer Paternal Grandmother   . Colon cancer Neg Hx    Social History   Social History   . Marital status: Married    Spouse name: N/A  . Number of children: 2  . Years of education: N/A   Social History Main Topics  . Smoking status: Never Smoker  . Smokeless tobacco: Never Used  . Alcohol use 0.0 oz/week  . Drug use: No  . Sexual activity: Not Asked   Other Topics Concern  . None   Social History Narrative  . None    Outpatient Encounter Prescriptions as of 07/28/2016  Medication Sig  . apixaban (ELIQUIS) 5 MG TABS tablet Take 1 tablet (5 mg total) by mouth 2 (two) times daily.  Marland Kitchen atorvastatin (LIPITOR) 40 MG tablet take 1 tablet by mouth once daily  . diltiazem (CARDIZEM CD) 120 MG 24 hr capsule take 1 capsule by mouth once daily  . meloxicam (MOBIC) 7.5 MG tablet Take 7.5 mg by mouth daily.   . [DISCONTINUED] amiodarone (PACERONE) 200 MG tablet take 1 tablet by mouth once daily  . [DISCONTINUED] atenolol (TENORMIN) 25 MG tablet take 1 tablet by mouth once daily  . [DISCONTINUED]  losartan-hydrochlorothiazide (HYZAAR) 50-12.5 MG tablet Take 1 tablet by mouth 2 (two) times daily.  . [DISCONTINUED] venlafaxine XR (EFFEXOR-XR) 37.5 MG 24 hr capsule take 2 capsules by mouth once daily with BREAKFAST  . gabapentin (NEURONTIN) 100 MG capsule Take 1 capsule (100 mg total) by mouth 3 (three) times daily.  Marland Kitchen losartan-hydrochlorothiazide (HYZAAR) 100-12.5 MG tablet Take 1 tablet by mouth daily.   No facility-administered encounter medications on file as of 07/28/2016.     Review of Systems  Constitutional:       Unable to eat like normal secondary to increased pain.  Some weight loss.    HENT: Negative for congestion and sinus pain.   Respiratory: Negative for cough, chest tightness and shortness of breath.   Cardiovascular: Negative for chest pain, palpitations and leg swelling.  Gastrointestinal: Negative for abdominal pain, diarrhea, nausea and vomiting.  Genitourinary: Negative for difficulty urinating and dysuria.  Musculoskeletal: Negative for joint swelling  and myalgias.  Skin: Negative for color change and rash.  Neurological: Negative for dizziness.       Head pain as outlined.    Psychiatric/Behavioral: Negative for agitation and dysphoric mood.       Objective:     Blood pressure rechecked by me:  144/88-92  Physical Exam  Constitutional: She appears well-developed and well-nourished. No distress.  HENT:  Nose: Nose normal.  Mouth/Throat: Oropharynx is clear and moist.  Neck: Neck supple. No thyromegaly present.  Cardiovascular: Normal rate and regular rhythm.   Pulmonary/Chest: Breath sounds normal. No respiratory distress. She has no wheezes.  Abdominal: Soft. Bowel sounds are normal. There is no tenderness.  Musculoskeletal: She exhibits no edema or tenderness.  Increased pain to palpation over the right side of her head.    Lymphadenopathy:    She has no cervical adenopathy.  Skin: No rash noted. No erythema.  Psychiatric: She has a normal mood and affect. Her behavior is normal.    BP (!) 146/100   Pulse (!) 54   Temp 97.8 F (36.6 C) (Oral)   Ht 5' (1.524 m)   Wt 208 lb 12.8 oz (94.7 kg)   SpO2 98%   BMI 40.78 kg/m  Wt Readings from Last 3 Encounters:  07/28/16 208 lb 12.8 oz (94.7 kg)  03/24/16 216 lb 12.8 oz (98.3 kg)  01/21/16 213 lb 8 oz (96.8 kg)     Lab Results  Component Value Date   WBC 8.5 07/28/2016   HGB 16.3 (H) 07/28/2016   HCT 48.4 (H) 07/28/2016   PLT 235.0 07/28/2016   GLUCOSE 113 (H) 07/28/2016   CHOL 180 07/28/2016   TRIG 85.0 07/28/2016   HDL 51.70 07/28/2016   LDLCALC 111 (H) 07/28/2016   ALT 41 (H) 07/28/2016   AST 26 07/28/2016   NA 142 07/28/2016   K 4.9 07/28/2016   CL 105 07/28/2016   CREATININE 1.13 07/28/2016   BUN 23 07/28/2016   CO2 30 07/28/2016   TSH 1.56 07/28/2016   INR 1.0 05/23/2014   HGBA1C 6.1 07/28/2016   MICROALBUR 35.5 (H) 04/08/2016    Mm Digital Screening Bilateral  Result Date: 05/23/2016 CLINICAL DATA:  Screening. EXAM: DIGITAL SCREENING  BILATERAL MAMMOGRAM WITH CAD COMPARISON:  Previous exam(s). ACR Breast Density Category b: There are scattered areas of fibroglandular density. FINDINGS: There are no findings suspicious for malignancy. Images were processed with CAD. IMPRESSION: No mammographic evidence of malignancy. A result letter of this screening mammogram will be mailed directly to the patient. RECOMMENDATION: Screening  mammogram in one year. (Code:SM-B-01Y) BI-RADS CATEGORY  1: Negative. Electronically Signed   By: Lajean Manes M.D.   On: 05/23/2016 09:32       Assessment & Plan:   Problem List Items Addressed This Visit    Atrial fibrillation with RVR ([place])    Followed by cardiology.  Doing well on current regimen.        Relevant Medications   losartan-hydrochlorothiazide (HYZAAR) 100-12.5 MG tablet   Head pain    Unclear etiology.  Has seen the dentist and oral surgeons.  They informed her that they did not know anything more to do.  Will check esr and cbc.  Doubt temporal arteritis.  Discussed trigeminal neuralgia.  May need neurology evaluation.  Discussed possible scan.  No rash.        Relevant Medications   gabapentin (NEURONTIN) 100 MG capsule   Other Relevant Orders   Sedimentation rate (Completed)   Hypercholesterolemia    On lipitor.  Low cholesterol diet and exercise.  Follow lipid panel and liver function tests.        Relevant Medications   losartan-hydrochlorothiazide (HYZAAR) 100-12.5 MG tablet   Other Relevant Orders   Hepatic function panel (Completed)   Lipid panel (Completed)   Hyperglycemia    Low carb diet and exercise.  Follow met b and a1c.        Relevant Orders   Hemoglobin A1c (Completed)   Hypertension    Blood pressure still elevated.  Do think pain is contributing, but was elevated prior to increased pain.  Increase losartan/hctz to losartan/hctz 100/12.5.  Follow.  Have her spot check her pressure.  Get her back in soon to reassess.        Relevant Medications    losartan-hydrochlorothiazide (HYZAAR) 100-12.5 MG tablet   Other Relevant Orders   CBC with Differential/Platelet (Completed)   TSH (Completed)   Basic metabolic panel (Completed)   Renal insufficiency - Primary    Last creatinine relatively stable at 1.12.  Stay hydrated.  Try to avoid antiinflammatories.  Follow met b.        Spinal stenosis    S/p surgery and has done well.  Followed by Dr Carloyn Manner.           [name], MD

## 2016-07-28 NOTE — Progress Notes (Signed)
Pre visit review using our clinic review tool, if applicable. No additional management support is needed unless otherwise documented below in the visit note. 

## 2016-07-29 ENCOUNTER — Telehealth: Payer: Self-pay | Admitting: *Deleted

## 2016-07-29 ENCOUNTER — Telehealth: Payer: Self-pay

## 2016-07-29 DIAGNOSIS — R51 Headache: Principal | ICD-10-CM

## 2016-07-29 DIAGNOSIS — R519 Headache, unspecified: Secondary | ICD-10-CM

## 2016-07-29 NOTE — Telephone Encounter (Signed)
-----   Message from [name], MD sent at 07/29/2016  5:11 AM EST ----- Please call and notify pt that her cholesterol increased some when compared to previous check.  Continue lipitor and make sure taking daily.  Continue a low cholesterol diet and exercise.  One liver test slightly increased.  Remainder of liver panel wnl.  May be fatty liver.  We will follow.  Overall sugar control, hgb and kidney function tests are stable.  The inflammation test is wnl.  Given persistent head pain and normal inflammation test, I would like to refer her to neurology for further evaluation.  If agreeable, let me know and I will place the order for the referral.

## 2016-07-29 NOTE — Telephone Encounter (Signed)
Patient notified of lab results, she would like to proceed with referral to neurology.

## 2016-07-29 NOTE — Telephone Encounter (Signed)
Pt requested lab results  Pt contact [phone]

## 2016-07-30 NOTE — Telephone Encounter (Signed)
Order placed for neurology referral.   

## 2016-07-31 ENCOUNTER — Other Ambulatory Visit: Payer: Self-pay | Admitting: Cardiovascular Disease

## 2016-07-31 ENCOUNTER — Encounter: Payer: Self-pay | Admitting: Internal Medicine

## 2016-08-01 DIAGNOSIS — G500 Trigeminal neuralgia: Secondary | ICD-10-CM

## 2016-08-01 HISTORY — DX: Trigeminal neuralgia: G50.0

## 2016-08-03 ENCOUNTER — Encounter: Payer: Self-pay | Admitting: Internal Medicine

## 2016-08-03 NOTE — Assessment & Plan Note (Signed)
Unclear etiology.  Has seen the dentist and oral surgeons.  They informed her that they did not know anything more to do.  Will check esr and cbc.  Doubt temporal arteritis.  Discussed trigeminal neuralgia.  May need neurology evaluation.  Discussed possible scan.  No rash.

## 2016-08-03 NOTE — Assessment & Plan Note (Signed)
Last creatinine relatively stable at 1.12.  Stay hydrated.  Try to avoid antiinflammatories.  Follow met b.

## 2016-08-03 NOTE — Assessment & Plan Note (Signed)
Followed by cardiology.  Doing well on current regimen.   

## 2016-08-03 NOTE — Assessment & Plan Note (Signed)
S/p surgery and has done well.  Followed by Dr Carloyn Manner.

## 2016-08-03 NOTE — Assessment & Plan Note (Signed)
Low carb diet and exercise.  Follow met b and a1c.   

## 2016-08-03 NOTE — Assessment & Plan Note (Signed)
Blood pressure still elevated.  Do think pain is contributing, but was elevated prior to increased pain.  Increase losartan/hctz to losartan/hctz 100/12.5.  Follow.  Have her spot check her pressure.  Get her back in soon to reassess.

## 2016-08-03 NOTE — Assessment & Plan Note (Signed)
On lipitor.  Low cholesterol diet and exercise.  Follow lipid panel and liver function tests.   

## 2016-08-05 ENCOUNTER — Telehealth: Payer: Self-pay

## 2016-08-05 NOTE — Telephone Encounter (Signed)
Received cardiac clearance request for pt to proceed w/ elective colonoscopy on 08/15/16 w/ Dr. Gustavo Lah. She will be receiving monitored anesthesia for sedation. They also request recommendations on holding pt's Eliquis prior to procedure. Please route clearance & instructions to Ronda/Tabitha @ [facility] @ [phone].

## 2016-08-06 DIAGNOSIS — Z6841 Body Mass Index (BMI) 40.0 and over, adult: Secondary | ICD-10-CM | POA: Diagnosis not present

## 2016-08-06 DIAGNOSIS — G500 Trigeminal neuralgia: Secondary | ICD-10-CM | POA: Diagnosis not present

## 2016-08-06 DIAGNOSIS — I4891 Unspecified atrial fibrillation: Secondary | ICD-10-CM | POA: Diagnosis not present

## 2016-08-06 DIAGNOSIS — E6609 Other obesity due to excess calories: Secondary | ICD-10-CM | POA: Diagnosis not present

## 2016-08-06 NOTE — Telephone Encounter (Signed)
Cardiac clearance routed through Epic to Dr. Marton Redwood office.

## 2016-08-06 NOTE — Telephone Encounter (Signed)
Acceptable risk for surgery Would hold anticoagulation 2 days prior to procedure No further testing needed

## 2016-08-07 ENCOUNTER — Telehealth: Payer: Self-pay | Admitting: Cardiovascular Disease

## 2016-08-07 DIAGNOSIS — IMO0001 Reserved for inherently not codable concepts without codable children: Secondary | ICD-10-CM | POA: Insufficient documentation

## 2016-08-07 NOTE — Telephone Encounter (Signed)
Received message from front desk this am that Lisabeth Register, NP w/ [facility] was trying to get ahold of Dr. Rockey Situ regarding pt. [facility] paged Dr. Rockey Situ yesterday, but when he tried to call back, he was on hold for some time.  I left message at [phone] at 12:37 today, she was at lunch. Provided my back line # for her to call to speak w/ Dr. Rockey Situ.  Called pt to find out what was going on, as we have not heard back from their office. Pt states that she saw Dr. Melrose Nakayama yesterday and was prescribed Tegretol, but pt has not started this yet b/c they wanted to speak w/ Dr. Rockey Situ.  Dr. Rockey Situ advised pt to call her pharmacy and see if there are any interactions w/ her meds. She verbalizes understanding and will try to reach their office, b/c she is awaiting a call back from them, as well.  Cory from [facility] called and advised that Lisabeth Register, NP would like to speak w/ Dr. Rockey Situ regarding pt's meds, potential interactions/appropriate dosing of amiodarone & tegretol. Please call Benjamine Mola on her cell phone @ [phone].

## 2016-08-07 NOTE — Telephone Encounter (Signed)
Spoke w/ pt's pharmacist.   He states that he is not concerned about interactions b/t pt's amiodarone & Tegretol.  He is more concerned w/ Eliquis & Tegretol, as the Tegretol decreases the effectiveness of Eliquis & increases her risk of strength.   He states that pt's gabapentin dose is "woefully under dosed" and that if pt is being started on Tegretol for facial nerve issue, it would be more effective to increase gabapentin up to 2700 mg daily or switch to Lyrica.

## 2016-08-15 ENCOUNTER — Ambulatory Visit
Admission: RE | Admit: 2016-08-15 | Discharge: 2016-08-15 | Disposition: A | Discharge status: Home / Self Care | Payer: BLUE CROSS/BLUE SHIELD | Source: Ambulatory Visit | Attending: Gastroenterology | Admitting: Gastroenterology

## 2016-08-15 ENCOUNTER — Encounter: Payer: Self-pay | Admitting: Anesthesiology

## 2016-08-15 ENCOUNTER — Ambulatory Visit: Payer: BLUE CROSS/BLUE SHIELD | Admitting: Anesthesiology

## 2016-08-15 ENCOUNTER — Encounter
Admission: RE | Disposition: A | Discharge status: Home / Self Care | Payer: Self-pay | Source: Ambulatory Visit | Attending: Gastroenterology

## 2016-08-15 DIAGNOSIS — K5730 Diverticulosis of large intestine without perforation or abscess without bleeding: Secondary | ICD-10-CM | POA: Diagnosis not present

## 2016-08-15 DIAGNOSIS — I10 Essential (primary) hypertension: Secondary | ICD-10-CM | POA: Diagnosis not present

## 2016-08-15 DIAGNOSIS — M48061 Spinal stenosis, lumbar region without neurogenic claudication: Secondary | ICD-10-CM | POA: Insufficient documentation

## 2016-08-15 DIAGNOSIS — I739 Peripheral vascular disease, unspecified: Secondary | ICD-10-CM | POA: Insufficient documentation

## 2016-08-15 DIAGNOSIS — D128 Benign neoplasm of rectum: Secondary | ICD-10-CM | POA: Diagnosis not present

## 2016-08-15 DIAGNOSIS — M1990 Unspecified osteoarthritis, unspecified site: Secondary | ICD-10-CM | POA: Insufficient documentation

## 2016-08-15 DIAGNOSIS — Z1211 Encounter for screening for malignant neoplasm of colon: Secondary | ICD-10-CM | POA: Insufficient documentation

## 2016-08-15 DIAGNOSIS — Z955 Presence of coronary angioplasty implant and graft: Secondary | ICD-10-CM | POA: Diagnosis not present

## 2016-08-15 DIAGNOSIS — E7800 Pure hypercholesterolemia, unspecified: Secondary | ICD-10-CM | POA: Diagnosis not present

## 2016-08-15 DIAGNOSIS — K5790 Diverticulosis of intestine, part unspecified, without perforation or abscess without bleeding: Secondary | ICD-10-CM | POA: Diagnosis not present

## 2016-08-15 DIAGNOSIS — Z79899 Other long term (current) drug therapy: Secondary | ICD-10-CM | POA: Insufficient documentation

## 2016-08-15 DIAGNOSIS — K635 Polyp of colon: Secondary | ICD-10-CM | POA: Diagnosis not present

## 2016-08-15 DIAGNOSIS — Z86010 Personal history of colonic polyps: Secondary | ICD-10-CM | POA: Diagnosis not present

## 2016-08-15 DIAGNOSIS — K621 Rectal polyp: Secondary | ICD-10-CM | POA: Diagnosis not present

## 2016-08-15 HISTORY — PX: COLONOSCOPY WITH PROPOFOL: SHX5780

## 2016-08-15 LAB — HM COLONOSCOPY

## 2016-08-15 SURGERY — COLONOSCOPY WITH PROPOFOL
Anesthesia: General

## 2016-08-15 MED ORDER — MIDAZOLAM HCL 2 MG/2ML IJ SOLN
INTRAMUSCULAR | Status: DC | PRN
  Administered 2016-08-15: 1 mg via INTRAVENOUS

## 2016-08-15 MED ORDER — PROPOFOL 500 MG/50ML IV EMUL
INTRAVENOUS | Status: DC | PRN
  Administered 2016-08-15: 150 ug/kg/min via INTRAVENOUS

## 2016-08-15 MED ORDER — PROPOFOL 10 MG/ML IV BOLUS
INTRAVENOUS | Status: DC | PRN
  Administered 2016-08-15: 20 mg via INTRAVENOUS
  Administered 2016-08-15: 60 mg via INTRAVENOUS

## 2016-08-15 MED ORDER — SODIUM CHLORIDE 0.9 % IV SOLN
INTRAVENOUS | Status: DC
  Administered 2016-08-15: 09:00:00 via INTRAVENOUS

## 2016-08-15 NOTE — Transfer of Care (Signed)
Immediate Anesthesia Transfer of Care Note  Patient: Kristin James  Procedure(s) Performed: Procedure(s): COLONOSCOPY WITH PROPOFOL (N/A)  Patient Location: PACU  Anesthesia Type:General  Level of Consciousness: sedated  Airway & Oxygen Therapy: Patient Spontanous Breathing and Patient connected to nasal cannula oxygen  Post-op Assessment: Report given to RN and Post -op Vital signs reviewed and stable  Post vital signs: Reviewed and stable  Last Vitals:  Vitals:   08/15/16 0859 08/15/16 1131  BP: (!) 161/70 (!) 106/56  Pulse: (!) 52 (!) 51  Resp: 17 (!) 6  Temp: 36.7 C (!) 35.7 C    Last Pain:  Vitals:   08/15/16 1131  TempSrc: Tympanic         Complications: No apparent anesthesia complications

## 2016-08-15 NOTE — Op Note (Signed)
[facility] Gastroenterology Patient Name: Kristin James Procedure Date: 08/15/2016 10:42 AM MRN: [id number] Account #: [id number] Date of Birth: 04-09-1956 Admit Type: Outpatient Age: 60 Room: [facility] ENDO ROOM 3 Gender: Female Note Status: Finalized Procedure:            Colonoscopy Indications:          Personal history of colonic polyps Providers:            Lollie Sails, MD Referring MD:         [name], MD (Referring MD) Medicines:            Monitored Anesthesia Care Complications:        No immediate complications. Procedure:            Pre-Anesthesia Assessment:                       - ASA Grade Assessment: III - A patient with severe                        systemic disease.                       After obtaining informed consent, the colonoscope was                        passed under direct vision. Throughout the procedure,                        the patient's blood pressure, pulse, and oxygen                        saturations were monitored continuously. The                        Colonoscope was introduced through the anus and                        advanced to the the cecum, identified by appendiceal                        orifice and ileocecal valve. The colonoscopy was                        unusually difficult due to poor bowel prep. Successful                        completion of the procedure was aided by applying                        abdominal pressure and lavage. The patient tolerated                        the procedure well. The quality of the bowel                        preparation was fair. Findings:      Multiple small-mouthed diverticula were found in the sigmoid colon and       descending colon.      Three sessile polyps were found in the rectum. The polyps were 1 to 3 mm  in size. These polyps were removed with a cold biopsy forceps. Resection       and retrieval were complete.      The retroflexed view of the  distal rectum and anal verge was normal and       showed no anal or rectal abnormalities. Impression:           - Preparation of the colon was fair.                       - Diverticulosis in the sigmoid colon and in the                        descending colon.                       - Three 1 to 3 mm polyps in the rectum, removed with a                        cold biopsy forceps. Resected and retrieved.                       - The distal rectum and anal verge are normal on                        retroflexion view. Recommendation:       - Await pathology results. Procedure Code(s):    --- Professional ---                       [phone], Colonoscopy, flexible; with biopsy, single or                        multiple Diagnosis Code(s):    --- Professional ---                       K62.1, Rectal polyp                       Z86.010, Personal history of colonic polyps                       K57.30, Diverticulosis of large intestine without                        perforation or abscess without bleeding CPT copyright 2016 American Medical Association. All rights reserved. The codes documented in this report are preliminary and upon coder review may  be revised to meet current compliance requirements. Lollie Sails, MD 08/15/2016 11:31:36 AM This report has been signed electronically. Number of Addenda: 0 Note Initiated On: 08/15/2016 10:42 AM Scope Withdrawal Time: 0 hours 13 minutes 19 seconds  Total Procedure Duration: 0 hours 33 minutes 21 seconds       [facility]

## 2016-08-15 NOTE — H&P (Addendum)
Outpatient short stay form Pre-procedure 08/15/2016 10:46 AM Lollie Sails MD  Primary Physician: Dr. Ronette Deter  Reason for visit:  Colonoscopy  History of present illness:  Patient is a 60 year old female with a personal history of adenomatous colon polyps. She does take L) held that for between 2-3 days. She takes no other blood thinning agents or aspirin product. She tolerated her prep well.    Current Facility-Administered Medications:  .  0.9 %  sodium chloride infusion, , Intravenous, Continuous, Lollie Sails, MD, Last Rate: 20 mL/hr at 08/15/16 [id number]  Prescriptions Prior to Admission  Medication Sig Dispense Refill Last Dose  . amiodarone (PACERONE) 200 MG tablet take 1 tablet by mouth once daily 90 tablet 3 08/15/2016 at 0730  . atenolol (TENORMIN) 25 MG tablet take 1 tablet by mouth once daily 90 tablet 3 08/15/2016 at 0730  . atorvastatin (LIPITOR) 40 MG tablet take 1 tablet by mouth once daily 90 tablet 3 08/15/2016 at 0730  . diltiazem (CARDIZEM CD) 120 MG 24 hr capsule take 1 capsule by mouth once daily 90 capsule 3 08/15/2016 at 0730  . gabapentin (NEURONTIN) 100 MG capsule Take 1 capsule (100 mg total) by mouth 3 (three) times daily. 90 capsule 1 08/15/2016 at 0730  . losartan-hydrochlorothiazide (HYZAAR) 100-12.5 MG tablet Take 1 tablet by mouth daily. 30 tablet 1 08/15/2016 at 0730  . meloxicam (MOBIC) 7.5 MG tablet Take 7.5 mg by mouth daily.   0 08/15/2016 at 0730  . apixaban (ELIQUIS) 5 MG TABS tablet Take 1 tablet (5 mg total) by mouth 2 (two) times daily. 180 tablet 3 Taking     No Known Allergies   Past Medical History:  Diagnosis Date  . Embolus to lower extremity ([place])   . Hiatal hernia   . Hypercholesteremia   . Hyperglycemia   . Hypertension   . Osteoarthritis   . Peripheral arterial disease (HCC)    embolic disease   . Proteinuria   . Spinal stenosis at L4-L5 level     Review of systems:      Physical Exam    Heart and lungs:  Regular rate and rhythm without rub or gallop, lungs are bilaterally clear.    HEENT: Normocephalic atraumatic eyes are anicteric    Other:     Pertinant exam for procedure: Soft nontender nondistended bowel sounds positive normoactive.    Planned proceedures: Colonoscopy and indicated procedures. I have discussed the risks benefits and complications of procedures to include not limited to bleeding, infection, perforation and the risk of sedation and the patient wishes to proceed.    Lollie Sails, MD Gastroenterology 08/15/2016  10:46 AM

## 2016-08-15 NOTE — Anesthesia Postprocedure Evaluation (Signed)
Anesthesia Post Note  Patient: Kristin James  Procedure(s) Performed: Procedure(s) (LRB): COLONOSCOPY WITH PROPOFOL (N/A)  Patient location during evaluation: Endoscopy Anesthesia Type: General Level of consciousness: awake and alert Pain management: pain level controlled Vital Signs Assessment: post-procedure vital signs reviewed and stable Respiratory status: spontaneous breathing, nonlabored ventilation, respiratory function stable and patient connected to nasal cannula oxygen Cardiovascular status: blood pressure returned to baseline and stable Postop Assessment: no signs of nausea or vomiting Anesthetic complications: no    Last Vitals:  Vitals:   08/15/16 1150 08/15/16 1200  BP: 131/67 137/71  Pulse: (!) 55 (!) 51  Resp: 19 15  Temp:      Last Pain:  Vitals:   08/15/16 1131  TempSrc: Tympanic                 Martha Clan

## 2016-08-15 NOTE — Anesthesia Preprocedure Evaluation (Signed)
Anesthesia Evaluation  Patient identified by MRN, date of birth, ID band Patient awake    Reviewed: Allergy & Precautions, H&P , NPO status , Patient's Chart, lab work & pertinent test results, reviewed documented beta blocker date and time   History of Anesthesia Complications Negative for: history of anesthetic complications  Airway Mallampati: II  TM Distance: >3 FB Neck ROM: full    Dental no notable dental hx. (+) Caps, Missing   Pulmonary neg pulmonary ROS,    Pulmonary exam normal breath sounds clear to auscultation       Cardiovascular Exercise Tolerance: Good hypertension, (-) angina+ Peripheral Vascular Disease  (-) CAD, (-) Past MI, (-) Cardiac Stents and (-) CABG Normal cardiovascular exam+ dysrhythmias Atrial Fibrillation (-) Valvular Problems/Murmurs Rhythm:regular Rate:Normal     Neuro/Psych neg Seizures  Neuromuscular disease negative psych ROS   GI/Hepatic Neg liver ROS, hiatal hernia,   Endo/Other  neg diabetesMorbid obesity  Renal/GU Renal disease  negative genitourinary   Musculoskeletal   Abdominal   Peds  Hematology negative hematology ROS (+)   Anesthesia Other Findings Past Medical History: No date: Embolus to lower extremity (HCC) No date: Hiatal hernia No date: Hypercholesteremia No date: Hyperglycemia No date: Hypertension No date: Osteoarthritis No date: Peripheral arterial disease (HCC)     Comment: embolic disease  No date: Proteinuria No date: Spinal stenosis at L4-L5 level   Reproductive/Obstetrics negative OB ROS                             Anesthesia Physical Anesthesia Plan  ASA: II  Anesthesia Plan: General   Post-op Pain Management:    Induction:   Airway Management Planned:   Additional Equipment:   Intra-op Plan:   Post-operative Plan:   Informed Consent: I have reviewed the patients History and Physical, chart, labs and  discussed the procedure including the risks, benefits and alternatives for the proposed anesthesia with the patient or authorized representative who has indicated his/her understanding and acceptance.   Dental Advisory Given  Plan Discussed with: Anesthesiologist, CRNA and Surgeon  Anesthesia Plan Comments:         Anesthesia Quick Evaluation

## 2016-08-16 ENCOUNTER — Encounter: Payer: Self-pay | Admitting: Gastroenterology

## 2016-08-18 LAB — SURGICAL PATHOLOGY

## 2016-08-19 DIAGNOSIS — G500 Trigeminal neuralgia: Secondary | ICD-10-CM | POA: Diagnosis not present

## 2016-08-19 DIAGNOSIS — I4891 Unspecified atrial fibrillation: Secondary | ICD-10-CM | POA: Diagnosis not present

## 2016-08-19 DIAGNOSIS — Z6841 Body Mass Index (BMI) 40.0 and over, adult: Secondary | ICD-10-CM | POA: Diagnosis not present

## 2016-08-19 DIAGNOSIS — E6609 Other obesity due to excess calories: Secondary | ICD-10-CM | POA: Diagnosis not present

## 2016-09-07 ENCOUNTER — Other Ambulatory Visit: Payer: Self-pay | Admitting: Cardiovascular Disease

## 2016-09-08 NOTE — Telephone Encounter (Signed)
Lmov for patient to call back and schedule appointment for refills.

## 2016-09-08 NOTE — Telephone Encounter (Signed)
Pt needs a f/u visit with Dr. Rockey Situ. Thanks!

## 2016-09-10 DIAGNOSIS — G500 Trigeminal neuralgia: Secondary | ICD-10-CM | POA: Diagnosis not present

## 2016-09-14 ENCOUNTER — Other Ambulatory Visit: Payer: Self-pay | Admitting: Internal Medicine

## 2016-09-17 DIAGNOSIS — G500 Trigeminal neuralgia: Secondary | ICD-10-CM | POA: Diagnosis not present

## 2016-09-19 ENCOUNTER — Encounter: Payer: Self-pay | Admitting: Cardiovascular Disease

## 2016-09-19 ENCOUNTER — Ambulatory Visit (INDEPENDENT_AMBULATORY_CARE_PROVIDER_SITE_OTHER): Payer: BLUE CROSS/BLUE SHIELD | Admitting: Cardiovascular Disease

## 2016-09-19 VITALS — BP 138/84 | HR 53 | Ht 60.0 in | Wt 209.8 lb

## 2016-09-19 DIAGNOSIS — I4891 Unspecified atrial fibrillation: Secondary | ICD-10-CM | POA: Diagnosis not present

## 2016-09-19 DIAGNOSIS — I10 Essential (primary) hypertension: Secondary | ICD-10-CM

## 2016-09-19 DIAGNOSIS — E7800 Pure hypercholesterolemia, unspecified: Secondary | ICD-10-CM | POA: Diagnosis not present

## 2016-09-19 DIAGNOSIS — G500 Trigeminal neuralgia: Secondary | ICD-10-CM

## 2016-09-19 NOTE — Patient Instructions (Signed)
Medication Instructions:   Please move the diltiazem to nighttime  Stop eliquis 2 days before surgery The morning of the procedure, Hold your losartan HCTZ  Restart eliquis no earlier than 24 hours after the procedure Consider 48 hours after to restart  Labwork:  No new labs needed  Testing/Procedures:  No further testing at this time   I recommend watching educational videos on topics of interest to you at:       [email or web address]  Enter code: HEARTCARE    Follow-Up: It was a pleasure seeing you in the office today. Please call us if you have new issues that need to be addressed before your next appt.  [phone]  Your physician wants you to follow-up in: 6 months.  You will receive a reminder letter in the mail two months in advance. If you don't receive a letter, please call our office to schedule the follow-up appointment.  If you need a refill on your cardiac medications before your next appointment, please call your pharmacy.

## 2016-09-19 NOTE — Progress Notes (Signed)
Cardiology Office Note  Date:  09/19/2016   ID:  Kristin James, DOB [birth year], MRN [id number]  PCP:  [name], MD   Chief Complaint  Patient presents with  . other    79mo f/u. Pt states she has new dx of trigeminal neuralgia which causes her trouble speaking. Reviewed meds with pt verbally.    HPI:  Kristin James is a pleasant 61 year old woman with history of paroxysmal atrial fibrillation, HTN, chronic back pain, spinal stenosis, obesity,  admission to the hospital in September 2015 for acute left lower extremity arterial ischemia secondary to thrombus. She presents for routine followup of her atrial fibrillation  In follow-up today she is not doing well, she has been suffering from Trigeminal neuralgia x 6 months. She has seen several specialists, Tried numerous nerve medications Including prednisone, Neurontin, trileptal among others Continues to have Severe pain left side of face, shocking pain,  Thinking about surgery, for decompression of the nerve Surgery would be done at [facility] Scheduled for MRI to rule out other entities such as tumor  Denies having tachycardia or palpitations concerning for paroxysmal atrial fibrillation Asymptomatic from her bradycardia Reports blood pressure has been relatively well-controlled No complications from her anticoagulation Denies any chest pain or shortness of breath concerning for angina He  EKG on today's visit shows sinus bradycardia with rate 53 bpm, no significant ST or T-wave changes  Other past medical history back surgery March 2016, lumbar region Prior office visit, EKG documented atrial fibrillation. Started on amiodarone, diltiazem, beta blocker and she converted to normal sinus rhythm. Atenolol dose decreased secondary to bradycardia  admitted to the hospital on May 21 2014 with acute pain in her left leg. She had no distal pulses. Her creatinine was 1.99 which improved to 1.0 with fluids. She underwent  angiogram of the left lower extremity with thrombolysis with TPA of the left pill, tibial peroneal trunk, peroneal arteries and distally, thrombectomy of the vessel with restoration of flow. she does not have any sleep apnea, does have occasional snoring  Stress test was done in the hospital for chest pain. This showed no ischemia. Ejection fraction 67% EKG in the hospital showed normal sinus rhythm with rate 74 beats per minute. 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  PSH:    Past Surgical History:  Procedure Laterality Date  . AORTOGRAM    . BACK SURGERY  11/13/2014  . CHOLECYSTECTOMY    . COLONOSCOPY WITH PROPOFOL N/A 08/15/2016   Procedure: COLONOSCOPY WITH PROPOFOL;  Surgeon: Lollie Sails, MD;  Location: [facility] ENDOSCOPY;  Service: Endoscopy;  Laterality: N/A;  . KNEE ARTHROSCOPY    . LOWER EXTREMITY ANGIOGRAM      Current Outpatient Prescriptions  Medication Sig Dispense Refill  . amiodarone (PACERONE) 200 MG tablet take 1 tablet by mouth once daily 90 tablet 3  . apixaban (ELIQUIS) 5 MG TABS tablet Take 1 tablet (5 mg total) by mouth 2 (two) times daily. 180 tablet 3  . atenolol (TENORMIN) 25 MG tablet take 1 tablet by mouth once daily 90 tablet 3  . atorvastatin (LIPITOR) 40 MG tablet take 1 tablet by mouth once daily 90 tablet 0  . CARTIA XT 120 MG 24 hr capsule take 1 capsule by mouth once daily 90 capsule 0  . gabapentin (NEURONTIN)  100 MG capsule Take 1 capsule (100 mg total) by mouth 3 (three) times daily. 90 capsule 1  . losartan-hydrochlorothiazide (HYZAAR) 100-12.5 MG tablet take 1 tablet by mouth once daily 30 tablet 1  . meloxicam (MOBIC) 7.5 MG tablet Take 7.5 mg by mouth daily.   0  . Oxcarbazepine (TRILEPTAL) 300  MG tablet Take 300 mg by mouth 2 (two) times daily.     No current facility-administered medications for this visit.      Allergies:   Patient has no known allergies.   Social History:  The patient  reports that she has never smoked. She has never used smokeless tobacco. She reports that she does not drink alcohol or use drugs.   Family History:   family history includes Breast cancer in her paternal grandmother; Hypertension in her father and mother; Stroke in her father.    Review of Systems: Review of Systems  Constitutional: Negative.   HENT:       Severe left-sided facial pain  Respiratory: Negative.   Cardiovascular: Negative.   Gastrointestinal: Negative.   Musculoskeletal: Negative.   Neurological: Negative.   Psychiatric/Behavioral: Negative.   All other systems reviewed and are negative.    PHYSICAL EXAM: VS:  BP 138/84 (BP Location: Left Arm, Patient Position: Sitting, Cuff Size: Large)   Pulse (!) 53   Ht 5' (1.524 m)   Wt 209 lb 12 oz (95.1 kg)   BMI 40.96 kg/m  , BMI Body mass index is 40.96 kg/m. GEN: Well nourished, well developed, in no acute distress , obese HEENT: normal  Neck: no JVD, carotid bruits, or masses Cardiac: RRR; no murmurs, rubs, or gallops,no edema  Respiratory:  clear to auscultation bilaterally, normal work of breathing GI: soft, nontender, nondistended, + BS MS: no deformity or atrophy  Skin: warm and dry, no rash Neuro:  Strength and sensation are intact Psych: euthymic mood, full affect    Recent Labs: 07/28/2016: ALT 41; BUN 23; Creatinine, Ser 1.13; Hemoglobin 16.3; Platelets 235.0; Potassium 4.9; Sodium 142; TSH 1.56    Lipid Panel Lab Results  Component Value Date   CHOL 180 07/28/2016   HDL 51.70 07/28/2016   LDLCALC 111 (H) 07/28/2016   TRIG 85.0 07/28/2016      Wt Readings from Last 3 Encounters:  09/19/16 209 lb 12 oz (95.1 kg)  08/15/16 210 lb (95.3 kg)  07/28/16 208 lb 12.8 oz (94.7 kg)        ASSESSMENT AND PLAN:  Essential hypertension Blood pressure is well controlled on today's visit. No changes made to the medications.  Hypercholesterolemia Encouraged her to stay on her Lipitor  Morbid obesity ([place]) Unable to exercise at this time secondary to severe pain in her left face, jaw Recommended low carbohydrate diet  Atrial fibrillation with RVR ([place]) - Plan: EKG 12-Lead Sinus bradycardia on today's visit, denies any symptoms concerning for arrhythmia Recommended she move Cardizem to the evening Continue amiodarone and atenolol in the morning  Trigeminal neuralgia of left side of face Long discussion concerning recent events and workup for trigeminal neuralgia . Severe debilitating symptoms for the past 6 months . Acceptable risk if surgery is needed for nerve decompression at [facility]. No further testing needed. Recommended she hold anticoagulation 2 days prior to procedure, would restart no sooner than 24 hours after surgery. Would defer restart to surgeon   Total encounter time more than 25 minutes  Greater than 50% was spent in counseling and coordination of care with the  patient   Disposition:   F/U  6 months   Orders Placed This Encounter  Procedures  . EKG 12-Lead     Signed, Esmond Plants, M.D., Ph.D. 09/19/2016  [place], [place]

## 2016-10-01 DIAGNOSIS — G500 Trigeminal neuralgia: Secondary | ICD-10-CM | POA: Diagnosis not present

## 2016-10-01 DIAGNOSIS — D329 Benign neoplasm of meninges, unspecified: Secondary | ICD-10-CM | POA: Diagnosis not present

## 2016-11-05 ENCOUNTER — Other Ambulatory Visit: Payer: Self-pay | Admitting: Internal Medicine

## 2016-11-17 DIAGNOSIS — D320 Benign neoplasm of cerebral meninges: Secondary | ICD-10-CM | POA: Diagnosis not present

## 2016-11-17 DIAGNOSIS — Z510 Encounter for antineoplastic radiation therapy: Secondary | ICD-10-CM | POA: Diagnosis not present

## 2016-11-17 DIAGNOSIS — D329 Benign neoplasm of meninges, unspecified: Secondary | ICD-10-CM | POA: Diagnosis not present

## 2016-11-17 DIAGNOSIS — D333 Benign neoplasm of cranial nerves: Secondary | ICD-10-CM | POA: Diagnosis not present

## 2016-11-18 DIAGNOSIS — Z510 Encounter for antineoplastic radiation therapy: Secondary | ICD-10-CM | POA: Diagnosis not present

## 2016-11-18 DIAGNOSIS — D320 Benign neoplasm of cerebral meninges: Secondary | ICD-10-CM | POA: Diagnosis not present

## 2016-11-18 DIAGNOSIS — G500 Trigeminal neuralgia: Secondary | ICD-10-CM | POA: Diagnosis not present

## 2016-11-19 DIAGNOSIS — D320 Benign neoplasm of cerebral meninges: Secondary | ICD-10-CM | POA: Diagnosis not present

## 2016-11-19 DIAGNOSIS — Z510 Encounter for antineoplastic radiation therapy: Secondary | ICD-10-CM | POA: Diagnosis not present

## 2016-11-20 DIAGNOSIS — D320 Benign neoplasm of cerebral meninges: Secondary | ICD-10-CM | POA: Diagnosis not present

## 2016-11-20 DIAGNOSIS — Z510 Encounter for antineoplastic radiation therapy: Secondary | ICD-10-CM | POA: Diagnosis not present

## 2016-11-21 DIAGNOSIS — Z510 Encounter for antineoplastic radiation therapy: Secondary | ICD-10-CM | POA: Diagnosis not present

## 2016-11-21 DIAGNOSIS — D320 Benign neoplasm of cerebral meninges: Secondary | ICD-10-CM | POA: Diagnosis not present

## 2016-12-17 DIAGNOSIS — G500 Trigeminal neuralgia: Secondary | ICD-10-CM | POA: Diagnosis not present

## 2016-12-26 ENCOUNTER — Other Ambulatory Visit: Payer: Self-pay | Admitting: Cardiovascular Disease

## 2017-01-01 ENCOUNTER — Other Ambulatory Visit: Payer: Self-pay | Admitting: Cardiovascular Disease

## 2017-01-02 ENCOUNTER — Other Ambulatory Visit: Payer: Self-pay | Admitting: Internal Medicine

## 2017-02-13 ENCOUNTER — Other Ambulatory Visit: Payer: Self-pay | Admitting: Cardiovascular Disease

## 2017-02-26 ENCOUNTER — Encounter: Payer: Self-pay | Admitting: Internal Medicine

## 2017-02-26 ENCOUNTER — Ambulatory Visit (INDEPENDENT_AMBULATORY_CARE_PROVIDER_SITE_OTHER): Payer: BLUE CROSS/BLUE SHIELD | Admitting: Internal Medicine

## 2017-02-26 DIAGNOSIS — E7800 Pure hypercholesterolemia, unspecified: Secondary | ICD-10-CM | POA: Diagnosis not present

## 2017-02-26 DIAGNOSIS — Z6841 Body Mass Index (BMI) 40.0 and over, adult: Secondary | ICD-10-CM

## 2017-02-26 DIAGNOSIS — R739 Hyperglycemia, unspecified: Secondary | ICD-10-CM

## 2017-02-26 DIAGNOSIS — D329 Benign neoplasm of meninges, unspecified: Secondary | ICD-10-CM

## 2017-02-26 DIAGNOSIS — M4800 Spinal stenosis, site unspecified: Secondary | ICD-10-CM

## 2017-02-26 DIAGNOSIS — I4891 Unspecified atrial fibrillation: Secondary | ICD-10-CM | POA: Diagnosis not present

## 2017-02-26 DIAGNOSIS — I10 Essential (primary) hypertension: Secondary | ICD-10-CM

## 2017-02-26 DIAGNOSIS — G500 Trigeminal neuralgia: Secondary | ICD-10-CM

## 2017-02-26 LAB — LIPID PANEL
CHOLESTEROL: 157 mg/dL (ref 0–200)
HDL: 47.5 mg/dL (ref >39.00)
LDL CALC: 92 mg/dL (ref 0–99)
NonHDL: 109.34
Total CHOL/HDL Ratio: 3
Triglycerides: 86 mg/dL (ref 0.0–149.0)
VLDL: 17.2 mg/dL (ref 0.0–40.0)

## 2017-02-26 LAB — BASIC METABOLIC PANEL
BUN: 18 mg/dL (ref 6–23)
CALCIUM: 9.2 mg/dL (ref 8.4–10.5)
CO2: 29 mEq/L (ref 19–32)
Chloride: 103 mEq/L (ref 96–112)
Creatinine, Ser: 0.95 mg/dL (ref 0.40–1.20)
GFR: 63.43 mL/min (ref >60.00)
GLUCOSE: 106 mg/dL — AB (ref 70–99)
Potassium: 4 mEq/L (ref 3.5–5.1)
Sodium: 139 mEq/L (ref 135–145)

## 2017-02-26 LAB — CBC WITH DIFFERENTIAL/PLATELET
BASOS ABS: 0.1 10*3/uL (ref 0.0–0.1)
BASOS PCT: 1.4 % (ref 0.0–3.0)
EOS ABS: 0.1 10*3/uL (ref 0.0–0.7)
Eosinophils Relative: 1.9 % (ref 0.0–5.0)
HCT: 43.7 % (ref 36.0–46.0)
HEMOGLOBIN: 15 g/dL (ref 12.0–15.0)
Lymphocytes Relative: 25.8 % (ref 12.0–46.0)
Lymphs Abs: 1.6 10*3/uL (ref 0.7–4.0)
MCHC: 34.3 g/dL (ref 30.0–36.0)
MCV: 91.4 fl (ref 78.0–100.0)
MONO ABS: 0.4 10*3/uL (ref 0.1–1.0)
Monocytes Relative: 7.3 % (ref 3.0–12.0)
Neutro Abs: 3.9 10*3/uL (ref 1.4–7.7)
Neutrophils Relative %: 63.6 % (ref 43.0–77.0)
PLATELETS: 196 10*3/uL (ref 150.0–400.0)
RBC: 4.78 Mil/uL (ref 3.87–5.11)
RDW: 13.4 % (ref 11.5–15.5)
WBC: 6.1 10*3/uL (ref 4.0–10.5)

## 2017-02-26 LAB — HEPATIC FUNCTION PANEL
ALT: 30 U/L (ref 0–35)
AST: 26 U/L (ref 0–37)
Albumin: 4 g/dL (ref 3.5–5.2)
Alkaline Phosphatase: 107 U/L (ref 39–117)
Bilirubin, Direct: 0.1 mg/dL (ref 0.0–0.3)
Total Bilirubin: 0.7 mg/dL (ref 0.2–1.2)
Total Protein: 6.5 g/dL (ref 6.0–8.3)

## 2017-02-26 LAB — HEMOGLOBIN A1C: HEMOGLOBIN A1C: 6 % (ref 4.6–6.5)

## 2017-02-26 NOTE — Progress Notes (Signed)
Pre-visit discussion using our clinic review tool. No additional management support is needed unless otherwise documented below in the visit note.  

## 2017-02-26 NOTE — Progress Notes (Signed)
Patient ID: Kristin James, female   DOB: 1956/02/27, 61 y.o.   MRN: [id number]   Subjective:    Patient ID: Kristin James, female    DOB: October 21, 1955, 61 y.o.   MRN: [id number]  HPI  Patient here for a scheduled follow up.  She is accompanied by her husband.  History obtained from both of them.  She has been having increased left head pain.  Was initially diagnosed with trigeminal neuralgia.  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  She received gamma knife treatments.  Seeing Dr Vallarie Mare.  On trileptal and gabapentin.  Still with increased pain.  Hurts to talk.  Blood pressure varies with pain.  Brings in blood pressure readings from outside checks.  Reviewed.  Blood pressures averaging 120-150s/70-80s.  No chest pain.  No sob.  Sees Dr Rockey Situ for her afib.  Has been controlled.  Able to eat soft food.  No nausea.  No vomiting.  Bowels moving.     Past Medical History:  Diagnosis Date  . Embolus to lower extremity ([place])   . Hiatal hernia   . Hypercholesteremia   . Hyperglycemia   . Hypertension   . Osteoarthritis   . Peripheral arterial disease (HCC)    embolic disease   . Proteinuria   . Spinal stenosis at L4-L5 level   . Trigeminal neuralgia 08/2016   Past Surgical History:  Procedure Laterality Date  . AORTOGRAM    . BACK SURGERY  11/13/2014  . CHOLECYSTECTOMY    . COLONOSCOPY WITH PROPOFOL N/A 08/15/2016   Procedure: COLONOSCOPY WITH PROPOFOL;  Surgeon: Lollie Sails, MD;  Location: [facility] ENDOSCOPY;  Service: Endoscopy;  Laterality: N/A;  . KNEE ARTHROSCOPY    . LOWER EXTREMITY ANGIOGRAM     Family History  Problem Relation Age of Onset  . Hypertension Mother   . Stroke Father        3 strokes  . Hypertension Father   . Breast cancer Paternal Grandmother   . Colon cancer Neg Hx    Social History   Social History  . Marital status: Married    Spouse name: N/A  . Number of children: 2  . Years of education: N/A   Social History Main Topics  .  Smoking status: Never Smoker  . Smokeless tobacco: Never Used  . Alcohol use No  . Drug use: No  . Sexual activity: Not Asked   Other Topics Concern  . None   Social History Narrative  . None    Outpatient Encounter Prescriptions as of 02/26/2017  Medication Sig  . amiodarone (PACERONE) 200 MG tablet take 1 tablet by mouth once daily  . atenolol (TENORMIN) 25 MG tablet take 1 tablet by mouth once daily  . atorvastatin (LIPITOR) 40 MG tablet take 1 tablet by mouth once daily  . CARTIA XT 120 MG 24 hr capsule take 1 capsule by mouth once daily  . ELIQUIS 5 MG TABS tablet take 1 tablet by mouth twice a day  . gabapentin (NEURONTIN) 100 MG capsule Take 1 capsule (100 mg total) by mouth 3 (three) times daily. (Patient taking differently: Take 300 mg by mouth 3 (three) times daily. )  . losartan-hydrochlorothiazide (HYZAAR) 100-12.5 MG tablet take 1 tablet by mouth once daily  . meloxicam (MOBIC) 7.5 MG tablet Take 7.5 mg by mouth daily.   . Oxcarbazepine (TRILEPTAL) 300 MG tablet Take 300 mg by mouth 3 (three) times daily.    No facility-administered encounter medications on file  as of 02/26/2017.     Review of Systems  Constitutional:       Able to eat soft foods.  Has lost some weight.   HENT: Negative for congestion and sinus pressure.   Respiratory: Negative for cough, chest tightness and shortness of breath.   Cardiovascular: Negative for chest pain, palpitations and leg swelling.  Gastrointestinal: Negative for abdominal pain, diarrhea, nausea and vomiting.  Genitourinary: Negative for difficulty urinating and dysuria.  Musculoskeletal: Negative for back pain and joint swelling.  Skin: Negative for color change and rash.  Neurological: Negative for dizziness and light-headedness.       Head pain as outlined.    Psychiatric/Behavioral: Negative for agitation and dysphoric mood.       Objective:    Physical Exam  Constitutional: She appears well-developed and  well-nourished. No distress.  HENT:  Nose: Nose normal.  Mouth/Throat: Oropharynx is clear and moist.  Neck: Neck supple. No thyromegaly present.  Cardiovascular: Normal rate and regular rhythm.   Pulmonary/Chest: Breath sounds normal. No respiratory distress. She has no wheezes.  Abdominal: Soft. Bowel sounds are normal. There is no tenderness.  Musculoskeletal: She exhibits no edema or tenderness.  Lymphadenopathy:    She has no cervical adenopathy.  Skin: No rash noted. No erythema.  Psychiatric: She has a normal mood and affect. Her behavior is normal.    BP 138/86 (BP Location: Left Arm, Patient Position: Sitting, Cuff Size: Normal)   Pulse 60   Temp 97.5 F (36.4 C) (Oral)   Resp 12   Ht 5' (1.524 m)   Wt 207 lb 9.6 oz (94.2 kg)   SpO2 95%   BMI 40.54 kg/m  Wt Readings from Last 3 Encounters:  02/26/17 207 lb 9.6 oz (94.2 kg)  09/19/16 209 lb 12 oz (95.1 kg)  08/15/16 210 lb (95.3 kg)     Lab Results  Component Value Date   WBC 6.1 02/26/2017   HGB 15.0 02/26/2017   HCT 43.7 02/26/2017   PLT 196.0 02/26/2017   GLUCOSE 106 (H) 02/26/2017   CHOL 157 02/26/2017   TRIG 86.0 02/26/2017   HDL 47.50 02/26/2017   LDLCALC 92 02/26/2017   ALT 30 02/26/2017   AST 26 02/26/2017   NA 139 02/26/2017   K 4.0 02/26/2017   CL 103 02/26/2017   CREATININE 0.95 02/26/2017   BUN 18 02/26/2017   CO2 29 02/26/2017   TSH 1.56 07/28/2016   INR 1.0 05/23/2014   HGBA1C 6.0 02/26/2017   MICROALBUR 35.5 (H) 04/08/2016       Assessment & Plan:   Problem List Items Addressed This Visit    Atrial fibrillation with RVR ([place])    Followed by cardiology.  Currently stable.  Has f/u soon with Dr Rockey Situ.       BMI 40.0-44.9, adult ([place])    Have discussed diet and exercise.        Hypercholesterolemia    On lipitor.  Low cholesterol diet and exercise.  Follow lipid panel and liver function tests.        Relevant Orders   Hepatic function panel (Completed)   Lipid panel  (Completed)   Hyperglycemia    Low carb diet and exercise.  Follow met b and a1c.        Relevant Orders   Hemoglobin A1c (Completed)   Basic metabolic panel (Completed)   Hypertension    Blood pressures as outlined.  Varies with increased pain.  Hold on making changes in medication.  Follow pressures.  Follow metabolic panel.        Relevant Orders   CBC with Differential/Platelet (Completed)   Meningioma (HCC)    Diagnosed with cerebellopontine angle meningioma.  S/p gamma knife treatments.  Followed by Dr Vallarie Mare for her treatments.   Persistent pain  Would like to see neurologist from [facility] for further evaluation.       Relevant Orders   Ambulatory referral to Neurology   Spinal stenosis    S/p surgery.  Has been followed by Dr Carloyn Manner.  Stable.        Trigeminal neuralgia of left side of face    Originally diagnosed with trigeminal neuralgia.  Subsequently had MRI that revealed meningioma as outlined.  S/p gamma knife treatments.  Planning for f/u in 07/2017.            [name], MD

## 2017-03-01 NOTE — Assessment & Plan Note (Signed)
Low carb diet and exercise.  Follow met b and a1c.   

## 2017-03-01 NOTE — Assessment & Plan Note (Signed)
Originally diagnosed with trigeminal neuralgia.  Subsequently had MRI that revealed meningioma as outlined.  S/p gamma knife treatments.  Planning for f/u in 07/2017.

## 2017-03-01 NOTE — Assessment & Plan Note (Signed)
Followed by cardiology.  Currently stable.  Has f/u soon with Dr Rockey Situ.

## 2017-03-01 NOTE — Assessment & Plan Note (Signed)
S/p surgery.  Has been followed by Dr Carloyn Manner.  Stable.

## 2017-03-01 NOTE — Assessment & Plan Note (Signed)
Have discussed diet and exercise.

## 2017-03-01 NOTE — Assessment & Plan Note (Signed)
On lipitor.  Low cholesterol diet and exercise.  Follow lipid panel and liver function tests.   

## 2017-03-01 NOTE — Assessment & Plan Note (Signed)
Blood pressures as outlined.  Varies with increased pain.  Hold on making changes in medication.  Follow pressures.  Follow metabolic panel.

## 2017-03-01 NOTE — Assessment & Plan Note (Signed)
Diagnosed with cerebellopontine angle meningioma.  S/p gamma knife treatments.  Followed by Dr Vallarie Mare for her treatments.   Persistent pain  Would like to see neurologist from [facility] for further evaluation.

## 2017-03-02 ENCOUNTER — Encounter: Payer: Self-pay | Admitting: Internal Medicine

## 2017-03-03 NOTE — Telephone Encounter (Signed)
See her my chart message.  She wants to see Dr Felecia Shelling in [place].  Please schedule an appt with him instead.  Let me know if I need to place order for another referral.  Thanks

## 2017-03-03 NOTE — Telephone Encounter (Signed)
Please schedule with Dr Felecia Shelling in [place] (instead of [facility]).  Let me know if I need to place an order for another referral.  Thanks.    Dr Nicki Reaper

## 2017-03-04 ENCOUNTER — Other Ambulatory Visit: Payer: Self-pay | Admitting: Internal Medicine

## 2017-03-09 ENCOUNTER — Encounter: Payer: Self-pay | Admitting: Internal Medicine

## 2017-03-09 NOTE — Telephone Encounter (Signed)
Dr. Nicki Reaper, I can not get her appointment moved. You may need to call his office to speak with someone. Their number is [phone].

## 2017-03-09 NOTE — Telephone Encounter (Signed)
Will you please contact pt and giver her update and see if we can get an earlier appt.  Thanks

## 2017-03-22 NOTE — Progress Notes (Signed)
Cardiology Office Note  Date:  03/24/2017   ID:  Kristin James, DOB Jun 19, 1956, MRN [id number]  PCP:  Kristin Pheasant, MD   Chief Complaint  Patient presents with  . other    6 month follow up. Patient deneis chest pain and SOB. Meds reviewed verbally with patient.     HPI:  Kristin James is a pleasant 61 year old woman with history of  paroxysmal atrial fibrillation,  HTN,  chronic back pain,  spinal stenosis,  obesity,   Trigeminal neuralgia hospital in September 2015 for acute left lower extremity arterial ischemia secondary to thrombus. She presents for routine followup of her atrial fibrillation  In follow-up today she found a tumor pressing on her fifth cranial nerve Had gamma knife treatment over 4 days  Scheduled for MRI November 2018 to reimage tumor  Continued pain , trigeminal neuralgia   Bp 130s at home Weight down 10 pounds Unable to eat very much secondary to pain when eating, from nerve Denies any tachycardia or palpitations concerning for atrial fibrillation Tolerating her Lipitor  Denies having tachycardia or palpitations concerning for paroxysmal atrial fibrillation On her last clinic visit we talked about moving her Cardizem her atenolol to the evening and taking other medications in the morning. She continues to take all of her medications in the morning. Does not feel they're causing a problem  Denies any chest pain or shortness of breath concerning for angina  Total cholesterol 150s Reviewed with her  EKG personally reviewed by myself on todays visit Normal sinus rhythm with no significant ST or T-wave changesShows    other past medical history reviewed   Trigeminal neuralgia x 6 months. She has seen several specialists, Including prednisone, Neurontin, trileptal among others  Severe pain left side of face, shocking pain,   Other past medical history back surgery March 2016, lumbar region Prior office visit, EKG documented atrial  fibrillation. Started on amiodarone, diltiazem, beta blocker and she converted to normal sinus rhythm. Atenolol dose decreased secondary to bradycardia  admitted to the hospital on May 21 2014 with acute pain in her left leg. She had no distal pulses. Her creatinine was 1.99 which improved to 1.0 with fluids. She underwent angiogram of the left lower extremity with thrombolysis with TPA of the left pill, tibial peroneal trunk, peroneal arteries and distally, thrombectomy of the vessel with restoration of flow. she does not have any sleep apnea, does have occasional snoring  Stress test was done in the hospital for chest pain. This showed no ischemia. Ejection fraction 67% EKG in the hospital showed normal sinus rhythm with rate 74 beats per minute. EKG dated 05/21/2014   PMH:   has a past medical history of Embolus to lower extremity (Kristin James); Hiatal hernia; Hypercholesteremia; Hyperglycemia; Hypertension; Osteoarthritis; Peripheral arterial disease (Kristin James); Proteinuria; Spinal stenosis at L4-L5 level; and Trigeminal neuralgia (08/2016).  PSH:    Past Surgical History:  Procedure Laterality Date  . AORTOGRAM    . BACK SURGERY  11/13/2014  . CHOLECYSTECTOMY    . COLONOSCOPY WITH PROPOFOL N/A 08/15/2016   Procedure: COLONOSCOPY WITH PROPOFOL;  Surgeon: Kristin Sails, MD;  Location[facility] ENDOSCOPY;  Service: Endoscopy;  Laterality: N/A;  . KNEE ARTHROSCOPY    . LOWER EXTREMITY ANGIOGRAM      Current Outpatient Prescriptions  Medication Sig Dispense Refill  . amiodarone (PACERONE) 200 MG tablet take 1 tablet by mouth once daily 90 tablet 3  . atenolol (TENORMIN) 25 MG tablet take 1 tablet by mouth once  daily 90 tablet 3  . atorvastatin (LIPITOR) 40 MG tablet take 1 tablet by mouth once daily 90 tablet 1  . CARTIA XT 120 MG 24 hr capsule take 1 capsule by mouth once daily 90 capsule 2  . ELIQUIS 5 MG TABS tablet take 1 tablet by mouth twice a day 180 tablet 3  . gabapentin  (NEURONTIN) 100 MG capsule Take 1 capsule (100 mg total) by mouth 3 (three) times daily. (Patient taking differently: Take 300 mg by mouth 3 (three) times daily. ) 90 capsule 1  . losartan-hydrochlorothiazide (HYZAAR) 100-12.5 MG tablet take 1 tablet by mouth once daily 30 tablet 1  . meloxicam (MOBIC) 7.5 MG tablet Take 7.5 mg by mouth daily.   0  . Oxcarbazepine (TRILEPTAL) 300 MG tablet Take 300 mg by mouth 3 (three) times daily.      No current facility-administered medications for this visit.      Allergies:   Patient has no known allergies.   Social History:  The patient  reports that she has never smoked. She has never used smokeless tobacco. She reports that she does not drink alcohol or use drugs.   Family History:   family history includes Breast cancer in her paternal grandmother; Hypertension in her father and mother; Stroke in her father.    Review of Systems: Review of Systems  Constitutional: Negative.   HENT:       Severe left-sided facial pain  Respiratory: Negative.   Cardiovascular: Negative.   Gastrointestinal: Negative.   Musculoskeletal: Negative.   Neurological: Negative.        Nerve pain in her face trigeminal neuralgia  Psychiatric/Behavioral: Negative.   All other systems reviewed and are negative.    PHYSICAL EXAM: VS:  BP (!) 164/90 (BP Location: Left Arm, Patient Position: Sitting, Cuff Size: Normal)   Pulse (!) 56   Ht 4\' 11"  (1.499 m)   Wt 200 lb 8 oz (90.9 kg)   BMI 40.50 kg/m  , BMI Body mass index is 40.5 kg/m.  GEN: Well nourished, well developed, in no acute distress , obese HEENT: normal  Neck: no JVD, carotid bruits, or masses Cardiac: RRR; no murmurs, rubs, or gallops,no edema  Respiratory:  clear to auscultation bilaterally, normal work of breathing GI: soft, nontender, nondistended, + BS MS: no deformity or atrophy  Skin: warm and dry, no rash Neuro:  Strength and sensation are intact Psych: euthymic mood, full  affect    Recent Labs: 07/28/2016: TSH 1.56 02/26/2017: ALT 30; BUN 18; Creatinine, Ser 0.95; Hemoglobin 15.0; Platelets 196.0; Potassium 4.0; Sodium 139    Lipid Panel Lab Results  Component Value Date   CHOL 157 02/26/2017   HDL 47.50 02/26/2017   LDLCALC 92 02/26/2017   TRIG 86.0 02/26/2017      Wt Readings from Last 3 Encounters:  03/24/17 200 lb 8 oz (90.9 kg)  02/26/17 207 lb 9.6 oz (94.2 kg)  09/19/16 209 lb 12 oz (95.1 kg)       ASSESSMENT AND PLAN:   Essential hypertension Blood pressure is well controlled on today's visit. No changes made to the medications.  suggested she try to take the diltiazem in the evening   Hypercholesterolemia Encouraged her to stay on her Lipitor Cholesterol at goal   Morbid obesity ([place]) Unable to exercise at this time secondary to severe pain in her left face, jaw Recommended low carbohydrate diet  Atrial fibrillation with RVR ([place]) - Plan: EKG 12-Lead Sinus bradycardia on today's  visit, denies any symptoms concerning for arrhythmia Recommended she move Cardizem to the evening Continue amiodarone and atenolol in the morning  Trigeminal neuralgia of left side of face Discussion as above Repeat MRI November 2018   Total encounter time more than 15 minutes  Greater than 50% was spent in counseling and coordination of care with the patient   Disposition:   F/U  12 months   No orders of the defined types were placed in this encounter.    Signed, Esmond Plants, M.D., Ph.D. 03/24/2017  [place], [place]

## 2017-03-24 ENCOUNTER — Encounter: Payer: Self-pay | Admitting: Cardiovascular Disease

## 2017-03-24 ENCOUNTER — Ambulatory Visit (INDEPENDENT_AMBULATORY_CARE_PROVIDER_SITE_OTHER): Payer: BLUE CROSS/BLUE SHIELD | Admitting: Cardiovascular Disease

## 2017-03-24 VITALS — BP 150/80 | HR 56 | Ht 59.0 in | Wt 200.5 lb

## 2017-03-24 DIAGNOSIS — R079 Chest pain, unspecified: Secondary | ICD-10-CM

## 2017-03-24 DIAGNOSIS — I10 Essential (primary) hypertension: Secondary | ICD-10-CM | POA: Diagnosis not present

## 2017-03-24 DIAGNOSIS — I4891 Unspecified atrial fibrillation: Secondary | ICD-10-CM

## 2017-03-24 DIAGNOSIS — E7800 Pure hypercholesterolemia, unspecified: Secondary | ICD-10-CM

## 2017-03-24 DIAGNOSIS — I739 Peripheral vascular disease, unspecified: Secondary | ICD-10-CM | POA: Diagnosis not present

## 2017-03-24 NOTE — Patient Instructions (Addendum)
Medication Instructions:   No medication changes made  Consider moving the diltiazem or atenolol to the evening  Labwork:  No new labs needed  Testing/Procedures:  No further testing at this time   Follow-Up: It was a pleasure seeing you in the office today. Please call us if you have new issues that need to be addressed before your next appt.  [phone]  Your physician wants you to follow-up in: 12 months.  You will receive a reminder letter in the mail two months in advance. If you don't receive a letter, please call our office to schedule the follow-up appointment.  If you need a refill on your cardiac medications before your next appointment, please call your pharmacy.

## 2017-04-20 DIAGNOSIS — I4891 Unspecified atrial fibrillation: Secondary | ICD-10-CM | POA: Diagnosis not present

## 2017-04-20 DIAGNOSIS — E669 Obesity, unspecified: Secondary | ICD-10-CM | POA: Diagnosis not present

## 2017-04-20 DIAGNOSIS — G500 Trigeminal neuralgia: Secondary | ICD-10-CM | POA: Diagnosis not present

## 2017-04-23 ENCOUNTER — Ambulatory Visit (INDEPENDENT_AMBULATORY_CARE_PROVIDER_SITE_OTHER): Payer: BLUE CROSS/BLUE SHIELD | Admitting: Neurology

## 2017-04-23 ENCOUNTER — Encounter: Payer: Self-pay | Admitting: Neurology

## 2017-04-23 VITALS — BP 163/83 | HR 53 | Resp 18 | Ht 59.0 in | Wt 201.5 lb

## 2017-04-23 DIAGNOSIS — G500 Trigeminal neuralgia: Secondary | ICD-10-CM | POA: Diagnosis not present

## 2017-04-23 DIAGNOSIS — Z923 Personal history of irradiation: Secondary | ICD-10-CM

## 2017-04-23 DIAGNOSIS — D329 Benign neoplasm of meninges, unspecified: Secondary | ICD-10-CM | POA: Diagnosis not present

## 2017-04-23 DIAGNOSIS — R51 Headache: Secondary | ICD-10-CM | POA: Diagnosis not present

## 2017-04-23 DIAGNOSIS — R519 Headache, unspecified: Secondary | ICD-10-CM

## 2017-04-23 MED ORDER — GABAPENTIN 600 MG PO TABS
600.0000 mg | ORAL_TABLET | Freq: Three times a day (TID) | ORAL | 5 refills | Status: DC
Start: 2017-04-23 — End: 2017-10-24

## 2017-04-23 NOTE — Progress Notes (Signed)
[facility]  PATIENT: Kristin James DOB: 1955-10-30  REFERRING DOCTOR OR PCP:  [name] SOURCE: Patient, notes from Dr. Nicki Reaper,  _________________________________   HISTORICAL  CHIEF COMPLAINT:  Chief Complaint  Patient presents with  . Trigeminal Neuralgia    Kristin James is here with her husband Kristin James for eval of Trigeminal Neuralgia, secondary to meningioma contacting the trigeminal nerve. .  Had gamma knife procedure 11/17/16 at [facility] (Dr. Vallarie Mare).  No relief of pain after procedure.  On Trileptal and Gabapentin for same.  Has been seen by Dr. Melrose Nakayama at the [facility] for same./fim    HISTORY OF PRESENT ILLNESS:  I had the pleasure seeing you patient, Kristin James, at [facility] for neurologic consultation regarding her left trigeminal neuralgia.  She is a 61 year old woman who began to experience a dull ache in the left jaw region in mid 2017. Initially, she felt her symptoms were due to a dental issue and she had a tooth removed and a repeat root canal. However, there was no improvement. A few months later she began to experience short shock like severe pain in the left jaw. Her primary care provider, Dr. Nicki Reaper started gabapentin and referred her to Dr. Melrose Nakayama in [place].   She had a dull pain and would get frequent zaps of severe pain into the left cheek.  When pain was more severe, she couldn't talk.   Brushing her teeth would trigger the most painful sensations.   She was initially placed on gabapentin 100 mg several times a day by her PCP and then Tegretol was added. The Tegretol was switched to Trileptal for tolerability. She continued to have pain and was referred to neurosurgery at [facility] for evaluation of gamma knife procedures after an MRI of the brain showed that she had a meningioma that involved the facial nerve region.    She underwent Gamma knife stereotactic radiosurgery by Dr. Arlan Organ and Dr. Vallarie Mare at [facility]  neurosurgery and radiation oncology at a dose of 20 Gy was delivered to the outlined tumor divided into 4 fractions.   She felt pain improved some initially but did not resolve.   Pain worsened in July, and gabapentin and oxcarbazepine were both increased from 300 mg po tid to 300 mg po qid and 300 mg po tid.    She is tolerating her medications fairly well right now. She does feel little bit off balance at times and still does not feel as sharp as she did before all of the medications. She has only been on the higher dose for several days and continues to experience symptoms though they are better than they were a couple weeks ago.  She stopped driving 3-4 weeks ago due to visual changes and a foggy sensation and slurred speech that was bad x 2 weeks but now better.   She still feels memory is bad  I reviewed imaging reports. The MRI of the brain 10/01/2016 performed at [place] shows a large left cerebellopontine angle meningioma extending into the left internal auditory canal. There is mass effect on the pons and left vertebral artery. The tumor crosses the expected path of the left cranial nerves V, VI, VII and VIII. also reviewed the MRI of the brain 11/17/2016 showing the same mass and mentioning redemonstration of encephalomalacia in the left parieto-occipital region and lacunar infarction in the right cerebellum and thalamus. REVIEW OF SYSTEMS: Constitutional: No fevers, chills, sweats, or change in appetite Eyes: No visual changes,  double vision, eye pain Ear, nose and throat: No hearing loss, ear pain, nasal congestion, sore throat Cardiovascular: No chest pain, palpitations Respiratory: No shortness of breath at rest or with exertion.   No wheezes GastrointestinaI: No nausea, vomiting, diarrhea, abdominal pain, fecal incontinence Genitourinary: No dysuria, urinary retention or frequency.  No nocturia. Musculoskeletal: No neck pain, back pain Integumentary: No rash, pruritus, skin  lesions Neurological: as above Psychiatric: No depression at this time.  No anxiety Endocrine: No palpitations, diaphoresis, change in appetite, change in weigh or increased thirst Hematologic/Lymphatic: No anemia, purpura, petechiae. Allergic/Immunologic: No itchy/runny eyes, nasal congestion, recent allergic reactions, rashes  ALLERGIES: No Known Allergies  HOME MEDICATIONS:  Current Outpatient Prescriptions:  .  amiodarone (PACERONE) 200 MG tablet, take 1 tablet by mouth once daily, Disp: 90 tablet, Rfl: 3 .  atenolol (TENORMIN) 25 MG tablet, take 1 tablet by mouth once daily, Disp: 90 tablet, Rfl: 3 .  atorvastatin (LIPITOR) 40 MG tablet, take 1 tablet by mouth once daily, Disp: 90 tablet, Rfl: 1 .  CARTIA XT 120 MG 24 hr capsule, take 1 capsule by mouth once daily, Disp: 90 capsule, Rfl: 2 .  ELIQUIS 5 MG TABS tablet, take 1 tablet by mouth twice a day, Disp: 180 tablet, Rfl: 3 .  gabapentin (NEURONTIN) 100 MG capsule, Take 1 capsule (100 mg total) by mouth 3 (three) times daily. (Patient taking differently: Take 300 mg by mouth 3 (three) times daily. ), Disp: 90 capsule, Rfl: 1 .  losartan-hydrochlorothiazide (HYZAAR) 100-12.5 MG tablet, take 1 tablet by mouth once daily, Disp: 30 tablet, Rfl: 1 .  meloxicam (MOBIC) 7.5 MG tablet, Take 7.5 mg by mouth daily. , Disp: , Rfl: 0 .  Oxcarbazepine (TRILEPTAL) 300 MG tablet, Take 300 mg by mouth 3 (three) times daily. , Disp: , Rfl:   PAST MEDICAL HISTORY: Past Medical History:  Diagnosis Date  . Embolus to lower extremity ([place])   . Hiatal hernia   . Hypercholesteremia   . Hyperglycemia   . Hypertension   . Osteoarthritis   . Peripheral arterial disease (HCC)    embolic disease   . Proteinuria   . Spinal stenosis at L4-L5 level   . Trigeminal neuralgia 08/2016    PAST SURGICAL HISTORY: Past Surgical History:  Procedure Laterality Date  . AORTOGRAM    . BACK SURGERY  11/13/2014  . CHOLECYSTECTOMY    . COLONOSCOPY WITH  PROPOFOL N/A 08/15/2016   Procedure: COLONOSCOPY WITH PROPOFOL;  Surgeon: Lollie Sails, MD;  Location: [facility] ENDOSCOPY;  Service: Endoscopy;  Laterality: N/A;  . KNEE ARTHROSCOPY    . LOWER EXTREMITY ANGIOGRAM      FAMILY HISTORY: Family History  Problem Relation Age of Onset  . Hypertension Mother   . Stroke Father        3 strokes  . Hypertension Father   . Breast cancer Paternal Grandmother   . Colon cancer Neg Hx     SOCIAL HISTORY:  Social History   Social History  . Marital status: Married    Spouse name: N/A  . Number of children: 2  . Years of education: N/A   Occupational History  . Not on file.   Social History Main Topics  . Smoking status: Never Smoker  . Smokeless tobacco: Never Used  . Alcohol use No  . Drug use: No  . Sexual activity: Not on file   Other Topics Concern  . Not on file   Social History Narrative  .  No narrative on file     PHYSICAL EXAM  Vitals:   04/23/17 1018  BP: (!) 163/83  Pulse: (!) 53  Resp: 18  Weight: 201 lb 8 oz (91.4 kg)  Height: 4\' 11"  (1.499 m)    Body mass index is 40.7 kg/m.   General: The patient is well-developed and well-nourished and in no acute distress  Eyes:  Funduscopic exam shows normal optic discs and retinal vessels.  Neck: The neck is supple, no carotid bruits are noted.  The neck is nontender.  Cardiovascular: The heart has a regular rate and rhythm with a normal S1 and S2. There were no murmurs, gallops or rubs. Lungs are clear to auscultation.  Skin: Extremities are without significant edema.  Musculoskeletal:  Back is nontender  Neurologic Exam  Mental status: The patient is alert and oriented x 3 at the time of the examination. The patient has apparent normal recent and remote memory, with an apparently normal attention span and concentration ability.   Speech is normal.  Cranial nerves: Extraocular movements are full. Pupils are equal, round, and reactive to light and  accomodation.  Visual fields are full.  Facial symmetry is present. There is good facial sensation to soft touch bilaterally.Facial strength is normal.  Trapezius and sternocleidomastoid strength is normal. No dysarthria is noted.  The tongue is midline, and the patient has symmetric elevation of the soft palate. No obvious hearing deficits are noted.  Motor:  Muscle bulk is normal.   Tone is normal. Strength is  5 / 5 in all 4 extremities.   Sensory: Sensory testing is intact to pinprick, soft touch and vibration sensation in all 4 extremities.  Coordination: Cerebellar testing reveals good finger-nose-finger and heel-to-shin bilaterally.  Gait and station: Station is normal.   Gait is normal. Tandem gait is normal. Romberg is negative.   Reflexes: Deep tendon reflexes are symmetric and normal bilaterally.   Plantar responses are flexor.    DIAGNOSTIC DATA (LABS, IMAGING, TESTING) - I reviewed patient records, labs, notes, testing and imaging myself where available.  Lab Results  Component Value Date   WBC 6.1 02/26/2017   HGB 15.0 02/26/2017   HCT 43.7 02/26/2017   MCV 91.4 02/26/2017   PLT 196.0 02/26/2017      Component Value Date/Time   NA 139 02/26/2017 1054   NA 142 05/23/2014 0540   NA 142 05/23/2014 0540   K 4.0 02/26/2017 1054   K 3.7 05/23/2014 0540   CL 103 02/26/2017 1054   CL 114 (H) 05/23/2014 0540   CO2 29 02/26/2017 1054   CO2 21 05/23/2014 0540   GLUCOSE 106 (H) 02/26/2017 1054   GLUCOSE 90 05/23/2014 0540   BUN 18 02/26/2017 1054   BUN 16 05/23/2014 0540   BUN 16 05/23/2014 0540   CREATININE 0.95 02/26/2017 1054   CREATININE 1.07 05/23/2014 0540   CALCIUM 9.2 02/26/2017 1054   CALCIUM 7.4 (L) 05/23/2014 0540   PROT 6.5 02/26/2017 1054   PROT 7.4 05/17/2013 1028   ALBUMIN 4.0 02/26/2017 1054   ALBUMIN 3.6 05/17/2013 1028   AST 26 02/26/2017 1054   AST 28 05/17/2013 1028   ALT 30 02/26/2017 1054   ALT 27 05/17/2013 1028   ALKPHOS 107 02/26/2017  1054   ALKPHOS 103 05/17/2013 1028   BILITOT 0.7 02/26/2017 1054   BILITOT 0.8 05/17/2013 1028   GFRNONAA 57 (L) 05/23/2014 0540   GFRAA >60 05/23/2014 0540   Lab Results  Component Value Date  CHOL 157 02/26/2017   HDL 47.50 02/26/2017   LDLCALC 92 02/26/2017   TRIG 86.0 02/26/2017   CHOLHDL 3 02/26/2017   Lab Results  Component Value Date   HGBA1C 6.0 02/26/2017   No results found for: VITAMINB12 Lab Results  Component Value Date   TSH 1.56 07/28/2016       ASSESSMENT AND PLAN   Meningioma (HCC)  Trigeminal neuralgia of left side of face  Left facial pain  Status post gamma knife treatment   In summary, Kristin James is a 61 year old woman with a left infratentorial meningioma involving the left internal auditory canal, distorting the pons and leading to left trigeminal neuralgia.   She she is status post gamma knife procedure, with incomplete improvement.    She has only been on her current dose on oxcarbazepine and gabapentin for a few days. I will see if she is able to tolerate a higher dose of gabapentin up to 1800 mg daily and continue the oxcarbazepine at 1200 mg daily. She should back down to 1200 mg of gabapentin if she cannot tolerate 1800 mg . They will call in 2-3 weeks. If no better at all, consider switching the gabapentin to lamotrigine.     If neither of these regimens is helpful, then she may need to have another procedure such as radiofrequency ablation.   She will be seeing Dr. Vallarie Mare and Dr. Arlan Organ in early November. She will have repeat MRI at that time.  Thank you for asking me to see Kristin James for neurologic consultation. Please let me know if I can be of further assistance with her or other patients in the future.   Anzley Dibbern A. Felecia Shelling, MD, [facility] 9/73/5329, 92:42 AM Certified in Neurology, Clinical Neurophysiology, Sleep Medicine, Pain Medicine and Neuroimaging  [facility] [address], [place], [place] [address] [phone]

## 2017-05-03 ENCOUNTER — Other Ambulatory Visit: Payer: Self-pay | Admitting: Internal Medicine

## 2017-05-05 ENCOUNTER — Other Ambulatory Visit: Payer: Self-pay | Admitting: Internal Medicine

## 2017-05-05 DIAGNOSIS — Z1231 Encounter for screening mammogram for malignant neoplasm of breast: Secondary | ICD-10-CM

## 2017-05-11 ENCOUNTER — Other Ambulatory Visit: Payer: Self-pay | Admitting: Internal Medicine

## 2017-05-12 ENCOUNTER — Other Ambulatory Visit: Payer: Self-pay | Admitting: Internal Medicine

## 2017-05-13 ENCOUNTER — Other Ambulatory Visit: Payer: Self-pay | Admitting: Internal Medicine

## 2017-05-17 ENCOUNTER — Encounter: Payer: Self-pay | Admitting: Internal Medicine

## 2017-05-26 ENCOUNTER — Ambulatory Visit
Admission: RE | Admit: 2017-05-26 | Discharge: 2017-05-26 | Disposition: A | Discharge status: Home / Self Care | Payer: BLUE CROSS/BLUE SHIELD | Source: Ambulatory Visit | Attending: Internal Medicine | Admitting: Internal Medicine

## 2017-05-26 DIAGNOSIS — Z1231 Encounter for screening mammogram for malignant neoplasm of breast: Secondary | ICD-10-CM | POA: Insufficient documentation

## 2017-06-05 ENCOUNTER — Other Ambulatory Visit (HOSPITAL_COMMUNITY)
Admission: RE | Admit: 2017-06-05 | Discharge: 2017-06-05 | Disposition: A | Discharge status: Home / Self Care | Payer: BLUE CROSS/BLUE SHIELD | Source: Ambulatory Visit | Attending: Internal Medicine | Admitting: Internal Medicine

## 2017-06-05 ENCOUNTER — Ambulatory Visit (INDEPENDENT_AMBULATORY_CARE_PROVIDER_SITE_OTHER): Payer: BLUE CROSS/BLUE SHIELD | Admitting: Internal Medicine

## 2017-06-05 ENCOUNTER — Encounter: Payer: Self-pay | Admitting: Internal Medicine

## 2017-06-05 VITALS — BP 150/82 | HR 57 | Temp 98.6°F | Resp 14 | Ht 59.0 in | Wt 202.8 lb

## 2017-06-05 DIAGNOSIS — D329 Benign neoplasm of meninges, unspecified: Secondary | ICD-10-CM | POA: Diagnosis not present

## 2017-06-05 DIAGNOSIS — R739 Hyperglycemia, unspecified: Secondary | ICD-10-CM | POA: Diagnosis not present

## 2017-06-05 DIAGNOSIS — Z0000 Encounter for general adult medical examination without abnormal findings: Secondary | ICD-10-CM

## 2017-06-05 DIAGNOSIS — Z6841 Body Mass Index (BMI) 40.0 and over, adult: Secondary | ICD-10-CM | POA: Diagnosis not present

## 2017-06-05 DIAGNOSIS — M5440 Lumbago with sciatica, unspecified side: Secondary | ICD-10-CM

## 2017-06-05 DIAGNOSIS — I10 Essential (primary) hypertension: Secondary | ICD-10-CM | POA: Diagnosis not present

## 2017-06-05 DIAGNOSIS — D582 Other hemoglobinopathies: Secondary | ICD-10-CM

## 2017-06-05 DIAGNOSIS — E7800 Pure hypercholesterolemia, unspecified: Secondary | ICD-10-CM

## 2017-06-05 DIAGNOSIS — I4891 Unspecified atrial fibrillation: Secondary | ICD-10-CM | POA: Diagnosis not present

## 2017-06-05 DIAGNOSIS — Z124 Encounter for screening for malignant neoplasm of cervix: Secondary | ICD-10-CM | POA: Insufficient documentation

## 2017-06-05 DIAGNOSIS — M4800 Spinal stenosis, site unspecified: Secondary | ICD-10-CM

## 2017-06-05 DIAGNOSIS — Z23 Encounter for immunization: Secondary | ICD-10-CM

## 2017-06-05 MED ORDER — NYSTATIN 100000 UNIT/GM EX CREA: 1.0000 "application " | TOPICAL_CREAM | Freq: Two times a day (BID) | CUTANEOUS | 0 refills | Status: DC

## 2017-06-05 NOTE — Progress Notes (Signed)
Patient ID: Kristin James, female   DOB: 06/01/1956, 61 y.o.   MRN: [id number]   Subjective:    Patient ID: Kristin James, female    DOB: 1955-12-09, 62 y.o.   MRN: [id number]  HPI  Patient here for her physical exam.  Has a history of meningioma that involved the facial nerve region.  She underwent Gamma knife sterotactic radiosurgery by Dr Roselyn Bering and Dr Vallarie Mare at [facility] neurosurgery and radiation oncology.  Was having problems with increased pain initially.  Saw Dr Felecia Shelling.  Note reviewed.  Pain is better now.  She has been able to decrease her medication dosages.  Taking gabapentin bid now.  Has f/u MRI scheduled 07/18/17.  Has f/u eye check in two weeks.  She is walking.  Trying to exercise on a more regular basis.  No chest pain.  No sob.  No acid reflux.  No abdominal pain.  Bowels moving.  Trying to watch her diet.    Past Medical History:  Diagnosis Date  . Embolus to lower extremity ([place])   . Hiatal hernia   . Hypercholesteremia   . Hyperglycemia   . Hypertension   . Osteoarthritis   . Peripheral arterial disease (HCC)    embolic disease   . Proteinuria   . Spinal stenosis at L4-L5 level   . Trigeminal neuralgia 08/2016   Past Surgical History:  Procedure Laterality Date  . AORTOGRAM    . BACK SURGERY  11/13/2014  . CHOLECYSTECTOMY    . COLONOSCOPY WITH PROPOFOL N/A 08/15/2016   Procedure: COLONOSCOPY WITH PROPOFOL;  Surgeon: Lollie Sails, MD;  Location: [facility] ENDOSCOPY;  Service: Endoscopy;  Laterality: N/A;  . KNEE ARTHROSCOPY    . LOWER EXTREMITY ANGIOGRAM     Family History  Problem Relation Age of Onset  . Hypertension Mother   . Stroke Father        3 strokes  . Hypertension Father   . Breast cancer Paternal Grandmother   . Colon cancer Neg Hx    Social History   Social History  . Marital status: Married    Spouse name: N/A  . Number of children: 2  . Years of education: N/A   Social History Main Topics  . Smoking status: Never Smoker  .  Smokeless tobacco: Never Used  . Alcohol use No  . Drug use: No  . Sexual activity: Not Asked   Other Topics Concern  . None   Social History Narrative  . None    Outpatient Encounter Prescriptions as of 06/05/2017  Medication Sig  . amiodarone (PACERONE) 200 MG tablet take 1 tablet by mouth once daily  . atenolol (TENORMIN) 25 MG tablet take 1 tablet by mouth once daily  . atorvastatin (LIPITOR) 40 MG tablet take 1 tablet by mouth once daily  . CARTIA XT 120 MG 24 hr capsule take 1 capsule by mouth once daily  . ELIQUIS 5 MG TABS tablet take 1 tablet by mouth twice a day  . gabapentin (NEURONTIN) 100 MG capsule Take 1 capsule (100 mg total) by mouth 3 (three) times daily. (Patient taking differently: Take 300 mg by mouth 3 (three) times daily. )  . gabapentin (NEURONTIN) 600 MG tablet Take 1 tablet (600 mg total) by mouth 3 (three) times daily.  [place] Kitchen losartan-hydrochlorothiazide (HYZAAR) 100-12.5 MG tablet take 1 tablet by mouth once daily  . losartan-hydrochlorothiazide (HYZAAR) 100-12.5 MG tablet take 1 tablet by mouth once daily  . meloxicam (MOBIC) 7.5 MG tablet  Take 7.5 mg by mouth daily.   . Oxcarbazepine (TRILEPTAL) 300 MG tablet Take 300 mg by mouth 3 (three) times daily.   [place] Kitchen nystatin cream (MYCOSTATIN) Apply 1 application topically 2 (two) times daily.   No facility-administered encounter medications on file as of 06/05/2017.     Review of Systems  Constitutional: Negative for appetite change and unexpected weight change.  HENT: Negative for congestion and sinus pressure.   Eyes: Negative for pain and visual disturbance.  Respiratory: Negative for cough, chest tightness and shortness of breath.   Cardiovascular: Negative for chest pain, palpitations and leg swelling.  Gastrointestinal: Negative for abdominal pain, diarrhea, nausea and vomiting.  Genitourinary: Negative for difficulty urinating and dysuria.  Musculoskeletal: Negative for joint swelling and myalgias.  Skin:  Negative for color change and rash.  Neurological: Negative for dizziness and light-headedness.       Head pain is better.    Hematological: Negative for adenopathy. Does not bruise/bleed easily.  Psychiatric/Behavioral: Negative for agitation and dysphoric mood.       Objective:     Blood pressure rechecked by me:  146/82  Physical Exam  Constitutional: She is oriented to person, place, and time. She appears well-developed and well-nourished. No distress.  HENT:  Nose: Nose normal.  Mouth/Throat: Oropharynx is clear and moist.  Eyes: Right eye exhibits no discharge. Left eye exhibits no discharge. No scleral icterus.  Neck: Neck supple. No thyromegaly present.  Cardiovascular: Normal rate and regular rhythm.   1/6 systolic murmur.   Pulmonary/Chest: Breath sounds normal. No accessory muscle usage. No tachypnea. No respiratory distress. She has no decreased breath sounds. She has no wheezes. She has no rhonchi. Right breast exhibits no inverted nipple, no mass, no nipple discharge and no tenderness (no axillary adenopathy). Left breast exhibits no inverted nipple, no mass, no nipple discharge and no tenderness (no axilarry adenopathy).  Abdominal: Soft. Bowel sounds are normal. There is no tenderness.  Musculoskeletal: She exhibits no edema or tenderness.  Lymphadenopathy:    She has no cervical adenopathy.  Neurological: She is alert and oriented to person, place, and time.  Skin: Skin is warm. No rash noted. No erythema.  Psychiatric: She has a normal mood and affect. Her behavior is normal.    BP (!) 150/82 (BP Location: Left Arm, Patient Position: Sitting, Cuff Size: Large)   Pulse (!) 57   Temp 98.6 F (37 C) (Oral)   Resp 14   Ht '4\' 11"'$  (1.499 m)   Wt 202 lb 12.8 oz (92 kg)   SpO2 98%   BMI 40.96 kg/m  Wt Readings from Last 3 Encounters:  06/05/17 202 lb 12.8 oz (92 kg)  04/23/17 201 lb 8 oz (91.4 kg)  03/24/17 200 lb 8 oz (90.9 kg)     Lab Results  Component  Value Date   WBC 6.1 02/26/2017   HGB 15.0 02/26/2017   HCT 43.7 02/26/2017   PLT 196.0 02/26/2017   GLUCOSE 106 (H) 02/26/2017   CHOL 157 02/26/2017   TRIG 86.0 02/26/2017   HDL 47.50 02/26/2017   LDLCALC 92 02/26/2017   ALT 30 02/26/2017   AST 26 02/26/2017   NA 139 02/26/2017   K 4.0 02/26/2017   CL 103 02/26/2017   CREATININE 0.95 02/26/2017   BUN 18 02/26/2017   CO2 29 02/26/2017   TSH 1.56 07/28/2016   INR 1.0 05/23/2014   HGBA1C 6.0 02/26/2017   MICROALBUR 35.5 (H) 04/08/2016    Mm Digital Screening  Bilateral  Result Date: 05/26/2017 CLINICAL DATA:  Screening. EXAM: DIGITAL SCREENING BILATERAL MAMMOGRAM WITH CAD COMPARISON:  Previous exam(s). ACR Breast Density Category b: There are scattered areas of fibroglandular density. FINDINGS: There are no findings suspicious for malignancy. Images were processed with CAD. IMPRESSION: No mammographic evidence of malignancy. A result letter of this screening mammogram will be mailed directly to the patient. RECOMMENDATION: Screening mammogram in one year. (Code:SM-B-01Y) BI-RADS CATEGORY  1: Negative. Electronically Signed   By: Lajean Manes M.D.   On: 05/26/2017 10:46       Assessment & Plan:   Problem List Items Addressed This Visit    Atrial fibrillation with RVR ([place])    Followed by cardiology.  Currently stable.  Sees Dr Rockey Situ.       Back pain    S/p back surgery.  Stable.        BMI 40.0-44.9, adult (HCC)    Discussed diet and exercise.  Follow.        Elevated hemoglobin (HCC)    hgb 15 on last check.  Follow.        Health care maintenance    Physical today 06/05/17.  Mammogram 05/26/17 - Birads I.  Colonoscopy 08/15/16.  Recommended f/u in five years.        Hypercholesterolemia    On lipitor.  Low cholesterol diet and exercise.  Follow lipid panel and liver function tests.        Relevant Orders   Hepatic function panel   Lipid panel   Hyperglycemia    Low carb diet and exercise.  Follow met b and  a1c.  She is exercising.        Relevant Orders   Hemoglobin A1c   Hypertension    Blood pressure on recheck improved, but still slightly elevated.  Same medication regimen.  Follow pressures.  Follow metabolic panel.       Relevant Orders   TSH   Basic metabolic panel   Meningioma ([place])    Diagnosed with cerebellopontinue angle meningioma.  S/p gamma knife treatments.  Followed by Dr Vallarie Mare.  Pain is better.  Seeing Dr Felecia Shelling now.  Planning for f/u MRI 07/08/17.        Spinal stenosis    Has been followed by Dr Carloyn Manner.  S/p surgery.  Stable.         Other Visit Diagnoses    Routine general medical examination at a health care facility    -  Primary   Need for immunization against influenza       Relevant Orders   Flu Vaccine QUAD 36+ mos IM (Completed)   Screening for cervical cancer       Relevant Orders   Cytology - PAP       [name], MD

## 2017-06-05 NOTE — Assessment & Plan Note (Signed)
Physical today 06/05/17.  Mammogram 05/26/17 - Birads I.  Colonoscopy 08/15/16.  Recommended f/u in five years.

## 2017-06-08 ENCOUNTER — Encounter: Payer: Self-pay | Admitting: Internal Medicine

## 2017-06-08 DIAGNOSIS — D582 Other hemoglobinopathies: Secondary | ICD-10-CM | POA: Insufficient documentation

## 2017-06-08 NOTE — Assessment & Plan Note (Signed)
S/p back surgery.  Stable.

## 2017-06-08 NOTE — Assessment & Plan Note (Signed)
hgb 15 on last check.  Follow.

## 2017-06-08 NOTE — Assessment & Plan Note (Signed)
Diagnosed with cerebellopontinue angle meningioma.  S/p gamma knife treatments.  Followed by Dr Vallarie Mare.  Pain is better.  Seeing Dr Felecia Shelling now.  Planning for f/u MRI 07/08/17.

## 2017-06-08 NOTE — Assessment & Plan Note (Signed)
Discussed diet and exercise.  Follow.  

## 2017-06-08 NOTE — Assessment & Plan Note (Signed)
Low carb diet and exercise.  Follow met b and a1c.  She is exercising.

## 2017-06-08 NOTE — Assessment & Plan Note (Signed)
Has been followed by Dr Carloyn Manner.  S/p surgery.  Stable.

## 2017-06-08 NOTE — Assessment & Plan Note (Signed)
Followed by cardiology.  Currently stable.  Sees Dr Rockey Situ.

## 2017-06-08 NOTE — Assessment & Plan Note (Signed)
On lipitor.  Low cholesterol diet and exercise.  Follow lipid panel and liver function tests.   

## 2017-06-08 NOTE — Assessment & Plan Note (Signed)
Blood pressure on recheck improved, but still slightly elevated.  Same medication regimen.  Follow pressures.  Follow metabolic panel.

## 2017-06-09 LAB — CYTOLOGY - PAP
Diagnosis: NEGATIVE
HPV: NOT DETECTED

## 2017-06-10 ENCOUNTER — Encounter: Payer: Self-pay | Admitting: Internal Medicine

## 2017-06-15 ENCOUNTER — Other Ambulatory Visit: Payer: BLUE CROSS/BLUE SHIELD

## 2017-06-15 NOTE — Telephone Encounter (Signed)
Hard copy mailed  

## 2017-06-18 ENCOUNTER — Other Ambulatory Visit: Payer: Self-pay | Admitting: Cardiovascular Disease

## 2017-06-22 ENCOUNTER — Other Ambulatory Visit (INDEPENDENT_AMBULATORY_CARE_PROVIDER_SITE_OTHER): Payer: BLUE CROSS/BLUE SHIELD

## 2017-06-22 ENCOUNTER — Other Ambulatory Visit: Payer: Self-pay | Admitting: Internal Medicine

## 2017-06-22 DIAGNOSIS — I10 Essential (primary) hypertension: Secondary | ICD-10-CM | POA: Diagnosis not present

## 2017-06-22 DIAGNOSIS — R739 Hyperglycemia, unspecified: Secondary | ICD-10-CM

## 2017-06-22 DIAGNOSIS — E7800 Pure hypercholesterolemia, unspecified: Secondary | ICD-10-CM | POA: Diagnosis not present

## 2017-06-22 DIAGNOSIS — E875 Hyperkalemia: Secondary | ICD-10-CM

## 2017-06-22 LAB — LIPID PANEL
Cholesterol: 159 mg/dL (ref 0–200)
HDL: 54.6 mg/dL (ref >39.00)
LDL CALC: 89 mg/dL (ref 0–99)
NONHDL: 104.43
Total CHOL/HDL Ratio: 3
Triglycerides: 76 mg/dL (ref 0.0–149.0)
VLDL: 15.2 mg/dL (ref 0.0–40.0)

## 2017-06-22 LAB — HEPATIC FUNCTION PANEL
ALK PHOS: 104 U/L (ref 39–117)
ALT: 29 U/L (ref 0–35)
AST: 15 U/L (ref 0–37)
Albumin: 4 g/dL (ref 3.5–5.2)
BILIRUBIN TOTAL: 0.5 mg/dL (ref 0.2–1.2)
Bilirubin, Direct: 0.1 mg/dL (ref 0.0–0.3)
Total Protein: 6.7 g/dL (ref 6.0–8.3)

## 2017-06-22 LAB — BASIC METABOLIC PANEL
BUN: 27 mg/dL — AB (ref 6–23)
CO2: 30 mEq/L (ref 19–32)
CREATININE: 0.95 mg/dL (ref 0.40–1.20)
Calcium: 9.3 mg/dL (ref 8.4–10.5)
Chloride: 106 mEq/L (ref 96–112)
GFR: 63.36 mL/min (ref >60.00)
GLUCOSE: 125 mg/dL — AB (ref 70–99)
Potassium: 5.4 mEq/L — ABNORMAL HIGH (ref 3.5–5.1)
SODIUM: 143 meq/L (ref 135–145)

## 2017-06-22 LAB — HEMOGLOBIN A1C: HEMOGLOBIN A1C: 5.9 % (ref 4.6–6.5)

## 2017-06-22 LAB — TSH: TSH: 3.81 u[IU]/mL (ref 0.35–4.50)

## 2017-06-22 NOTE — Progress Notes (Signed)
Order placed for f/u potassium.  

## 2017-06-23 ENCOUNTER — Other Ambulatory Visit: Payer: BLUE CROSS/BLUE SHIELD

## 2017-07-08 DIAGNOSIS — Z7901 Long term (current) use of anticoagulants: Secondary | ICD-10-CM | POA: Diagnosis not present

## 2017-07-08 DIAGNOSIS — D329 Benign neoplasm of meninges, unspecified: Secondary | ICD-10-CM | POA: Diagnosis not present

## 2017-07-08 DIAGNOSIS — Z923 Personal history of irradiation: Secondary | ICD-10-CM | POA: Diagnosis not present

## 2017-07-08 DIAGNOSIS — I4891 Unspecified atrial fibrillation: Secondary | ICD-10-CM | POA: Diagnosis not present

## 2017-07-08 DIAGNOSIS — G500 Trigeminal neuralgia: Secondary | ICD-10-CM | POA: Diagnosis not present

## 2017-07-13 DIAGNOSIS — M47816 Spondylosis without myelopathy or radiculopathy, lumbar region: Secondary | ICD-10-CM | POA: Diagnosis not present

## 2017-07-13 DIAGNOSIS — Z6839 Body mass index (BMI) 39.0-39.9, adult: Secondary | ICD-10-CM | POA: Diagnosis not present

## 2017-07-27 DIAGNOSIS — H524 Presbyopia: Secondary | ICD-10-CM | POA: Diagnosis not present

## 2017-07-27 DIAGNOSIS — H40003 Preglaucoma, unspecified, bilateral: Secondary | ICD-10-CM | POA: Diagnosis not present

## 2017-07-28 ENCOUNTER — Other Ambulatory Visit: Payer: Self-pay | Admitting: Cardiovascular Disease

## 2017-09-07 ENCOUNTER — Other Ambulatory Visit: Payer: Self-pay | Admitting: Internal Medicine

## 2017-09-20 ENCOUNTER — Other Ambulatory Visit: Payer: Self-pay | Admitting: Cardiovascular Disease

## 2017-10-21 ENCOUNTER — Ambulatory Visit (INDEPENDENT_AMBULATORY_CARE_PROVIDER_SITE_OTHER): Payer: BLUE CROSS/BLUE SHIELD | Admitting: Internal Medicine

## 2017-10-21 DIAGNOSIS — N289 Disorder of kidney and ureter, unspecified: Secondary | ICD-10-CM

## 2017-10-21 DIAGNOSIS — M5440 Lumbago with sciatica, unspecified side: Secondary | ICD-10-CM

## 2017-10-21 DIAGNOSIS — D329 Benign neoplasm of meninges, unspecified: Secondary | ICD-10-CM

## 2017-10-21 DIAGNOSIS — D582 Other hemoglobinopathies: Secondary | ICD-10-CM

## 2017-10-21 DIAGNOSIS — I70209 Unspecified atherosclerosis of native arteries of extremities, unspecified extremity: Secondary | ICD-10-CM | POA: Diagnosis not present

## 2017-10-21 DIAGNOSIS — I4891 Unspecified atrial fibrillation: Secondary | ICD-10-CM | POA: Diagnosis not present

## 2017-10-21 DIAGNOSIS — E7800 Pure hypercholesterolemia, unspecified: Secondary | ICD-10-CM | POA: Diagnosis not present

## 2017-10-21 DIAGNOSIS — I10 Essential (primary) hypertension: Secondary | ICD-10-CM | POA: Diagnosis not present

## 2017-10-21 DIAGNOSIS — R739 Hyperglycemia, unspecified: Secondary | ICD-10-CM | POA: Diagnosis not present

## 2017-10-21 DIAGNOSIS — Z6841 Body Mass Index (BMI) 40.0 and over, adult: Secondary | ICD-10-CM

## 2017-10-21 DIAGNOSIS — G500 Trigeminal neuralgia: Secondary | ICD-10-CM

## 2017-10-21 LAB — LIPID PANEL
CHOLESTEROL: 142 mg/dL (ref 0–200)
HDL: 45.5 mg/dL (ref >39.00)
LDL Cholesterol: 82 mg/dL (ref 0–99)
NonHDL: 96.63
Total CHOL/HDL Ratio: 3
Triglycerides: 75 mg/dL (ref 0.0–149.0)
VLDL: 15 mg/dL (ref 0.0–40.0)

## 2017-10-21 LAB — HEPATIC FUNCTION PANEL
ALK PHOS: 95 U/L (ref 39–117)
ALT: 25 U/L (ref 0–35)
AST: 20 U/L (ref 0–37)
Albumin: 3.9 g/dL (ref 3.5–5.2)
Bilirubin, Direct: 0.2 mg/dL (ref 0.0–0.3)
Total Bilirubin: 1.1 mg/dL (ref 0.2–1.2)
Total Protein: 6.9 g/dL (ref 6.0–8.3)

## 2017-10-21 LAB — BASIC METABOLIC PANEL
BUN: 30 mg/dL — ABNORMAL HIGH (ref 6–23)
CALCIUM: 9.2 mg/dL (ref 8.4–10.5)
CO2: 31 meq/L (ref 19–32)
CREATININE: 1.1 mg/dL (ref 0.40–1.20)
Chloride: 105 mEq/L (ref 96–112)
GFR: 53.44 mL/min — ABNORMAL LOW (ref >60.00)
GLUCOSE: 115 mg/dL — AB (ref 70–99)
Potassium: 4.5 mEq/L (ref 3.5–5.1)
Sodium: 140 mEq/L (ref 135–145)

## 2017-10-21 LAB — HEMOGLOBIN A1C: HEMOGLOBIN A1C: 6.2 % (ref 4.6–6.5)

## 2017-10-21 NOTE — Progress Notes (Signed)
Patient ID: Kristin James, female   DOB: 12/26/1955, 62 y.o.   MRN: [id number]   Subjective:    Patient ID: Kristin James, female    DOB: [birth year], 62 y.o.   MRN: [id number]  HPI  Patient here for a scheduled follow up.  She reports she feels better.  History of left CPA meningioma.  S/p GK Icon on 11/17/16 by Dr Arlan Organ and Dr Vallarie Mare.  Was experiencing left facial pain - trigeminal neuralgia.  This has resolved.  Off her pain medication for this.  Last evaluated 07/2017.  Felt stable and recommended f/u in one year with f/u MRI.  Overall she feels good.  Trying to stay active.  Just saw Dr Carloyn Manner for her back.  Stable.  No chest pain.  No sob.  No acid reflux.  No abdominal pain.  Bowels moving.  She reports her blood pressure on outside checks have been averaging. 138/70s.     Past Medical History:  Diagnosis Date  . 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 Hiatal hernia   . Hypercholesteremia   . Hyperglycemia   . Hypertension   . Osteoarthritis   . Peripheral arterial disease (HCC)    embolic disease   . Proteinuria   . Spinal stenosis at L4-L5 level   . Thrombophlebitis of posterior tibial vein ([place]) 06/01/2014  . Trigeminal neuralgia 08/2016   Past Surgical History:  Procedure Laterality Date  . AORTOGRAM    . BACK SURGERY  11/13/2014  . CHOLECYSTECTOMY    . COLONOSCOPY WITH PROPOFOL N/A 08/15/2016   Procedure: COLONOSCOPY WITH PROPOFOL;  Surgeon: Lollie Sails, MD;  Location: [facility] ENDOSCOPY;  Service: Endoscopy;  Laterality: N/A;  . KNEE ARTHROSCOPY    . LOWER EXTREMITY ANGIOGRAM     Family History  Problem Relation Age of Onset  . Hypertension Mother   . Stroke Father        3 strokes  . Hypertension Father   . Breast cancer Paternal Grandmother   . Colon cancer Neg Hx    Social History   Socioeconomic History  . Marital status: Married    Spouse name: None  . Number of children: 2  . Years of education: None  . Highest education level: None  Social Needs  .  Financial resource strain: None  . Food insecurity - worry: None  . Food insecurity - inability: None  . Transportation needs - medical: None  . Transportation needs - non-medical: None  Occupational History  . None  Tobacco Use  . Smoking status: Never Smoker  . Smokeless tobacco: Never Used  Substance and Sexual Activity  . Alcohol use: No    Alcohol/week: 0.0 oz  . Drug use: No  . Sexual activity: None  Other Topics Concern  . None  Social History Narrative  . None    Outpatient Encounter Medications as of 10/21/2017  Medication Sig  . amiodarone (PACERONE) 200 MG tablet take 1 tablet by mouth once daily  . atenolol (TENORMIN) 25 MG tablet take 1 tablet by mouth once daily  . atorvastatin (LIPITOR) 40 MG tablet take 1 tablet by mouth once daily  . diltiazem (CARDIZEM CD) 120 MG 24 hr capsule take 1 capsule by mouth once daily  . ELIQUIS 5 MG TABS tablet take 1 tablet by mouth twice a day  . losartan-hydrochlorothiazide (HYZAAR) 100-12.5 MG tablet take 1 tablet by mouth once daily  . losartan-hydrochlorothiazide (HYZAAR) 100-12.5 MG tablet take 1 tablet by mouth once  daily  . meloxicam (MOBIC) 7.5 MG tablet Take 7.5 mg by mouth daily.   . Oxcarbazepine (TRILEPTAL) 300 MG tablet Take 300 mg by mouth 3 (three) times daily.   . [DISCONTINUED] gabapentin (NEURONTIN) 100 MG capsule Take 1 capsule (100 mg total) by mouth 3 (three) times daily. (Patient not taking: Reported on 10/21/2017)  . [DISCONTINUED] gabapentin (NEURONTIN) 600 MG tablet Take 1 tablet (600 mg total) by mouth 3 (three) times daily. (Patient not taking: Reported on 10/21/2017)  . [DISCONTINUED] nystatin cream (MYCOSTATIN) Apply 1 application topically 2 (two) times daily. (Patient not taking: Reported on 10/21/2017)   No facility-administered encounter medications on file as of 10/21/2017.     Review of Systems  Constitutional: Negative for appetite change and unexpected weight change.  HENT: Negative for congestion  and sinus pressure.   Respiratory: Negative for cough, chest tightness and shortness of breath.   Cardiovascular: Negative for chest pain, palpitations and leg swelling.  Gastrointestinal: Negative for abdominal pain, diarrhea, nausea and vomiting.  Genitourinary: Negative for difficulty urinating and dysuria.  Musculoskeletal: Negative for joint swelling and myalgias.       Back - stable.    Skin: Negative for color change and rash.  Neurological: Negative for dizziness, light-headedness and headaches.  Psychiatric/Behavioral: Negative for agitation and dysphoric mood.       Objective:     Blood pressure rechecked by me:  142/78  Physical Exam  Constitutional: She appears well-developed and well-nourished. No distress.  HENT:  Nose: Nose normal.  Mouth/Throat: Oropharynx is clear and moist.  Neck: Neck supple. No thyromegaly present.  Cardiovascular: Normal rate and regular rhythm.  Pulmonary/Chest: Breath sounds normal. No respiratory distress. She has no wheezes.  Abdominal: Soft. Bowel sounds are normal. There is no tenderness.  Musculoskeletal: She exhibits no edema or tenderness.  Lymphadenopathy:    She has no cervical adenopathy.  Skin: No rash noted. No erythema.  Psychiatric: She has a normal mood and affect. Her behavior is normal.    BP (!) 142/78   Pulse (!) 52   Temp 98.3 F (36.8 C) (Oral)   Resp 16   Wt 206 lb 12.8 oz (93.8 kg)   SpO2 97%   BMI 41.77 kg/m  Wt Readings from Last 3 Encounters:  10/21/17 206 lb 12.8 oz (93.8 kg)  06/05/17 202 lb 12.8 oz (92 kg)  04/23/17 201 lb 8 oz (91.4 kg)     Lab Results  Component Value Date   WBC 6.1 02/26/2017   HGB 15.0 02/26/2017   HCT 43.7 02/26/2017   PLT 196.0 02/26/2017   GLUCOSE 115 (H) 10/21/2017   CHOL 142 10/21/2017   TRIG 75.0 10/21/2017   HDL 45.50 10/21/2017   LDLCALC 82 10/21/2017   ALT 25 10/21/2017   AST 20 10/21/2017   NA 140 10/21/2017   K 4.5 10/21/2017   CL 105 10/21/2017    CREATININE 1.10 10/21/2017   BUN 30 (H) 10/21/2017   CO2 31 10/21/2017   TSH 3.81 06/22/2017   INR 1.0 05/23/2014   HGBA1C 6.2 10/21/2017   MICROALBUR 35.5 (H) 04/08/2016       Assessment & Plan:   Problem List Items Addressed This Visit    Atrial fibrillation with RVR ([place])    Followed by cardiology.  Stable.        Relevant Orders   Ambulatory referral to Cardiology   Back pain    S/p back surgery.  Just evaluated by Dr Carloyn Manner.  Stable.  BMI 40.0-44.9, adult (HCC)    Discussed diet and exercise.  Follow.       Elevated hemoglobin (HCC)    Follow cbc.        Hypercholesterolemia    Low cholesterol diet and exercise.  On lipitor.  Follow lipid panel and liver function tests.        Relevant Orders   Hepatic function panel (Completed)   Lipid panel (Completed)   Hyperglycemia    Low carb diet and exercise.  Follow met b and a1c.        Relevant Orders   Hemoglobin A1c (Completed)   Hypertension    Blood pressure as outlined.  Elevated today and on last check.  She desires not to change her medication.  Wants to discuss with Dr Rockey Situ.  Schedule f/u appt with Dr Rockey Situ.        Relevant Orders   Basic metabolic panel (Completed)   Ambulatory referral to Cardiology   Meningioma [facility])    Diagnosed with cerebellopontine angle meningioma.  S/p gamma knife treatments.  Followed by Dr Vallarie Mare and Dr Arlan Organ.  Pain resolved.  Evaluated 07/2017.  Doing well.  Recommended f/u in one year with f/u MRI.       Occlusion of artery of lower extremity (HCC)    S/p angioplasty.  On eliquis.  Stable.  Follow.       Renal insufficiency    Creatinine has been stable.  Stay hydrated.  Avoid antiinflammatories.  Follow met b.       Trigeminal neuralgia of left side of face    Pain resolved.  Off gabapentin.  Follow.            [name], MD

## 2017-10-24 ENCOUNTER — Encounter: Payer: Self-pay | Admitting: Internal Medicine

## 2017-10-24 DIAGNOSIS — I739 Peripheral vascular disease, unspecified: Secondary | ICD-10-CM | POA: Insufficient documentation

## 2017-10-24 DIAGNOSIS — I70209 Unspecified atherosclerosis of native arteries of extremities, unspecified extremity: Secondary | ICD-10-CM | POA: Insufficient documentation

## 2017-10-24 NOTE — Assessment & Plan Note (Signed)
Blood pressure as outlined.  Elevated today and on last check.  She desires not to change her medication.  Wants to discuss with Dr Rockey Situ.  Schedule f/u appt with Dr Rockey Situ.

## 2017-10-24 NOTE — Assessment & Plan Note (Signed)
Discussed diet and exercise.  Follow.  

## 2017-10-24 NOTE — Assessment & Plan Note (Signed)
S/p angioplasty.  On eliquis.  Stable.  Follow.

## 2017-10-24 NOTE — Assessment & Plan Note (Signed)
Low cholesterol diet and exercise.  On lipitor.  Follow lipid panel and liver function tests.   

## 2017-10-24 NOTE — Assessment & Plan Note (Signed)
Creatinine has been stable.  Stay hydrated.  Avoid antiinflammatories.  Follow met b.

## 2017-10-24 NOTE — Assessment & Plan Note (Signed)
Followed by cardiology. Stable.   

## 2017-10-24 NOTE — Assessment & Plan Note (Signed)
S/p back surgery.  Just evaluated by Dr Carloyn Manner.  Stable.

## 2017-10-24 NOTE — Assessment & Plan Note (Signed)
Low carb diet and exercise.  Follow met b and a1c.   

## 2017-10-24 NOTE — Assessment & Plan Note (Signed)
Diagnosed with cerebellopontine angle meningioma.  S/p gamma knife treatments.  Followed by Dr Vallarie Mare and Dr Arlan Organ.  Pain resolved.  Evaluated 07/2017.  Doing well.  Recommended f/u in one year with f/u MRI.

## 2017-10-24 NOTE — Assessment & Plan Note (Signed)
Pain resolved.  Off gabapentin.  Follow.

## 2017-10-24 NOTE — Assessment & Plan Note (Signed)
Follow cbc.  

## 2017-11-10 ENCOUNTER — Other Ambulatory Visit: Payer: Self-pay | Admitting: Internal Medicine

## 2017-11-24 ENCOUNTER — Encounter: Payer: Self-pay | Admitting: *Deleted

## 2017-12-05 ENCOUNTER — Other Ambulatory Visit: Payer: Self-pay | Admitting: Internal Medicine

## 2017-12-23 ENCOUNTER — Other Ambulatory Visit: Payer: Self-pay | Admitting: Cardiovascular Disease

## 2018-01-02 ENCOUNTER — Other Ambulatory Visit: Payer: Self-pay | Admitting: Internal Medicine

## 2018-01-07 ENCOUNTER — Other Ambulatory Visit: Payer: Self-pay | Admitting: Cardiovascular Disease

## 2018-01-07 NOTE — Telephone Encounter (Signed)
Please review for refill, Thanks !  

## 2018-01-08 NOTE — Telephone Encounter (Signed)
Pt is a 62 yr old female that last saw Dr Rockey Situ on 03/24/17. Her weight noted was 93.8Kg on 10/21/17 and her serum creatine at that time was 1.10. Will refill her Eliquis 5mg  BID.

## 2018-01-16 IMAGING — MG MM DIGITAL SCREENING BILAT W/ CAD
4 series · 4 of 4 positions shown · non-contrast
Comparison: Previous exam(s).

CLINICAL DATA: Screening.

EXAM:
DIGITAL SCREENING BILATERAL MAMMOGRAM WITH CAD

[R MLO]
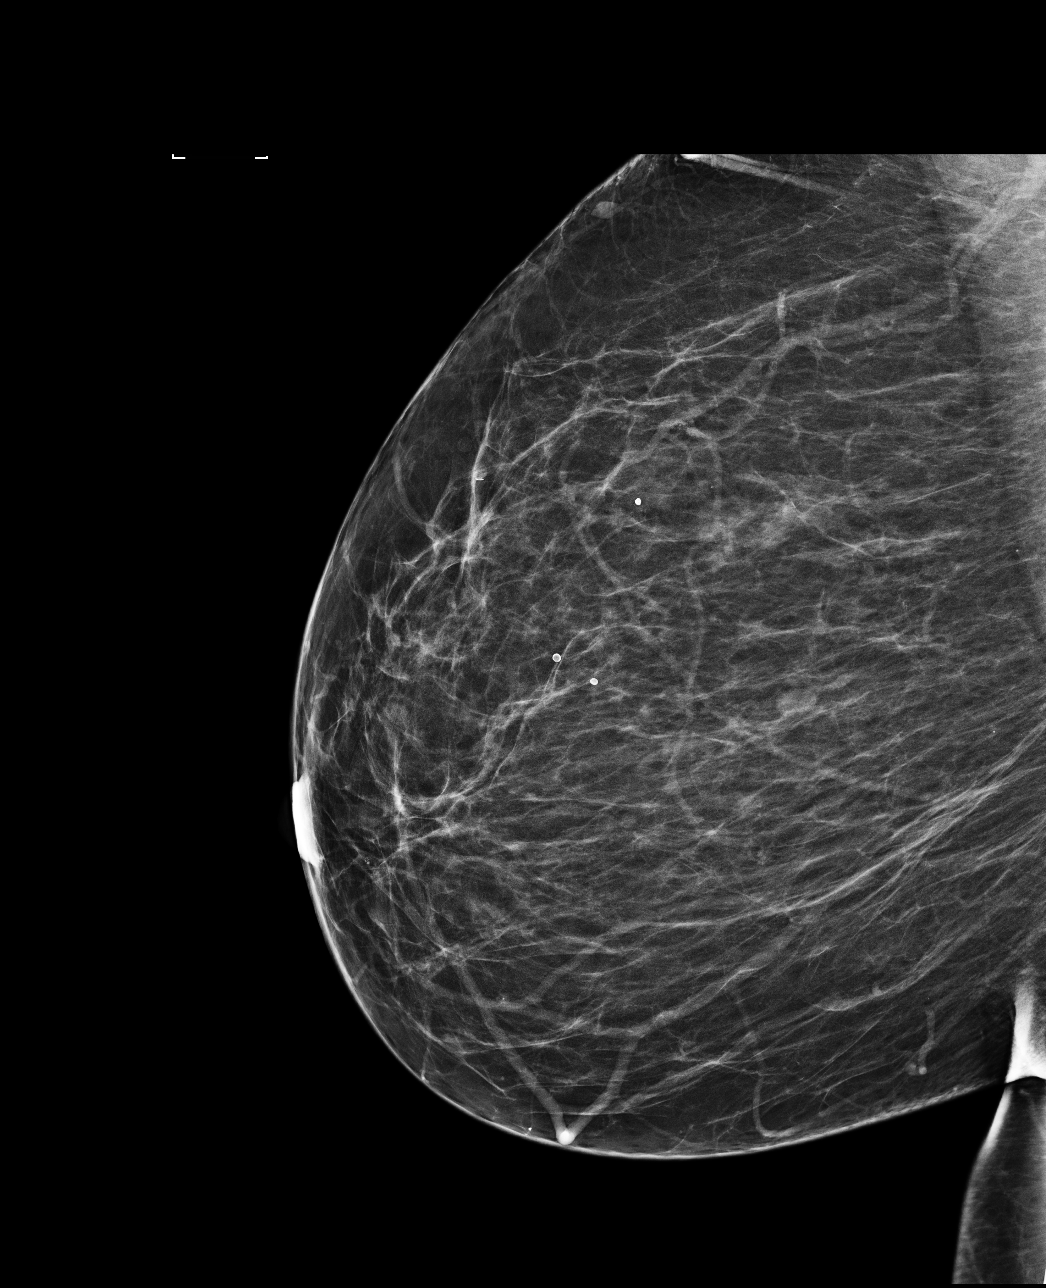

[L MLO]
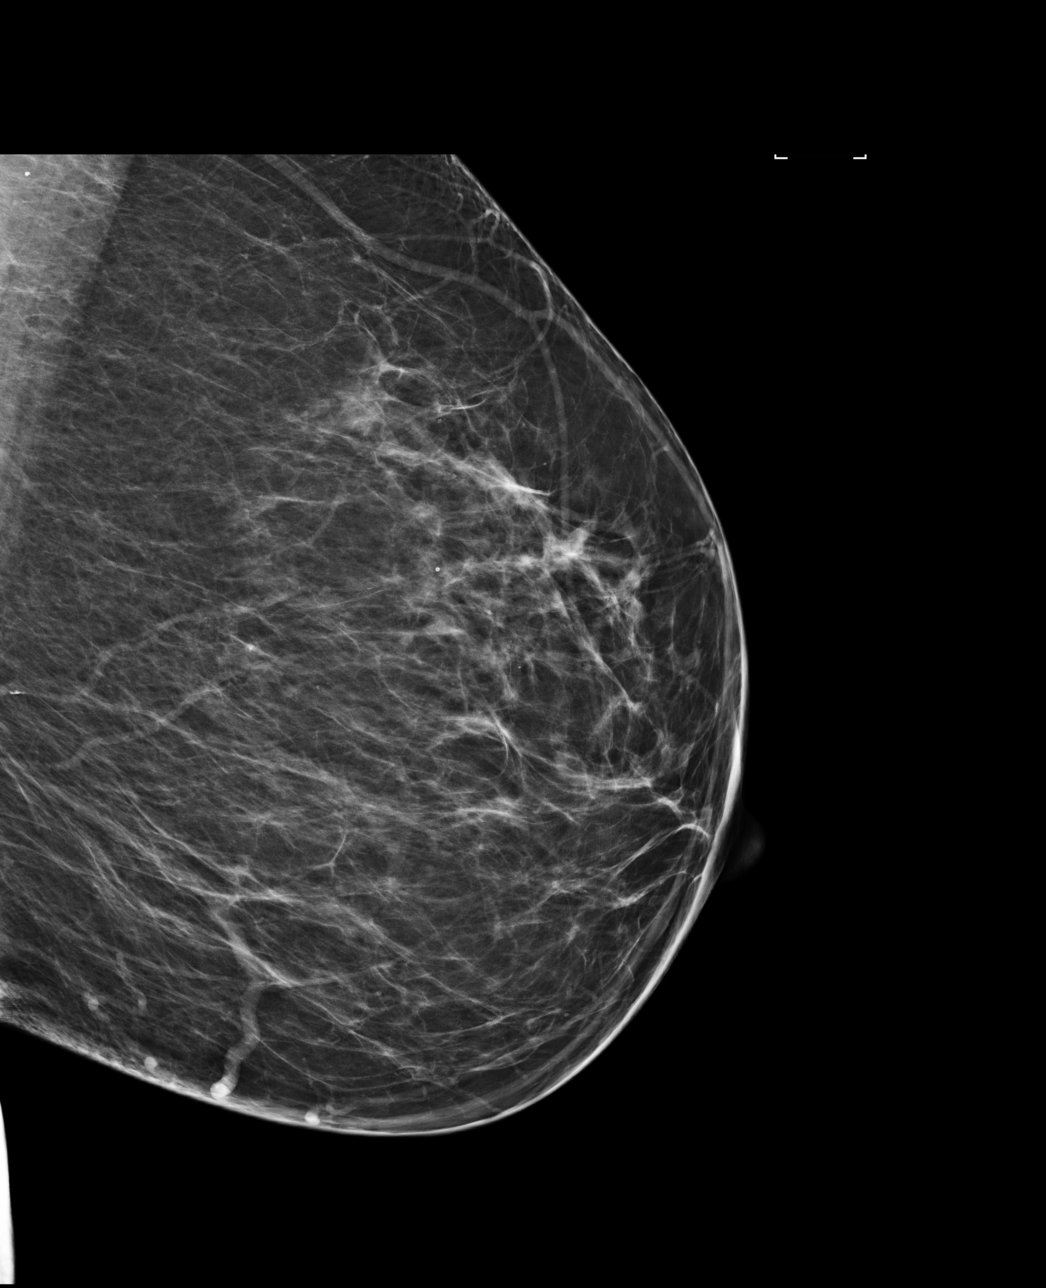

[R CC]
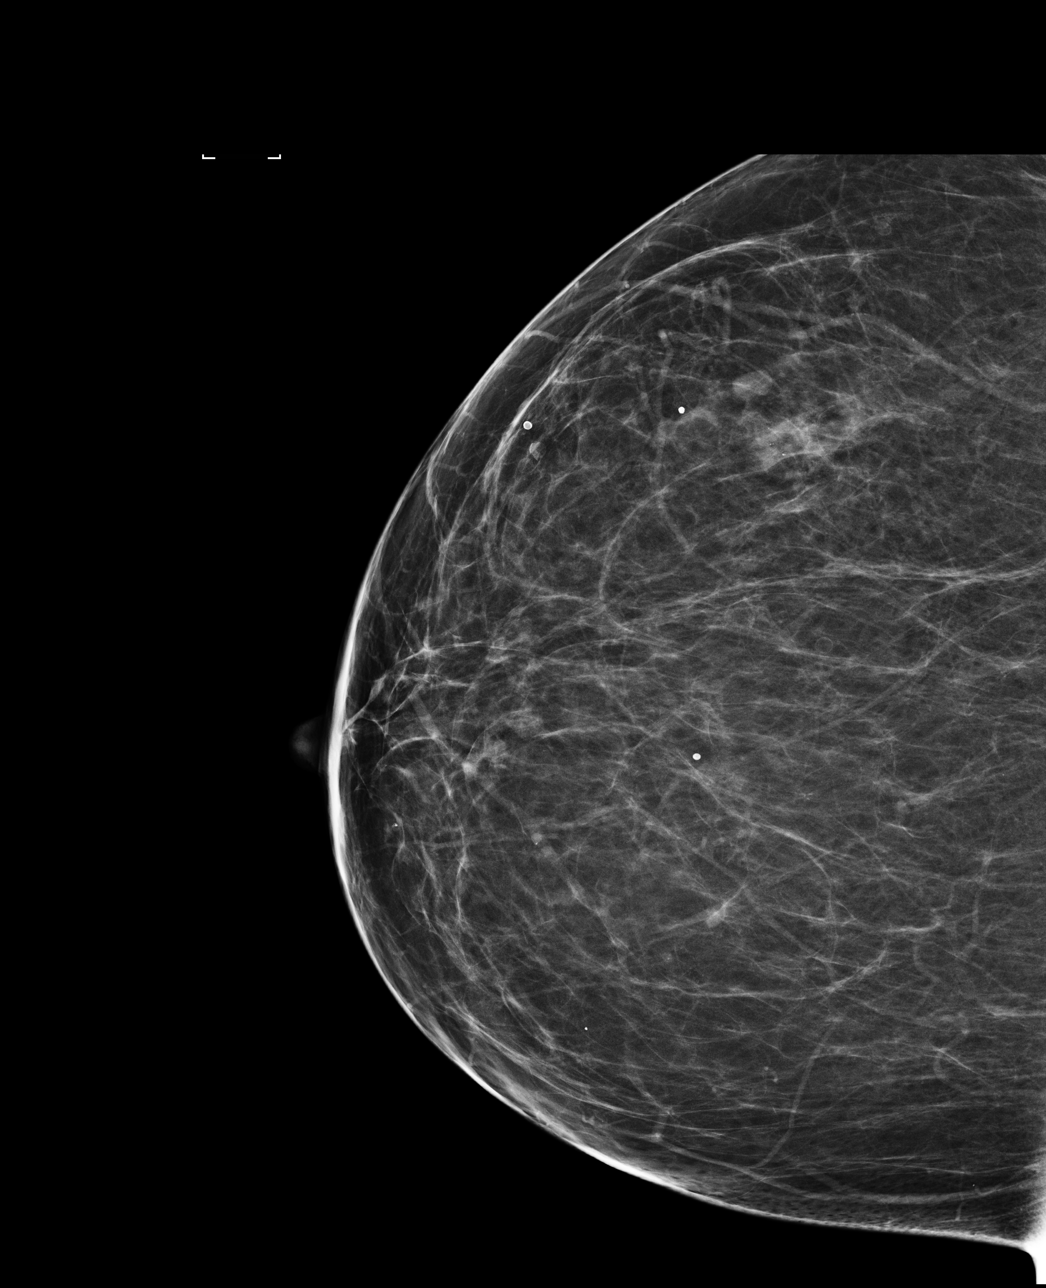

[L CC]
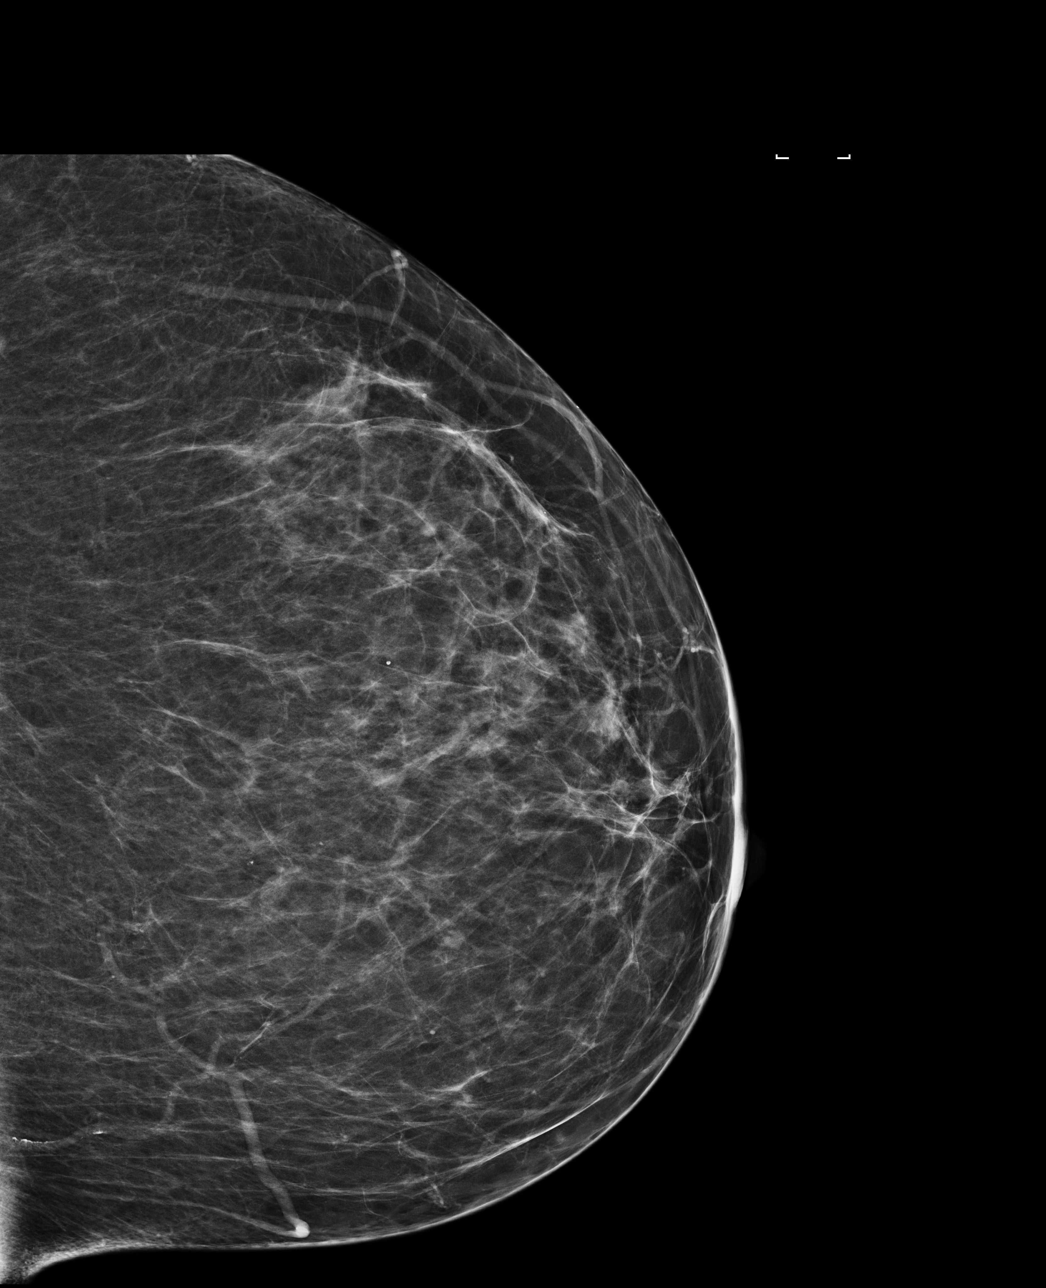

[4 of 4 positions shown; findings below may reference images not displayed]

ACR Breast Density Category b: There are scattered areas of
fibroglandular density.
FINDINGS: There are no findings suspicious for malignancy. Images were
processed with CAD.
IMPRESSION: No mammographic evidence of malignancy. A result letter of this
screening mammogram will be mailed directly to the patient.

RECOMMENDATION:
Screening mammogram in one year. (Code:AS-G-LCT)

BI-RADS CATEGORY  1: Negative.

## 2018-02-05 ENCOUNTER — Other Ambulatory Visit: Payer: Self-pay | Admitting: Internal Medicine

## 2018-02-10 ENCOUNTER — Other Ambulatory Visit: Payer: Self-pay | Admitting: Cardiovascular Disease

## 2018-03-26 ENCOUNTER — Other Ambulatory Visit: Payer: Self-pay | Admitting: Cardiovascular Disease

## 2018-04-03 ENCOUNTER — Other Ambulatory Visit: Payer: Self-pay | Admitting: Cardiovascular Disease

## 2018-04-05 NOTE — Telephone Encounter (Signed)
Refill Request.  

## 2018-04-11 NOTE — Progress Notes (Signed)
Cardiology Office Note  Date:  04/13/2018   ID:  Kristin James, DOB 1956/08/29, MRN [id number]  PCP:  Kristin Pheasant, MD   Chief Complaint  Patient presents with  . OTHER    12 month f/u no complaints today. Meds reviewed verbally with pt.    HPI:  Kristin James is a pleasant 62 year old woman with history of  paroxysmal atrial fibrillation,  HTN,  chronic back pain,  spinal stenosis,  obesity,   Trigeminal neuralgia hospital in September 2015 for acute left lower extremity arterial ischemia secondary to thrombus. She presents for routine followup of her atrial fibrillation  Weight up over the past year Blood pressure at  Home 120/78  Elevated today but she was rushing  Back issue, balance issue, No pain in her back No sciatica  Lab work reviewed HBA1C 6.2 Total chol 142   last year developed tumor pressing on her fifth cranial nerve Had gamma knife treatment  Pain has dramatically improved after treatment  Denies having tachycardia or palpitations concerning for paroxysmal atrial fibrillation Denies any chest pain or shortness of breath concerning for angina  EKG personally reviewed by myself on todays visit hows Normal sinus rhythm with rate 61 bpm no significant ST or T-wave changes   other past medical history reviewed   Trigeminal neuralgia x 6 months. She has seen several specialists, Including prednisone, Neurontin, trileptal among others  Severe pain left side of face, shocking pain,   Other past medical history back surgery March 2016, lumbar region Prior office visit, EKG documented atrial fibrillation. Started on amiodarone, diltiazem, beta blocker and she converted to normal sinus rhythm. Atenolol dose decreased secondary to bradycardia  admitted to the hospital on May 21 2014 with acute pain in her left leg. She had no distal pulses. Her creatinine was 1.99 which improved to 1.0 with fluids. She underwent angiogram of the left lower extremity  with thrombolysis with TPA of the left pill, tibial peroneal trunk, peroneal arteries and distally, thrombectomy of the vessel with restoration of flow. she does not have any sleep apnea, does have occasional snoring  Stress test was done in the hospital for chest pain. This showed no ischemia. Ejection fraction 67% EKG in the hospital showed normal sinus rhythm with rate 74 beats per minute. 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  PSH:    Past Surgical History:  Procedure Laterality Date  . AORTOGRAM    . BACK SURGERY  11/13/2014  . CHOLECYSTECTOMY    . COLONOSCOPY WITH PROPOFOL N/A 08/15/2016   Procedure: COLONOSCOPY WITH PROPOFOL;  Surgeon: Lollie Sails, MD;  Location: [facility] ENDOSCOPY;  Service: Endoscopy;  Laterality: N/A;  . KNEE ARTHROSCOPY    . LOWER EXTREMITY ANGIOGRAM      Current Outpatient Medications  Medication Sig Dispense Refill  . amiodarone (PACERONE) 200 MG tablet take 1 tablet by mouth once daily 90 tablet 3  . atenolol (TENORMIN) 25 MG tablet TAKE 1 TABLET BY MOUTH EVERY DAY 30 tablet 0  . atorvastatin (LIPITOR) 40 MG tablet TAKE 1 TABLET BY MOUTH ONCE DAILY 90 tablet 0  . diltiazem (CARDIZEM CD) 120 MG 24 hr capsule take 1 capsule by mouth once daily 90 capsule 3  . ELIQUIS 5 MG TABS tablet TAKE 1  TABLET BY MOUTH TWICE A DAY 180 tablet 0  . losartan-hydrochlorothiazide (HYZAAR) 100-12.5 MG tablet take 1 tablet by mouth once daily 30 tablet 1  . meloxicam (MOBIC) 7.5 MG tablet Take 7.5 mg by mouth daily.   0   No current facility-administered medications for this visit.      Allergies:   Patient has no known allergies.   Social History:  The patient   reports that she has never smoked. She has never used smokeless tobacco. She reports that she drinks alcohol. She reports that she does not use drugs.   Family History:   family history includes Breast cancer in her paternal grandmother; Hypertension in her father and mother; Stroke in her father.    Review of Systems: Review of Systems  Constitutional: Negative.   HENT:       Severe left-sided facial pain  Respiratory: Negative.   Cardiovascular: Negative.   Gastrointestinal: Negative.   Musculoskeletal: Negative.   Neurological: Negative.        Balance issues  Psychiatric/Behavioral: Negative.   All other systems reviewed and are negative.    PHYSICAL EXAM: VS:  BP (!) 168/80 (BP Location: Left Arm, Patient Position: Sitting, Cuff Size: Large)   Pulse 61   Ht 5' (1.524 m)   Wt 211 lb 12 oz (96 kg)   BMI 41.35 kg/m  , BMI Body mass index is 41.35 kg/m. Constitutional:  oriented to person, place, and time. No distress. obese HENT:  Head: Normocephalic and atraumatic.  Eyes:  no discharge. No scleral icterus.  Neck: Normal range of motion. Neck supple. No JVD present.  Cardiovascular: Normal rate, regular rhythm, normal heart sounds and intact distal pulses. Exam reveals no gallop and no friction rub. No edema No murmur heard. Pulmonary/Chest: Effort normal and breath sounds normal. No stridor. No respiratory distress.  no wheezes.  no rales.  no tenderness.  Abdominal: Soft.  no distension.  no tenderness.  Musculoskeletal: Normal range of motion.  no  tenderness or deformity.  Neurological:  normal muscle tone. Coordination normal. No atrophy Skin: Skin is warm and dry. No rash noted. not diaphoretic.  Psychiatric:  normal mood and affect. behavior is normal. Thought content normal.    Recent Labs: 06/22/2017: TSH 3.81 10/21/2017: ALT 25; BUN 30; Creatinine, Ser 1.10; Potassium 4.5; Sodium 140    Lipid Panel Lab Results  Component Value Date   CHOL 142 10/21/2017    HDL 45.50 10/21/2017   LDLCALC 82 10/21/2017   TRIG 75.0 10/21/2017      Wt Readings from Last 3 Encounters:  04/13/18 211 lb 12 oz (96 kg)  10/21/17 206 lb 12.8 oz (93.8 kg)  06/05/17 202 lb 12.8 oz (92 kg)     ASSESSMENT AND PLAN:  Essential hypertension Blood pressure elevated on today's visit  she reports is well controlled at home typically in the 081 systolic range  Hypercholesterolemia Encouraged her to stay on her Lipitor Cholesterol at goal  Recommended lifestyle modification,  dietary changes and weight loss  Morbid obesity (HCC) Recommended low carbohydrate diet Walking program for balance and weight loss  Atrial fibrillation with RVR ([place]) - Plan: EKG 12-Lead  denies any symptoms concerning for arrhythmia Continue amiodarone and atenolol in the morning Cardizem and evening  Trigeminal neuralgia of left side of face Has follow-up MRI in several months time Pain has resolved  Back pain/OA Ok to take meloxicam   Total encounter time more than 25 minutes  Greater than 50% was  spent in counseling and coordination of care with the patient   Disposition:   F/U  12 months   Orders Placed This Encounter  Procedures  . EKG 12-Lead     Signed, Esmond Plants, M.D., Ph.D. 04/13/2018  [place], [place]

## 2018-04-13 ENCOUNTER — Ambulatory Visit: Payer: BLUE CROSS/BLUE SHIELD | Admitting: Cardiovascular Disease

## 2018-04-13 ENCOUNTER — Encounter: Payer: Self-pay | Admitting: Cardiovascular Disease

## 2018-04-13 VITALS — BP 168/80 | HR 61 | Ht 60.0 in | Wt 211.8 lb

## 2018-04-13 DIAGNOSIS — E7800 Pure hypercholesterolemia, unspecified: Secondary | ICD-10-CM | POA: Diagnosis not present

## 2018-04-13 DIAGNOSIS — I4891 Unspecified atrial fibrillation: Secondary | ICD-10-CM | POA: Diagnosis not present

## 2018-04-13 DIAGNOSIS — R079 Chest pain, unspecified: Secondary | ICD-10-CM | POA: Diagnosis not present

## 2018-04-13 DIAGNOSIS — I10 Essential (primary) hypertension: Secondary | ICD-10-CM

## 2018-04-13 DIAGNOSIS — I739 Peripheral vascular disease, unspecified: Secondary | ICD-10-CM

## 2018-04-13 NOTE — Patient Instructions (Signed)

## 2018-04-19 ENCOUNTER — Other Ambulatory Visit: Payer: Self-pay | Admitting: Internal Medicine

## 2018-04-19 DIAGNOSIS — Z1231 Encounter for screening mammogram for malignant neoplasm of breast: Secondary | ICD-10-CM

## 2018-05-27 ENCOUNTER — Ambulatory Visit
Admission: RE | Admit: 2018-05-27 | Discharge: 2018-05-27 | Disposition: A | Discharge status: Home / Self Care | Payer: BLUE CROSS/BLUE SHIELD | Source: Ambulatory Visit | Attending: Internal Medicine | Admitting: Internal Medicine

## 2018-05-27 DIAGNOSIS — Z1231 Encounter for screening mammogram for malignant neoplasm of breast: Secondary | ICD-10-CM | POA: Diagnosis not present

## 2018-06-03 ENCOUNTER — Other Ambulatory Visit: Payer: Self-pay | Admitting: Cardiovascular Disease

## 2018-06-21 ENCOUNTER — Other Ambulatory Visit: Payer: Self-pay | Admitting: Internal Medicine

## 2018-06-21 ENCOUNTER — Other Ambulatory Visit: Payer: Self-pay | Admitting: Cardiovascular Disease

## 2018-06-21 NOTE — Telephone Encounter (Signed)
Refill Request.  

## 2018-07-09 DIAGNOSIS — G500 Trigeminal neuralgia: Secondary | ICD-10-CM | POA: Diagnosis not present

## 2018-07-09 DIAGNOSIS — G9389 Other specified disorders of brain: Secondary | ICD-10-CM | POA: Diagnosis not present

## 2018-07-09 DIAGNOSIS — D320 Benign neoplasm of cerebral meninges: Secondary | ICD-10-CM | POA: Diagnosis not present

## 2018-07-09 DIAGNOSIS — Z7901 Long term (current) use of anticoagulants: Secondary | ICD-10-CM | POA: Diagnosis not present

## 2018-07-09 DIAGNOSIS — Z923 Personal history of irradiation: Secondary | ICD-10-CM | POA: Diagnosis not present

## 2018-07-09 DIAGNOSIS — I69398 Other sequelae of cerebral infarction: Secondary | ICD-10-CM | POA: Diagnosis not present

## 2018-07-09 DIAGNOSIS — I4891 Unspecified atrial fibrillation: Secondary | ICD-10-CM | POA: Diagnosis not present

## 2018-07-09 DIAGNOSIS — D329 Benign neoplasm of meninges, unspecified: Secondary | ICD-10-CM | POA: Diagnosis not present

## 2018-07-12 ENCOUNTER — Telehealth: Payer: Self-pay | Admitting: Internal Medicine

## 2018-07-12 NOTE — Telephone Encounter (Signed)
With her being on eliquis and [place] following her kidney function, would hold on taking mobic.  She is overdue a f/u appt with me.  Need to schedule.

## 2018-07-12 NOTE — Telephone Encounter (Signed)
Please advise 

## 2018-07-12 NOTE — Telephone Encounter (Signed)
Copied from [place] [id number]. Topic: Quick Communication - See Telephone Encounter >> Jul 12, 2018 12:57 PM Conception Chancy, NT wrote: CRM for notification. See Telephone encounter for: 07/12/18.  Patient is calling and is needing a refill on meloxicam (MOBIC) 7.5 MG tablet she states her Arthritis is acting up and she said she was last taking 15mg . She states her heart doctor told her it was okay to start this back. I informed patient it has not been prescribed since 2017 and I informed patient this may not be called in without being seen first. Please advise.  [id number] - [place], [place] - [place] [place] [address] Phone: [phone] Fax: [phone]

## 2018-07-13 NOTE — Telephone Encounter (Signed)
Pt scheduled for 11/14

## 2018-07-15 ENCOUNTER — Ambulatory Visit: Payer: BLUE CROSS/BLUE SHIELD | Admitting: Internal Medicine

## 2018-07-15 ENCOUNTER — Encounter: Payer: Self-pay | Admitting: Internal Medicine

## 2018-07-15 VITALS — BP 166/88 | HR 59 | Temp 97.9°F | Resp 18 | Wt 211.2 lb

## 2018-07-15 DIAGNOSIS — I10 Essential (primary) hypertension: Secondary | ICD-10-CM | POA: Diagnosis not present

## 2018-07-15 DIAGNOSIS — N289 Disorder of kidney and ureter, unspecified: Secondary | ICD-10-CM

## 2018-07-15 DIAGNOSIS — D329 Benign neoplasm of meninges, unspecified: Secondary | ICD-10-CM

## 2018-07-15 DIAGNOSIS — E7800 Pure hypercholesterolemia, unspecified: Secondary | ICD-10-CM

## 2018-07-15 DIAGNOSIS — I4891 Unspecified atrial fibrillation: Secondary | ICD-10-CM

## 2018-07-15 DIAGNOSIS — M5440 Lumbago with sciatica, unspecified side: Secondary | ICD-10-CM

## 2018-07-15 DIAGNOSIS — Z23 Encounter for immunization: Secondary | ICD-10-CM | POA: Diagnosis not present

## 2018-07-15 DIAGNOSIS — R739 Hyperglycemia, unspecified: Secondary | ICD-10-CM | POA: Diagnosis not present

## 2018-07-15 DIAGNOSIS — Z6841 Body Mass Index (BMI) 40.0 and over, adult: Secondary | ICD-10-CM

## 2018-07-15 LAB — BASIC METABOLIC PANEL
BUN: 31 mg/dL — AB (ref 6–23)
CHLORIDE: 103 meq/L (ref 96–112)
CO2: 29 meq/L (ref 19–32)
CREATININE: 1.37 mg/dL — AB (ref 0.40–1.20)
Calcium: 9.7 mg/dL (ref 8.4–10.5)
GFR: 41.38 mL/min — ABNORMAL LOW (ref >60.00)
Glucose, Bld: 119 mg/dL — ABNORMAL HIGH (ref 70–99)
Potassium: 4.2 mEq/L (ref 3.5–5.1)
Sodium: 140 mEq/L (ref 135–145)

## 2018-07-15 LAB — HEPATIC FUNCTION PANEL
ALBUMIN: 4.3 g/dL (ref 3.5–5.2)
ALK PHOS: 97 U/L (ref 39–117)
ALT: 28 U/L (ref 0–35)
AST: 22 U/L (ref 0–37)
BILIRUBIN TOTAL: 0.8 mg/dL (ref 0.2–1.2)
Bilirubin, Direct: 0.2 mg/dL (ref 0.0–0.3)
Total Protein: 7.4 g/dL (ref 6.0–8.3)

## 2018-07-15 LAB — CBC WITH DIFFERENTIAL/PLATELET
Basophils Absolute: 0.1 10*3/uL (ref 0.0–0.1)
Basophils Relative: 1.2 % (ref 0.0–3.0)
EOS PCT: 0.9 % (ref 0.0–5.0)
Eosinophils Absolute: 0.1 10*3/uL (ref 0.0–0.7)
HCT: 46.1 % — ABNORMAL HIGH (ref 36.0–46.0)
Hemoglobin: 15.7 g/dL — ABNORMAL HIGH (ref 12.0–15.0)
LYMPHS ABS: 1.6 10*3/uL (ref 0.7–4.0)
Lymphocytes Relative: 27.6 % (ref 12.0–46.0)
MCHC: 34 g/dL (ref 30.0–36.0)
MCV: 92.2 fl (ref 78.0–100.0)
Monocytes Absolute: 0.8 10*3/uL (ref 0.1–1.0)
Monocytes Relative: 13.3 % — ABNORMAL HIGH (ref 3.0–12.0)
NEUTROS PCT: 57 % (ref 43.0–77.0)
Neutro Abs: 3.3 10*3/uL (ref 1.4–7.7)
Platelets: 217 10*3/uL (ref 150.0–400.0)
RBC: 5 Mil/uL (ref 3.87–5.11)
RDW: 13.4 % (ref 11.5–15.5)
WBC: 5.8 10*3/uL (ref 4.0–10.5)

## 2018-07-15 LAB — LIPID PANEL
CHOLESTEROL: 153 mg/dL (ref 0–200)
HDL: 47.4 mg/dL (ref >39.00)
LDL Cholesterol: 84 mg/dL (ref 0–99)
NONHDL: 105.36
Total CHOL/HDL Ratio: 3
Triglycerides: 107 mg/dL (ref 0.0–149.0)
VLDL: 21.4 mg/dL (ref 0.0–40.0)

## 2018-07-15 LAB — TSH: TSH: 1.04 u[IU]/mL (ref 0.35–4.50)

## 2018-07-15 LAB — HEMOGLOBIN A1C: HEMOGLOBIN A1C: 6.1 % (ref 4.6–6.5)

## 2018-07-15 MED ORDER — HYDROCHLOROTHIAZIDE 25 MG PO TABS: 25.0000 mg | ORAL_TABLET | Freq: Every day | ORAL | 0 refills | Status: DC

## 2018-07-15 NOTE — Progress Notes (Signed)
Patient ID: Kristin James, female   DOB: 08/27/1956, 62 y.o.   MRN: [id number]   Subjective:    Patient ID: Kristin James, female    DOB: 23-Feb-1956, 62 y.o.   MRN: [id number]  HPI  Patient here as a work in with concerns regarding increased back/joint pain.  She has had issues with her back for a while. Previously on meloxicam.  Has been off now for 1-2 years.  States she is having increased joint pain.  Taking tylenol (6-8 per day).  Not helping.  Previously on gabapentin with her facial pain. Did not feel helped.  Discussed her blood pressure.  Elevated.  Also discussed her kidney function and need to avoid antiinflammatories if reduced kidney function.  States she needs something to help with pain.  No chest pain.  No sob.  No acid reflux.  No abdominal pain.  Bowels moving.  States her blood pressures are averaging 272-536U systolic readings.     Past Medical History:  Diagnosis Date  . Embolus to lower extremity ([place])   . Hiatal hernia   . Hypercholesteremia   . Hyperglycemia   . Hypertension   . Osteoarthritis   . Peripheral arterial disease (HCC)    embolic disease   . Proteinuria   . Spinal stenosis at L4-L5 level   . Thrombophlebitis of posterior tibial vein ([place]) 06/01/2014  . Trigeminal neuralgia 08/2016   Past Surgical History:  Procedure Laterality Date  . AORTOGRAM    . BACK SURGERY  11/13/2014  . CHOLECYSTECTOMY    . COLONOSCOPY WITH PROPOFOL N/A 08/15/2016   Procedure: COLONOSCOPY WITH PROPOFOL;  Surgeon: Lollie Sails, MD;  Location: [facility] ENDOSCOPY;  Service: Endoscopy;  Laterality: N/A;  . KNEE ARTHROSCOPY    . LOWER EXTREMITY ANGIOGRAM     Family History  Problem Relation Age of Onset  . Hypertension Mother   . Stroke Father        3 strokes  . Hypertension Father   . Breast cancer Paternal Grandmother   . Colon cancer Neg Hx    Social History   Socioeconomic History  . Marital status: Married    Spouse name: Not on file  . Number of children: 2    . Years of education: Not on file  . Highest education level: Not on file  Occupational History  . Not on file  Social Needs  . Financial resource strain: Not on file  . Food insecurity:    Worry: Not on file    Inability: Not on file  . Transportation needs:    Medical: Not on file    Non-medical: Not on file  Tobacco Use  . Smoking status: Never Smoker  . Smokeless tobacco: Never Used  Substance and Sexual Activity  . Alcohol use: Yes    Alcohol/week: 0.0 standard drinks    Comment: occ. wine  . Drug use: No  . Sexual activity: Not on file  Lifestyle  . Physical activity:    Days per week: Not on file    Minutes per session: Not on file  . Stress: Not on file  Relationships  . Social connections:    Talks on phone: Not on file    Gets together: Not on file    Attends religious service: Not on file    Active member of club or organization: Not on file    Attends meetings of clubs or organizations: Not on file    Relationship status: Not on file  Other Topics Concern  . Not on file  Social History Narrative  . Not on file    Outpatient Encounter Medications as of 07/15/2018  Medication Sig  . amiodarone (PACERONE) 200 MG tablet TAKE 1 TABLET BY MOUTH ONCE DAILY  . atenolol (TENORMIN) 25 MG tablet TAKE 1 TABLET BY MOUTH EVERY DAY  . atorvastatin (LIPITOR) 40 MG tablet TAKE 1 TABLET BY MOUTH ONCE DAILY  . diltiazem (CARDIZEM CD) 120 MG 24 hr capsule take 1 capsule by mouth once daily  . ELIQUIS 5 MG TABS tablet TAKE 1 TABLET BY MOUTH TWICE A DAY  . hydrochlorothiazide (HYDRODIURIL) 25 MG tablet Take 1 tablet (25 mg total) by mouth daily.  Marland Kitchen losartan-hydrochlorothiazide (HYZAAR) 100-12.5 MG tablet take 1 tablet by mouth once daily  . losartan-hydrochlorothiazide (HYZAAR) 100-12.5 MG tablet TAKE 1 TABLET BY MOUTH EVERY DAY  . meloxicam (MOBIC) 7.5 MG tablet Take 7.5 mg by mouth daily.    No facility-administered encounter medications on file as of 07/15/2018.      Review of Systems  Constitutional: Negative for appetite change and unexpected weight change.  HENT: Negative for congestion and sinus pressure.   Respiratory: Negative for cough, chest tightness and shortness of breath.   Cardiovascular: Negative for chest pain, palpitations and leg swelling.  Gastrointestinal: Negative for abdominal pain, diarrhea, nausea and vomiting.  Genitourinary: Negative for difficulty urinating and dysuria.  Musculoskeletal: Negative for joint swelling.       Joint aches as outlined.    Skin: Negative for color change and rash.  Neurological: Negative for dizziness, light-headedness and headaches.  Psychiatric/Behavioral: Negative for agitation and dysphoric mood.       Objective:    Physical Exam  Constitutional: She appears well-developed and well-nourished. No distress.  HENT:  Nose: Nose normal.  Mouth/Throat: Oropharynx is clear and moist.  Neck: Neck supple. No thyromegaly present.  Cardiovascular: Normal rate.  Rate controlled.    Pulmonary/Chest: Breath sounds normal. No respiratory distress. She has no wheezes.  Abdominal: Soft. Bowel sounds are normal. There is no tenderness.  Musculoskeletal: She exhibits no edema or tenderness.  Lymphadenopathy:    She has no cervical adenopathy.  Skin: No rash noted. No erythema.  Psychiatric: She has a normal mood and affect. Her behavior is normal.    BP (!) 166/88 (BP Location: Left Arm, Patient Position: Sitting, Cuff Size: Large)   Pulse (!) 59   Temp 97.9 F (36.6 C) (Oral)   Resp 18   Wt 211 lb 3.2 oz (95.8 kg)   SpO2 96%   BMI 41.25 kg/m  Wt Readings from Last 3 Encounters:  07/15/18 211 lb 3.2 oz (95.8 kg)  04/13/18 211 lb 12 oz (96 kg)  10/21/17 206 lb 12.8 oz (93.8 kg)     Lab Results  Component Value Date   WBC 5.8 07/15/2018   HGB 15.7 (H) 07/15/2018   HCT 46.1 (H) 07/15/2018   PLT 217.0 07/15/2018   GLUCOSE 119 (H) 07/15/2018   CHOL 153 07/15/2018   TRIG 107.0  07/15/2018   HDL 47.40 07/15/2018   LDLCALC 84 07/15/2018   ALT 28 07/15/2018   AST 22 07/15/2018   NA 140 07/15/2018   K 4.2 07/15/2018   CL 103 07/15/2018   CREATININE 1.37 (H) 07/15/2018   BUN 31 (H) 07/15/2018   CO2 29 07/15/2018   TSH 1.04 07/15/2018   INR 1.0 05/23/2014   HGBA1C 6.1 07/15/2018   MICROALBUR 35.5 (H) 04/08/2016    Mm  3d Screen Breast Bilateral  Result Date: 05/27/2018 CLINICAL DATA:  Screening. EXAM: DIGITAL SCREENING BILATERAL MAMMOGRAM WITH TOMO AND CAD COMPARISON:  Previous exam(s). ACR Breast Density Category b: There are scattered areas of fibroglandular density. FINDINGS: There are no findings suspicious for malignancy. Images were processed with CAD. IMPRESSION: No mammographic evidence of malignancy. A result letter of this screening mammogram will be mailed directly to the patient. RECOMMENDATION: Screening mammogram in one year. (Code:SM-B-01Y) BI-RADS CATEGORY  1: Negative. Electronically Signed   By: Dorise Bullion III M.D   On: 05/27/2018 12:35       Assessment & Plan:   Problem List Items Addressed This Visit    Atrial fibrillation with RVR ([place])    Followed by cardiology.  Rated controlled.  Stable.        Relevant Medications   hydrochlorothiazide (HYDRODIURIL) 25 MG tablet   Back pain    S/p back surgery.  Has seen Dr Carloyn Manner.  Increased pain and joint pain.  Discussed concern regarding antiinflammatories.  Check metabolic panel.  She is using tylenol.        BMI 40.0-44.9, adult (HCC)    Discussed diet and exercise.  Follow.        Hypercholesterolemia    On lipitor.  Low cholesterol diet and exercise.  Follow lipid panel and liver function tests.        Relevant Medications   hydrochlorothiazide (HYDRODIURIL) 25 MG tablet   Other Relevant Orders   Hepatic function panel (Completed)   Lipid panel (Completed)   Hyperglycemia    Low carb diet and exercise.  Follow met b and a1c.        Relevant Orders   Hemoglobin A1c (Completed)    Hypertension    Blood pressure elevated.  Discussed adding medication.  She declines.  Discussed trying to increase the hctz portion of her losartan/hctz (to 61m).  She agreed to this.  Follow pressures.  Follow metabolic panel.        Relevant Medications   hydrochlorothiazide (HYDRODIURIL) 25 MG tablet   Other Relevant Orders   CBC with Differential/Platelet (Completed)   TSH (Completed)   Meningioma (HCC)    Diagnosed with cerebellopontine angle meningioma.  S/p gamma knife treatments.  Followed by Dr CVallarie Mareand Dr LArlan Organ  Pain resolved.  Just evaluated.  States stable.  Follow.        Renal insufficiency - Primary    Discussed concern regarding her taking meloxicam given renal function.  Recheck today.  Stay hydrated.        Relevant Orders   Basic metabolic panel (Completed)    Other Visit Diagnoses    Need for immunization against influenza       Relevant Orders   Flu Vaccine QUAD 36+ mos IM (Completed)       CEinar Pheasant MD

## 2018-07-15 NOTE — Patient Instructions (Signed)
Increase HCTZ to 25 mg. You can take 2 of your 12.5mg  until you run out. Your Losartan dose stays the same. We will send in new combination dose once it is no longer on back order with the pharmacy.

## 2018-07-18 ENCOUNTER — Encounter: Payer: Self-pay | Admitting: Internal Medicine

## 2018-07-18 NOTE — Assessment & Plan Note (Signed)
On lipitor.  Low cholesterol diet and exercise.  Follow lipid panel and liver function tests.   

## 2018-07-18 NOTE — Assessment & Plan Note (Signed)
S/p back surgery.  Has seen Dr Carloyn Manner.  Increased pain and joint pain.  Discussed concern regarding antiinflammatories.  Check metabolic panel.  She is using tylenol.

## 2018-07-18 NOTE — Assessment & Plan Note (Signed)
Diagnosed with cerebellopontine angle meningioma.  S/p gamma knife treatments.  Followed by Dr Vallarie Mare and Dr Arlan Organ.  Pain resolved.  Just evaluated.  States stable.  Follow.

## 2018-07-18 NOTE — Assessment & Plan Note (Signed)
Blood pressure elevated.  Discussed adding medication.  She declines.  Discussed trying to increase the hctz portion of her losartan/hctz (to 25mg ).  She agreed to this.  Follow pressures.  Follow metabolic panel.

## 2018-07-18 NOTE — Assessment & Plan Note (Signed)
Followed by cardiology.  Rated controlled.  Stable.

## 2018-07-18 NOTE — Assessment & Plan Note (Signed)
Low carb diet and exercise.  Follow met b and a1c.

## 2018-07-18 NOTE — Assessment & Plan Note (Signed)
Discussed diet and exercise.  Follow.  

## 2018-07-18 NOTE — Assessment & Plan Note (Signed)
Discussed concern regarding her taking meloxicam given renal function.  Recheck today.  Stay hydrated.

## 2018-07-19 ENCOUNTER — Other Ambulatory Visit: Payer: Self-pay | Admitting: Internal Medicine

## 2018-07-19 ENCOUNTER — Encounter: Payer: Self-pay | Admitting: Internal Medicine

## 2018-07-19 DIAGNOSIS — R944 Abnormal results of kidney function studies: Secondary | ICD-10-CM

## 2018-07-19 NOTE — Progress Notes (Signed)
Order placed for f/u met b.  

## 2018-07-20 ENCOUNTER — Telehealth: Payer: Self-pay

## 2018-07-20 NOTE — Telephone Encounter (Signed)
See result note for documentation

## 2018-07-20 NOTE — Telephone Encounter (Signed)
Copied from [place] [id number]. Topic: General - Other >> Jul 20, 2018  1:35 PM Keene Breath wrote: Reason for CRM: Patient is returning a call to Dr. Nicki Reaper.  Patient stated that the doctor called her last night and wanted her to return the call.  CB# [phone]

## 2018-07-20 NOTE — Telephone Encounter (Signed)
This has already been addressed. See result note

## 2018-07-27 ENCOUNTER — Other Ambulatory Visit (INDEPENDENT_AMBULATORY_CARE_PROVIDER_SITE_OTHER): Payer: BLUE CROSS/BLUE SHIELD

## 2018-07-27 DIAGNOSIS — R944 Abnormal results of kidney function studies: Secondary | ICD-10-CM | POA: Diagnosis not present

## 2018-07-27 LAB — BASIC METABOLIC PANEL
BUN: 35 mg/dL — ABNORMAL HIGH (ref 6–23)
CHLORIDE: 105 meq/L (ref 96–112)
CO2: 29 meq/L (ref 19–32)
Calcium: 9.4 mg/dL (ref 8.4–10.5)
Creatinine, Ser: 1.26 mg/dL — ABNORMAL HIGH (ref 0.40–1.20)
GFR: 45.58 mL/min — ABNORMAL LOW (ref >60.00)
GLUCOSE: 113 mg/dL — AB (ref 70–99)
POTASSIUM: 4.2 meq/L (ref 3.5–5.1)
SODIUM: 141 meq/L (ref 135–145)

## 2018-07-28 ENCOUNTER — Other Ambulatory Visit: Payer: Self-pay | Admitting: *Deleted

## 2018-07-28 ENCOUNTER — Other Ambulatory Visit: Payer: Self-pay | Admitting: Internal Medicine

## 2018-07-28 DIAGNOSIS — R944 Abnormal results of kidney function studies: Secondary | ICD-10-CM

## 2018-07-28 NOTE — Progress Notes (Signed)
Order placed for f/u renal ultrasound.

## 2018-08-12 ENCOUNTER — Ambulatory Visit
Admission: RE | Admit: 2018-08-12 | Discharge: 2018-08-12 | Disposition: A | Discharge status: Home / Self Care | Payer: BLUE CROSS/BLUE SHIELD | Source: Ambulatory Visit | Attending: Internal Medicine | Admitting: Internal Medicine

## 2018-08-12 DIAGNOSIS — R944 Abnormal results of kidney function studies: Secondary | ICD-10-CM | POA: Insufficient documentation

## 2018-08-12 DIAGNOSIS — N189 Chronic kidney disease, unspecified: Secondary | ICD-10-CM | POA: Diagnosis not present

## 2018-08-13 ENCOUNTER — Telehealth: Payer: Self-pay | Admitting: Internal Medicine

## 2018-08-13 ENCOUNTER — Encounter: Payer: Self-pay | Admitting: Internal Medicine

## 2018-08-13 NOTE — Telephone Encounter (Signed)
Pt called stating she is unable to get into My Chart for her [place] results. Results of renal US read to patient

## 2018-08-27 NOTE — Telephone Encounter (Signed)
Notified patient of US results.

## 2018-08-30 ENCOUNTER — Encounter: Payer: Self-pay | Admitting: Internal Medicine

## 2018-08-30 ENCOUNTER — Ambulatory Visit (INDEPENDENT_AMBULATORY_CARE_PROVIDER_SITE_OTHER): Payer: BLUE CROSS/BLUE SHIELD | Admitting: Internal Medicine

## 2018-08-30 VITALS — BP 160/88 | HR 56 | Temp 98.0°F | Ht 60.0 in | Wt 212.6 lb

## 2018-08-30 DIAGNOSIS — R944 Abnormal results of kidney function studies: Secondary | ICD-10-CM

## 2018-08-30 DIAGNOSIS — I70209 Unspecified atherosclerosis of native arteries of extremities, unspecified extremity: Secondary | ICD-10-CM

## 2018-08-30 DIAGNOSIS — I4891 Unspecified atrial fibrillation: Secondary | ICD-10-CM | POA: Diagnosis not present

## 2018-08-30 DIAGNOSIS — Z6841 Body Mass Index (BMI) 40.0 and over, adult: Secondary | ICD-10-CM

## 2018-08-30 DIAGNOSIS — E7800 Pure hypercholesterolemia, unspecified: Secondary | ICD-10-CM | POA: Diagnosis not present

## 2018-08-30 DIAGNOSIS — D329 Benign neoplasm of meninges, unspecified: Secondary | ICD-10-CM

## 2018-08-30 DIAGNOSIS — I739 Peripheral vascular disease, unspecified: Secondary | ICD-10-CM

## 2018-08-30 DIAGNOSIS — R739 Hyperglycemia, unspecified: Secondary | ICD-10-CM

## 2018-08-30 DIAGNOSIS — M5440 Lumbago with sciatica, unspecified side: Secondary | ICD-10-CM

## 2018-08-30 DIAGNOSIS — D582 Other hemoglobinopathies: Secondary | ICD-10-CM

## 2018-08-30 DIAGNOSIS — I10 Essential (primary) hypertension: Secondary | ICD-10-CM

## 2018-08-30 DIAGNOSIS — N289 Disorder of kidney and ureter, unspecified: Secondary | ICD-10-CM

## 2018-08-30 LAB — URINALYSIS, ROUTINE W REFLEX MICROSCOPIC
Bilirubin Urine: NEGATIVE
Hgb urine dipstick: NEGATIVE
Ketones, ur: NEGATIVE
LEUKOCYTES UA: NEGATIVE
NITRITE: NEGATIVE
PH: 7 (ref 5.0–8.0)
SPECIFIC GRAVITY, URINE: 1.01 (ref 1.000–1.030)
Total Protein, Urine: 30 — AB
Urine Glucose: NEGATIVE
Urobilinogen, UA: 0.2 (ref 0.0–1.0)

## 2018-08-30 LAB — BASIC METABOLIC PANEL
BUN: 24 mg/dL — ABNORMAL HIGH (ref 6–23)
CHLORIDE: 104 meq/L (ref 96–112)
CO2: 30 meq/L (ref 19–32)
Calcium: 9.5 mg/dL (ref 8.4–10.5)
Creatinine, Ser: 1.36 mg/dL — ABNORMAL HIGH (ref 0.40–1.20)
GFR: 41.72 mL/min — AB (ref >60.00)
GLUCOSE: 105 mg/dL — AB (ref 70–99)
POTASSIUM: 4.9 meq/L (ref 3.5–5.1)
SODIUM: 141 meq/L (ref 135–145)

## 2018-08-30 MED ORDER — AMLODIPINE BESYLATE 5 MG PO TABS: 5.0000 mg | ORAL_TABLET | Freq: Every day | ORAL | 2 refills | Status: DC

## 2018-08-30 NOTE — Progress Notes (Signed)
Patient ID: Kristin James, female   DOB: 1956/07/13, 62 y.o.   MRN: [id number]   Subjective:    Patient ID: Kristin James, female    DOB: 1955/10/31, 62 y.o.   MRN: [id number]  HPI  Patient here for a scheduled follow up.  She reports she is doing well.  Recently discussed her elevated creatinine and need to avoid antiinflammatories.  She is again requesting refill of meloxicam.  She reports varied joint aching and pain.  Feels needs meloxicam.  Blood pressure still elevated.  Brings in outside readings.  Blood pressures range mostly 130s-160s/70-80s.  Taking her medication regularly.  No chest pain.  No sob.  No acid reflux.  No abdominal pain.  Bowels moving.     Past Medical History:  Diagnosis Date  . Embolus to lower extremity ([place])   . Hiatal hernia   . Hypercholesteremia   . Hyperglycemia   . Hypertension   . Osteoarthritis   . Peripheral arterial disease (HCC)    embolic disease   . Proteinuria   . Spinal stenosis at L4-L5 level   . Thrombophlebitis of posterior tibial vein ([place]) 06/01/2014  . Trigeminal neuralgia 08/2016   Past Surgical History:  Procedure Laterality Date  . AORTOGRAM    . BACK SURGERY  11/13/2014  . CHOLECYSTECTOMY    . COLONOSCOPY WITH PROPOFOL N/A 08/15/2016   Procedure: COLONOSCOPY WITH PROPOFOL;  Surgeon: Lollie Sails, MD;  Location: [facility] ENDOSCOPY;  Service: Endoscopy;  Laterality: N/A;  . KNEE ARTHROSCOPY    . LOWER EXTREMITY ANGIOGRAM     Family History  Problem Relation Age of Onset  . Hypertension Mother   . Stroke Father        3 strokes  . Hypertension Father   . Breast cancer Paternal Grandmother   . Colon cancer Neg Hx    Social History   Socioeconomic History  . Marital status: Married    Spouse name: Not on file  . Number of children: 2  . Years of education: Not on file  . Highest education level: Not on file  Occupational History  . Not on file  Social Needs  . Financial resource strain: Not on file  . Food  insecurity:    Worry: Not on file    Inability: Not on file  . Transportation needs:    Medical: Not on file    Non-medical: Not on file  Tobacco Use  . Smoking status: Never Smoker  . Smokeless tobacco: Never Used  Substance and Sexual Activity  . Alcohol use: Yes    Alcohol/week: 0.0 standard drinks    Comment: occ. wine  . Drug use: No  . Sexual activity: Not on file  Lifestyle  . Physical activity:    Days per week: Not on file    Minutes per session: Not on file  . Stress: Not on file  Relationships  . Social connections:    Talks on phone: Not on file    Gets together: Not on file    Attends religious service: Not on file    Active member of club or organization: Not on file    Attends meetings of clubs or organizations: Not on file    Relationship status: Not on file  Other Topics Concern  . Not on file  Social History Narrative  . Not on file    Outpatient Encounter Medications as of 08/30/2018  Medication Sig  . amiodarone (PACERONE) 200 MG tablet TAKE 1 TABLET  BY MOUTH ONCE DAILY  . atenolol (TENORMIN) 25 MG tablet TAKE 1 TABLET BY MOUTH EVERY DAY  . atorvastatin (LIPITOR) 40 MG tablet TAKE 1 TABLET BY MOUTH ONCE DAILY  . ELIQUIS 5 MG TABS tablet TAKE 1 TABLET BY MOUTH TWICE A DAY  . losartan-hydrochlorothiazide (HYZAAR) 100-12.5 MG tablet take 1 tablet by mouth once daily  . losartan-hydrochlorothiazide (HYZAAR) 100-12.5 MG tablet TAKE 1 TABLET BY MOUTH EVERY DAY  . meloxicam (MOBIC) 7.5 MG tablet Take 7.5 mg by mouth daily.   . [DISCONTINUED] diltiazem (CARDIZEM CD) 120 MG 24 hr capsule take 1 capsule by mouth once daily  . amLODipine (NORVASC) 5 MG tablet Take 1 tablet (5 mg total) by mouth daily.   No facility-administered encounter medications on file as of 08/30/2018.     Review of Systems  Constitutional: Negative for appetite change and unexpected weight change.  HENT: Negative for congestion and sinus pressure.   Respiratory: Negative for cough,  chest tightness and shortness of breath.   Cardiovascular: Negative for chest pain, palpitations and leg swelling.  Gastrointestinal: Negative for abdominal pain, diarrhea and nausea.  Genitourinary: Negative for difficulty urinating and dysuria.  Musculoskeletal: Negative for joint swelling and myalgias.  Skin: Negative for color change and rash.  Neurological: Negative for dizziness, light-headedness and headaches.  Psychiatric/Behavioral: Negative for agitation and dysphoric mood.       Objective:    Physical Exam Constitutional:      General: She is not in acute distress.    Appearance: Normal appearance.  HENT:     Nose: Nose normal. No congestion.     Mouth/Throat:     Pharynx: No oropharyngeal exudate or posterior oropharyngeal erythema.  Neck:     Musculoskeletal: Neck supple. No muscular tenderness.     Thyroid: No thyromegaly.  Cardiovascular:     Rate and Rhythm: Normal rate and regular rhythm.  Pulmonary:     Effort: No respiratory distress.     Breath sounds: Normal breath sounds. No wheezing.  Abdominal:     General: Bowel sounds are normal.     Palpations: Abdomen is soft.     Tenderness: There is no abdominal tenderness.  Musculoskeletal:        General: No swelling or tenderness.  Lymphadenopathy:     Cervical: No cervical adenopathy.  Skin:    Findings: No erythema or rash.  Neurological:     Mental Status: She is alert.  Psychiatric:        Mood and Affect: Mood normal.        Behavior: Behavior normal.     BP (!) 160/88   Pulse (!) 56   Temp 98 F (36.7 C) (Oral)   Ht 5' (1.524 m)   Wt 212 lb 9.6 oz (96.4 kg)   SpO2 92%   BMI 41.52 kg/m  Wt Readings from Last 3 Encounters:  08/30/18 212 lb 9.6 oz (96.4 kg)  07/15/18 211 lb 3.2 oz (95.8 kg)  04/13/18 211 lb 12 oz (96 kg)     Lab Results  Component Value Date   WBC 5.8 07/15/2018   HGB 15.7 (H) 07/15/2018   HCT 46.1 (H) 07/15/2018   PLT 217.0 07/15/2018   GLUCOSE 105 (H)  08/30/2018   CHOL 153 07/15/2018   TRIG 107.0 07/15/2018   HDL 47.40 07/15/2018   LDLCALC 84 07/15/2018   ALT 28 07/15/2018   AST 22 07/15/2018   NA 141 08/30/2018   K 4.9 08/30/2018   CL  104 08/30/2018   CREATININE 1.36 (H) 08/30/2018   BUN 24 (H) 08/30/2018   CO2 30 08/30/2018   TSH 1.04 07/15/2018   INR 1.0 05/23/2014   HGBA1C 6.1 07/15/2018   MICROALBUR 35.5 (H) 04/08/2016    US Renal  Result Date: 08/12/2018 CLINICAL DATA:  Initial evaluation for decreased GFR, chronic kidney disease. EXAM: RENAL / URINARY TRACT ULTRASOUND COMPLETE COMPARISON:  None. FINDINGS: Right Kidney: Renal measurements: 10.5 x 5.5 x 4.9 cm = volume: 140.4 mL . Echogenicity within normal limits. No mass lesion. Minimal pelviectasis without hydronephrosis. Left Kidney: Renal measurements: 9.3 x 4.6 x 4.6 cm = volume: 103.3 mL. Echogenicity within normal limits. No mass lesion. No hydronephrosis. Bladder: Appears normal for degree of bladder distention. IMPRESSION: Normal renal ultrasound.  No hydronephrosis. Electronically Signed   By: Jeannine Boga M.D.   On: 08/12/2018 23:24       Assessment & Plan:   Problem List Items Addressed This Visit    Atrial fibrillation with RVR ([place])    Followed by cardiology.  Rate controlled.  On amiodarone.        Relevant Medications   amLODipine (NORVASC) 5 MG tablet   Back pain    S/p back surgery.  Has seen Dr Carloyn Manner.  Some back pain and joint aches as outlined.  Request refill of meloxicam.  States gabapentin and tramadol did not help.  Tylenol as directed.  Check kidney function.  Recommend avoiding antiinflammatories given elevated blood pressure and kidney function.        BMI 40.0-44.9, adult (HCC)    Discussed diet and exercise.  Follow.        Elevated hemoglobin (HCC)    Recent hgb 15.7.  Follow.        Hypercholesterolemia    On lipitor.  Low cholesterol diet and exercise.  Follow lipid panel and liver function tests.        Relevant  Medications   amLODipine (NORVASC) 5 MG tablet   Hyperglycemia    Low carb diet and exercise.  Follow met b and a1c.        Hypertension    Blood pressure remains elevated.  Will need to clarify medications she is currently taking.  Given heart rate in the 50s and given persistent blood pressure elevation, will stop diltiazem and start amlodipine.  Continue to adjust medication according to blood pressure response.       Relevant Medications   amLODipine (NORVASC) 5 MG tablet   Meningioma (HCC)    Was diagnosed with cerebellopontine angle meningioma.  S/p gamma knife treatments.  Followed by Dr Vallarie Mare and Dr Arlan Organ.        Occlusion of artery of lower extremity (HCC)    S/p angioplasty.  On eliquis.  Currently doing well.        Relevant Medications   amLODipine (NORVASC) 5 MG tablet   Renal insufficiency    Discussed the need to avoid antiinflammatories.  Get blood pressure under better control.  Recheck metabolic panel today.         Other Visit Diagnoses    Decreased GFR    -  Primary   Relevant Orders   Basic metabolic panel (Completed)   Urinalysis, Routine w reflex microscopic (Completed)   PAD (peripheral artery disease) (HCC)   (Chronic)     Relevant Medications   amLODipine (NORVASC) 5 MG tablet       [name], MD

## 2018-08-30 NOTE — Patient Instructions (Signed)
Stop diltiazem  Start amlodipine 5mg  per day.

## 2018-08-30 NOTE — Progress Notes (Signed)
Pre visit review using our clinic review tool, if applicable. No additional management support is needed unless otherwise documented below in the visit note. 

## 2018-08-31 ENCOUNTER — Encounter: Payer: Self-pay | Admitting: *Deleted

## 2018-09-01 ENCOUNTER — Encounter: Payer: Self-pay | Admitting: Internal Medicine

## 2018-09-01 NOTE — Assessment & Plan Note (Signed)
Discussed diet and exercise.  Follow.  

## 2018-09-01 NOTE — Assessment & Plan Note (Signed)
Followed by cardiology.  Rate controlled.  On amiodarone.

## 2018-09-01 NOTE — Assessment & Plan Note (Signed)
Low carb diet and exercise.  Follow met b and a1c.   

## 2018-09-01 NOTE — Assessment & Plan Note (Signed)
On lipitor.  Low cholesterol diet and exercise.  Follow lipid panel and liver function tests.   

## 2018-09-01 NOTE — Assessment & Plan Note (Signed)
S/p back surgery.  Has seen Dr Carloyn Manner.  Some back pain and joint aches as outlined.  Request refill of meloxicam.  States gabapentin and tramadol did not help.  Tylenol as directed.  Check kidney function.  Recommend avoiding antiinflammatories given elevated blood pressure and kidney function.

## 2018-09-01 NOTE — Assessment & Plan Note (Signed)
Discussed the need to avoid antiinflammatories.  Get blood pressure under better control.  Recheck metabolic panel today.

## 2018-09-01 NOTE — Assessment & Plan Note (Signed)
Was diagnosed with cerebellopontine angle meningioma.  S/p gamma knife treatments.  Followed by Dr Vallarie Mare and Dr Arlan Organ.

## 2018-09-01 NOTE — Assessment & Plan Note (Signed)
Recent hgb 15.7.  Follow.

## 2018-09-01 NOTE — Assessment & Plan Note (Signed)
S/p angioplasty.  On eliquis.  Currently doing well.

## 2018-09-01 NOTE — Assessment & Plan Note (Signed)
Blood pressure remains elevated.  Will need to clarify medications she is currently taking.  Given heart rate in the 50s and given persistent blood pressure elevation, will stop diltiazem and start amlodipine.  Continue to adjust medication according to blood pressure response.

## 2018-09-06 ENCOUNTER — Telehealth: Payer: Self-pay | Admitting: Internal Medicine

## 2018-09-06 NOTE — Telephone Encounter (Signed)
Copied from [place] [id number]. Topic: Quick Communication - See Telephone Encounter >> Sep 06, 2018  1:17 PM Burchel, Abbi R wrote: CRM for notification. See Telephone encounter for: 09/06/18.  Pt has stopped taking amLODipine (NORVASC) 5 MG tablet due to severe headaches.  She has changed back to diltiazem (CARDIZEM CD) 120 MG 24 hr capsule after 3 days.  She is no longer having headaches.  Pt would like to notify Dr Nicki Reaper, and see what she would like to do going forward.  Please advise.

## 2018-09-07 ENCOUNTER — Other Ambulatory Visit: Payer: Self-pay

## 2018-09-07 ENCOUNTER — Encounter: Payer: Self-pay | Admitting: Internal Medicine

## 2018-09-07 NOTE — Telephone Encounter (Signed)
Amlodipine usually does not cause headaches.  Per note, it appears she is doing better off medication.  Need to know how blood pressure is running.  If persistent elevation, will need to start a new medication.  (will start hydralazine).  Just let me know about blood pressure.  With her worsening kidney function, unable to take antiinflammatories (meloxicam).  If having increased joint pain - persistent - can refer to rheumatology for further treatment recs.  (has tried tramadol and gabapentin and did not help).

## 2018-09-07 NOTE — Telephone Encounter (Signed)
She says that she is not happy with this. Persistently stating that she needs meloxicam. She declined referral to rheumatology. She states that she does not understand why it is such an issue since cardiology and urology gave ok to take meloxicam. Explained that with worsening kidney function, we are unable to prescribe antiinflammatories. Patient stated that if that is the case, she will have to find a new doctor. She will monitor blood pressure. It was 128/80 this afternoon.

## 2018-09-07 NOTE — Telephone Encounter (Signed)
Patient has stopped taking amlodipine and started back on diltiazem 120 mg q day due to amlodipine causing head aches. Patient also stated that she really needed to get back on her meloxicam and would prefer the 15 mg. Advised that PCP has recommended that she remain off of meloxicam. Also, there is a result note on this patient needing to know exactly what pt is taking.   She is currently taking: Amiodarone 200mg  q day Atenolol 25 mg q day Losartan/HCTZ 100/12.5 mg q day Atorvastatin 40 mg q day Eliquis 5 mg BID Diltiazem 120 mg q day   Patient says outside of these medications, she occasionally takes tylenol but that is it.

## 2018-09-08 NOTE — Telephone Encounter (Signed)
Pt also sent my chart message.  Response sent via my chart message.

## 2018-09-19 ENCOUNTER — Other Ambulatory Visit: Payer: Self-pay | Admitting: Cardiovascular Disease

## 2018-09-20 NOTE — Telephone Encounter (Signed)
Please review for refill. Thank!  

## 2018-09-23 ENCOUNTER — Encounter: Payer: Self-pay | Admitting: Internal Medicine

## 2018-09-23 ENCOUNTER — Other Ambulatory Visit: Payer: Self-pay | Admitting: Internal Medicine

## 2018-09-23 MED ORDER — AMLODIPINE BESYLATE 5 MG PO TABS: 5.0000 mg | ORAL_TABLET | Freq: Two times a day (BID) | ORAL | 2 refills | Status: DC

## 2018-09-23 NOTE — Telephone Encounter (Signed)
Please call pt and find out how long she has been back on the amlodipine.  She should remain off the cardizem if taking amlodipine.  If just started back on the medication, would give it a little longer to take effect. If has been on for a while, I can adjust the dose.

## 2018-09-23 NOTE — Telephone Encounter (Signed)
Patient stated she has been taking the amlodipine since January 8. She has about a week and a half of pills left. Advised she should stay off of cardizem and I would let her know how you would like to adjust dose

## 2018-09-23 NOTE — Progress Notes (Signed)
rx sent in for amlodipine #60 with 2 refills.

## 2018-09-27 ENCOUNTER — Other Ambulatory Visit: Payer: Self-pay | Admitting: Internal Medicine

## 2018-09-27 MED ORDER — LOSARTAN POTASSIUM-HCTZ 100-12.5 MG PO TABS: 1.0000 | ORAL_TABLET | Freq: Every day | ORAL | 0 refills | Status: DC

## 2018-09-27 NOTE — Telephone Encounter (Signed)
Copied from [place] [id number]. Topic: Quick Communication - See Telephone Encounter >> Sep 27, 2018  9:38 AM Ivar Drape wrote: CRM for notification. See Telephone encounter for: 09/27/18. Patient would like a refill on her losartan-hydrochlorothiazide (HYZAAR) 100-12.5 MG tablet medication and have it sent to her preferred pharmacy [facility] on [facility]. Patient stated if she cannot get the two together she will take the medications seperateley.  Patiently only have two tablets left.

## 2018-09-28 ENCOUNTER — Other Ambulatory Visit: Payer: Self-pay

## 2018-09-28 MED ORDER — LOSARTAN POTASSIUM-HCTZ 100-12.5 MG PO TABS: 1.0000 | ORAL_TABLET | Freq: Every day | ORAL | 0 refills | Status: DC

## 2018-10-11 ENCOUNTER — Telehealth: Payer: Self-pay | Admitting: Internal Medicine

## 2018-10-11 ENCOUNTER — Other Ambulatory Visit: Payer: Self-pay | Admitting: Internal Medicine

## 2018-10-11 NOTE — Telephone Encounter (Signed)
Pt dropped off BP results for Dr. Nicki Reaper. Envelope is up front in Dr. Bary Leriche color folder.

## 2018-10-11 NOTE — Telephone Encounter (Signed)
Reviewed blood pressure readings.  Her pressures are improving.  Please clarify with pt exactly what blood pressure medications she is taking and correct med list.  We had started her on amlodipine.  (confirm stopped cardizem).  Also, should be on losartan/hctz.  Please confirm.  Regarding the meloxicam, the reason for holding on restarting this medcation was the decrease in her renal function.  Will need to follow kidney function.  Blood pressures are still a little high.  Will need f/u with me.

## 2018-10-11 NOTE — Telephone Encounter (Signed)
BP readings placed in folder for your review along with a note from her asking if she can restart her meloxicam 15 mg.

## 2018-10-12 NOTE — Telephone Encounter (Signed)
Pt stated tht she is not making a follow up appt. She is "tired of fooling with this." She does not understand why she can not take meloxicam. She stated she will think about what she would like to do

## 2018-11-18 ENCOUNTER — Other Ambulatory Visit: Payer: Self-pay | Admitting: Cardiovascular Disease

## 2018-12-18 ENCOUNTER — Other Ambulatory Visit: Payer: Self-pay | Admitting: Cardiovascular Disease

## 2018-12-20 NOTE — Telephone Encounter (Signed)
Wt 96.4 kg, age 62, serum creatinine 1.36, CrCl 64.43.

## 2018-12-20 NOTE — Telephone Encounter (Signed)
Please review for refill.  

## 2018-12-27 ENCOUNTER — Telehealth: Payer: Self-pay | Admitting: Internal Medicine

## 2018-12-27 ENCOUNTER — Other Ambulatory Visit: Payer: Self-pay

## 2018-12-27 MED ORDER — AMLODIPINE BESYLATE 5 MG PO TABS: 5.0000 mg | ORAL_TABLET | Freq: Two times a day (BID) | ORAL | 2 refills | Status: DC

## 2018-12-27 NOTE — Telephone Encounter (Signed)
Copied from [place] [id number]. Topic: Quick Communication - Rx Refill/Question >> Dec 27, 2018 10:07 AM Rayann Heman wrote: Medication: amLODipine (NORVASC) 5 MG tablet [[id number]]  Has the patient contacted their pharmacy?   Preferred Pharmacy (with phone number or street name):[id number] - [place], [place] - [place] [phone] (Phone) [phone] (Fax)    Agent: Please be advised that RX refills may take up to 3 business days. We ask that you follow-up with your pharmacy.

## 2018-12-27 NOTE — Telephone Encounter (Signed)
rx refilled.

## 2019-02-18 DIAGNOSIS — G500 Trigeminal neuralgia: Secondary | ICD-10-CM | POA: Diagnosis not present

## 2019-02-18 DIAGNOSIS — D329 Benign neoplasm of meninges, unspecified: Secondary | ICD-10-CM | POA: Diagnosis not present

## 2019-03-18 ENCOUNTER — Other Ambulatory Visit: Payer: Self-pay | Admitting: Cardiovascular Disease

## 2019-03-18 NOTE — Telephone Encounter (Signed)
Please review for refill.  

## 2019-03-18 NOTE — Telephone Encounter (Signed)
62yof, 96.4kg, scr 1.36(08/20/18), lovw/gollan(04/14/19)

## 2019-03-21 ENCOUNTER — Ambulatory Visit (INDEPENDENT_AMBULATORY_CARE_PROVIDER_SITE_OTHER): Payer: BLUE CROSS/BLUE SHIELD | Admitting: Family Medicine

## 2019-03-21 ENCOUNTER — Other Ambulatory Visit: Payer: Self-pay

## 2019-03-21 ENCOUNTER — Encounter: Payer: Self-pay | Admitting: Family Medicine

## 2019-03-21 DIAGNOSIS — L03116 Cellulitis of left lower limb: Secondary | ICD-10-CM

## 2019-03-21 MED ORDER — CEPHALEXIN 500 MG PO CAPS: 500.0000 mg | ORAL_CAPSULE | Freq: Four times a day (QID) | ORAL | 0 refills | Status: DC

## 2019-03-21 NOTE — Progress Notes (Signed)
Patient ID: Kristin James, female   DOB: [birth year], 63 y.o.   MRN: [id number]    Virtual Visit via video Note  This visit type was conducted due to national recommendations for restrictions regarding the COVID-19 pandemic (e.g. social distancing).  This format is felt to be most appropriate for this patient at this time.  All issues noted in this document were discussed and addressed.  No physical exam was performed (except for noted visual exam findings with Video Visits).   I connected with Amelia Jo today at  1:00 PM EDT by a video enabled telemedicine application or telephone and verified that I am speaking with the correct person using two identifiers. Location patient: home Location provider: work or home office Persons participating in the virtual visit: patient, provider  I discussed the limitations, risks, security and privacy concerns of performing an evaluation and management service by telephone and the availability of in person appointments. I also discussed with the patient that there may be a patient responsible charge related to this service. The patient expressed understanding and agreed to proceed.   HPI:  Patient and I connected via video due to swelling, redness and itching of left lower extremity.  Patient states there is a red circular area on the left ankle, not sure where it is from but suspects could have been bitten by an insect.  Denies a tick bite, but it is possible she was bitten by spider or mosquito.  States she has been putting hydrocortisone cream on the redness to help calm both the rash and itchiness.  Denies any fluid buildup, no pitting edema.  States skin does feel warmer to touch when she compares to right lower extremity.  Patient had been on Decadron for approximately a month to treat pain and was told this could make her more susceptible to infections.    ROS: See pertinent positives and negatives per HPI.  Past Medical History:  Diagnosis  Date  . Embolus to lower extremity ([place])   . Hiatal hernia   . Hypercholesteremia   . Hyperglycemia   . Hypertension   . Osteoarthritis   . Peripheral arterial disease (HCC)    embolic disease   . Proteinuria   . Spinal stenosis at L4-L5 level   . Thrombophlebitis of posterior tibial vein ([place]) 06/01/2014  . Trigeminal neuralgia 08/2016    Past Surgical History:  Procedure Laterality Date  . AORTOGRAM    . BACK SURGERY  11/13/2014  . CHOLECYSTECTOMY    . COLONOSCOPY WITH PROPOFOL N/A 08/15/2016   Procedure: COLONOSCOPY WITH PROPOFOL;  Surgeon: Lollie Sails, MD;  Location: [facility] ENDOSCOPY;  Service: Endoscopy;  Laterality: N/A;  . KNEE ARTHROSCOPY    . LOWER EXTREMITY ANGIOGRAM      Family History  Problem Relation Age of Onset  . Hypertension Mother   . Stroke Father        3 strokes  . Hypertension Father   . Breast cancer Paternal Grandmother   . Colon cancer Neg Hx    Social History   Tobacco Use  . Smoking status: Never Smoker  . Smokeless tobacco: Never Used  Substance Use Topics  . Alcohol use: Yes    Alcohol/week: 0.0 standard drinks    Comment: occ. wine    Current Outpatient Medications:  .  amiodarone (PACERONE) 200 MG tablet, TAKE 1 TABLET BY MOUTH ONCE DAILY, Disp: 270 tablet, Rfl: 2 .  amLODipine (NORVASC) 5 MG tablet, Take 1 tablet (5 mg total)  by mouth 2 (two) times daily., Disp: 60 tablet, Rfl: 2 .  atenolol (TENORMIN) 25 MG tablet, TAKE 1 TABLET BY MOUTH EVERY DAY, Disp: 30 tablet, Rfl: 5 .  atorvastatin (LIPITOR) 40 MG tablet, TAKE 1 TABLET BY MOUTH ONCE DAILY, Disp: 90 tablet, Rfl: 0 .  CARTIA XT 120 MG 24 hr capsule, TAKE 1 CAPSULE BY MOUTH ONCE DAILY, Disp: 90 capsule, Rfl: 3 .  ELIQUIS 5 MG TABS tablet, TAKE 1 TABLET BY MOUTH TWICE DAILY, Disp: 180 tablet, Rfl: 0 .  losartan-hydrochlorothiazide (HYZAAR) 100-12.5 MG tablet, Take 1 tablet by mouth daily., Disp: 90 tablet, Rfl: 0 .  meloxicam (MOBIC) 7.5 MG tablet, Take 7.5 mg by mouth daily.  , Disp: , Rfl: 0  EXAM:  GENERAL: alert, oriented, appears well and in no acute distress  HEENT: atraumatic, conjunttiva clear, no obvious abnormalities on inspection of external nose and ears  NECK: normal movements of the head and neck  LUNGS: on inspection no signs of respiratory distress, breathing rate appears normal, no obvious gross SOB, gasping or wheezing  CV: no obvious cyanosis  MS: moves all visible extremities without noticeable abnormality  SKIN: Circular area on left ankle does appear consistent with some sort of insect bite.  Redness extending up into the mid calf looks suspicious for a cellulitis.  PSYCH/NEURO: pleasant and cooperative, no obvious depression or anxiety, speech and thought processing grossly intact  ASSESSMENT AND PLAN:  Discussed the following assessment and plan:  Cellulitis/insect bite-suspect patient had an insect bite which she then scratched due to itching causing a cellulitis infection to occur.  Advised she may continue to use hydrocortisone topically to help calm down itch and she will begin flex 4 times daily for 10 days.  Advised to draw line on herself and monitor for redness to be receding rather than worsening.  Advised if redness worsens, develops fever or chills increasing pain or any other alarming symptoms to call office right away.  Patient is chronically anticoagulated on Eliquis due to A. fib history.   I discussed the assessment and treatment plan with the patient. The patient was provided an opportunity to ask questions and all were answered. The patient agreed with the plan and demonstrated an understanding of the instructions.   The patient was advised to call back or seek an in-person evaluation if the symptoms worsen or if the condition fails to improve as anticipated.   Jodelle Green, FNP

## 2019-03-22 DIAGNOSIS — G500 Trigeminal neuralgia: Secondary | ICD-10-CM | POA: Diagnosis not present

## 2019-03-22 DIAGNOSIS — D320 Benign neoplasm of cerebral meninges: Secondary | ICD-10-CM | POA: Diagnosis not present

## 2019-03-23 DIAGNOSIS — G500 Trigeminal neuralgia: Secondary | ICD-10-CM | POA: Diagnosis not present

## 2019-03-23 DIAGNOSIS — D329 Benign neoplasm of meninges, unspecified: Secondary | ICD-10-CM | POA: Diagnosis not present

## 2019-04-07 ENCOUNTER — Other Ambulatory Visit: Payer: Self-pay

## 2019-04-07 DIAGNOSIS — Z20822 Contact with and (suspected) exposure to covid-19: Secondary | ICD-10-CM

## 2019-04-08 LAB — NOVEL CORONAVIRUS, NAA: SARS-CoV-2, NAA: NOT DETECTED

## 2019-04-14 DIAGNOSIS — Z923 Personal history of irradiation: Secondary | ICD-10-CM | POA: Diagnosis not present

## 2019-04-14 DIAGNOSIS — D320 Benign neoplasm of cerebral meninges: Secondary | ICD-10-CM | POA: Diagnosis not present

## 2019-04-14 DIAGNOSIS — G500 Trigeminal neuralgia: Secondary | ICD-10-CM | POA: Diagnosis not present

## 2019-05-03 ENCOUNTER — Other Ambulatory Visit: Payer: Self-pay | Admitting: Cardiovascular Disease

## 2019-05-03 ENCOUNTER — Telehealth: Payer: Self-pay | Admitting: Cardiovascular Disease

## 2019-05-03 NOTE — Telephone Encounter (Signed)
Patient was left a voicemail on cell to call and schedule followup

## 2019-05-03 NOTE — Telephone Encounter (Signed)
-----   Message from Anselm Pancoast, [place] sent at 05/03/2019 11:34 AM EDT ----- Please contact this patient for a follow up with Dr. Rockey Situ. She was to follow up in Aug. 2020

## 2019-05-04 ENCOUNTER — Other Ambulatory Visit: Payer: Self-pay | Admitting: Internal Medicine

## 2019-05-04 DIAGNOSIS — Z1231 Encounter for screening mammogram for malignant neoplasm of breast: Secondary | ICD-10-CM

## 2019-05-26 ENCOUNTER — Other Ambulatory Visit: Payer: Self-pay

## 2019-05-26 ENCOUNTER — Ambulatory Visit (INDEPENDENT_AMBULATORY_CARE_PROVIDER_SITE_OTHER): Payer: 59 | Admitting: Neurology

## 2019-05-26 ENCOUNTER — Encounter: Payer: Self-pay | Admitting: Neurology

## 2019-05-26 VITALS — BP 144/64 | HR 61 | Temp 96.7°F | Ht 60.0 in | Wt 209.5 lb

## 2019-05-26 DIAGNOSIS — D329 Benign neoplasm of meninges, unspecified: Secondary | ICD-10-CM | POA: Diagnosis not present

## 2019-05-26 DIAGNOSIS — R29898 Other symptoms and signs involving the musculoskeletal system: Secondary | ICD-10-CM | POA: Diagnosis not present

## 2019-05-26 DIAGNOSIS — G500 Trigeminal neuralgia: Secondary | ICD-10-CM | POA: Diagnosis not present

## 2019-05-26 MED ORDER — OXCARBAZEPINE 300 MG PO TABS: 300.0000 mg | ORAL_TABLET | Freq: Two times a day (BID) | ORAL | 11 refills | Status: DC

## 2019-05-26 MED ORDER — LAMOTRIGINE 100 MG PO TABS: 100.0000 mg | ORAL_TABLET | Freq: Two times a day (BID) | ORAL | 11 refills | Status: DC

## 2019-05-26 NOTE — Progress Notes (Signed)
[facility]  PATIENT: Kristin James DOB: 06/12/1956  REFERRING DOCTOR OR PCP:  [name] SOURCE: Patient, notes from Dr. Nicki Reaper,  _________________________________   HISTORICAL  CHIEF COMPLAINT:  Chief Complaint  Patient presents with  . Follow-up    RM 12 with husband. Last seen 04/23/2017. Here to f/u on similar issues.     HISTORY OF PRESENT ILLNESS:  Kristin James is a 63 y.o. woman with a meningioma causing left trigeminal neuralgia.  Update 05/26/2019: She had the gamma knife procedure 10/2016.  I saw her 04/24/2019.    We adjusted some medication dose and her pain improved.    She had trouble tolerating gabapentin and she was switched to oxcarbazepine.     In February 2020, pain started to worsen again.   MRI June 2020 showed no increase in the tumor size.   She was placed on steroids without much benefit.  Tumor was actually mildly lower.    She saw Dr. Redmond Pulling about possibility of surgery.   He did not recommend surgery as he could not completely remove it and complication risk is high.  The last MRI shows slight decrease in size of tumor.   MRI 03/23/2019 [facility]) 1. Redemonstrated extra-axial dural based mass along the left cerebellar pontine angle with extension into the left internal auditory canal, left Meckel's cave, and Pars Nervosa of the left jugular foramen which is similar to possibly slightly decreased in size from exams dated 07/09/2018 and 07/08/2017. 2. No significant change in surrounding adjacent mass effect, particularly on the middle cerebellar peduncle and medulla/pons.  3. No hydrocephalus. 4. No acute intracranial abnormality.  From 04/23/2017: She is a 63 year old woman who began to experience a dull ache in the left jaw region in mid 2017. Initially, she felt her symptoms were due to a dental issue and she had a tooth removed and a repeat root canal. However, there was no improvement. A few months later she began to  experience short shock like severe pain in the left jaw. Her primary care provider, Dr. Nicki Reaper started gabapentin and referred her to Dr. Melrose Nakayama in [place].   She had a dull pain and would get frequent zaps of severe pain into the left cheek.  When pain was more severe, she couldn't talk.   Brushing her teeth would trigger the most painful sensations.   She was initially placed on gabapentin 100 mg several times a day by her PCP and then Tegretol was added. The Tegretol was switched to Trileptal for tolerability. She continued to have pain and was referred to neurosurgery at [facility] for evaluation of gamma knife procedures after an MRI of the brain showed that she had a meningioma that involved the facial nerve region.    She underwent Gamma knife stereotactic radiosurgery by Dr. Arlan Organ and Dr. Vallarie Mare at [facility] neurosurgery and radiation oncology at a dose of 20 Gy was delivered to the outlined tumor divided into 4 fractions.   She felt pain improved some initially but did not resolve.   Pain worsened in July, and gabapentin and oxcarbazepine were both increased from 300 mg po tid to 300 mg po qid and 300 mg po tid.    She is tolerating her medications fairly well right now. She does feel little bit off balance at times and still does not feel as sharp as she did before all of the medications. She has only been on the higher dose for several days and continues to  experience symptoms though they are better than they were a couple weeks ago.  She stopped driving 3-4 weeks ago due to visual changes and a foggy sensation and slurred speech that was bad x 2 weeks but now better.   She still feels memory is bad  I reviewed imaging reports. The MRI of the brain 10/01/2016 performed at [facility] shows a large left cerebellopontine angle meningioma extending into the left internal auditory canal. There is mass effect on the pons and left vertebral artery. The tumor crosses the expected path of the left  cranial nerves V, VI, VII and VIII. also reviewed the MRI of the brain 11/17/2016 showing the same mass and mentioning redemonstration of encephalomalacia in the left parieto-occipital region and lacunar infarction in the right cerebellum and thalamus. REVIEW OF SYSTEMS: Constitutional: No fevers, chills, sweats, or change in appetite Eyes: No visual changes, double vision, eye pain Ear, nose and throat: No hearing loss, ear pain, nasal congestion, sore throat Cardiovascular: No chest pain, palpitations Respiratory: No shortness of breath at rest or with exertion.   No wheezes GastrointestinaI: No nausea, vomiting, diarrhea, abdominal pain, fecal incontinence Genitourinary: No dysuria, urinary retention or frequency.  No nocturia. Musculoskeletal: No neck pain, back pain Integumentary: No rash, pruritus, skin lesions Neurological: as above Psychiatric: No depression at this time.  No anxiety Endocrine: No palpitations, diaphoresis, change in appetite, change in weigh or increased thirst Hematologic/Lymphatic: No anemia, purpura, petechiae. Allergic/Immunologic: No itchy/runny eyes, nasal congestion, recent allergic reactions, rashes  ALLERGIES: No Known Allergies  HOME MEDICATIONS:  Current Outpatient Medications:  .  amiodarone (PACERONE) 200 MG tablet, TAKE 1 TABLET BY MOUTH ONCE DAILY, Disp: 270 tablet, Rfl: 2 .  amLODipine (NORVASC) 5 MG tablet, Take 1 tablet (5 mg total) by mouth 2 (two) times daily., Disp: 60 tablet, Rfl: 2 .  atenolol (TENORMIN) 25 MG tablet, TAKE 1 TABLET BY MOUTH EVERY DAY, Disp: 30 tablet, Rfl: 0 .  atorvastatin (LIPITOR) 40 MG tablet, TAKE 1 TABLET BY MOUTH ONCE DAILY, Disp: 90 tablet, Rfl: 0 .  CARTIA XT 120 MG 24 hr capsule, TAKE 1 CAPSULE BY MOUTH ONCE DAILY, Disp: 90 capsule, Rfl: 3 .  ELIQUIS 5 MG TABS tablet, TAKE 1 TABLET BY MOUTH TWICE DAILY, Disp: 180 tablet, Rfl: 0 .  losartan-hydrochlorothiazide (HYZAAR) 100-12.5 MG tablet, Take 1 tablet by  mouth daily., Disp: 90 tablet, Rfl: 0 .  meloxicam (MOBIC) 7.5 MG tablet, Take 7.5 mg by mouth daily. , Disp: , Rfl: 0 .  lamoTRIgine (LAMICTAL) 100 MG tablet, Take 1 tablet (100 mg total) by mouth 2 (two) times daily., Disp: 60 tablet, Rfl: 11  PAST MEDICAL HISTORY: Past Medical History:  Diagnosis Date  . Embolus to lower extremity ([place])   . Hiatal hernia   . Hypercholesteremia   . Hyperglycemia   . Hypertension   . Osteoarthritis   . Peripheral arterial disease (HCC)    embolic disease   . Proteinuria   . Spinal stenosis at L4-L5 level   . Thrombophlebitis of posterior tibial vein ([place]) 06/01/2014  . Trigeminal neuralgia 08/2016    PAST SURGICAL HISTORY: Past Surgical History:  Procedure Laterality Date  . AORTOGRAM    . BACK SURGERY  11/13/2014  . CHOLECYSTECTOMY    . COLONOSCOPY WITH PROPOFOL N/A 08/15/2016   Procedure: COLONOSCOPY WITH PROPOFOL;  Surgeon: Lollie Sails, MD;  Location: [facility] ENDOSCOPY;  Service: Endoscopy;  Laterality: N/A;  . KNEE ARTHROSCOPY    . LOWER EXTREMITY ANGIOGRAM  FAMILY HISTORY: Family History  Problem Relation Age of Onset  . Hypertension Mother   . Stroke Father        3 strokes  . Hypertension Father   . Breast cancer Paternal Grandmother   . Colon cancer Neg Hx     SOCIAL HISTORY:  Social History   Socioeconomic History  . Marital status: Married    Spouse name: Not on file  . Number of children: 2  . Years of education: Not on file  . Highest education level: Not on file  Occupational History  . Not on file  Social Needs  . Financial resource strain: Not on file  . Food insecurity    Worry: Not on file    Inability: Not on file  . Transportation needs    Medical: Not on file    Non-medical: Not on file  Tobacco Use  . Smoking status: Never Smoker  . Smokeless tobacco: Never Used  Substance and Sexual Activity  . Alcohol use: Yes    Alcohol/week: 0.0 standard drinks    Comment: occ. wine  . Drug use: No   . Sexual activity: Not on file  Lifestyle  . Physical activity    Days per week: Not on file    Minutes per session: Not on file  . Stress: Not on file  Relationships  . Social Herbalist on phone: Not on file    Gets together: Not on file    Attends religious service: Not on file    Active member of club or organization: Not on file    Attends meetings of clubs or organizations: Not on file    Relationship status: Not on file  . Intimate partner violence    Fear of current or ex partner: Not on file    Emotionally abused: Not on file    Physically abused: Not on file    Forced sexual activity: Not on file  Other Topics Concern  . Not on file  Social History Narrative   Right handed    Caffeine use: coffee (1/2 cup per day)     PHYSICAL EXAM  Vitals:   05/26/19 0938  BP: (!) 144/64  Pulse: 61  Temp: (!) 96.7 F (35.9 C)  Weight: 209 lb 8 oz (95 kg)  Height: 5' (1.524 m)    Body mass index is 40.92 kg/m.   General: The patient is well-developed and well-nourished and in no acute distress  Skin:  No rashes  Neurologic Exam  Mental status: The patient is alert and oriented x 3 at the time of the examination. The patient has apparent normal recent and remote memory, with an apparently normal attention span and concentration ability.   Speech is normal.  Cranial nerves: Extraocular movements are full. Facial symmetry is present. There is good facial sensation to soft touch bilaterally.Facial strength is normal.  Trapezius and sternocleidomastoid strength is normal. No dysarthria is noted.   No obvious hearing deficits are noted.  Motor:  Muscle bulk is normal.   Tone is normal. Strength is  5 / 5 in all 4 extremities.   Sensory: Sensory testing is intact to touch and vibration sensation in all 4 extremities.  Coordination: Cerebellar testing reveals good finger-nose-finger and heel-to-shin bilaterally.  Gait and station: Station is normal.   Gait is  slightly off balanced and favors the left leg. Tandem gait is wide . Romberg is negative.   Reflexes: Deep tendon reflexes are symmetric and  normal bilaterally.       DIAGNOSTIC DATA (LABS, IMAGING, TESTING) - I reviewed patient records, labs, notes, testing and imaging myself where available.  Lab Results  Component Value Date   WBC 5.8 07/15/2018   HGB 15.7 (H) 07/15/2018   HCT 46.1 (H) 07/15/2018   MCV 92.2 07/15/2018   PLT 217.0 07/15/2018      Component Value Date/Time   NA 141 08/30/2018 1254   NA 142 05/23/2014 0540   NA 142 05/23/2014 0540   K 4.9 08/30/2018 1254   K 3.7 05/23/2014 0540   CL 104 08/30/2018 1254   CL 114 (H) 05/23/2014 0540   CO2 30 08/30/2018 1254   CO2 21 05/23/2014 0540   GLUCOSE 105 (H) 08/30/2018 1254   GLUCOSE 90 05/23/2014 0540   BUN 24 (H) 08/30/2018 1254   BUN 16 05/23/2014 0540   BUN 16 05/23/2014 0540   CREATININE 1.36 (H) 08/30/2018 1254   CREATININE 1.07 05/23/2014 0540   CALCIUM 9.5 08/30/2018 1254   CALCIUM 7.4 (L) 05/23/2014 0540   PROT 7.4 07/15/2018 1207   PROT 7.4 05/17/2013 1028   ALBUMIN 4.3 07/15/2018 1207   ALBUMIN 3.6 05/17/2013 1028   AST 22 07/15/2018 1207   AST 28 05/17/2013 1028   ALT 28 07/15/2018 1207   ALT 27 05/17/2013 1028   ALKPHOS 97 07/15/2018 1207   ALKPHOS 103 05/17/2013 1028   BILITOT 0.8 07/15/2018 1207   BILITOT 0.8 05/17/2013 1028   GFRNONAA 57 (L) 05/23/2014 0540   GFRAA >60 05/23/2014 0540   Lab Results  Component Value Date   CHOL 153 07/15/2018   HDL 47.40 07/15/2018   LDLCALC 84 07/15/2018   TRIG 107.0 07/15/2018   CHOLHDL 3 07/15/2018   Lab Results  Component Value Date   HGBA1C 6.1 07/15/2018   No results found for: [id number] Lab Results  Component Value Date   TSH 1.04 07/15/2018       ASSESSMENT AND PLAN   Meningioma (HCC)  Trigeminal neuralgia of left side of face  Left leg weakness  1.   Add lamotrigine and titrate to 100 mg po bid --- increase further as  needed based on efficacy and tolerability.    If a rash develops, stop the lamotrigine 2.   For now continue oxcarbazepine.  We may reduce dose or taper off based on efficacy of lamotrigine. 3.   Stay active and exercise as tolerated. 4.   RTC 6 months, sooner if problems  Jodee Wagenaar A. Felecia Shelling, MD, [facility] [id number], [id number] AM Certified in Neurology, Clinical Neurophysiology, Sleep Medicine, Pain Medicine and Neuroimaging  [facility] [address], [place],  [address] [phone]

## 2019-05-26 NOTE — Patient Instructions (Signed)
The pharmacy has the prescription for lamotrigine 100 mg tablets. For 5 days, just take one half pill a day. For the next 5 days, take one half pill twice a day. For the next 5 days, take one half pill 3 times a day Then start taking one pill twice a day from this point on.      If you get a rash, need to stop the medication and not take it again. 

## 2019-05-27 ENCOUNTER — Other Ambulatory Visit: Payer: Self-pay | Admitting: Internal Medicine

## 2019-05-27 MED ORDER — AMLODIPINE BESYLATE 5 MG PO TABS: 5.0000 mg | ORAL_TABLET | Freq: Two times a day (BID) | ORAL | 0 refills | Status: DC

## 2019-05-27 NOTE — Telephone Encounter (Signed)
Medication Refill - Medication: amLODipine (NORVASC) 5 MG tablet  Has the patient contacted their pharmacy? Yes - states they haven't heard from [place] in two days (Agent: If no, request that the patient contact the pharmacy for the refill.) (Agent: If yes, when and what did the pharmacy advise?)  Preferred Pharmacy (with phone number or street name):  [id number] - [place], [place] - [place] [phone] (Phone) [phone] (Fax)   Agent: Please be advised that RX refills may take up to 3 business days. We ask that you follow-up with your pharmacy.

## 2019-05-27 NOTE — Telephone Encounter (Signed)
Requested medication (s) are due for refill today: yes  Requested medication (s) are on the active medication list: yes  Last refill:  12/27/2018  Future visit scheduled: no  Notes to clinic:  Review for refill   Requested Prescriptions  Pending Prescriptions Disp Refills   amLODipine (NORVASC) 5 MG tablet 60 tablet 2    Sig: Take 1 tablet (5 mg total) by mouth 2 (two) times daily.     Cardiovascular:  Calcium Channel Blockers Failed - 05/27/2019  9:52 AM      Failed - Last BP in normal range    BP Readings from Last 1 Encounters:  05/26/19 (!) 144/64         Passed - Valid encounter within last 6 months    Recent Outpatient Visits          2 months ago Cellulitis of left lower extremity   [place] [facility], Jacquelynn Cree, FNP   9 months ago Decreased GFR   [facility] [place], Randell Patient, MD   10 months ago Renal insufficiency   [facility] [place], Randell Patient, MD   1 year ago Atrial fibrillation with R[facility])   [facility] [place] [name], MD   1 year ago Routine general medical examination at a health care facility   [facility], MD      Future Appointments            In 4 days [place], Kathlene November, MD [facility] [place]

## 2019-05-28 NOTE — Progress Notes (Signed)
Cardiology Office Note  Date:  05/31/2019   ID:  Kristin James, DOB 17-Apr-1956, MRN [id number]  PCP:  [name], MD   Chief Complaint  Patient presents with  . Other    12 month follow up. patient denies chest pain and SOB at this time. Meds reviewed verbally with patient.     HPI:  Kristin James is a pleasant 63 year old woman with history of  paroxysmal atrial fibrillation,  HTN,  chronic back pain,  spinal stenosis,  obesity,   Trigeminal neuralgia hospital in September 2015 for acute left lower extremity arterial ischemia secondary to thrombus.  She presents for routine followup of her atrial fibrillation  In follow-up today she reports that she has had recurrence of tumor pressing on her fifth cranial nerve Had gamma knife treatment in the past Pain previously improved Pain back, she was told that the tumor is too big for surgery Will just need to wait and see if it decreases size on its own or could take it apart in pieces  Also having chronic Back issue, balance issue, No sciatica  Lab work reviewed HBA1C 6.1 Total chol 153  Denies having tachycardia or palpitations concerning for paroxysmal atrial fibrillation Denies any chest pain or shortness of breath concerning for angina  EKG personally reviewed by myself on todays visit hows Normal sinus rhythm with rate 69 bpm no significant ST or T-wave changes   other past medical history reviewed   Trigeminal neuralgia x 6 months. She has seen several specialists, Including prednisone, Neurontin, trileptal among others  Severe pain left side of face, shocking pain,   Other past medical history back surgery March 2016, lumbar region Prior office visit, EKG documented atrial fibrillation. Started on amiodarone, diltiazem, beta blocker and she converted to normal sinus rhythm. Atenolol dose decreased secondary to bradycardia  admitted to the hospital on May 21 2014 with acute pain in her left leg. She had  no distal pulses. Her creatinine was 1.99 which improved to 1.0 with fluids. She underwent angiogram of the left lower extremity with thrombolysis with TPA of the left pill, tibial peroneal trunk, peroneal arteries and distally, thrombectomy of the vessel with restoration of flow. she does not have any sleep apnea, does have occasional snoring  Stress test was done in the hospital for chest pain. This showed no ischemia. Ejection fraction 67% EKG in the hospital showed normal sinus rhythm with rate 74 beats per minute. EKG dated 05/21/2014   PMH:   has a past medical history of Embolus to lower extremity ([place]), Hiatal hernia, Hypercholesteremia, Hyperglycemia, Hypertension, Osteoarthritis, Peripheral arterial disease ([place]), Proteinuria, Spinal stenosis at L4-L5 level, Thrombophlebitis of posterior tibial vein ([place]) (06/01/2014), and Trigeminal neuralgia (08/2016).  PSH:    Past Surgical History:  Procedure Laterality Date  . AORTOGRAM    . BACK SURGERY  11/13/2014  . CHOLECYSTECTOMY    . COLONOSCOPY WITH PROPOFOL N/A 08/15/2016   Procedure: COLONOSCOPY WITH PROPOFOL;  Surgeon: Lollie Sails, MD;  Location: [facility] ENDOSCOPY;  Service: Endoscopy;  Laterality: N/A;  . KNEE ARTHROSCOPY    . LOWER EXTREMITY ANGIOGRAM      Current Outpatient Medications  Medication Sig Dispense Refill  . amiodarone (PACERONE) 200 MG tablet TAKE 1 TABLET BY MOUTH ONCE DAILY 270 tablet 2  . amLODipine (NORVASC) 5 MG tablet Take 1 tablet (5 mg total) by mouth 2 (two) times daily. 60 tablet 0  . atenolol (TENORMIN) 25 MG tablet TAKE 1 TABLET BY MOUTH EVERY DAY  30 tablet 0  . atorvastatin (LIPITOR) 40 MG tablet TAKE 1 TABLET BY MOUTH ONCE DAILY 90 tablet 0  . ELIQUIS 5 MG TABS tablet TAKE 1 TABLET BY MOUTH TWICE DAILY 180 tablet 0  . lamoTRIgine (LAMICTAL) 100 MG tablet Take 1 tablet (100 mg total) by mouth 2 (two) times daily. 60 tablet 11  . losartan-hydrochlorothiazide (HYZAAR) 100-12.5 MG tablet Take 1  tablet by mouth daily. 90 tablet 0  . Oxcarbazepine (TRILEPTAL) 300 MG tablet Take 1 tablet (300 mg total) by mouth 2 (two) times daily. 120 tablet 11   No current facility-administered medications for this visit.      Allergies:   Patient has no known allergies.   Social History:  The patient  reports that she has never smoked. She has never used smokeless tobacco. She reports current alcohol use. She reports that she does not use drugs.   Family History:   family history includes Breast cancer in her paternal grandmother; Hypertension in her father and mother; Stroke in her father.    Review of Systems: Review of Systems  Constitutional: Negative.   HENT: Negative.        Pain right side of her head  Respiratory: Negative.   Cardiovascular: Negative.   Gastrointestinal: Negative.   Musculoskeletal: Negative.   Neurological: Negative.        Balance issues  Psychiatric/Behavioral: Negative.   All other systems reviewed and are negative.   PHYSICAL EXAM: VS:  BP (!) 144/80 (BP Location: Left Arm, Patient Position: Sitting, Cuff Size: Normal)   Pulse 69   Ht 5' (1.524 m)   Wt 208 lb 12 oz (94.7 kg)   BMI 40.77 kg/m  , BMI Body mass index is 40.77 kg/m. Constitutional:  oriented to person, place, and time. No distress.  HENT:  Head: Grossly normal Eyes:  no discharge. No scleral icterus.  Neck: No JVD, no carotid bruits  Cardiovascular: Regular rate and rhythm, no murmurs appreciated Pulmonary/Chest: Clear to auscultation bilaterally, no wheezes or rails Abdominal: Soft.  no distension.  no tenderness.  Musculoskeletal: Normal range of motion Neurological:  normal muscle tone. Coordination normal. No atrophy Skin: Skin warm and dry Psychiatric: normal affect, pleasant   Recent Labs: 07/15/2018: ALT 28; Hemoglobin 15.7; Platelets 217.0; TSH 1.04 08/30/2018: BUN 24; Creatinine, Ser 1.36; Potassium 4.9; Sodium 141   Lipid Panel Lab Results  Component Value Date    CHOL 153 07/15/2018   HDL 47.40 07/15/2018   LDLCALC 84 07/15/2018   TRIG 107.0 07/15/2018      Wt Readings from Last 3 Encounters:  05/31/19 208 lb 12 oz (94.7 kg)  05/26/19 209 lb 8 oz (95 kg)  08/30/18 212 lb 9.6 oz (96.4 kg)     ASSESSMENT AND PLAN:  Essential hypertension Blood pressure is well controlled on today's visit. No changes made to the medications.  Hypercholesterolemia Encouraged her to stay on her Lipitor Cholesterol at goal  Weight continues to run high, diet changes recommended  Morbid obesity ([place]) Recommended low carbohydrate diet Recommended regular walking program  Atrial fibrillation with RVR ([place]) - Plan: EKG 12-Lead  denies any symptoms concerning for arrhythmia Continue amiodarone and atenolol in the morning Cardizem and evening  Trigeminal neuralgia of left side of face Pain has returned, tumor returned    Total encounter time more than 25 minutes  Greater than 50% was spent in counseling and coordination of care with the patient   Disposition:   F/U  12 months  Orders Placed This Encounter  Procedures  . EKG 12-Lead     Signed, Esmond Plants, M.D., Ph.D. 05/31/2019  [place], [place]

## 2019-05-31 ENCOUNTER — Encounter: Payer: Self-pay | Admitting: Cardiovascular Disease

## 2019-05-31 ENCOUNTER — Ambulatory Visit (INDEPENDENT_AMBULATORY_CARE_PROVIDER_SITE_OTHER): Payer: 59 | Admitting: Cardiovascular Disease

## 2019-05-31 ENCOUNTER — Other Ambulatory Visit: Payer: Self-pay

## 2019-05-31 VITALS — BP 144/80 | HR 69 | Ht 60.0 in | Wt 208.8 lb

## 2019-05-31 DIAGNOSIS — R079 Chest pain, unspecified: Secondary | ICD-10-CM

## 2019-05-31 DIAGNOSIS — I739 Peripheral vascular disease, unspecified: Secondary | ICD-10-CM

## 2019-05-31 DIAGNOSIS — E7800 Pure hypercholesterolemia, unspecified: Secondary | ICD-10-CM | POA: Diagnosis not present

## 2019-05-31 DIAGNOSIS — I10 Essential (primary) hypertension: Secondary | ICD-10-CM | POA: Diagnosis not present

## 2019-05-31 DIAGNOSIS — I4891 Unspecified atrial fibrillation: Secondary | ICD-10-CM | POA: Diagnosis not present

## 2019-05-31 NOTE — Patient Instructions (Addendum)
Medication Instructions:  No changes  If you need a refill on your cardiac medications before your next appointment, please call your pharmacy.    Lab work: [employer] today   If you have labs (blood work) drawn today and your tests are completely normal, you will receive your results only by: Marland Kitchen MyChart Message (if you have MyChart) OR . A paper copy in the mail If you have any lab test that is abnormal or we need to change your treatment, we will call you to review the results.   Testing/Procedures: No new testing needed   Follow-Up: At [facility], you and your health needs are our priority.  As part of our continuing mission to provide you with exceptional heart care, we have created designated Provider Care Teams.  These Care Teams include your primary Cardiologist (physician) and Advanced Practice Providers (APPs -  Physician Assistants and Nurse Practitioners) who all work together to provide you with the care you need, when you need it.  . You will need a follow up appointment in 12 months .   Please call our office 2 months in advance to schedule this appointment.    . Providers on your designated Care Team:   . Murray Hodgkins, NP . Christell Faith, PA-C . Marrianne Mood, PA-C  Any Other Special Instructions Will Be Listed Below (If Applicable).  For educational health videos Log in to : [email or web address] Or : [email or web address], password : [email or web address]

## 2019-06-01 LAB — BASIC METABOLIC PANEL
BUN/Creatinine Ratio: 22 (ref 12–28)
BUN: 30 mg/dL — ABNORMAL HIGH (ref 8–27)
CO2: 23 mmol/L (ref 20–29)
Calcium: 9.5 mg/dL (ref 8.7–10.3)
Chloride: 103 mmol/L (ref 96–106)
Creatinine, Ser: 1.36 mg/dL — ABNORMAL HIGH (ref 0.57–1.00)
GFR calc Af Amer: 48 mL/min/{1.73_m2} — ABNORMAL LOW (ref >59)
GFR calc non Af Amer: 41 mL/min/{1.73_m2} — ABNORMAL LOW (ref >59)
Glucose: 106 mg/dL — ABNORMAL HIGH (ref 65–99)
Potassium: 4.3 mmol/L (ref 3.5–5.2)
Sodium: 141 mmol/L (ref 134–144)

## 2019-06-08 ENCOUNTER — Ambulatory Visit
Admission: RE | Admit: 2019-06-08 | Discharge: 2019-06-08 | Disposition: A | Discharge status: Home / Self Care | Payer: 59 | Source: Ambulatory Visit | Attending: Internal Medicine | Admitting: Internal Medicine

## 2019-06-08 DIAGNOSIS — Z1231 Encounter for screening mammogram for malignant neoplasm of breast: Secondary | ICD-10-CM | POA: Diagnosis not present

## 2019-06-16 ENCOUNTER — Other Ambulatory Visit: Payer: Self-pay | Admitting: Cardiovascular Disease

## 2019-06-16 NOTE — Telephone Encounter (Signed)
Please review for refill. Thanks!  

## 2019-06-21 ENCOUNTER — Telehealth: Payer: Self-pay | Admitting: Neurology

## 2019-06-21 MED ORDER — AMITRIPTYLINE HCL 25 MG PO TABS: 25.0000 mg | ORAL_TABLET | Freq: Every day | ORAL | 3 refills | Status: DC

## 2019-06-21 NOTE — Telephone Encounter (Signed)
Spoke with Dr. Felecia Shelling, he would like her to stay on oxcarbazepine for right now.

## 2019-06-21 NOTE — Telephone Encounter (Signed)
If the lamotrigine is helping at all, lets increase to 200 mg twice daily.  However, if it is not helping at all, then add amitriptyline 25 mg nightly.

## 2019-06-21 NOTE — Telephone Encounter (Signed)
Dr. Sater- what would you recommend? 

## 2019-06-21 NOTE — Telephone Encounter (Signed)
Called pt. Advised he would like her to stay on lamotrigine and oxcarbazepine. She will add amitriptyline 25mg  po qhs. She verbalized understanding. I escribed rx to pharmacy and she will call back in a few weeks to let us know how she is doing. Nothing further needed.

## 2019-06-21 NOTE — Telephone Encounter (Signed)
I called and spoke with patient. She feels lamotrigine has been ineffective for pain, she has had no improvement. She is agreeable to try adding amitriptyline. She wants to know if she should stay on oxcarbazepine since she will be adding amitriptyline or come off. Advised I will discuss with Dr. Felecia Shelling and call her back. She verbalized understanding.

## 2019-06-21 NOTE — Telephone Encounter (Signed)
Phone rep checked office voicemail;at 10:03 a.m. pt left voicemail stating she was asked to call after 5 weeks of being on medication to let Dr Felecia Shelling know how it worked for her.  Pt states the medication has not worked, please call

## 2019-06-21 NOTE — Addendum Note (Signed)
Addended by: Hope Pigeon on: 06/21/2019 03:19 PM   Modules accepted: Orders

## 2019-06-27 ENCOUNTER — Other Ambulatory Visit: Payer: Self-pay | Admitting: Cardiovascular Disease

## 2019-06-27 ENCOUNTER — Other Ambulatory Visit: Payer: Self-pay | Admitting: Internal Medicine

## 2019-06-29 ENCOUNTER — Other Ambulatory Visit: Payer: Self-pay | Admitting: Internal Medicine

## 2019-06-29 MED ORDER — AMLODIPINE BESYLATE 5 MG PO TABS: 5.0000 mg | ORAL_TABLET | Freq: Two times a day (BID) | ORAL | 0 refills | Status: DC

## 2019-06-29 NOTE — Addendum Note (Signed)
Addended by: Nash Mantis F on: 06/29/2019 12:28 PM   Modules accepted: Orders

## 2019-06-29 NOTE — Telephone Encounter (Signed)
Medication Refill - Medication: amLODipine (NORVASC) 5 MG tablet    Has the patient contacted their pharmacy? Yes.   (Agent: If no, request that the patient contact the pharmacy for the refill.) (Agent: If yes, when and what did the pharmacy advise?)  Preferred Pharmacy (with phone number or street name):  [id number] - [place], [place] - [address] [phone] (Phone) [phone] (Fax)     Agent: Please be advised that RX refills may take up to 3 business days. We ask that you follow-up with your pharmacy.

## 2019-06-29 NOTE — Telephone Encounter (Signed)
Called and informed pt that she will need to scheduled an office visit for further refills on amlodipine.  Pt said that she only has one pill left for today.  Scheduled pt an in-person office visit on 07/13/19 @ 8:30 am.  Refill sent to pharmacy for amlodipine for 30 day supply.

## 2019-07-01 ENCOUNTER — Other Ambulatory Visit: Payer: Self-pay | Admitting: *Deleted

## 2019-07-01 DIAGNOSIS — Z20822 Contact with and (suspected) exposure to covid-19: Secondary | ICD-10-CM

## 2019-07-02 LAB — NOVEL CORONAVIRUS, NAA: SARS-CoV-2, NAA: NOT DETECTED

## 2019-07-12 ENCOUNTER — Other Ambulatory Visit: Payer: Self-pay

## 2019-07-12 DIAGNOSIS — Z20822 Contact with and (suspected) exposure to covid-19: Secondary | ICD-10-CM

## 2019-07-13 ENCOUNTER — Ambulatory Visit: Payer: 59 | Admitting: Internal Medicine

## 2019-07-13 LAB — NOVEL CORONAVIRUS, NAA: SARS-CoV-2, NAA: NOT DETECTED

## 2019-07-18 ENCOUNTER — Ambulatory Visit: Payer: 59 | Admitting: Internal Medicine

## 2019-07-20 ENCOUNTER — Other Ambulatory Visit: Payer: Self-pay | Admitting: Internal Medicine

## 2019-07-20 MED ORDER — AMLODIPINE BESYLATE 5 MG PO TABS: 5.0000 mg | ORAL_TABLET | Freq: Two times a day (BID) | ORAL | 0 refills | Status: DC

## 2019-07-20 NOTE — Telephone Encounter (Signed)
Copied from [place] [id number]. Topic: Quick Communication - Rx Refill/Question >> Jul 20, 2019 12:00 PM Mcneil, Ja-Kwan wrote: Medication: amLODipine (NORVASC) 5 MG tablet  Has the patient contacted their pharmacy? yes   Preferred Pharmacy (with phone number or street name): [id number] - [place], [place] - [place] [phone] (Fax)  Agent: Please be advised that RX refills may take up to 3 business days. We ask that you follow-up with your pharmacy.

## 2019-07-20 NOTE — Telephone Encounter (Signed)
Requested medication (s) are due for refill today: yes  Requested medication (s) are on the active medication list: yes  Last refill:  06/24/2019  Future visit scheduled:yes  Notes to clinic:  Patient has appointment on 07/25/2019. Review for refill   Requested Prescriptions  Pending Prescriptions Disp Refills   amLODipine (NORVASC) 5 MG tablet 30 tablet 0    Sig: Take 1 tablet (5 mg total) by mouth 2 (two) times daily.     Cardiovascular:  Calcium Channel Blockers Failed - 07/20/2019 12:08 PM      Failed - Last BP in normal range    BP Readings from Last 1 Encounters:  05/31/19 (!) 144/80         Passed - Valid encounter within last 6 months    Recent Outpatient Visits          4 months ago Cellulitis of left lower extremity   [facility], Jacquelynn Cree, FNP   10 months ago Decreased GFR   [facility] [place], Randell Patient, MD   1 year ago Renal insufficiency   [facility] [place], Randell Patient, MD   1 year ago Atrial fibrillation with RVR [facility])   [facility] [place] Fleming, MD   2 years ago Routine general medical examination at a health care facility   [facility], MD      Future Appointments            In 5 days [name], MD [facility]

## 2019-07-25 ENCOUNTER — Ambulatory Visit (INDEPENDENT_AMBULATORY_CARE_PROVIDER_SITE_OTHER): Payer: 59 | Admitting: Internal Medicine

## 2019-07-25 ENCOUNTER — Encounter: Payer: Self-pay | Admitting: Internal Medicine

## 2019-07-25 ENCOUNTER — Other Ambulatory Visit: Payer: Self-pay

## 2019-07-25 DIAGNOSIS — D582 Other hemoglobinopathies: Secondary | ICD-10-CM

## 2019-07-25 DIAGNOSIS — N289 Disorder of kidney and ureter, unspecified: Secondary | ICD-10-CM | POA: Diagnosis not present

## 2019-07-25 DIAGNOSIS — R739 Hyperglycemia, unspecified: Secondary | ICD-10-CM

## 2019-07-25 DIAGNOSIS — I4891 Unspecified atrial fibrillation: Secondary | ICD-10-CM

## 2019-07-25 DIAGNOSIS — I10 Essential (primary) hypertension: Secondary | ICD-10-CM | POA: Diagnosis not present

## 2019-07-25 DIAGNOSIS — D329 Benign neoplasm of meninges, unspecified: Secondary | ICD-10-CM | POA: Diagnosis not present

## 2019-07-25 DIAGNOSIS — E7800 Pure hypercholesterolemia, unspecified: Secondary | ICD-10-CM | POA: Diagnosis not present

## 2019-07-25 MED ORDER — AMLODIPINE BESYLATE 5 MG PO TABS: 5.0000 mg | ORAL_TABLET | Freq: Two times a day (BID) | ORAL | 0 refills | Status: DC

## 2019-07-25 MED ORDER — LOSARTAN POTASSIUM-HCTZ 100-12.5 MG PO TABS: 1.0000 | ORAL_TABLET | Freq: Every day | ORAL | 1 refills | Status: DC

## 2019-07-25 NOTE — Progress Notes (Signed)
Patient ID: Kristin James, female   DOB: [birth year], 63 y.o.   MRN: [id number]   Virtual Visit via telephone Note  This visit type was conducted due to national recommendations for restrictions regarding the COVID-19 pandemic (e.g. social distancing).  This format is felt to be most appropriate for this patient at this time.  All issues noted in this document were discussed and addressed.  No physical exam was performed (except for noted visual exam findings with Video Visits).   I connected with Amelia Jo by telephone and verified that I am speaking with the correct person using two identifiers. Location patient: home Location provider: work  Persons participating in the telephone visit: patient, provider  I discussed the limitations, risks, security and privacy concerns of performing an evaluation and management service by telephone and the availability of in person appointments.  The patient expressed understanding and agreed to proceed.   Reason for visit: scheduled follow up.   HPI: Has a history of meningioma causing left trigeminal neuralgia.  Seeing neurology (Dr Felecia Shelling).  MRI 01/2019 - showed increase in tumor size.  Initially treated with steroids.  Also saw Dr Loura Back for evaluation for possible surgery.  He did not recommend surgery. Felt locations and size - increased risk.  Elected to monitor.  Being followed by neurology.  Increased pain recently.  They now have her on trileptal, lamictal and elavil.  This is helping her pain.  Tolerating. Pain much improved.  No chest pain.  No increased heart rate or palpitations.  Sees Dr Rockey Situ.  Stable.  Last evaluated 05/31/19.  No nausea or vomiting.  No abdominal pain.  Bowels moving.  Stated her blood pressure is doing well.  No chest pain.  No sob.  No acid reflux.  No abdominal pain.  Bowels moving.  Husband had covid.  She has tested negative x 3.  Has adjusted her diet.  Lost weight.  Overall she feels things are stable.     ROS: See  pertinent positives and negatives per HPI.  Past Medical History:  Diagnosis Date  . Embolus to lower extremity ([place])   . Hiatal hernia   . Hypercholesteremia   . Hyperglycemia   . Hypertension   . Osteoarthritis   . Peripheral arterial disease (H[facility])    embolic disease   . Proteinuria   . Spinal stenosis at L4-L5 level   . Thrombophlebitis of posterior tibial vein ([place]) 06/01/2014  . Trigeminal neuralgia 08/2016    Past Surgical History:  Procedure Laterality Date  . AORTOGRAM    . BACK SURGERY  11/13/2014  . CHOLECYSTECTOMY    . COLONOSCOPY WITH PROPOFOL N/A 08/15/2016   Procedure: COLONOSCOPY WITH PROPOFOL;  Surgeon: Lollie Sails, MD;  Location: [facility] ENDOSCOPY;  Service: Endoscopy;  Laterality: N/A;  . KNEE ARTHROSCOPY    . LOWER EXTREMITY ANGIOGRAM      Family History  Problem Relation Age of Onset  . Hypertension Mother   . Stroke Father        3 strokes  . Hypertension Father   . Breast cancer Paternal Grandmother   . Colon cancer Neg Hx     SOCIAL HX: reviewed.    Current Outpatient Medications:  .  amiodarone (PACERONE) 200 MG tablet, TAKE 1 TABLET BY MOUTH ONCE DAILY, Disp: 270 tablet, Rfl: 2 .  amitriptyline (ELAVIL) 25 MG tablet, Take 1 tablet (25 mg total) by mouth at bedtime., Disp: 30 tablet, Rfl: 3 .  amLODipine (NORVASC) 5 MG tablet,  Take 1 tablet (5 mg total) by mouth 2 (two) times daily., Disp: 90 tablet, Rfl: 0 .  atenolol (TENORMIN) 25 MG tablet, TAKE 1 TABLET BY MOUTH EVERY DAY, Disp: 30 tablet, Rfl: 2 .  atorvastatin (LIPITOR) 40 MG tablet, TAKE 1 TABLET BY MOUTH ONCE DAILY, Disp: 90 tablet, Rfl: 3 .  ELIQUIS 5 MG TABS tablet, TAKE 1 TABLET BY MOUTH TWICE DAILY, Disp: 180 tablet, Rfl: 0 .  lamoTRIgine (LAMICTAL) 100 MG tablet, Take 1 tablet (100 mg total) by mouth 2 (two) times daily., Disp: 60 tablet, Rfl: 11 .  losartan-hydrochlorothiazide (HYZAAR) 100-12.5 MG tablet, Take 1 tablet by mouth daily., Disp: 90 tablet, Rfl: 1 .  Oxcarbazepine  (TRILEPTAL) 300 MG tablet, Take 1 tablet (300 mg total) by mouth 2 (two) times daily., Disp: 120 tablet, Rfl: 11  EXAM:  VITALS per patient if applicable:  875/79  GENERAL: alert.  Answering questions appropriately.  Sounds to be in no acute distress.    PSYCH/NEURO: pleasant and cooperative, no obvious depression or anxiety, speech and thought processing grossly intact  ASSESSMENT AND PLAN:  Discussed the following assessment and plan:  Atrial fibrillation with RVR Followed by cardiology.  Stable.    Elevated hemoglobin (HCC) Follow cbc.   Hypercholesterolemia On lipitor.  Low cholesterol diet and exercise.  Follow lipid panel and liver function tests.    Hyperglycemia Low carb diet and exercsie.  Follow met b and a1c.   Hypertension Blood pressure under good control per her report.  Continue same medication regimen.  Follow pressures.  Follow metabolic panel.    Meningioma [facility]) Being followed by neurology.  Has had issues with increased pain from trigeminal neuralgia.  Pain under better control on lamictal, trileptal and elavil.  Recently evaluated by Dr Redmond Pulling (surgery).  Not felt to be a good surgical candidate at this time - secondary to size and location.  Follow.  Planning for f/u MRI in one year.    Renal insufficiency Needs to continue to avoid antiinflammatories.  Overdue labs.  Schedule fasting labs.      I discussed the assessment and treatment plan with the patient. The patient was provided an opportunity to ask questions and all were answered. The patient agreed with the plan and demonstrated an understanding of the instructions.   The patient was advised to call back or seek an in-person evaluation if the symptoms worsen or if the condition fails to improve as anticipated.  I provided 13 minutes of non-face-to-face time during this encounter.   [name], MD

## 2019-07-28 ENCOUNTER — Encounter: Payer: Self-pay | Admitting: Internal Medicine

## 2019-07-28 NOTE — Assessment & Plan Note (Signed)
On lipitor.  Low cholesterol diet and exercise.  Follow lipid panel and liver function tests.   

## 2019-07-28 NOTE — Assessment & Plan Note (Signed)
Followed by cardiology. Stable.   

## 2019-07-28 NOTE — Assessment & Plan Note (Signed)
Follow cbc.  

## 2019-07-28 NOTE — Assessment & Plan Note (Signed)
Needs to continue to avoid antiinflammatories.  Overdue labs.  Schedule fasting labs.

## 2019-07-28 NOTE — Assessment & Plan Note (Addendum)
Blood pressure under good control per her report.  Continue same medication regimen.  Follow pressures.  Follow metabolic panel.

## 2019-07-28 NOTE — Assessment & Plan Note (Signed)
Being followed by neurology.  Has had issues with increased pain from trigeminal neuralgia.  Pain under better control on lamictal, trileptal and elavil.  Recently evaluated by Dr Redmond Pulling (surgery).  Not felt to be a good surgical candidate at this time - secondary to size and location.  Follow.  Planning for f/u MRI in one year.

## 2019-07-28 NOTE — Assessment & Plan Note (Signed)
Low carb diet and exercsie.  Follow met b and a1c.

## 2019-08-23 ENCOUNTER — Other Ambulatory Visit: Payer: Self-pay

## 2019-08-23 ENCOUNTER — Other Ambulatory Visit (INDEPENDENT_AMBULATORY_CARE_PROVIDER_SITE_OTHER): Payer: Managed Care, Other (non HMO)

## 2019-08-23 DIAGNOSIS — D582 Other hemoglobinopathies: Secondary | ICD-10-CM | POA: Diagnosis not present

## 2019-08-23 DIAGNOSIS — I10 Essential (primary) hypertension: Secondary | ICD-10-CM | POA: Diagnosis not present

## 2019-08-23 DIAGNOSIS — E7800 Pure hypercholesterolemia, unspecified: Secondary | ICD-10-CM | POA: Diagnosis not present

## 2019-08-23 DIAGNOSIS — R739 Hyperglycemia, unspecified: Secondary | ICD-10-CM | POA: Diagnosis not present

## 2019-08-23 DIAGNOSIS — Z23 Encounter for immunization: Secondary | ICD-10-CM

## 2019-08-23 LAB — BASIC METABOLIC PANEL
BUN: 37 mg/dL — ABNORMAL HIGH (ref 6–23)
CO2: 26 mEq/L (ref 19–32)
Calcium: 9.2 mg/dL (ref 8.4–10.5)
Chloride: 105 mEq/L (ref 96–112)
Creatinine, Ser: 1.26 mg/dL — ABNORMAL HIGH (ref 0.40–1.20)
GFR: 42.73 mL/min — ABNORMAL LOW (ref >60.00)
Glucose, Bld: 104 mg/dL — ABNORMAL HIGH (ref 70–99)
Potassium: 4.3 mEq/L (ref 3.5–5.1)
Sodium: 140 mEq/L (ref 135–145)

## 2019-08-23 LAB — URINALYSIS, ROUTINE W REFLEX MICROSCOPIC
Bilirubin Urine: NEGATIVE
Hgb urine dipstick: NEGATIVE
Ketones, ur: NEGATIVE
Nitrite: NEGATIVE
RBC / HPF: NONE SEEN (ref 0–?)
Specific Gravity, Urine: 1.02 (ref 1.000–1.030)
Total Protein, Urine: NEGATIVE
Urine Glucose: NEGATIVE
Urobilinogen, UA: 0.2 (ref 0.0–1.0)
pH: 6 (ref 5.0–8.0)

## 2019-08-23 LAB — HEPATIC FUNCTION PANEL
ALT: 25 U/L (ref 0–35)
AST: 22 U/L (ref 0–37)
Albumin: 4.1 g/dL (ref 3.5–5.2)
Alkaline Phosphatase: 148 U/L — ABNORMAL HIGH (ref 39–117)
Bilirubin, Direct: 0.1 mg/dL (ref 0.0–0.3)
Total Bilirubin: 0.5 mg/dL (ref 0.2–1.2)
Total Protein: 6.8 g/dL (ref 6.0–8.3)

## 2019-08-23 LAB — CBC WITH DIFFERENTIAL/PLATELET
Basophils Absolute: 0.1 10*3/uL (ref 0.0–0.1)
Basophils Relative: 1.2 % (ref 0.0–3.0)
Eosinophils Absolute: 0.3 10*3/uL (ref 0.0–0.7)
Eosinophils Relative: 3.1 % (ref 0.0–5.0)
HCT: 42 % (ref 36.0–46.0)
Hemoglobin: 13.9 g/dL (ref 12.0–15.0)
Lymphocytes Relative: 22.6 % (ref 12.0–46.0)
Lymphs Abs: 1.9 10*3/uL (ref 0.7–4.0)
MCHC: 33 g/dL (ref 30.0–36.0)
MCV: 92.4 fl (ref 78.0–100.0)
Monocytes Absolute: 0.8 10*3/uL (ref 0.1–1.0)
Monocytes Relative: 10.2 % (ref 3.0–12.0)
Neutro Abs: 5.2 10*3/uL (ref 1.4–7.7)
Neutrophils Relative %: 62.9 % (ref 43.0–77.0)
Platelets: 217 10*3/uL (ref 150.0–400.0)
RBC: 4.54 Mil/uL (ref 3.87–5.11)
RDW: 12.8 % (ref 11.5–15.5)
WBC: 8.2 10*3/uL (ref 4.0–10.5)

## 2019-08-23 LAB — LIPID PANEL
Cholesterol: 170 mg/dL (ref 0–200)
HDL: 46.8 mg/dL (ref >39.00)
LDL Cholesterol: 88 mg/dL (ref 0–99)
NonHDL: 123.51
Total CHOL/HDL Ratio: 4
Triglycerides: 178 mg/dL — ABNORMAL HIGH (ref 0.0–149.0)
VLDL: 35.6 mg/dL (ref 0.0–40.0)

## 2019-08-23 LAB — HEMOGLOBIN A1C: Hgb A1c MFr Bld: 6.4 % (ref 4.6–6.5)

## 2019-08-23 LAB — TSH: TSH: 4.94 u[IU]/mL — ABNORMAL HIGH (ref 0.35–4.50)

## 2019-08-25 ENCOUNTER — Other Ambulatory Visit: Payer: Self-pay | Admitting: Internal Medicine

## 2019-08-25 DIAGNOSIS — R7989 Other specified abnormal findings of blood chemistry: Secondary | ICD-10-CM

## 2019-08-25 DIAGNOSIS — R748 Abnormal levels of other serum enzymes: Secondary | ICD-10-CM

## 2019-08-25 NOTE — Progress Notes (Signed)
Order placed for f/u labs.  

## 2019-08-29 ENCOUNTER — Other Ambulatory Visit: Payer: Self-pay

## 2019-08-30 ENCOUNTER — Other Ambulatory Visit (INDEPENDENT_AMBULATORY_CARE_PROVIDER_SITE_OTHER): Payer: Managed Care, Other (non HMO)

## 2019-08-30 DIAGNOSIS — R7989 Other specified abnormal findings of blood chemistry: Secondary | ICD-10-CM | POA: Diagnosis not present

## 2019-08-30 DIAGNOSIS — R748 Abnormal levels of other serum enzymes: Secondary | ICD-10-CM | POA: Diagnosis not present

## 2019-08-30 LAB — HEPATIC FUNCTION PANEL
ALT: 29 U/L (ref 0–35)
AST: 16 U/L (ref 0–37)
Albumin: 4.1 g/dL (ref 3.5–5.2)
Alkaline Phosphatase: 148 U/L — ABNORMAL HIGH (ref 39–117)
Bilirubin, Direct: 0.1 mg/dL (ref 0.0–0.3)
Total Bilirubin: 0.5 mg/dL (ref 0.2–1.2)
Total Protein: 6.5 g/dL (ref 6.0–8.3)

## 2019-08-30 LAB — TSH: TSH: 4.94 u[IU]/mL — ABNORMAL HIGH (ref 0.35–4.50)

## 2019-08-31 ENCOUNTER — Other Ambulatory Visit: Payer: Self-pay | Admitting: Internal Medicine

## 2019-08-31 DIAGNOSIS — R748 Abnormal levels of other serum enzymes: Secondary | ICD-10-CM

## 2019-08-31 NOTE — Progress Notes (Signed)
Patient notified and voiced understanding to labs , scheduled lab appt.

## 2019-08-31 NOTE — Progress Notes (Signed)
Order placed for f/u labs.  

## 2019-09-08 ENCOUNTER — Other Ambulatory Visit (INDEPENDENT_AMBULATORY_CARE_PROVIDER_SITE_OTHER): Payer: Managed Care, Other (non HMO)

## 2019-09-08 ENCOUNTER — Other Ambulatory Visit: Payer: Self-pay

## 2019-09-08 DIAGNOSIS — R748 Abnormal levels of other serum enzymes: Secondary | ICD-10-CM

## 2019-09-08 NOTE — Addendum Note (Signed)
Addended by: Leeanne Rio on: 09/08/2019 10:35 AM   Modules accepted: Orders

## 2019-09-12 ENCOUNTER — Other Ambulatory Visit: Payer: Self-pay | Admitting: Cardiovascular Disease

## 2019-09-12 NOTE — Telephone Encounter (Signed)
Pt's age 64, wt 89.4 kg, SCr 1.26, CrCl 63.66, last ov w/ TG 05/31/19.

## 2019-09-12 NOTE — Telephone Encounter (Signed)
Refill request for Eliquis

## 2019-09-13 LAB — ALKALINE PHOSPHATASE, ISOENZYMES
Alkaline Phosphatase: 197 IU/L — ABNORMAL HIGH (ref 39–117)
BONE FRACTION: 24 % (ref 14–68)
INTESTINAL FRAC.: 14 % (ref 0–18)
LIVER FRACTION: 62 % (ref 18–85)

## 2019-09-13 LAB — GAMMA GT: GGT: 56 IU/L (ref 0–60)

## 2019-09-16 ENCOUNTER — Other Ambulatory Visit: Payer: Self-pay

## 2019-09-16 ENCOUNTER — Telehealth: Payer: Self-pay | Admitting: Internal Medicine

## 2019-09-16 MED ORDER — AMLODIPINE BESYLATE 5 MG PO TABS: 5.0000 mg | ORAL_TABLET | Freq: Two times a day (BID) | ORAL | 1 refills | Status: DC

## 2019-09-16 NOTE — Telephone Encounter (Signed)
Rx refilled pt aware 

## 2019-09-16 NOTE — Telephone Encounter (Signed)
Pt needs a refill on amLODipine (NORVASC) 5 MG tablet sent to [facility] She is requesting a 90 day supply. She is completely out

## 2019-09-17 ENCOUNTER — Other Ambulatory Visit: Payer: Self-pay | Admitting: Internal Medicine

## 2019-09-17 DIAGNOSIS — R748 Abnormal levels of other serum enzymes: Secondary | ICD-10-CM

## 2019-09-17 NOTE — Progress Notes (Signed)
Order placed for abdominal ultrasound and for GI referral.

## 2019-09-22 ENCOUNTER — Other Ambulatory Visit: Payer: Self-pay

## 2019-09-22 ENCOUNTER — Telehealth: Payer: Self-pay | Admitting: Internal Medicine

## 2019-09-22 ENCOUNTER — Ambulatory Visit
Admission: RE | Admit: 2019-09-22 | Discharge: 2019-09-22 | Disposition: A | Discharge status: Home / Self Care | Payer: Managed Care, Other (non HMO) | Source: Ambulatory Visit | Attending: Internal Medicine | Admitting: Internal Medicine

## 2019-09-22 DIAGNOSIS — R748 Abnormal levels of other serum enzymes: Secondary | ICD-10-CM | POA: Diagnosis not present

## 2019-09-22 NOTE — Telephone Encounter (Signed)
Noted  

## 2019-09-22 NOTE — Telephone Encounter (Signed)
Pt wanted to let you know that she was able to find her results and not to worry about calling her back.

## 2019-09-22 NOTE — Telephone Encounter (Signed)
Received another note stating to disregard message.

## 2019-09-22 NOTE — Telephone Encounter (Signed)
Pt called and would like to get her lab results.   Please call pt @ [phone].

## 2019-09-23 ENCOUNTER — Other Ambulatory Visit: Payer: Self-pay | Admitting: Internal Medicine

## 2019-09-27 ENCOUNTER — Encounter: Payer: Self-pay | Admitting: *Deleted

## 2019-10-05 ENCOUNTER — Other Ambulatory Visit: Payer: Self-pay | Admitting: Cardiovascular Disease

## 2019-10-10 ENCOUNTER — Other Ambulatory Visit: Payer: Self-pay | Admitting: Cardiovascular Disease

## 2019-10-10 MED ORDER — ATENOLOL 25 MG PO TABS: 25.0000 mg | ORAL_TABLET | Freq: Every day | ORAL | 0 refills | Status: DC

## 2019-10-10 NOTE — Telephone Encounter (Signed)
*  STAT* If patient is at the pharmacy, call can be transferred to refill team.   1. Which medications need to be refilled? (please list name of each medication and dose if known) atenolol 25mg  daily  2. Which pharmacy/location (including street and city if local pharmacy) is medication to be sent to? [address]  3. Do they need a 30 day or 90 day supply? [place]

## 2019-10-14 ENCOUNTER — Other Ambulatory Visit: Payer: Self-pay | Admitting: Neurology

## 2019-10-27 ENCOUNTER — Other Ambulatory Visit: Payer: Self-pay | Admitting: Gastroenterology

## 2019-10-27 DIAGNOSIS — K838 Other specified diseases of biliary tract: Secondary | ICD-10-CM | POA: Diagnosis not present

## 2019-10-27 DIAGNOSIS — R748 Abnormal levels of other serum enzymes: Secondary | ICD-10-CM | POA: Diagnosis not present

## 2019-10-27 DIAGNOSIS — D369 Benign neoplasm, unspecified site: Secondary | ICD-10-CM | POA: Diagnosis not present

## 2019-11-05 ENCOUNTER — Ambulatory Visit: Payer: 59 | Attending: Internal Medicine

## 2019-11-05 DIAGNOSIS — Z23 Encounter for immunization: Secondary | ICD-10-CM | POA: Insufficient documentation

## 2019-11-05 NOTE — Progress Notes (Signed)
   Covid-19 Vaccination Clinic  Name:  ALANA BOZZELLI    MRN: [id number] DOB: [birth year]  11/05/2019  Ms. Azlin was observed post Covid-19 immunization for 15 minutes without incident. She was provided with Vaccine Information Sheet and instruction to access the V-Safe system.   Ms. Ramseur was instructed to call 911 with any severe reactions post vaccine: Marland Kitchen Difficulty breathing  . Swelling of face and throat  . A fast heartbeat  . A bad rash all over body  . Dizziness and weakness   Immunizations Administered    Name Date Dose VIS Date Route   Moderna COVID-19 Vaccine 11/05/2019  3:10 PM 0.5 mL 08/02/2019 Intramuscular   Manufacturer: Moderna   Lot: [id number]   [id number]

## 2019-11-07 ENCOUNTER — Other Ambulatory Visit: Payer: Self-pay

## 2019-11-07 ENCOUNTER — Ambulatory Visit
Admission: RE | Admit: 2019-11-07 | Discharge: 2019-11-07 | Disposition: A | Discharge status: Home / Self Care | Payer: 59 | Source: Ambulatory Visit | Attending: Gastroenterology | Admitting: Gastroenterology

## 2019-11-07 DIAGNOSIS — R748 Abnormal levels of other serum enzymes: Secondary | ICD-10-CM | POA: Diagnosis not present

## 2019-11-07 DIAGNOSIS — K838 Other specified diseases of biliary tract: Secondary | ICD-10-CM | POA: Insufficient documentation

## 2019-11-07 DIAGNOSIS — R935 Abnormal findings on diagnostic imaging of other abdominal regions, including retroperitoneum: Secondary | ICD-10-CM | POA: Diagnosis not present

## 2019-11-07 LAB — POCT I-STAT CREATININE: Creatinine, Ser: 1.1 mg/dL — ABNORMAL HIGH (ref 0.44–1.00)

## 2019-11-07 MED ORDER — GADOBUTROL 1 MMOL/ML IV SOLN
8.0000 mL | Freq: Once | INTRAVENOUS | Status: AC | PRN
  Administered 2019-11-07: 8 mL via INTRAVENOUS

## 2019-11-23 ENCOUNTER — Ambulatory Visit (INDEPENDENT_AMBULATORY_CARE_PROVIDER_SITE_OTHER): Payer: 59 | Admitting: Neurology

## 2019-11-23 ENCOUNTER — Encounter: Payer: Self-pay | Admitting: Neurology

## 2019-11-23 ENCOUNTER — Other Ambulatory Visit: Payer: Self-pay

## 2019-11-23 VITALS — BP 126/65 | HR 59 | Temp 96.3°F | Ht 60.0 in | Wt 206.0 lb

## 2019-11-23 DIAGNOSIS — D329 Benign neoplasm of meninges, unspecified: Secondary | ICD-10-CM | POA: Diagnosis not present

## 2019-11-23 DIAGNOSIS — R260 Ataxic gait: Secondary | ICD-10-CM | POA: Diagnosis not present

## 2019-11-23 DIAGNOSIS — Z923 Personal history of irradiation: Secondary | ICD-10-CM

## 2019-11-23 DIAGNOSIS — G500 Trigeminal neuralgia: Secondary | ICD-10-CM | POA: Diagnosis not present

## 2019-11-23 NOTE — Progress Notes (Signed)
[facility]  PATIENT: Kristin James DOB: Jan 22, 1956  REFERRING DOCTOR OR PCP:  [name] SOURCE: Patient, notes from Dr. Nicki Reaper,  _________________________________   HISTORICAL  CHIEF COMPLAINT:  Chief Complaint  Patient presents with  . Follow-up    She is here with her husband, Kristin James. She is following up for meningioma, leg weakness and left trigeminal neuralgia. Her leg weakness is unchanged. Feels it causes her issues with balance. Her left-facial pain is under control with her current medication regimen.     HISTORY OF PRESENT ILLNESS:  Kristin James is a 64 y.o. woman with a meningioma causing left trigeminal neuralgia.  Update 11/23/2019: She has a left cerebellopontine angle meningioma that has been treated with gamma knife in the past.  She feels her pain is very well controlled on hr current medications.  She is currently on lamotrigine 100 mg po bid and oxcarbazepine 300 mg bid and amitriptyline 24 mg nightyly.  She had gamma knofe 10/2016.   Recent MRIs show no further growth.   Her MRIs were also looked at by a group [facility].   [facility] (Dr. Redmond Pulling) reportedly told her that she is not a surgical candidate and gamma knife could not be done a second time..       She continues to have problems with balance.  She has some falls.     Update 05/26/2019: She had the gamma knife procedure 10/2016.  I saw her 04/24/2019.    We adjusted some medication dose and her pain improved.    She had trouble tolerating gabapentin and she was switched to oxcarbazepine.     In February 2020, pain started to worsen again.   MRI June 2020 showed no increase in the tumor size.   She was placed on steroids without much benefit.  Tumor was actually mildly lower.    She saw Dr. Redmond Pulling about possibility of surgery.   He did not recommend surgery as he could not completely remove it and complication risk is high.  The last MRI shows slight decrease in size of tumor.   MRI  03/23/2019 [facility]) 1. Redemonstrated extra-axial dural based mass along the left cerebellar pontine angle with extension into the left internal auditory canal, left Meckel's cave, and Pars Nervosa of the left jugular foramen which is similar to possibly slightly decreased in size from exams dated 07/09/2018 and 07/08/2017. 2. No significant change in surrounding adjacent mass effect, particularly on the middle cerebellar peduncle and medulla/pons.  3. No hydrocephalus. 4. No acute intracranial abnormality.  From 04/23/2017: She is a 64 year old woman who began to experience a dull ache in the left jaw region in mid 2017. Initially, she felt her symptoms were due to a dental issue and she had a tooth removed and a repeat root canal. However, there was no improvement. A few months later she began to experience short shock like severe pain in the left jaw. Her primary care provider, Dr. Nicki Reaper started gabapentin and referred her to Dr. Melrose Nakayama in [place].   She had a dull pain and would get frequent zaps of severe pain into the left cheek.  When pain was more severe, she couldn't talk.   Brushing her teeth would trigger the most painful sensations.   She was initially placed on gabapentin 100 mg several times a day by her PCP and then Tegretol was added. The Tegretol was switched to Trileptal for tolerability. She continued to have pain and was referred to neurosurgery  at [facility] for evaluation of gamma knife procedures after an MRI of the brain showed that she had a meningioma that involved the facial nerve region.    She underwent Gamma knife stereotactic radiosurgery by Dr. Arlan Organ and Dr. Vallarie Mare at [facility] neurosurgery and radiation oncology at a dose of 20 Gy was delivered to the outlined tumor divided into 4 fractions.   She felt pain improved some initially but did not resolve.   Pain worsened in July, and gabapentin and oxcarbazepine were both increased from 300 mg po tid to 300 mg po qid  and 300 mg po tid.    She is tolerating her medications fairly well right now. She does feel little bit off balance at times and still does not feel as sharp as she did before all of the medications. She has only been on the higher dose for several days and continues to experience symptoms though they are better than they were a couple weeks ago.  She stopped driving 3-4 weeks ago due to visual changes and a foggy sensation and slurred speech that was bad x 2 weeks but now better.   She still feels memory is bad  I reviewed imaging reports. The MRI of the brain 10/01/2016 performed at [facility] shows a large left cerebellopontine angle meningioma extending into the left internal auditory canal. There is mass effect on the pons and left vertebral artery. The tumor crosses the expected path of the left cranial nerves V, VI, VII and VIII. also reviewed the MRI of the brain 11/17/2016 showing the same mass and mentioning redemonstration of encephalomalacia in the left parieto-occipital region and lacunar infarction in the right cerebellum and thalamus. REVIEW OF SYSTEMS: Constitutional: No fevers, chills, sweats, or change in appetite Eyes: No visual changes, double vision, eye pain Ear, nose and throat: No hearing loss, ear pain, nasal congestion, sore throat Cardiovascular: No chest pain, palpitations Respiratory: No shortness of breath at rest or with exertion.   No wheezes GastrointestinaI: No nausea, vomiting, diarrhea, abdominal pain, fecal incontinence Genitourinary: No dysuria, urinary retention or frequency.  No nocturia. Musculoskeletal: No neck pain, back pain Integumentary: No rash, pruritus, skin lesions Neurological: as above Psychiatric: No depression at this time.  No anxiety Endocrine: No palpitations, diaphoresis, change in appetite, change in weigh or increased thirst Hematologic/Lymphatic: No anemia, purpura, petechiae. Allergic/Immunologic: No itchy/runny eyes, nasal  congestion, recent allergic reactions, rashes  ALLERGIES: No Known Allergies  HOME MEDICATIONS:  Current Outpatient Medications:  .  amiodarone (PACERONE) 200 MG tablet, TAKE 1 TABLET BY MOUTH ONCE DAILY, Disp: 90 tablet, Rfl: 2 .  amitriptyline (ELAVIL) 25 MG tablet, TAKE 1 TABLET(25 MG) BY MOUTH AT BEDTIME, Disp: 30 tablet, Rfl: 3 .  amLODipine (NORVASC) 5 MG tablet, Take 1 tablet (5 mg total) by mouth 2 (two) times daily., Disp: 180 tablet, Rfl: 1 .  atenolol (TENORMIN) 25 MG tablet, Take 1 tablet (25 mg total) by mouth daily., Disp: 90 tablet, Rfl: 0 .  atorvastatin (LIPITOR) 40 MG tablet, TAKE 1 TABLET BY MOUTH ONCE DAILY, Disp: 90 tablet, Rfl: 3 .  ELIQUIS 5 MG TABS tablet, TAKE 1 TABLET BY MOUTH TWICE DAILY, Disp: 180 tablet, Rfl: 0 .  lamoTRIgine (LAMICTAL) 100 MG tablet, Take 1 tablet (100 mg total) by mouth 2 (two) times daily., Disp: 60 tablet, Rfl: 11 .  losartan-hydrochlorothiazide (HYZAAR) 100-12.5 MG tablet, Take 1 tablet by mouth daily., Disp: 90 tablet, Rfl: 1 .  Oxcarbazepine (TRILEPTAL) 300 MG tablet, Take  1 tablet (300 mg total) by mouth 2 (two) times daily., Disp: 120 tablet, Rfl: 11  PAST MEDICAL HISTORY: Past Medical History:  Diagnosis Date  . Embolus to lower extremity ([place])   . Hiatal hernia   . Hypercholesteremia   . Hyperglycemia   . Hypertension   . Osteoarthritis   . Peripheral arterial disease (HCC)    embolic disease   . Proteinuria   . Spinal stenosis at L4-L5 level   . Thrombophlebitis of posterior tibial vein ([place]) 06/01/2014  . Trigeminal neuralgia 08/2016    PAST SURGICAL HISTORY: Past Surgical History:  Procedure Laterality Date  . AORTOGRAM    . BACK SURGERY  11/13/2014  . CHOLECYSTECTOMY    . COLONOSCOPY WITH PROPOFOL N/A 08/15/2016   Procedure: COLONOSCOPY WITH PROPOFOL;  Surgeon: Lollie Sails, MD;  Location: [facility] ENDOSCOPY;  Service: Endoscopy;  Laterality: N/A;  . KNEE ARTHROSCOPY    . LOWER EXTREMITY ANGIOGRAM      FAMILY  HISTORY: Family History  Problem Relation Age of Onset  . Hypertension Mother   . Stroke Father        3 strokes  . Hypertension Father   . Breast cancer Paternal Grandmother   . Colon cancer Neg Hx     SOCIAL HISTORY:  Social History   Socioeconomic History  . Marital status: Married    Spouse name: Not on file  . Number of children: 2  . Years of education: Not on file  . Highest education level: Not on file  Occupational History  . Not on file  Tobacco Use  . Smoking status: Never Smoker  . Smokeless tobacco: Never Used  Substance and Sexual Activity  . Alcohol use: Yes    Alcohol/week: 0.0 standard drinks    Comment: occ. wine  . Drug use: No  . Sexual activity: Not on file  Other Topics Concern  . Not on file  Social History Narrative   Right handed    Caffeine use: coffee (1/2 cup per day)   Social Determinants of Health   Financial Resource Strain:   . Difficulty of Paying Living Expenses:   Food Insecurity:   . Worried About Charity fundraiser in the Last Year:   . Arboriculturist in the Last Year:   Transportation Needs:   . Film/video editor (Medical):   Marland Kitchen Lack of Transportation (Non-Medical):   Physical Activity:   . Days of Exercise per Week:   . Minutes of Exercise per Session:   Stress:   . Feeling of Stress :   Social Connections:   . Frequency of Communication with Friends and Family:   . Frequency of Social Gatherings with Friends and Family:   . Attends Religious Services:   . Active Member of Clubs or Organizations:   . Attends Archivist Meetings:   Marland Kitchen Marital Status:   Intimate Partner Violence:   . Fear of Current or Ex-Partner:   . Emotionally Abused:   Marland Kitchen Physically Abused:   . Sexually Abused:      PHYSICAL EXAM  Vitals:   11/23/19 1129  BP: 126/65  Pulse: (!) 59  Temp: (!) 96.3 F (35.7 C)  Weight: 206 lb (93.4 kg)  Height: 5' (1.524 m)    Body mass index is 40.23 kg/m.   General: The patient  is well-developed and well-nourished and in no acute distress  Skin:  No rashes  Neurologic Exam  Mental status: The patient is alert  and oriented x 3 at the time of the examination. The patient has apparent normal recent and remote memory, with an apparently normal attention span and concentration ability.   Speech is normal.  Cranial nerves: Extraocular movements are full. Facial symmetry is present.  She has good facial sensation to touch bilateral.  Facial strength is normal.  Trapezius and sternocleidomastoid strength is normal. No dysarthria is noted.   No obvious hearing deficits are noted.  Motor:  Muscle bulk is normal.   Tone is normal. Strength is  5 / 5 in all 4 extremities.   Sensory: Sensory testing is intact to touch and vibration sensation in all 4 extremities.  Coordination: Cerebellar testing reveals good finger-nose-finger and heel-to-shin bilaterally.  Gait and station: Station is normal.   Gait is mildly wide..  Tandem gait is wide.. Romberg is negative.   Reflexes: Deep tendon reflexes are symmetric and normal bilaterally.       DIAGNOSTIC DATA (LABS, IMAGING, TESTING) - I reviewed patient records, labs, notes, testing and imaging myself where available.  Lab Results  Component Value Date   WBC 8.2 08/23/2019   HGB 13.9 08/23/2019   HCT 42.0 08/23/2019   MCV 92.4 08/23/2019   PLT 217.0 08/23/2019      Component Value Date/Time   NA 140 08/23/2019 0811   NA 141 05/31/2019 1600   NA 142 05/23/2014 0540   K 4.3 08/23/2019 0811   K 3.7 05/23/2014 0540   CL 105 08/23/2019 0811   CL 114 (H) 05/23/2014 0540   CO2 26 08/23/2019 0811   CO2 21 05/23/2014 0540   GLUCOSE 104 (H) 08/23/2019 0811   GLUCOSE 90 05/23/2014 0540   BUN 37 (H) 08/23/2019 0811   BUN 30 (H) 05/31/2019 1600   BUN 16 05/23/2014 0540   CREATININE 1.10 (H) 11/07/2019 1644   CREATININE 1.07 05/23/2014 0540   CALCIUM 9.2 08/23/2019 0811   CALCIUM 7.4 (L) 05/23/2014 0540   PROT 6.5  08/30/2019 0811   PROT 7.4 05/17/2013 1028   ALBUMIN 4.1 08/30/2019 0811   ALBUMIN 3.6 05/17/2013 1028   AST 16 08/30/2019 0811   AST 28 05/17/2013 1028   ALT 29 08/30/2019 0811   ALT 27 05/17/2013 1028   ALKPHOS 197 (H) 09/08/2019 1035   ALKPHOS 103 05/17/2013 1028   BILITOT 0.5 08/30/2019 0811   BILITOT 0.8 05/17/2013 1028   GFRNONAA 41 (L) 05/31/2019 1600   GFRNONAA 57 (L) 05/23/2014 0540   GFRAA 48 (L) 05/31/2019 1600   GFRAA >60 05/23/2014 0540   Lab Results  Component Value Date   CHOL 170 08/23/2019   HDL 46.80 08/23/2019   LDLCALC 88 08/23/2019   TRIG 178.0 (H) 08/23/2019   CHOLHDL 4 08/23/2019   Lab Results  Component Value Date   HGBA1C 6.4 08/23/2019   No results found for: [id number] Lab Results  Component Value Date   TSH 4.94 (H) 08/30/2019       ASSESSMENT AND PLAN   Meningioma (HCC)  Trigeminal neuralgia of left side of face  Status post gamma knife treatment  Ataxic gait  1.  Continue lamotrigine 100 mg twice daily, oxcarbazepine 300 mg twice daily and amitriptyline nightly. 2.  If pain worsens or if gait worsens, additional treatment of the meningioma, either gamma knife or surgery, may be necessary. 3.   Stay active and exercise as tolerated. 4.   RTC 6 months, sooner if problems  Layth Cerezo A. Felecia Shelling, MD, [facility] [id number], [id number] PM Certified in  Neurology, Clinical Neurophysiology, Sleep Medicine, Pain Medicine and Neuroimaging  [facility] [address], [place], [place] [address] [phone]

## 2019-11-29 ENCOUNTER — Ambulatory Visit (INDEPENDENT_AMBULATORY_CARE_PROVIDER_SITE_OTHER): Payer: 59 | Admitting: Internal Medicine

## 2019-11-29 ENCOUNTER — Other Ambulatory Visit: Payer: Self-pay

## 2019-11-29 ENCOUNTER — Other Ambulatory Visit (HOSPITAL_COMMUNITY)
Admission: RE | Admit: 2019-11-29 | Discharge: 2019-11-29 | Disposition: A | Discharge status: Home / Self Care | Payer: 59 | Source: Ambulatory Visit | Attending: Internal Medicine | Admitting: Internal Medicine

## 2019-11-29 VITALS — BP 134/78 | HR 63 | Temp 97.3°F | Resp 16 | Ht 60.0 in | Wt 206.0 lb

## 2019-11-29 DIAGNOSIS — E7800 Pure hypercholesterolemia, unspecified: Secondary | ICD-10-CM

## 2019-11-29 DIAGNOSIS — R739 Hyperglycemia, unspecified: Secondary | ICD-10-CM

## 2019-11-29 DIAGNOSIS — N760 Acute vaginitis: Secondary | ICD-10-CM | POA: Insufficient documentation

## 2019-11-29 DIAGNOSIS — I4891 Unspecified atrial fibrillation: Secondary | ICD-10-CM

## 2019-11-29 DIAGNOSIS — I10 Essential (primary) hypertension: Secondary | ICD-10-CM

## 2019-11-29 DIAGNOSIS — Z6841 Body Mass Index (BMI) 40.0 and over, adult: Secondary | ICD-10-CM | POA: Diagnosis not present

## 2019-11-29 DIAGNOSIS — M5440 Lumbago with sciatica, unspecified side: Secondary | ICD-10-CM | POA: Diagnosis not present

## 2019-11-29 DIAGNOSIS — D582 Other hemoglobinopathies: Secondary | ICD-10-CM

## 2019-11-29 DIAGNOSIS — Z0000 Encounter for general adult medical examination without abnormal findings: Secondary | ICD-10-CM | POA: Diagnosis not present

## 2019-11-29 DIAGNOSIS — R748 Abnormal levels of other serum enzymes: Secondary | ICD-10-CM | POA: Diagnosis not present

## 2019-11-29 DIAGNOSIS — N289 Disorder of kidney and ureter, unspecified: Secondary | ICD-10-CM

## 2019-11-29 DIAGNOSIS — Z124 Encounter for screening for malignant neoplasm of cervix: Secondary | ICD-10-CM

## 2019-11-29 DIAGNOSIS — N898 Other specified noninflammatory disorders of vagina: Secondary | ICD-10-CM

## 2019-11-29 DIAGNOSIS — D329 Benign neoplasm of meninges, unspecified: Secondary | ICD-10-CM

## 2019-11-29 DIAGNOSIS — I739 Peripheral vascular disease, unspecified: Secondary | ICD-10-CM

## 2019-11-29 MED ORDER — NYSTATIN 100000 UNIT/GM EX CREA: 1.0000 "application " | TOPICAL_CREAM | Freq: Two times a day (BID) | CUTANEOUS | 0 refills | Status: DC

## 2019-11-29 NOTE — Progress Notes (Signed)
Patient ID: Kristin James, female   DOB: 22-Feb-1956, 64 y.o.   MRN: [id number]   Subjective:    Patient ID: Kristin James, female    DOB: 01-Jul-1956, 64 y.o.   MRN: [id number]  HPI This visit occurred during the SARS-CoV-2 public health emergency.  Safety protocols were in place, including screening questions prior to the visit, additional usage of staff PPE, and extensive cleaning of exam room while observing appropriate contact time as indicated for disinfecting solutions.  Patient here for her physical exam.  Has been followed by neurology for history of meningioma.  S/p gamma knife.  On lamictal, oxycarbazepine and amitriptyline.  Balance has been an issue.  Last evaluated by Dr Felecia Shelling 11/23/19.  Recommended f/u in 6 months.  Pain controlled.  Also evaluated by GI for elevated alkaline phos and dilated bile duct.  Had negative MRI/MRCP.  Sees cardiology (Dr Rockey Situ) for f/u afib.  Continues on amiodarone and atenolol in the morning.  Doing well from a cardiac standpoint.  No increased heart rate or palpitations reported.  No chest pain or sob.  No acid reflux reported.  No abdominal pain or bowel change.  Blood pressures 120s/60s.  Does report some vaginal itching.     Past Medical History:  Diagnosis Date  . Embolus to lower extremity ([place])   . Hiatal hernia   . Hypercholesteremia   . Hyperglycemia   . Hypertension   . Osteoarthritis   . Peripheral arterial disease (HCC)    embolic disease   . Proteinuria   . Spinal stenosis at L4-L5 level   . Thrombophlebitis of posterior tibial vein ([place]) 06/01/2014  . Trigeminal neuralgia 08/2016   Past Surgical History:  Procedure Laterality Date  . AORTOGRAM    . BACK SURGERY  11/13/2014  . CHOLECYSTECTOMY    . COLONOSCOPY WITH PROPOFOL N/A 08/15/2016   Procedure: COLONOSCOPY WITH PROPOFOL;  Surgeon: Lollie Sails, MD;  Location[facility] ENDOSCOPY;  Service: Endoscopy;  Laterality: N/A;  . KNEE ARTHROSCOPY    . LOWER EXTREMITY ANGIOGRAM      Family History  Problem Relation Age of Onset  . Hypertension Mother   . Stroke Father        3 strokes  . Hypertension Father   . Breast cancer Paternal Grandmother   . Colon cancer Neg Hx    Social History   Socioeconomic History  . Marital status: Married    Spouse name: Not on file  . Number of children: 2  . Years of education: Not on file  . Highest education level: Not on file  Occupational History  . Not on file  Tobacco Use  . Smoking status: Never Smoker  . Smokeless tobacco: Never Used  Substance and Sexual Activity  . Alcohol use: Yes    Alcohol/week: 0.0 standard drinks    Comment: occ. wine  . Drug use: No  . Sexual activity: Not on file  Other Topics Concern  . Not on file  Social History Narrative   Right handed    Caffeine use: coffee (1/2 cup per day)   Social Determinants of Health   Financial Resource Strain:   . Difficulty of Paying Living Expenses:   Food Insecurity:   . Worried About Charity fundraiser in the Last Year:   . Arboriculturist in the Last Year:   Transportation Needs:   . Film/video editor (Medical):   Marland Kitchen Lack of Transportation (Non-Medical):   Physical Activity:   .  Days of Exercise per Week:   . Minutes of Exercise per Session:   Stress:   . Feeling of Stress :   Social Connections:   . Frequency of Communication with Friends and Family:   . Frequency of Social Gatherings with Friends and Family:   . Attends Religious Services:   . Active Member of Clubs or Organizations:   . Attends Archivist Meetings:   Marland Kitchen Marital Status:     Outpatient Encounter Medications as of 11/29/2019  Medication Sig  . amiodarone (PACERONE) 200 MG tablet TAKE 1 TABLET BY MOUTH ONCE DAILY  . amitriptyline (ELAVIL) 25 MG tablet TAKE 1 TABLET(25 MG) BY MOUTH AT BEDTIME  . amLODipine (NORVASC) 5 MG tablet Take 1 tablet (5 mg total) by mouth 2 (two) times daily.  Marland Kitchen atenolol (TENORMIN) 25 MG tablet Take 1 tablet (25 mg total) by  mouth daily.  Marland Kitchen atorvastatin (LIPITOR) 40 MG tablet TAKE 1 TABLET BY MOUTH ONCE DAILY  . ELIQUIS 5 MG TABS tablet TAKE 1 TABLET BY MOUTH TWICE DAILY  . lamoTRIgine (LAMICTAL) 100 MG tablet Take 1 tablet (100 mg total) by mouth 2 (two) times daily.  Marland Kitchen losartan-hydrochlorothiazide (HYZAAR) 100-12.5 MG tablet Take 1 tablet by mouth daily.  . Oxcarbazepine (TRILEPTAL) 300 MG tablet Take 1 tablet (300 mg total) by mouth 2 (two) times daily.  Marland Kitchen nystatin cream (MYCOSTATIN) Apply 1 application topically 2 (two) times daily.   No facility-administered encounter medications on file as of 11/29/2019.    Review of Systems  Constitutional: Negative for appetite change and unexpected weight change.  HENT: Negative for congestion and sinus pressure.   Eyes: Negative for pain and visual disturbance.  Respiratory: Negative for cough, chest tightness and shortness of breath.   Cardiovascular: Negative for chest pain, palpitations and leg swelling.  Gastrointestinal: Negative for abdominal pain, diarrhea, nausea and vomiting.  Genitourinary: Negative for difficulty urinating and dysuria.       Vaginal itching.   Musculoskeletal: Negative for joint swelling and myalgias.  Skin: Negative for color change and rash.  Neurological: Negative for dizziness and light-headedness.       Pain well controlled on her current regimen (head pain).  Some balance issues.    Hematological: Negative for adenopathy. Does not bruise/bleed easily.  Psychiatric/Behavioral: Negative for agitation and dysphoric mood.       Objective:    Physical Exam Constitutional:      General: She is not in acute distress.    Appearance: Normal appearance. She is well-developed.  HENT:     Head: Normocephalic and atraumatic.     Right Ear: External ear normal.     Left Ear: External ear normal.  Eyes:     General: No scleral icterus.       Right eye: No discharge.        Left eye: No discharge.     Conjunctiva/sclera: Conjunctivae  normal.  Neck:     Thyroid: No thyromegaly.  Cardiovascular:     Rate and Rhythm: Normal rate and regular rhythm.     Comments: 6-0/6 systolic murmur DP pulses palpable and equal bilaterally.  Pulmonary:     Effort: No tachypnea, accessory muscle usage or respiratory distress.     Breath sounds: Normal breath sounds. No decreased breath sounds or wheezing.  Chest:     Breasts:        Right: No inverted nipple, mass, nipple discharge or tenderness (no axillary adenopathy).  Left: No inverted nipple, mass, nipple discharge or tenderness (no axilarry adenopathy).  Abdominal:     General: Bowel sounds are normal.     Palpations: Abdomen is soft.     Tenderness: There is no abdominal tenderness.  Genitourinary:    Comments: Minimal erythema - left labia. Appears to be c/w yeast.  Vaginal vault without lesions.  Cervix identified.  Pap smear performed.  Could not appreciate any adnexal masses or tenderness.   Musculoskeletal:        General: No swelling or tenderness.     Cervical back: Neck supple. No tenderness.  Lymphadenopathy:     Cervical: No cervical adenopathy.  Skin:    Findings: No erythema or rash.  Neurological:     General: No focal deficit present.     Mental Status: She is alert and oriented to person, place, and time.  Psychiatric:        Mood and Affect: Mood normal.        Behavior: Behavior normal.    Blood pressure rechecked by me:  128/78  BP 134/78   Pulse 63   Temp (!) 97.3 F (36.3 C)   Resp 16   Ht 5' (1.524 m)   Wt 206 lb (93.4 kg)   SpO2 98%   BMI 40.23 kg/m  Wt Readings from Last 3 Encounters:  11/29/19 206 lb (93.4 kg)  11/23/19 206 lb (93.4 kg)  07/25/19 197 lb (89.4 kg)     Lab Results  Component Value Date   WBC 8.2 08/23/2019   HGB 13.9 08/23/2019   HCT 42.0 08/23/2019   PLT 217.0 08/23/2019   GLUCOSE 104 (H) 08/23/2019   CHOL 170 08/23/2019   TRIG 178.0 (H) 08/23/2019   HDL 46.80 08/23/2019   LDLCALC 88 08/23/2019    ALT 29 08/30/2019   AST 16 08/30/2019   NA 140 08/23/2019   K 4.3 08/23/2019   CL 105 08/23/2019   CREATININE 1.10 (H) 11/07/2019   BUN 37 (H) 08/23/2019   CO2 26 08/23/2019   TSH 4.94 (H) 08/30/2019   INR 1.0 05/23/2014   HGBA1C 6.4 08/23/2019   MICROALBUR 35.5 (H) 04/08/2016    MR ABDOMEN MRCP W WO CONTAST  Result Date: 11/07/2019 CLINICAL DATA:  Elevated alkaline phosphatase. Mild biliary ductal dilatation recent ultrasound. Prior cholecystectomy. EXAM: MRI ABDOMEN WITHOUT AND WITH CONTRAST (INCLUDING MRCP) TECHNIQUE: Multiplanar multisequence MR imaging of the abdomen was performed both before and after the administration of intravenous contrast. Heavily T2-weighted images of the biliary and pancreatic ducts were obtained, and three-dimensional MRCP images were rendered by post processing. CONTRAST:  90m GADAVIST GADOBUTROL 1 MMOL/ML IV SOLN COMPARISON:  Abdomen ultrasound on 09/22/2019 FINDINGS: Lower chest: No acute findings. Hepatobiliary: No hepatic masses identified. A few tiny sub-cm cysts are noted in the right lobe. Prior cholecystectomy. Common bile duct measures 8 mm in diameter, which is within normal limits post cholecystectomy. No evidence of choledocholithiasis or biliary stricture. Pancreas:  No mass or inflammatory changes. Spleen:  Within normal limits in size and appearance. Adrenals/Urinary Tract: No masses identified. No evidence of hydronephrosis. Stomach/Bowel: Visualized portion unremarkable. Vascular/Lymphatic: No pathologically enlarged lymph nodes identified. No abdominal aortic aneurysm. Other:  None. Musculoskeletal:  No suspicious bone lesions identified. IMPRESSION: Prior cholecystectomy. No evidence of choledocholithiasis, biliary stricture, or other significant abnormality. Electronically Signed   By: JMarlaine HindM.D.   On: 11/07/2019 17:39       Assessment & Plan:   Problem List Items Addressed  This Visit    Atrial fibrillation with RVR ([place])    Followed  by cardiology.  On amiodarone, atenolol in am.    Stable.  Follow.        Back pain    S/p back surgery.  Has seen Dr Carloyn Manner.  No signficant pain currently.  Follow.        BMI 40.0-44.9, adult (HCC)    Diet and exercise.  Follow.        Elevated alkaline phosphatase level    Saw GI as outlined.  W/up including MRI/MRCP - unrevealing.  Follow.        Relevant Orders   Hepatic function panel   Elevated hemoglobin (HCC)    Follow cbc.       Health care maintenance    Physical today 11/29/19.  PAP 11/29/19.  Mammogram 06/08/19 - Birads I.  Colonoscopy 08/2016.  Recommended f/u in 2022.        Hypercholesterolemia    On lipitor.  Low cholesterol diet and exercise.  Follow lipid panel and liver function tests.        Relevant Orders   Lipid panel   Hyperglycemia    Low carb diet and exercise.  Follow met b and a1c.       Relevant Orders   Hemoglobin A1c   Hypertension    Blood pressure under good control as outlined.  Continue same medication regimen - atenolol, losartan/hctz and amlodipine.  Follow pressures.  Follow metabolic panel.        Relevant Orders   TSH   Basic metabolic panel   Meningioma [facility])    Being followed by neurology.  S/p gamma knife treatment. Pain controlled on current regimen - currently on lamictal, oxycarbazepine and amitriptyline.  Seeing Dr Felecia Shelling.  Recommended f/u in 6 months.        PAD (peripheral artery disease) (HCC)    S/p angioplasty.  On eliquis.  Doing well.  Follow.       Renal insufficiency    Continue to avoid antiinflammatories.  Stay hydrated.  Follow metabolic panel.       Vaginal itching    Erythema - outer vaginal area.  Nystatin cream.  Wet prep obtained.         Other Visit Diagnoses    Acute vaginitis    -  Primary   Relevant Orders   Cervicovaginal ancillary only( [place]) (Completed)   Cervical cancer screening       Relevant Orders   Cytology - PAP( [place]) (Completed)       [name], MD

## 2019-11-29 NOTE — Assessment & Plan Note (Signed)
Physical today 11/29/19.  PAP 11/29/19.  Mammogram 06/08/19 - Birads I.  Colonoscopy 08/2016.  Recommended f/u in 2022.

## 2019-11-30 LAB — CERVICOVAGINAL ANCILLARY ONLY
Bacterial Vaginitis (gardnerella): NEGATIVE
Candida Glabrata: NEGATIVE
Candida Vaginitis: NEGATIVE
Comment: NEGATIVE
Comment: NEGATIVE
Comment: NEGATIVE

## 2019-11-30 LAB — CYTOLOGY - PAP
Comment: NEGATIVE
Diagnosis: NEGATIVE
High risk HPV: NEGATIVE

## 2019-12-03 ENCOUNTER — Ambulatory Visit: Payer: 59 | Attending: Internal Medicine

## 2019-12-03 ENCOUNTER — Other Ambulatory Visit: Payer: Self-pay

## 2019-12-03 DIAGNOSIS — Z23 Encounter for immunization: Secondary | ICD-10-CM

## 2019-12-03 NOTE — Progress Notes (Signed)
   Covid-19 Vaccination Clinic  Name:  Kristin James    MRN: [id number] DOB: May 20, 1956  12/03/2019  Kristin James was observed post Covid-19 immunization for 15 minutes without incident. She was provided with Vaccine Information Sheet and instruction to access the V-Safe system.   Kristin James was instructed to call 911 with any severe reactions post vaccine: Marland Kitchen Difficulty breathing  . Swelling of face and throat  . A fast heartbeat  . A bad rash all over body  . Dizziness and weakness   Immunizations Administered    Name Date Dose VIS Date Route   Moderna COVID-19 Vaccine 12/03/2019  8:34 AM 0.5 mL 08/02/2019 Intramuscular   Manufacturer: Moderna   Lot: [id number]   [id number]

## 2019-12-10 ENCOUNTER — Encounter: Payer: Self-pay | Admitting: Internal Medicine

## 2019-12-10 DIAGNOSIS — N898 Other specified noninflammatory disorders of vagina: Secondary | ICD-10-CM | POA: Insufficient documentation

## 2019-12-10 NOTE — Assessment & Plan Note (Signed)
Continue to avoid antiinflammatories.  Stay hydrated.  Follow metabolic panel.  

## 2019-12-10 NOTE — Assessment & Plan Note (Signed)
S/p angioplasty.  On eliquis.  Doing well.  Follow.

## 2019-12-10 NOTE — Assessment & Plan Note (Signed)
Blood pressure under good control as outlined.  Continue same medication regimen - atenolol, losartan/hctz and amlodipine.  Follow pressures.  Follow metabolic panel.

## 2019-12-10 NOTE — Assessment & Plan Note (Signed)
Erythema - outer vaginal area.  Nystatin cream.  Wet prep obtained.

## 2019-12-10 NOTE — Assessment & Plan Note (Signed)
Follow cbc.  

## 2019-12-10 NOTE — Assessment & Plan Note (Signed)
Low carb diet and exercise.  Follow met b and a1c.  

## 2019-12-10 NOTE — Assessment & Plan Note (Signed)
Diet and exercise.  Follow.  

## 2019-12-10 NOTE — Assessment & Plan Note (Addendum)
Followed by cardiology.  On amiodarone, atenolol in am.    Stable.  Follow.

## 2019-12-10 NOTE — Assessment & Plan Note (Signed)
Saw GI as outlined.  W/up including MRI/MRCP - unrevealing.  Follow.

## 2019-12-10 NOTE — Assessment & Plan Note (Signed)
Being followed by neurology.  S/p gamma knife treatment. Pain controlled on current regimen - currently on lamictal, oxycarbazepine and amitriptyline.  Seeing Dr Felecia Shelling.  Recommended f/u in 6 months.

## 2019-12-10 NOTE — Assessment & Plan Note (Signed)
S/p back surgery.  Has seen Dr Carloyn Manner.  No signficant pain currently.  Follow.

## 2019-12-10 NOTE — Assessment & Plan Note (Signed)
On lipitor.  Low cholesterol diet and exercise.  Follow lipid panel and liver function tests.   

## 2019-12-11 ENCOUNTER — Other Ambulatory Visit: Payer: Self-pay | Admitting: Cardiovascular Disease

## 2019-12-12 NOTE — Telephone Encounter (Signed)
Pt's age 64, wt 93.4 kg, SCr 1.10, CrCl 76.18, last ov w/ TG 05/31/19.

## 2019-12-12 NOTE — Telephone Encounter (Signed)
Please review for refill. Thanks!  

## 2019-12-27 ENCOUNTER — Other Ambulatory Visit: Payer: Self-pay

## 2019-12-27 ENCOUNTER — Other Ambulatory Visit (INDEPENDENT_AMBULATORY_CARE_PROVIDER_SITE_OTHER): Payer: 59

## 2019-12-27 DIAGNOSIS — E7800 Pure hypercholesterolemia, unspecified: Secondary | ICD-10-CM

## 2019-12-27 DIAGNOSIS — I10 Essential (primary) hypertension: Secondary | ICD-10-CM | POA: Diagnosis not present

## 2019-12-27 DIAGNOSIS — R739 Hyperglycemia, unspecified: Secondary | ICD-10-CM

## 2019-12-27 DIAGNOSIS — R748 Abnormal levels of other serum enzymes: Secondary | ICD-10-CM

## 2019-12-27 LAB — HEMOGLOBIN A1C: Hgb A1c MFr Bld: 6.2 % (ref 4.6–6.5)

## 2019-12-27 LAB — LIPID PANEL
Cholesterol: 178 mg/dL (ref 0–200)
HDL: 47.4 mg/dL (ref >39.00)
LDL Cholesterol: 116 mg/dL — ABNORMAL HIGH (ref 0–99)
NonHDL: 130.41
Total CHOL/HDL Ratio: 4
Triglycerides: 72 mg/dL (ref 0.0–149.0)
VLDL: 14.4 mg/dL (ref 0.0–40.0)

## 2019-12-27 LAB — HEPATIC FUNCTION PANEL
ALT: 25 U/L (ref 0–35)
AST: 22 U/L (ref 0–37)
Albumin: 4.2 g/dL (ref 3.5–5.2)
Alkaline Phosphatase: 130 U/L — ABNORMAL HIGH (ref 39–117)
Bilirubin, Direct: 0.1 mg/dL (ref 0.0–0.3)
Total Bilirubin: 0.7 mg/dL (ref 0.2–1.2)
Total Protein: 6.9 g/dL (ref 6.0–8.3)

## 2019-12-27 LAB — BASIC METABOLIC PANEL
BUN: 30 mg/dL — ABNORMAL HIGH (ref 6–23)
CO2: 26 mEq/L (ref 19–32)
Calcium: 9.1 mg/dL (ref 8.4–10.5)
Chloride: 104 mEq/L (ref 96–112)
Creatinine, Ser: 1.23 mg/dL — ABNORMAL HIGH (ref 0.40–1.20)
GFR: 43.89 mL/min — ABNORMAL LOW (ref >60.00)
Glucose, Bld: 126 mg/dL — ABNORMAL HIGH (ref 70–99)
Potassium: 4 mEq/L (ref 3.5–5.1)
Sodium: 139 mEq/L (ref 135–145)

## 2019-12-27 LAB — TSH: TSH: 4.91 u[IU]/mL — ABNORMAL HIGH (ref 0.35–4.50)

## 2020-01-19 ENCOUNTER — Other Ambulatory Visit: Payer: Self-pay | Admitting: Cardiovascular Disease

## 2020-02-07 ENCOUNTER — Other Ambulatory Visit: Payer: Self-pay | Admitting: Neurology

## 2020-03-10 ENCOUNTER — Other Ambulatory Visit: Payer: Self-pay | Admitting: Cardiovascular Disease

## 2020-03-12 NOTE — Telephone Encounter (Signed)
Pt's age 64, wt 93.4 kg, SCr 1.23, CrCl 68.13, last ov w/ TG 05/31/19.

## 2020-03-12 NOTE — Telephone Encounter (Signed)
Please review for refill. Thanks!  

## 2020-03-16 ENCOUNTER — Other Ambulatory Visit: Payer: Self-pay | Admitting: Internal Medicine

## 2020-03-19 ENCOUNTER — Other Ambulatory Visit: Payer: Self-pay | Admitting: Internal Medicine

## 2020-03-27 ENCOUNTER — Other Ambulatory Visit: Payer: Self-pay

## 2020-03-30 ENCOUNTER — Other Ambulatory Visit: Payer: Self-pay

## 2020-03-30 ENCOUNTER — Other Ambulatory Visit: Payer: Self-pay | Admitting: Internal Medicine

## 2020-03-30 ENCOUNTER — Ambulatory Visit (INDEPENDENT_AMBULATORY_CARE_PROVIDER_SITE_OTHER): Payer: 59 | Admitting: Internal Medicine

## 2020-03-30 VITALS — BP 128/72 | HR 68 | Temp 98.1°F | Resp 16 | Ht 60.0 in | Wt 207.0 lb

## 2020-03-30 DIAGNOSIS — Z1231 Encounter for screening mammogram for malignant neoplasm of breast: Secondary | ICD-10-CM | POA: Diagnosis not present

## 2020-03-30 DIAGNOSIS — M2550 Pain in unspecified joint: Secondary | ICD-10-CM

## 2020-03-30 DIAGNOSIS — I10 Essential (primary) hypertension: Secondary | ICD-10-CM | POA: Diagnosis not present

## 2020-03-30 DIAGNOSIS — N183 Chronic kidney disease, stage 3 unspecified: Secondary | ICD-10-CM | POA: Diagnosis not present

## 2020-03-30 DIAGNOSIS — D582 Other hemoglobinopathies: Secondary | ICD-10-CM

## 2020-03-30 DIAGNOSIS — E7800 Pure hypercholesterolemia, unspecified: Secondary | ICD-10-CM

## 2020-03-30 DIAGNOSIS — I4891 Unspecified atrial fibrillation: Secondary | ICD-10-CM

## 2020-03-30 DIAGNOSIS — E875 Hyperkalemia: Secondary | ICD-10-CM

## 2020-03-30 DIAGNOSIS — R739 Hyperglycemia, unspecified: Secondary | ICD-10-CM

## 2020-03-30 DIAGNOSIS — I739 Peripheral vascular disease, unspecified: Secondary | ICD-10-CM

## 2020-03-30 DIAGNOSIS — M4800 Spinal stenosis, site unspecified: Secondary | ICD-10-CM

## 2020-03-30 DIAGNOSIS — D329 Benign neoplasm of meninges, unspecified: Secondary | ICD-10-CM

## 2020-03-30 LAB — LIPID PANEL
Cholesterol: 168 mg/dL (ref 0–200)
HDL: 49.5 mg/dL (ref >39.00)
LDL Cholesterol: 97 mg/dL (ref 0–99)
NonHDL: 118.69
Total CHOL/HDL Ratio: 3
Triglycerides: 110 mg/dL (ref 0.0–149.0)
VLDL: 22 mg/dL (ref 0.0–40.0)

## 2020-03-30 LAB — BASIC METABOLIC PANEL
BUN: 44 mg/dL — ABNORMAL HIGH (ref 6–23)
CO2: 27 mEq/L (ref 19–32)
Calcium: 9.6 mg/dL (ref 8.4–10.5)
Chloride: 105 mEq/L (ref 96–112)
Creatinine, Ser: 1.58 mg/dL — ABNORMAL HIGH (ref 0.40–1.20)
GFR: 32.85 mL/min — ABNORMAL LOW (ref >60.00)
Glucose, Bld: 137 mg/dL — ABNORMAL HIGH (ref 70–99)
Potassium: 5.2 mEq/L — ABNORMAL HIGH (ref 3.5–5.1)
Sodium: 140 mEq/L (ref 135–145)

## 2020-03-30 LAB — HEPATIC FUNCTION PANEL
ALT: 22 U/L (ref 0–35)
AST: 23 U/L (ref 0–37)
Albumin: 4.4 g/dL (ref 3.5–5.2)
Alkaline Phosphatase: 122 U/L — ABNORMAL HIGH (ref 39–117)
Bilirubin, Direct: 0.1 mg/dL (ref 0.0–0.3)
Total Bilirubin: 0.5 mg/dL (ref 0.2–1.2)
Total Protein: 6.7 g/dL (ref 6.0–8.3)

## 2020-03-30 LAB — HEMOGLOBIN A1C: Hgb A1c MFr Bld: 6.1 % (ref 4.6–6.5)

## 2020-03-30 LAB — TSH: TSH: 3.52 u[IU]/mL (ref 0.35–4.50)

## 2020-03-30 NOTE — Progress Notes (Signed)
Order placed for f/u stat met b.   

## 2020-03-30 NOTE — Progress Notes (Signed)
Patient ID: Kristin James, female   DOB: 07-27-1956, 64 y.o.   MRN: [id number]   Subjective:    Patient ID: Kristin James, female    DOB: [birth year], 64 y.o.   MRN: [id number]  HPI This visit occurred during the SARS-CoV-2 public health emergency.  Safety protocols were in place, including screening questions prior to the visit, additional usage of staff PPE, and extensive cleaning of exam room while observing appropriate contact time as indicated for disinfecting solutions.  Patient here for a scheduled follow up. She reports things are relatively stable.  No head pain now.  Sent her MRIs to [facility].  Reviewed. States would not do surgery unless pain returned.  She is still unsteady.  Using a cane.  No chest pain or sob.  No acid reflux reported.  No abdominal pain or bowel change reported.  Taking her medication.  She is having increased pain in her joints.  This has been a persistent issue.  Also increased pain in her back.  Was previously taking meloxicam and this helped significantly.  Request to have some to take prn.  Discussed risk of worsening kidney function, elevation of blood pressure, bleeding risk, GI irritation, etc.  She understands and states it is becoming a quality of life issue.  Feels needs to function.  Blood pressures averaging 151/76 - <160 systolic.  She has taken some of her husband's meloxicam for the last three days.    Past Medical History:  Diagnosis Date  . Embolus to lower extremity ([place])   . Hiatal hernia   . Hypercholesteremia   . Hyperglycemia   . Hypertension   . Osteoarthritis   . Peripheral arterial disease (HCC)    embolic disease   . Proteinuria   . Spinal stenosis at L4-L5 level   . Thrombophlebitis of posterior tibial vein ([place]) 06/01/2014  . Trigeminal neuralgia 08/2016   Past Surgical History:  Procedure Laterality Date  . AORTOGRAM    . BACK SURGERY  11/13/2014  . CHOLECYSTECTOMY    . COLONOSCOPY WITH PROPOFOL N/A 08/15/2016    Procedure: COLONOSCOPY WITH PROPOFOL;  Surgeon: Lollie Sails, MD;  Location: [facility] ENDOSCOPY;  Service: Endoscopy;  Laterality: N/A;  . KNEE ARTHROSCOPY    . LOWER EXTREMITY ANGIOGRAM     Family History  Problem Relation Age of Onset  . Hypertension Mother   . Stroke Father        3 strokes  . Hypertension Father   . Breast cancer Paternal Grandmother   . Colon cancer Neg Hx    Social History   Socioeconomic History  . Marital status: Married    Spouse name: Not on file  . Number of children: 2  . Years of education: Not on file  . Highest education level: Not on file  Occupational History  . Not on file  Tobacco Use  . Smoking status: Never Smoker  . Smokeless tobacco: Never Used  Substance and Sexual Activity  . Alcohol use: Yes    Alcohol/week: 0.0 standard drinks    Comment: occ. wine  . Drug use: No  . Sexual activity: Not on file  Other Topics Concern  . Not on file  Social History Narrative   Right handed    Caffeine use: coffee (1/2 cup per day)   Social Determinants of Health   Financial Resource Strain:   . Difficulty of Paying Living Expenses:   Food Insecurity:   . Worried About Charity fundraiser in  the Last Year:   . [place] in the Last Year:   Transportation Needs:   . Film/video editor (Medical):   Marland Kitchen Lack of Transportation (Non-Medical):   Physical Activity:   . Days of Exercise per Week:   . Minutes of Exercise per Session:   Stress:   . Feeling of Stress :   Social Connections:   . Frequency of Communication with Friends and Family:   . Frequency of Social Gatherings with Friends and Family:   . Attends Religious Services:   . Active Member of Clubs or Organizations:   . Attends Archivist Meetings:   Marland Kitchen Marital Status:     Outpatient Encounter Medications as of 03/30/2020  Medication Sig  . amiodarone (PACERONE) 200 MG tablet TAKE 1 TABLET BY MOUTH ONCE DAILY  . amitriptyline (ELAVIL) 25 MG tablet TAKE 1  TABLET(25 MG) BY MOUTH AT BEDTIME  . amLODipine (NORVASC) 5 MG tablet TAKE 1 TABLET(5 MG) BY MOUTH TWICE DAILY  . atenolol (TENORMIN) 25 MG tablet TAKE 1 TABLET(25 MG) BY MOUTH DAILY  . atorvastatin (LIPITOR) 40 MG tablet TAKE 1 TABLET BY MOUTH ONCE DAILY  . ELIQUIS 5 MG TABS tablet TAKE 1 TABLET BY MOUTH TWICE DAILY  . lamoTRIgine (LAMICTAL) 100 MG tablet Take 1 tablet (100 mg total) by mouth 2 (two) times daily.  Marland Kitchen losartan-hydrochlorothiazide (HYZAAR) 100-12.5 MG tablet TAKE 1 TABLET BY MOUTH DAILY  . nystatin cream (MYCOSTATIN) Apply 1 application topically 2 (two) times daily.  . Oxcarbazepine (TRILEPTAL) 300 MG tablet Take 1 tablet (300 mg total) by mouth 2 (two) times daily.   No facility-administered encounter medications on file as of 03/30/2020.    Review of Systems  Constitutional: Negative for appetite change and unexpected weight change.  HENT: Negative for congestion and sinus pressure.   Respiratory: Negative for cough, chest tightness and shortness of breath.   Cardiovascular: Negative for chest pain, palpitations and leg swelling.  Gastrointestinal: Negative for abdominal pain, diarrhea, nausea and vomiting.  Genitourinary: Negative for difficulty urinating and dysuria.  Musculoskeletal:       Back pain.  Joint pain as outlined.    Skin: Negative for color change and rash.  Neurological: Negative for dizziness, light-headedness and headaches.  Psychiatric/Behavioral: Negative for agitation and dysphoric mood.       Objective:    Physical Exam Vitals reviewed.  Constitutional:      General: She is not in acute distress.    Appearance: Normal appearance.  HENT:     Head: Normocephalic and atraumatic.     Right Ear: External ear normal.     Left Ear: External ear normal.  Neck:     Thyroid: No thyromegaly.  Cardiovascular:     Rate and Rhythm: Normal rate and regular rhythm.  Pulmonary:     Effort: No respiratory distress.     Breath sounds: Normal breath  sounds. No wheezing.  Abdominal:     General: Bowel sounds are normal.     Palpations: Abdomen is soft.     Tenderness: There is no abdominal tenderness.  Musculoskeletal:        General: No swelling or tenderness.     Cervical back: Neck supple.  Lymphadenopathy:     Cervical: No cervical adenopathy.  Skin:    Findings: No erythema or rash.  Neurological:     Mental Status: She is alert.  Psychiatric:        Mood and Affect: Mood normal.  Behavior: Behavior normal.     BP 128/72   Pulse 68   Temp 98.1 F (36.7 C)   Resp 16   Ht 5' (1.524 m)   Wt (!) 207 lb (93.9 kg)   SpO2 97%   BMI 40.43 kg/m  Wt Readings from Last 3 Encounters:  03/30/20 (!) 207 lb (93.9 kg)  11/29/19 206 lb (93.4 kg)  11/23/19 206 lb (93.4 kg)     Lab Results  Component Value Date   WBC 8.2 08/23/2019   HGB 13.9 08/23/2019   HCT 42.0 08/23/2019   PLT 217.0 08/23/2019   GLUCOSE 137 (H) 03/30/2020   CHOL 168 03/30/2020   TRIG 110.0 03/30/2020   HDL 49.50 03/30/2020   LDLCALC 97 03/30/2020   ALT 22 03/30/2020   AST 23 03/30/2020   NA 140 03/30/2020   K 5.2 No hemolysis seen (H) 03/30/2020   CL 105 03/30/2020   CREATININE 1.58 (H) 03/30/2020   BUN 44 (H) 03/30/2020   CO2 27 03/30/2020   TSH 3.52 03/30/2020   INR 1.0 05/23/2014   HGBA1C 6.1 03/30/2020   MICROALBUR 35.5 (H) 04/08/2016       Assessment & Plan:   Problem List Items Addressed This Visit    Atrial fibrillation with RVR ([place])    Followed by cardiology.  On amiodarone, atenolol.  Stable.  Follow.       CKD (chronic kidney disease) stage 3, GFR 30-59 ml/min    Discussed kidney function and desire to try to avoid antiinflammatories.  She has increased joint pain.  Limits her activity.  Affecting her quality of life.  Discussed if kidney function stable or improved, could try meloxicam sparingly.  Unable to take ultram.  Tylenol not helping.  Has tried gabapentin.        Elevated hemoglobin (HCC)    Follow cbc.        Hypercholesterolemia    On lipitor.  Low cholesterol diet and exercise.  Follow lipid panel and liver function tests.        Relevant Orders   Hepatic function panel (Completed)   Lipid panel (Completed)   Hyperglycemia    Low carb diet and exercise.  Follow met b and a1c.       Relevant Orders   Hemoglobin A1c (Completed)   Hypertension    Blood pressure doing well on losartan/hctz, tenormin and amlodipine.  Recheck blood pressure wnl.  Follow pressures.  Follow metabolic panel.       Relevant Orders   TSH (Completed)   Basic metabolic panel (Completed)   Joint pain    Joint pain as outlined.  Increased pain.  Feels needs meloxicam.  Discussed renal function and concern regarding taking antiinflammatories.  Will recheck renal function.  If stable or improved, can try using meloxicam sparingly.        Meningioma [facility])    Being followed by neurology.  S/p gamma knife treatment.  No pain now.  Seeing Dr Felecia Shelling.  Currently on lamictal, oxycarbazepine and amitriptyline.  Follow.       PAD (peripheral artery disease) (HCC)    S/p angioplasty.  On eliquis.  Continue risk factor modification.       Spinal stenosis    Was followed by Dr Carloyn Manner.  S/p surgery.         Other Visit Diagnoses    Visit for screening mammogram    -  Primary   Relevant Orders   MM 3D SCREEN BREAST BILATERAL  Jaala Bohle, MD 

## 2020-03-31 ENCOUNTER — Encounter: Payer: Self-pay | Admitting: Internal Medicine

## 2020-03-31 DIAGNOSIS — M2550 Pain in unspecified joint: Secondary | ICD-10-CM | POA: Insufficient documentation

## 2020-03-31 NOTE — Assessment & Plan Note (Signed)
Blood pressure doing well on losartan/hctz, tenormin and amlodipine.  Recheck blood pressure wnl.  Follow pressures.  Follow metabolic panel.

## 2020-03-31 NOTE — Assessment & Plan Note (Signed)
Being followed by neurology.  S/p gamma knife treatment.  No pain now.  Seeing Dr Felecia Shelling.  Currently on lamictal, oxycarbazepine and amitriptyline.  Follow.

## 2020-03-31 NOTE — Assessment & Plan Note (Signed)
Discussed kidney function and desire to try to avoid antiinflammatories.  She has increased joint pain.  Limits her activity.  Affecting her quality of life.  Discussed if kidney function stable or improved, could try meloxicam sparingly.  Unable to take ultram.  Tylenol not helping.  Has tried gabapentin.

## 2020-03-31 NOTE — Assessment & Plan Note (Signed)
Follow cbc.  

## 2020-03-31 NOTE — Assessment & Plan Note (Signed)
Low carb diet and exercise.  Follow met b and a1c.  

## 2020-03-31 NOTE — Assessment & Plan Note (Signed)
Followed by cardiology.  On amiodarone, atenolol.  Stable.  Follow.

## 2020-03-31 NOTE — Assessment & Plan Note (Signed)
Was followed by Dr Carloyn Manner.  S/p surgery.

## 2020-03-31 NOTE — Assessment & Plan Note (Signed)
Joint pain as outlined.  Increased pain.  Feels needs meloxicam.  Discussed renal function and concern regarding taking antiinflammatories.  Will recheck renal function.  If stable or improved, can try using meloxicam sparingly.

## 2020-03-31 NOTE — Assessment & Plan Note (Signed)
On lipitor.  Low cholesterol diet and exercise.  Follow lipid panel and liver function tests.   

## 2020-03-31 NOTE — Assessment & Plan Note (Signed)
S/p angioplasty.  On eliquis.  Continue risk factor modification.

## 2020-04-02 ENCOUNTER — Other Ambulatory Visit: Payer: Self-pay

## 2020-04-02 ENCOUNTER — Other Ambulatory Visit
Admission: RE | Admit: 2020-04-02 | Discharge: 2020-04-02 | Disposition: A | Discharge status: Home / Self Care | Payer: 59 | Attending: Internal Medicine | Admitting: Internal Medicine

## 2020-04-02 DIAGNOSIS — E875 Hyperkalemia: Secondary | ICD-10-CM | POA: Diagnosis not present

## 2020-04-02 LAB — BASIC METABOLIC PANEL
Anion gap: 9 (ref 5–15)
BUN: 35 mg/dL — ABNORMAL HIGH (ref 8–23)
CO2: 27 mmol/L (ref 22–32)
Calcium: 9 mg/dL (ref 8.9–10.3)
Chloride: 101 mmol/L (ref 98–111)
Creatinine, Ser: 1.33 mg/dL — ABNORMAL HIGH (ref 0.44–1.00)
GFR calc Af Amer: 49 mL/min — ABNORMAL LOW (ref >60)
GFR calc non Af Amer: 42 mL/min — ABNORMAL LOW (ref >60)
Glucose, Bld: 138 mg/dL — ABNORMAL HIGH (ref 70–99)
Potassium: 4.5 mmol/L (ref 3.5–5.1)
Sodium: 137 mmol/L (ref 135–145)

## 2020-04-20 ENCOUNTER — Other Ambulatory Visit: Payer: Self-pay | Admitting: Neurology

## 2020-05-14 IMAGING — US US ABDOMEN COMPLETE
1 series · 13 of 25 positions shown · non-contrast
Comparison: KUB 02/17/2006.  Ultrasound report 12/28/1995.

CLINICAL DATA: Elevated alkaline phosphatase.

EXAM:
ABDOMEN ULTRASOUND COMPLETE

[Series 1: us abdomen complete · 13 of 48 slices shown]
[im 1/48]
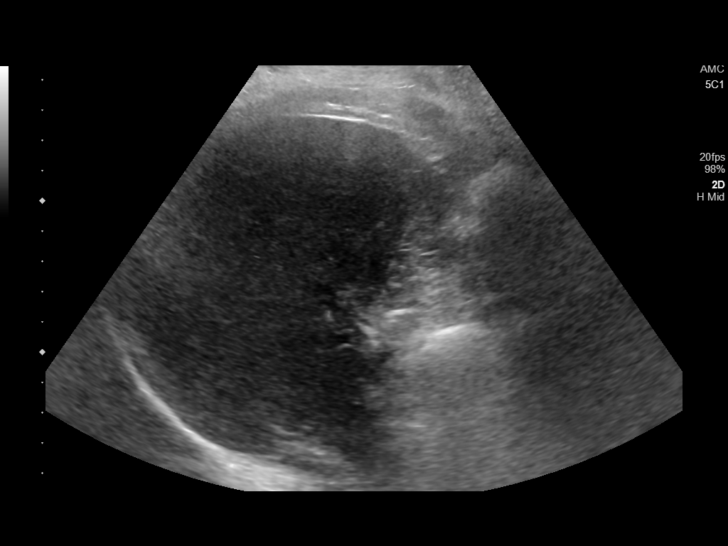
[im 4/48]
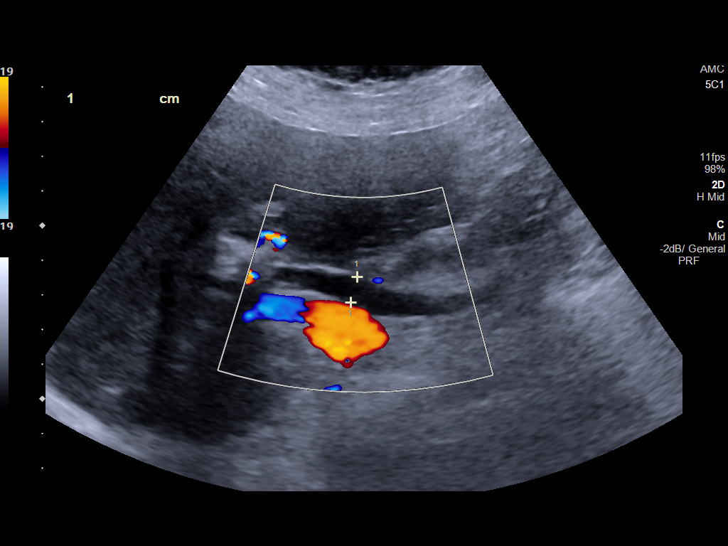
[im 8/48]
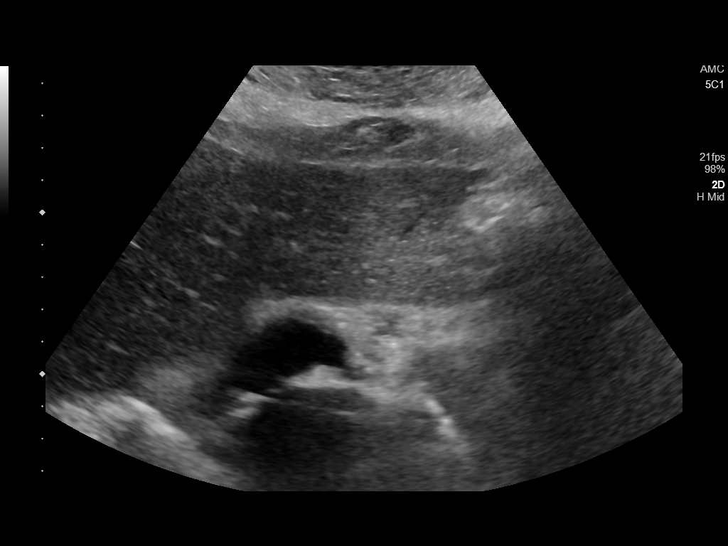
[im 12/48]
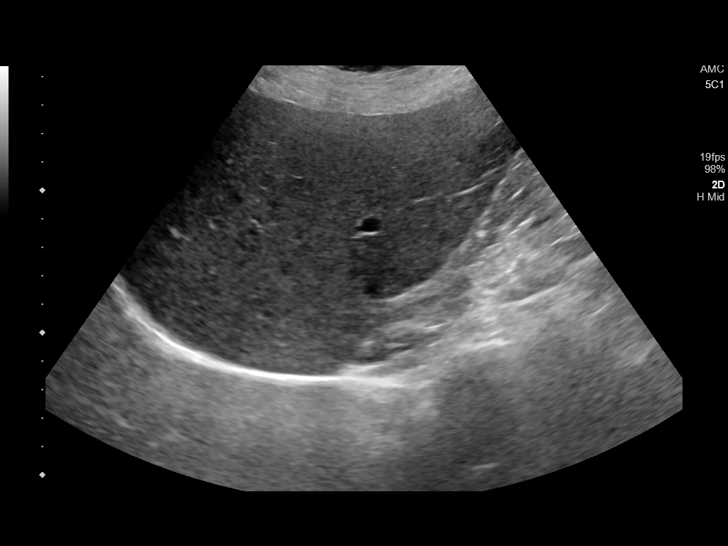
[im 16/48]
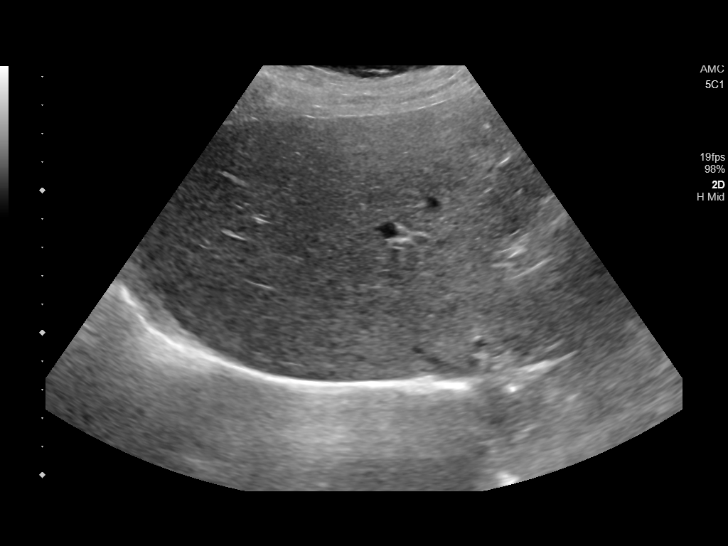
[im 20/48]
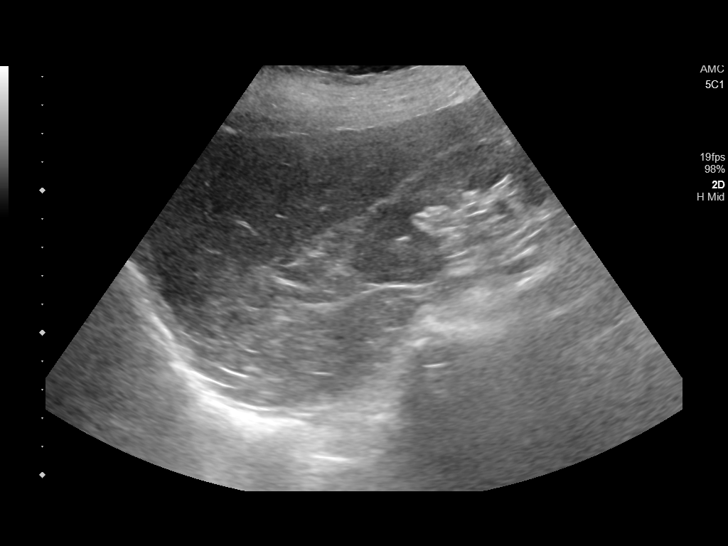
[im 24/48]
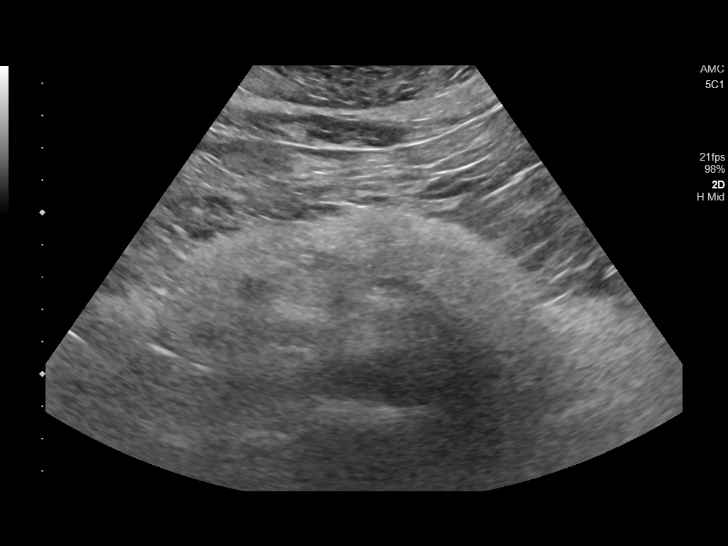
[im 28/48]
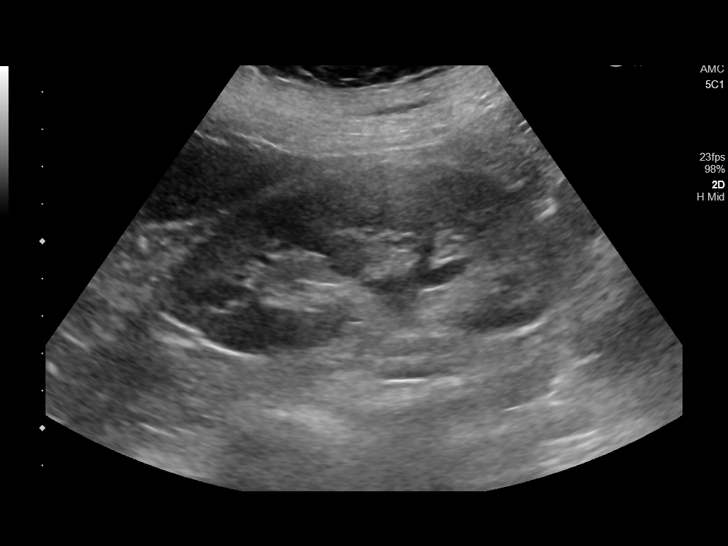
[im 32/48]
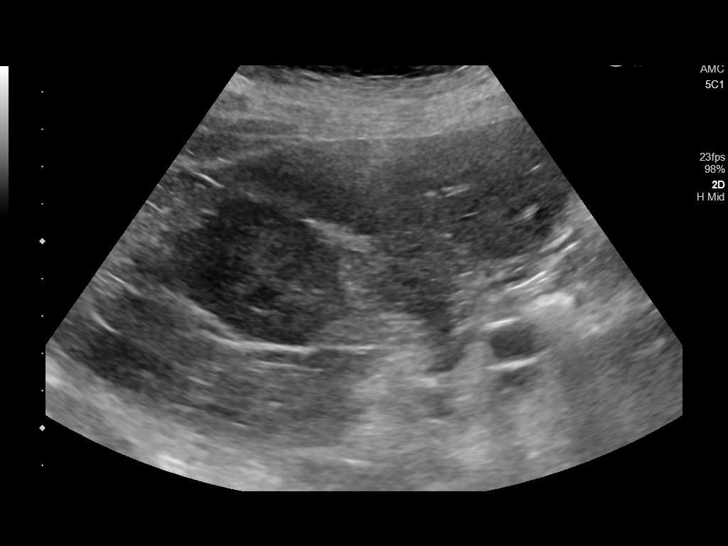
[im 36/48]
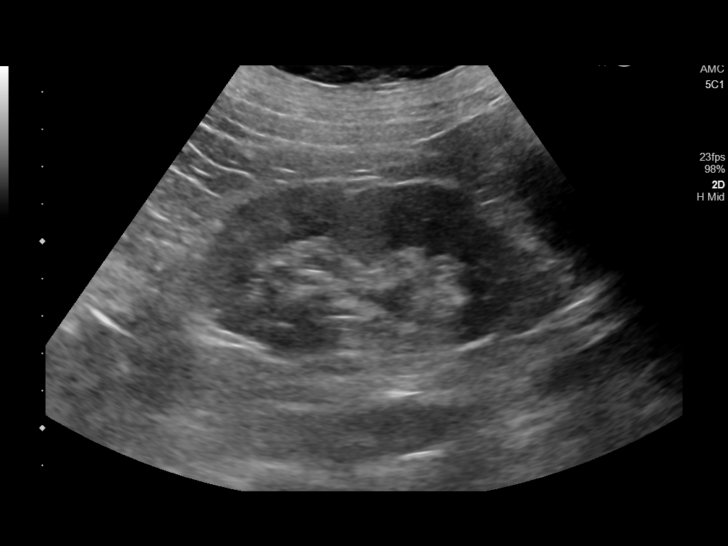
[im 40/48]
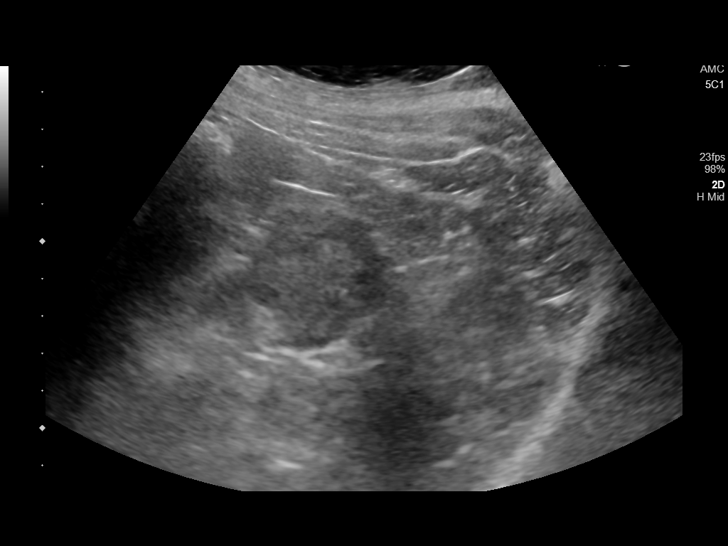
[im 44/48]
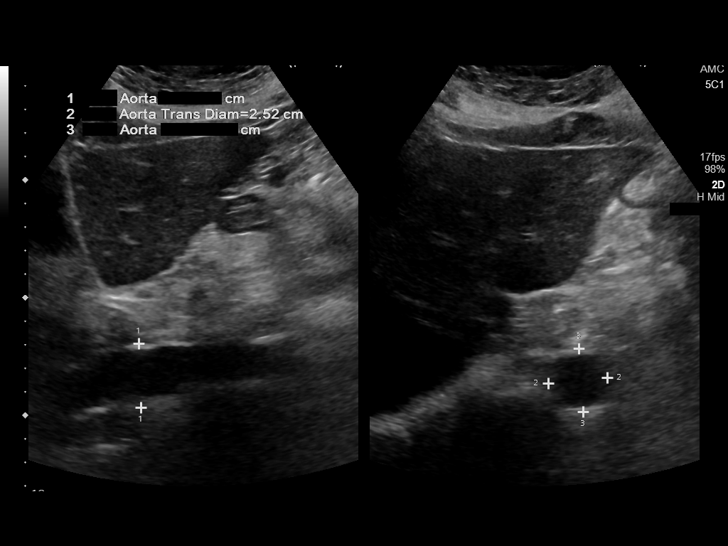
[im 48/48]
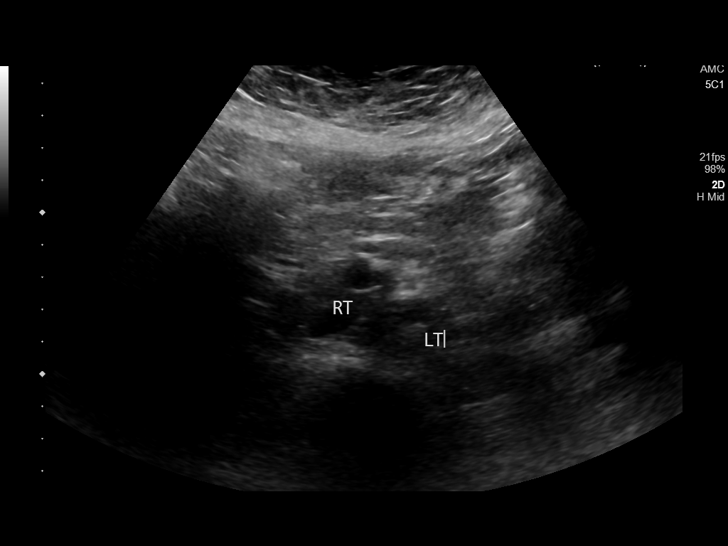

[13 of 25 positions shown; findings below may reference images not displayed]

FINDINGS: Gallbladder: Cholecystectomy.

Common bile duct: Diameter: 7.6 mm

Liver: No focal lesion identified. Within normal limits in
parenchymal echogenicity. Portal vein is patent on color Doppler
imaging with normal direction of blood flow towards the liver.

IVC: No abnormality visualized.

Pancreas: Visualized portion unremarkable.

Spleen: Size and appearance within normal limits.

Right Kidney: Length: 10.7 cm. Echogenicity within normal limits. No
mass or hydronephrosis visualized.

Left Kidney: Length: 8.9 cm. Slight contour irregularity suggesting
scarring. Echogenicity within normal limits. No mass or
hydronephrosis visualized.

Abdominal aorta: No aneurysm visualized.

Other findings: None.
IMPRESSION: 1. Prior cholecystectomy. Common bile duct is mildly prominent at
7.6 mm. This could be seen following cholecystectomy. Given
patient's elevated alkaline phosphatase MRI of the abdomen with MRCP
may prove useful for further evaluation.

2. Left kidney is smaller than right. Left kidney has a slight
contour irregularity. These findings suggest left renal scarring.

## 2020-05-31 ENCOUNTER — Ambulatory Visit: Payer: 59 | Admitting: Nurse Practitioner

## 2020-06-03 ENCOUNTER — Other Ambulatory Visit: Payer: Self-pay

## 2020-06-03 ENCOUNTER — Emergency Department: Payer: 59

## 2020-06-03 ENCOUNTER — Encounter: Payer: Self-pay | Admitting: Emergency Medicine

## 2020-06-03 ENCOUNTER — Emergency Department
Admission: EM | Admit: 2020-06-03 | Discharge: 2020-06-03 | Disposition: A | Discharge status: Home / Self Care | Payer: 59 | Attending: Emergency Medicine | Admitting: Emergency Medicine

## 2020-06-03 DIAGNOSIS — Z7901 Long term (current) use of anticoagulants: Secondary | ICD-10-CM | POA: Diagnosis not present

## 2020-06-03 DIAGNOSIS — Z20822 Contact with and (suspected) exposure to covid-19: Secondary | ICD-10-CM | POA: Insufficient documentation

## 2020-06-03 DIAGNOSIS — I129 Hypertensive chronic kidney disease with stage 1 through stage 4 chronic kidney disease, or unspecified chronic kidney disease: Secondary | ICD-10-CM | POA: Insufficient documentation

## 2020-06-03 DIAGNOSIS — Z79899 Other long term (current) drug therapy: Secondary | ICD-10-CM | POA: Diagnosis not present

## 2020-06-03 DIAGNOSIS — M778 Other enthesopathies, not elsewhere classified: Secondary | ICD-10-CM | POA: Diagnosis not present

## 2020-06-03 DIAGNOSIS — N183 Chronic kidney disease, stage 3 unspecified: Secondary | ICD-10-CM | POA: Diagnosis not present

## 2020-06-03 DIAGNOSIS — L03113 Cellulitis of right upper limb: Secondary | ICD-10-CM

## 2020-06-03 DIAGNOSIS — M7989 Other specified soft tissue disorders: Secondary | ICD-10-CM | POA: Diagnosis not present

## 2020-06-03 DIAGNOSIS — M25521 Pain in right elbow: Secondary | ICD-10-CM | POA: Diagnosis not present

## 2020-06-03 LAB — BASIC METABOLIC PANEL
Anion gap: 10 (ref 5–15)
BUN: 31 mg/dL — ABNORMAL HIGH (ref 8–23)
CO2: 24 mmol/L (ref 22–32)
Calcium: 8 mg/dL — ABNORMAL LOW (ref 8.9–10.3)
Chloride: 105 mmol/L (ref 98–111)
Creatinine, Ser: 1.5 mg/dL — ABNORMAL HIGH (ref 0.44–1.00)
GFR calc Af Amer: 42 mL/min — ABNORMAL LOW (ref >60)
GFR calc non Af Amer: 36 mL/min — ABNORMAL LOW (ref >60)
Glucose, Bld: 117 mg/dL — ABNORMAL HIGH (ref 70–99)
Potassium: 3.6 mmol/L (ref 3.5–5.1)
Sodium: 139 mmol/L (ref 135–145)

## 2020-06-03 LAB — COMPREHENSIVE METABOLIC PANEL
ALT: 26 U/L (ref 0–44)
AST: 26 U/L (ref 15–41)
Albumin: 3.7 g/dL (ref 3.5–5.0)
Alkaline Phosphatase: 117 U/L (ref 38–126)
Anion gap: 13 (ref 5–15)
BUN: 39 mg/dL — ABNORMAL HIGH (ref 8–23)
CO2: 25 mmol/L (ref 22–32)
Calcium: 8.9 mg/dL (ref 8.9–10.3)
Chloride: 99 mmol/L (ref 98–111)
Creatinine, Ser: 1.8 mg/dL — ABNORMAL HIGH (ref 0.44–1.00)
GFR calc Af Amer: 34 mL/min — ABNORMAL LOW (ref >60)
GFR calc non Af Amer: 29 mL/min — ABNORMAL LOW (ref >60)
Glucose, Bld: 115 mg/dL — ABNORMAL HIGH (ref 70–99)
Potassium: 4 mmol/L (ref 3.5–5.1)
Sodium: 137 mmol/L (ref 135–145)
Total Bilirubin: 1.1 mg/dL (ref 0.3–1.2)
Total Protein: 7.7 g/dL (ref 6.5–8.1)

## 2020-06-03 LAB — CBC WITH DIFFERENTIAL/PLATELET
Abs Immature Granulocytes: 0.13 10*3/uL — ABNORMAL HIGH (ref 0.00–0.07)
Basophils Absolute: 0 10*3/uL (ref 0.0–0.1)
Basophils Relative: 0 %
Eosinophils Absolute: 0 10*3/uL (ref 0.0–0.5)
Eosinophils Relative: 0 %
HCT: 38.8 % (ref 36.0–46.0)
Hemoglobin: 13.7 g/dL (ref 12.0–15.0)
Immature Granulocytes: 1 %
Lymphocytes Relative: 8 %
Lymphs Abs: 1.4 10*3/uL (ref 0.7–4.0)
MCH: 32 pg (ref 26.0–34.0)
MCHC: 35.3 g/dL (ref 30.0–36.0)
MCV: 90.7 fL (ref 80.0–100.0)
Monocytes Absolute: 1.1 10*3/uL — ABNORMAL HIGH (ref 0.1–1.0)
Monocytes Relative: 6 %
Neutro Abs: 15.3 10*3/uL — ABNORMAL HIGH (ref 1.7–7.7)
Neutrophils Relative %: 85 %
Platelets: 243 10*3/uL (ref 150–400)
RBC: 4.28 MIL/uL (ref 3.87–5.11)
RDW: 12.9 % (ref 11.5–15.5)
WBC: 18 10*3/uL — ABNORMAL HIGH (ref 4.0–10.5)
nRBC: 0 % (ref 0.0–0.2)

## 2020-06-03 LAB — LACTIC ACID, PLASMA: Lactic Acid, Venous: 1.1 mmol/L (ref 0.5–1.9)

## 2020-06-03 LAB — URINALYSIS, COMPLETE (UACMP) WITH MICROSCOPIC
Bacteria, UA: NONE SEEN
Bilirubin Urine: NEGATIVE
Glucose, UA: NEGATIVE mg/dL
Ketones, ur: NEGATIVE mg/dL
Leukocytes,Ua: NEGATIVE
Nitrite: NEGATIVE
Protein, ur: NEGATIVE mg/dL
Specific Gravity, Urine: 1.008 (ref 1.005–1.030)
Squamous Epithelial / HPF: NONE SEEN (ref 0–5)
pH: 6 (ref 5.0–8.0)

## 2020-06-03 LAB — RESPIRATORY PANEL BY RT PCR (FLU A&B, COVID)
Influenza A by PCR: NEGATIVE
Influenza B by PCR: NEGATIVE
SARS Coronavirus 2 by RT PCR: NEGATIVE

## 2020-06-03 LAB — PROTIME-INR
INR: 1.5 — ABNORMAL HIGH (ref 0.8–1.2)
Prothrombin Time: 17.7 seconds — ABNORMAL HIGH (ref 11.4–15.2)

## 2020-06-03 MED ORDER — DOXYCYCLINE MONOHYDRATE 100 MG PO TABS: 100.0000 mg | ORAL_TABLET | Freq: Two times a day (BID) | ORAL | 0 refills | Status: DC

## 2020-06-03 MED ORDER — SODIUM CHLORIDE 0.9 % IV BOLUS
1000.0000 mL | Freq: Once | INTRAVENOUS | Status: AC
  Administered 2020-06-03: 1000 mL via INTRAVENOUS

## 2020-06-03 MED ORDER — ACETAMINOPHEN 325 MG PO TABS
650.0000 mg | ORAL_TABLET | Freq: Once | ORAL | Status: AC | PRN
  Administered 2020-06-03: 650 mg via ORAL
  Filled 2020-06-03: qty 2

## 2020-06-03 MED ORDER — SODIUM CHLORIDE 0.9 % IV SOLN
2.0000 g | Freq: Once | INTRAVENOUS | Status: AC
  Administered 2020-06-03: 2 g via INTRAVENOUS
  Filled 2020-06-03: qty 20

## 2020-06-03 MED ORDER — VANCOMYCIN HCL IN DEXTROSE 1-5 GM/200ML-% IV SOLN
1000.0000 mg | Freq: Once | INTRAVENOUS | Status: AC
  Administered 2020-06-03: 1000 mg via INTRAVENOUS
  Filled 2020-06-03: qty 200

## 2020-06-03 MED ORDER — AMOXICILLIN-POT CLAVULANATE 875-125 MG PO TABS: 1.0000 | ORAL_TABLET | Freq: Two times a day (BID) | ORAL | 0 refills | Status: AC

## 2020-06-03 NOTE — ED Provider Notes (Addendum)
[facility] Emergency Department Provider Note   ____________________________________________   First MD Initiated Contact with Patient 06/03/20 1747     (approximate)  I have reviewed the triage vital signs and the nursing notes.   HISTORY  Chief Complaint Wound Infection    HPI Kristin James is a 64 y.o. female who said she thought she was bitten by a spider several days ago.  She fell 2 weeks ago and landed on the elbow but the arm has been swelling painful for couple days.  She is red and swollen from the fingertips to about 3 inches above her elbow.  Patient's GFR is lower her white count is 18,000.  I have offered her admission and recommended admission but she does not want to stay in the hospital.  She is worried about Covid.         Past Medical History:  Diagnosis Date  . Embolus to lower extremity ([place])   . Hiatal hernia   . Hypercholesteremia   . Hyperglycemia   . Hypertension   . Osteoarthritis   . Peripheral arterial disease (HCC)    embolic disease   . Proteinuria   . Spinal stenosis at L4-L5 level   . Thrombophlebitis of posterior tibial vein ([place]) 06/01/2014  . Trigeminal neuralgia 08/2016    Patient Active Problem List   Diagnosis Date Noted  . Joint pain 03/31/2020  . Vaginal itching 12/10/2019  . Ataxic gait 11/23/2019  . Elevated alkaline phosphatase level 09/17/2019  . PAD (peripheral artery disease) ([place]) 10/24/2017  . Elevated hemoglobin ([place]) 06/08/2017  . Left facial pain 04/23/2017  . Status post gamma knife treatment 04/23/2017  . Trigeminal neuralgia of left side of face 09/19/2016  . Meningioma ([place]) 07/28/2016  . Hot flashes 03/25/2016  . Previous back surgery 01/30/2015  . Health care maintenance 11/12/2014  . Atrial fibrillation with RVR ([place]) 06/14/2014  . Ischemia of lower extremity 06/14/2014  . Spinal stenosis 06/14/2014  . BMI 40.0-44.9, adult ([place]) 06/14/2014  . CKD (chronic kidney disease)  stage 3, GFR 30-59 ml/min (HCC) 06/01/2014  . Back pain 02/25/2014  . Left leg weakness 11/08/2013  . Chest pain 05/17/2013  . Hypertension 09/24/2012  . Hypercholesterolemia 09/24/2012  . Hyperglycemia 09/24/2012    Past Surgical History:  Procedure Laterality Date  . AORTOGRAM    . BACK SURGERY  11/13/2014  . CHOLECYSTECTOMY    . COLONOSCOPY WITH PROPOFOL N/A 08/15/2016   Procedure: COLONOSCOPY WITH PROPOFOL;  Surgeon: Lollie Sails, MD;  Location: [facility] ENDOSCOPY;  Service: Endoscopy;  Laterality: N/A;  . KNEE ARTHROSCOPY    . LOWER EXTREMITY ANGIOGRAM      Prior to Admission medications   Medication Sig Start Date End Date Taking? Authorizing Provider  amiodarone (PACERONE) 200 MG tablet TAKE 1 TABLET BY MOUTH ONCE DAILY 09/12/19   Minna Merritts, MD  amitriptyline (ELAVIL) 25 MG tablet TAKE 1 TABLET(25 MG) BY MOUTH AT BEDTIME 02/07/20   Sater, [name], MD  amLODipine (NORVASC) 5 MG tablet TAKE 1 TABLET(5 MG) BY MOUTH TWICE DAILY 03/19/20   [name], MD  amoxicillin-clavulanate (AUGMENTIN) 875-125 MG tablet Take 1 tablet by mouth 2 (two) times daily for 10 days. 06/03/20 06/13/20  Nena Polio, MD  atenolol (TENORMIN) 25 MG tablet TAKE 1 TABLET(25 MG) BY MOUTH DAILY 01/19/20   Minna Merritts, MD  atorvastatin (LIPITOR) 40 MG tablet TAKE 1 TABLET BY MOUTH ONCE DAILY 06/16/19   Minna Merritts, MD  doxycycline (ADOXA) 100 MG tablet Take 1 tablet (100 mg total) by mouth 2 (two) times daily. 06/03/20   Nena Polio, MD  ELIQUIS 5 MG TABS tablet TAKE 1 TABLET BY MOUTH TWICE DAILY 03/12/20   Minna Merritts, MD  lamoTRIgine (LAMICTAL) 100 MG tablet TAKE 1 TABLET(100 MG) BY MOUTH TWICE DAILY 04/20/20   Sater, [name], MD  losartan-hydrochlorothiazide (HYZAAR) 100-12.5 MG tablet TAKE 1 TABLET BY MOUTH DAILY 03/16/20   [name], MD  nystatin cream (MYCOSTATIN) Apply 1 application topically 2 (two) times daily. 11/29/19   [name], MD  Oxcarbazepine  (TRILEPTAL) 300 MG tablet Take 1 tablet (300 mg total) by mouth 2 (two) times daily. 05/26/19   Sater, [name], MD    Allergies Patient has no known allergies.  Family History  Problem Relation Age of Onset  . Hypertension Mother   . Stroke Father        3 strokes  . Hypertension Father   . Breast cancer Paternal Grandmother   . Colon cancer Neg Hx     Social History Social History   Tobacco Use  . Smoking status: Never Smoker  . Smokeless tobacco: Never Used  Substance Use Topics  . Alcohol use: Yes    Alcohol/week: 0.0 standard drinks    Comment: occ. wine  . Drug use: No    Review of Systems  Constitutional:  fever/chills Eyes: No visual changes. ENT: No sore throat. Cardiovascular: Denies chest pain. Respiratory: Denies shortness of breath. Gastrointestinal: No abdominal pain.  No nausea, no vomiting.  No diarrhea.  No constipation. Genitourinary: Negative for dysuria. Musculoskeletal: Negative for back pain. Skin: Negative for rash. Neurological: Negative for headaches, focal weakness   ____________________________________________   PHYSICAL EXAM:  VITAL SIGNS: ED Triage Vitals  Enc Vitals Group     BP 06/03/20 1632 124/65     Pulse Rate 06/03/20 1632 85     Resp 06/03/20 1632 18     Temp 06/03/20 1616 (!) 102.2 F (39 C)     Temp Source 06/03/20 1616 Oral     SpO2 06/03/20 1632 98 %     Weight 06/03/20 1618 200 lb (90.7 kg)     Height 06/03/20 1618 5' (1.524 m)     Head Circumference --      Peak Flow --      Pain Score 06/03/20 1618 7     Pain Loc --      Pain Edu? --      Excl. in [place]? --     Constitutional: Alert and oriented. Well appearing and in no acute distress. Eyes: Conjunctivae are normal. PER Head: Atraumatic. Nose: No congestion/rhinnorhea. Mouth/Throat: Mucous membranes are moist.  Oropharynx non-erythematous. Neck: No stridor.  ardiovascular: Normal rate, regular rhythm. Grossly normal heart sounds.  Good peripheral  circulation. Respiratory: Normal respiratory effort.  No retractions. Lungs CTAB. Gastrointestinal: Soft and nontender. No distention. No abdominal bruits. No CVA tenderness. Musculoskeletal: No lower extremity tenderness nor edema.  Right arm is red and swollen hot somewhat tender from the fingertips to about 3 inches above the elbow.  Patient has full range of motion of the elbow  Neurologic:  Normal speech and language. No gross focal neurologic deficits are appreciated. Skin:  Skin is warm, dry and intact. No rash noted.  This is to of body except for the elbow and right arm.   ____________________________________________   LABS (all labs ordered are listed, but only abnormal results are displayed)  Labs Reviewed  COMPREHENSIVE METABOLIC PANEL - Abnormal; Notable for the following components:      Result Value   Glucose, Bld 115 (*)    BUN 39 (*)    Creatinine, Ser 1.80 (*)    GFR calc non Af Amer 29 (*)    GFR calc Af Amer 34 (*)    All other components within normal limits  CBC WITH DIFFERENTIAL/PLATELET - Abnormal; Notable for the following components:   WBC 18.0 (*)    Neutro Abs 15.3 (*)    Monocytes Absolute 1.1 (*)    Abs Immature Granulocytes 0.13 (*)    All other components within normal limits  PROTIME-INR - Abnormal; Notable for the following components:   Prothrombin Time 17.7 (*)    INR 1.5 (*)    All other components within normal limits  URINALYSIS, COMPLETE (UACMP) WITH MICROSCOPIC - Abnormal; Notable for the following components:   Color, Urine STRAW (*)    APPearance CLEAR (*)    Hgb urine dipstick SMALL (*)    All other components within normal limits  BASIC METABOLIC PANEL - Abnormal; Notable for the following components:   Glucose, Bld 117 (*)    BUN 31 (*)    Creatinine, Ser 1.50 (*)    Calcium 8.0 (*)    GFR calc non Af Amer 36 (*)    GFR calc Af Amer 42 (*)    All other components within normal limits  RESPIRATORY PANEL BY RT PCR (FLU A&B,  COVID)  CULTURE, BLOOD (ROUTINE X 2)  CULTURE, BLOOD (ROUTINE X 2)  LACTIC ACID, PLASMA   ____________________________________________  EKG   ____________________________________________  RADIOLOGY Gertha Calkin, personally viewed and evaluated these images (plain radiographs) as part of my medical decision making, as well as reviewing the written report by the radiologist.  ED MD interpretation: Elbow read by radiology reviewed by me shows no acute disease  Official radiology report(s): DG Elbow Complete Right  Result Date: 06/03/2020 CLINICAL DATA:  Pain, swelling and redness about the right elbow. No known injury. EXAM: RIGHT ELBOW - COMPLETE 3+ VIEW COMPARISON:  None. FINDINGS: There is no acute bony or joint abnormality. Mild spurring at the medial and lateral epicondyles of the humerus is incidentally noted. No joint effusion. No soft tissue gas or radiopaque foreign body. IMPRESSION: No acute abnormality. Electronically Signed   By: Inge Rise M.D.   On: 06/03/2020 17:01    ____________________________________________   PROCEDURES  Procedure(s) performed (including Critical Care):  Procedures   ____________________________________________   INITIAL IMPRESSION / ASSESSMENT AND PLAN / ED COURSE Patient refuses to stay.  She is worried about Covid.  She is aware that her kidney function is not doing well on this could affect how she does with her cellulitis.  She will also is aware that the cellulitis is very severe and could get much worse even interfering with her ability to use her arm chronically.  Her husband is in the room when I explained this to her third time and understands as well.  They both understand that it is much safer for her to stay in the hospital overnight and get IV antibiotics and to go home but she does not want to stay.  I will let her go.  She promises to return if she is worse even if she gets worse tonight.  She will return here  tomorrow or see her doctor tomorrow for recheck.  ----------------------------------------- 11:12 PM on 06/03/2020 -----------------------------------------  I need to add that patient has been awake alert oriented and completely understands the risks and she is taking as far as severe worsening of her condition.             ____________________________________________   FINAL CLINICAL IMPRESSION(S) / ED DIAGNOSES  Final diagnoses:  Cellulitis of right upper extremity     ED Discharge Orders         Ordered    amoxicillin-clavulanate (AUGMENTIN) 875-125 MG tablet  2 times daily        06/03/20 2307    doxycycline (ADOXA) 100 MG tablet  2 times daily        06/03/20 2307          *Please note:  Kristin James was evaluated in Emergency Department on 06/03/2020 for the symptoms described in the history of present illness. She was evaluated in the context of the global COVID-19 pandemic, which necessitated consideration that the patient might be at risk for infection with the SARS-CoV-2 virus that causes COVID-19. Institutional protocols and algorithms that pertain to the evaluation of patients at risk for COVID-19 are in a state of rapid change based on information released by regulatory bodies including the CDC and federal and state organizations. These policies and algorithms were followed during the patient's care in the ED.  Some ED evaluations and interventions may be delayed as a result of limited staffing during and the pandemic.*   Note:  This document was prepared using Dragon voice recognition software and may include unintentional dictation errors.    Nena Polio, MD 06/03/20 2312    Nena Polio, MD 06/03/20 [phone]

## 2020-06-03 NOTE — Discharge Instructions (Addendum)
Please remember that I think you would be much safer for you to stay in the hospital.  Since you will and I will have you take Augmentin 1 twice a day and doxycycline 1 twice a day.  Please separated by about 6 hours.  Please return if the redness goes outside of the line that I drew.  If it goes more than an inch or 2 outside the line tonight please return by ambulance.  Otherwise you can return during the day or follow-up with your doctor later today.  Please also remember that your kidney function is not as good as it should be.  That will have to be investigated as well.  Please make sure you are drinking a lot of water and take the Augmentin with food as it causes less diarrhea that way.

## 2020-06-03 NOTE — ED Triage Notes (Addendum)
Pt here for right arm swelling and redness.  Pt thinks bit by spider. Right arm has swelling from hand to above elbow.  Redness noted. Hot to touch. Febrile in triage.  Worst swelling and pain over elbow.  Did have fall two weeks ago and landed on this elbow

## 2020-06-03 NOTE — ED Notes (Signed)
Peripheral IV discontinued. Catheter intact. No signs of infiltration or redness. Gauze applied to IV site.   Peripheral IV discontinued. Catheter intact. No signs of infiltration or redness. Gauze applied to IV site.   Discharge instructions reviewed with patient. Questions fielded by this RN. Patient verbalizes understanding of instructions. Patient discharged home in stable condition per Dr Cinda Quest . No acute distress noted at time of discharge.   Pt wheeled to front of lobby into car

## 2020-06-07 ENCOUNTER — Other Ambulatory Visit: Payer: Self-pay | Admitting: Cardiovascular Disease

## 2020-06-07 ENCOUNTER — Other Ambulatory Visit: Payer: Self-pay | Admitting: Neurology

## 2020-06-08 ENCOUNTER — Other Ambulatory Visit: Payer: Self-pay | Admitting: Cardiovascular Disease

## 2020-06-08 ENCOUNTER — Other Ambulatory Visit: Payer: Self-pay

## 2020-06-08 ENCOUNTER — Ambulatory Visit
Admission: RE | Admit: 2020-06-08 | Discharge: 2020-06-08 | Disposition: A | Discharge status: Home / Self Care | Payer: 59 | Source: Ambulatory Visit | Attending: Internal Medicine | Admitting: Internal Medicine

## 2020-06-08 DIAGNOSIS — Z1231 Encounter for screening mammogram for malignant neoplasm of breast: Secondary | ICD-10-CM | POA: Diagnosis not present

## 2020-06-08 LAB — CULTURE, BLOOD (ROUTINE X 2)
Culture: NO GROWTH
Culture: NO GROWTH
Special Requests: ADEQUATE
Special Requests: ADEQUATE

## 2020-06-13 ENCOUNTER — Other Ambulatory Visit: Payer: Self-pay | Admitting: *Deleted

## 2020-06-13 NOTE — Patient Outreach (Signed)
[facility]) Care Management  06/13/2020  Kristin James 1956-08-21 [id number]  Initial telephone assessment. No answer, left message, requested return call.  F/U for ED visit: Cellulitis R arm, ? Insect bite, vs injury from fall.  [name]. Myrtie Neither, MSN, [facility] Gerontological Nurse Practitioner [facility] Care Management [phone]

## 2020-06-15 ENCOUNTER — Other Ambulatory Visit: Payer: Self-pay | Admitting: *Deleted

## 2020-06-15 NOTE — Patient Outreach (Signed)
[facility]) Care Management  06/15/2020  LAQUIA ROSANO 09/04/1955 [id number]  Second attempt to talk with pt for an initial telephone assessment. Pt was recently in the ED for cellulitis.  Left message, requested a return call, will send an unsuccessful letter and outreach again in 2 weeks.  [name]. Myrtie Neither, MSN, [facility] Gerontological Nurse Practitioner [facility] Care Management [phone]

## 2020-06-16 ENCOUNTER — Encounter: Payer: Self-pay | Admitting: Internal Medicine

## 2020-06-16 DIAGNOSIS — N1832 Chronic kidney disease, stage 3b: Secondary | ICD-10-CM

## 2020-06-19 NOTE — Telephone Encounter (Signed)
Patient says she is having no symptoms just thought since her kidney function has been decreased the last 3 results. See lab 06/03/20

## 2020-06-20 NOTE — Telephone Encounter (Signed)
Patient is okay with nephrology referral being placed.

## 2020-06-20 NOTE — Telephone Encounter (Signed)
Order placed for nephrology referral.   °

## 2020-06-20 NOTE — Telephone Encounter (Signed)
Notify pt that she actually needs to see a nephrologist.  (instead of urology).  I can refer her if she is agreeable.  We usually use [facility] here in town, but I can refer her wherever she prefers.

## 2020-06-21 ENCOUNTER — Ambulatory Visit (INDEPENDENT_AMBULATORY_CARE_PROVIDER_SITE_OTHER): Payer: 59 | Admitting: Nurse Practitioner

## 2020-06-21 ENCOUNTER — Encounter: Payer: Self-pay | Admitting: Nurse Practitioner

## 2020-06-21 ENCOUNTER — Other Ambulatory Visit: Payer: Self-pay

## 2020-06-21 VITALS — BP 120/70 | HR 110 | Ht 60.0 in | Wt 207.0 lb

## 2020-06-21 DIAGNOSIS — R0683 Snoring: Secondary | ICD-10-CM | POA: Diagnosis not present

## 2020-06-21 DIAGNOSIS — N183 Chronic kidney disease, stage 3 unspecified: Secondary | ICD-10-CM

## 2020-06-21 DIAGNOSIS — I4891 Unspecified atrial fibrillation: Secondary | ICD-10-CM

## 2020-06-21 DIAGNOSIS — E782 Mixed hyperlipidemia: Secondary | ICD-10-CM

## 2020-06-21 DIAGNOSIS — I10 Essential (primary) hypertension: Secondary | ICD-10-CM

## 2020-06-21 MED ORDER — AMIODARONE HCL 200 MG PO TABS: 200.0000 mg | ORAL_TABLET | Freq: Two times a day (BID) | ORAL | 2 refills | Status: DC

## 2020-06-21 NOTE — Patient Instructions (Signed)
Medication Instructions:  Your physician has recommended you make the following change in your medication:  1- INCREASE Amiodarone 200 mg by mouth two times a day.  *If you need a refill on your cardiac medications before your next appointment, please call your pharmacy*  Lab Work: Your physician recommends that you return for lab work in: TODAY - BMET, Mg, TSH.  If you have labs (blood work) drawn today and your tests are completely normal, you will receive your results only by: Marland Kitchen MyChart Message (if you have MyChart) OR . A paper copy in the mail If you have any lab test that is abnormal or we need to change your treatment, we will call you to review the results.  Testing/Procedures: none  Follow-Up: At [facility], you and your health needs are our priority.  As part of our continuing mission to provide you with exceptional heart care, we have created designated Provider Care Teams.  These Care Teams include your primary Cardiologist (physician) and Advanced Practice Providers (APPs -  Physician Assistants and Nurse Practitioners) who all work together to provide you with the care you need, when you need it.  We recommend signing up for the patient portal called "MyChart".  Sign up information is provided on this After Visit Summary.  MyChart is used to connect with patients for Virtual Visits (Telemedicine).  Patients are able to view lab/test results, encounter notes, upcoming appointments, etc.  Non-urgent messages can be sent to your provider as well.   To learn more about what you can do with MyChart, go to [email or web address].    Your next appointment:   7-10 day(s)  The format for your next appointment:   In Person  Provider:   You may see Ida Rogue, MD or one of the following Advanced Practice Providers on your designated Care Team:    Murray Hodgkins, NP  Christell Faith, PA-C  Marrianne Mood, PA-C  [place], [place]

## 2020-06-21 NOTE — Progress Notes (Signed)
Office Visit    Patient Name: Kristin James Date of Encounter: 06/21/2020  Primary Care Provider:  [name], MD Primary Cardiologist:  Ida Rogue, MD  Chief Complaint    64 year old female with a history of paroxysmal atrial fibrillation, left lower extremity embolism status post thrombolysis in September 2015, hypertension, hyperlipidemia, meningioma status post gamma knife (March 2018), and trigeminal neuralgia, who presents for follow-up related to atrial fibrillation.  Past Medical History    Past Medical History:  Diagnosis Date  . Embolus to lower extremity ([place])    a. 05/2014 s/p thrombolysis of LLE.  Marland Kitchen Hiatal hernia   . History of stress test    a. 05/2014 lexiscan MV: EF 67%, no ischemia/infarct.  . Hypercholesteremia   . Hyperglycemia   . Hypertension   . Left Cerebellopontine Angle Meningioma ([place])    a. 10/2016 s/p Gamma knife.  . Osteoarthritis   . PAF (paroxysmal atrial fibrillation) ([place])    a. Dx 10/2014->converted on amio. CHA2DS2VASc = 2-3-->Eliquis.  . Proteinuria   . Spinal stenosis at L4-L5 level   . Thrombophlebitis of posterior tibial vein ([place]) 06/01/2014  . Trigeminal neuralgia 08/2016   Past Surgical History:  Procedure Laterality Date  . AORTOGRAM    . BACK SURGERY  11/13/2014  . CHOLECYSTECTOMY    . COLONOSCOPY WITH PROPOFOL N/A 08/15/2016   Procedure: COLONOSCOPY WITH PROPOFOL;  Surgeon: Lollie Sails, MD;  Location: [facility] ENDOSCOPY;  Service: Endoscopy;  Laterality: N/A;  . KNEE ARTHROSCOPY    . LOWER EXTREMITY ANGIOGRAM      Allergies  No Known Allergies  History of Present Illness    64 year old female with the above past medical history including paroxysmal atrial fibrillation, left lower extremity embolus status post thrombolysis in September 2015, hypertension, hyperlipidemia, meningioma status post gamma knife (March 2018), and trigeminal neuralgia.  She previously underwent stress testing in September 2015, which  was nonischemic and showed normal LV function.  She was incidentally found to have atrial fibrillation at the time of back surgery in March 2016.  She converted on amiodarone therapy and has been managed over the years with amiodarone, atenolol, and Eliquis.    She was last seen in cardiology clinic in September 2020, at which time she was doing well.  She notes that since then, she has continued to do well and denies chest pain, dyspnea, palpitations, PND, orthopnea, dizziness, syncope, edema, or early satiety.  She did have lab work with her primary care provider earlier this month which showed a rising creatinine to 1.80.  She has been referred to nephrology.  She is in atrial fibrillation today at a rate of 110 bpm but is completely asymptomatic.  She checks her blood pressure every few days and typically sees a heart rate in the 60s.  She last checked blood pressure/heart rate about 5 days ago.  Home Medications    Prior to Admission medications   Medication Sig Start Date End Date Taking? Authorizing Provider  amiodarone (PACERONE) 200 MG tablet TAKE 1 TABLET BY MOUTH EVERY DAY 06/08/20  Yes Gollan, Kathlene November, MD  amitriptyline (ELAVIL) 25 MG tablet TAKE 1 TABLET(25 MG) BY MOUTH AT BEDTIME 06/07/20  Yes Sater, Richard A, MD  amLODipine (NORVASC) 5 MG tablet TAKE 1 TABLET(5 MG) BY MOUTH TWICE DAILY 03/19/20  Yes [name], MD  atenolol (TENORMIN) 25 MG tablet TAKE 1 TABLET(25 MG) BY MOUTH DAILY 01/19/20  Yes Gollan, Kathlene November, MD  atorvastatin (LIPITOR) 40 MG tablet  TAKE 1 TABLET BY MOUTH ONCE DAILY 06/07/20  Yes Gollan, Kathlene November, MD  ELIQUIS 5 MG TABS tablet TAKE 1 TABLET BY MOUTH TWICE DAILY 03/12/20  Yes Gollan, Kathlene November, MD  lamoTRIgine (LAMICTAL) 100 MG tablet TAKE 1 TABLET(100 MG) BY MOUTH TWICE DAILY 04/20/20  Yes Sater, [name], MD  losartan-hydrochlorothiazide (HYZAAR) 100-12.5 MG tablet TAKE 1 TABLET BY MOUTH DAILY 03/16/20  Yes [name], MD  nystatin cream (MYCOSTATIN) Apply  1 application topically 2 (two) times daily. 11/29/19  Yes [name], MD  Oxcarbazepine (TRILEPTAL) 300 MG tablet Take 1 tablet (300 mg total) by mouth 2 (two) times daily. 05/26/19  Yes Sater, [name], MD  doxycycline (ADOXA) 100 MG tablet Take 1 tablet (100 mg total) by mouth 2 (two) times daily. 06/03/20   Nena Polio, MD    Review of Systems    She denies chest pain, dyspnea, palpitations, PND, orthopnea, dizziness, syncope, edema, or early satiety.  She was not aware that she is in A. fib today.  All other systems reviewed and are otherwise negative except as noted above.  Physical Exam    VS:  BP 120/70 (BP Location: Left Arm, Patient Position: Sitting, Cuff Size: Normal)   Pulse (!) 110   Ht 5' (1.524 m)   Wt 207 lb (93.9 kg)   SpO2 98%   BMI 40.43 kg/m  , BMI Body mass index is 40.43 kg/m. GEN: Well nourished, well developed, in no acute distress. HEENT: normal. Neck: Supple, no JVD, carotid bruits, or masses. Cardiac: Irregularly irregular, no murmurs, rubs, or gallops. No clubbing, cyanosis, edema.  Radials/PT 2+ and equal bilaterally.  Respiratory:  Respirations regular and unlabored, clear to auscultation bilaterally. GI: Soft, nontender, nondistended, BS + x 4. MS: no deformity or atrophy. Skin: warm and dry, no rash. Neuro:  Strength and sensation are intact. Psych: Normal affect.  Accessory Clinical Findings    ECG personally reviewed by me today -coarse atrial fibrillation, 110, prior septal infarct- no acute changes.  Lab Results  Component Value Date   WBC 18.0 (H) 06/03/2020   HGB 13.7 06/03/2020   HCT 38.8 06/03/2020   MCV 90.7 06/03/2020   PLT 243 06/03/2020   Lab Results  Component Value Date   CREATININE 1.50 (H) 06/03/2020   BUN 31 (H) 06/03/2020   NA 139 06/03/2020   K 3.6 06/03/2020   CL 105 06/03/2020   CO2 24 06/03/2020   Lab Results  Component Value Date   ALT 26 06/03/2020   AST 26 06/03/2020   GGT 56 09/08/2019   ALKPHOS  117 06/03/2020   BILITOT 1.1 06/03/2020   Lab Results  Component Value Date   CHOL 168 03/30/2020   HDL 49.50 03/30/2020   LDLCALC 97 03/30/2020   TRIG 110.0 03/30/2020   CHOLHDL 3 03/30/2020    Lab Results  Component Value Date   HGBA1C 6.1 03/30/2020    Assessment & Plan    1.  Paroxysmal atrial fibrillation with rapid ventricular response: Patient presents today for routine follow-up and is noted to be in A. fib at a rate of 110.  She was completely unaware of this and is asymptomatic.  She does check her blood pressure at home every few days and typically sees heart rates in the 60s.  She last noted a heart rate in the 60s about 5 days ago.  I will follow-up a basic metabolic panel, magnesium, and TSH today.  I have asked her to increase  her amiodarone to 200 mg twice daily and continue atenolol 25 mg daily.  I have also asked her to check her heart rate daily with a plan to return for visit in approximately 1 week to reevaluate rhythm and heart rate trends at home and determine whether or not direct-current cardioversion is appropriate (she reports 100% compliance with Eliquis).  I will order an echo but prefer to have this performed once she is back in sinus rhythm.  She does report a history of snoring and I will also place a referral to pulmonology for sleep eval.  2.  Essential hypertension: Stable on beta-blocker and amlodipine therapy.  3.  Hyperlipidemia: LDL of 97 on statin therapy.  4.  History of left lower extremity embolus: On Eliquis therapy in the setting of A. Fib.  5.  CKD 3: Creatinine elevated since April of this year.  She has been referred to nephrology by primary care.  6.  Disposition: Follow-up lab work today.  Follow-up in clinic in approximately 7 to 10 days to reevaluate rhythm and heart rate trends at home.  Will arrange for echocardiogram and sleep referral.  Murray Hodgkins, NP 06/21/2020, 12:23 PM

## 2020-06-22 ENCOUNTER — Telehealth: Payer: Self-pay | Admitting: Internal Medicine

## 2020-06-22 LAB — TSH: TSH: 2.91 u[IU]/mL (ref 0.450–4.500)

## 2020-06-22 LAB — BASIC METABOLIC PANEL
BUN/Creatinine Ratio: 28 (ref 12–28)
BUN: 40 mg/dL — ABNORMAL HIGH (ref 8–27)
CO2: 21 mmol/L (ref 20–29)
Calcium: 9.2 mg/dL (ref 8.7–10.3)
Chloride: 106 mmol/L (ref 96–106)
Creatinine, Ser: 1.41 mg/dL — ABNORMAL HIGH (ref 0.57–1.00)
GFR calc Af Amer: 45 mL/min/{1.73_m2} — ABNORMAL LOW (ref >59)
GFR calc non Af Amer: 39 mL/min/{1.73_m2} — ABNORMAL LOW (ref >59)
Glucose: 100 mg/dL — ABNORMAL HIGH (ref 65–99)
Potassium: 4.9 mmol/L (ref 3.5–5.2)
Sodium: 143 mmol/L (ref 134–144)

## 2020-06-22 LAB — MAGNESIUM: Magnesium: 2 mg/dL (ref 1.6–2.3)

## 2020-06-22 NOTE — Telephone Encounter (Signed)
Patient was returning call about referral for urology

## 2020-06-25 ENCOUNTER — Other Ambulatory Visit: Payer: Self-pay | Admitting: Neurology

## 2020-06-26 ENCOUNTER — Telehealth: Payer: Self-pay | Admitting: Internal Medicine

## 2020-06-26 NOTE — Telephone Encounter (Signed)
I am sorry she meant nephrology.

## 2020-06-26 NOTE — Telephone Encounter (Signed)
lft vm returning pt call I explained to pt it most likely was [facility] calling her about the referral. I left phone number to call [facility].

## 2020-06-26 NOTE — Telephone Encounter (Signed)
Good morning!  No ma'am pt does not have a neurology referral.

## 2020-06-26 NOTE — Telephone Encounter (Signed)
Its ok. :) no I didn't call pt. Maybe [facility] called her. I'll call her.

## 2020-06-29 ENCOUNTER — Other Ambulatory Visit: Payer: Self-pay | Admitting: *Deleted

## 2020-06-29 NOTE — Patient Outreach (Signed)
[facility]) Care Management  06/29/2020  EYLA TALLON 1956/05/24 [id number]   Third attempt to engage pt for [facility] care management. Spoke with Ms. Corsello today. She reports she did receive the letter explaining our program and benefits and she believes she does not need this service at this time.  Encouraged her to keep my number and name in case she has a need in the future.  [name]. Myrtie Neither, MSN, [facility] Gerontological Nurse Practitioner [facility] Care Management [phone]

## 2020-07-10 ENCOUNTER — Other Ambulatory Visit: Payer: Self-pay

## 2020-07-10 ENCOUNTER — Ambulatory Visit (INDEPENDENT_AMBULATORY_CARE_PROVIDER_SITE_OTHER): Payer: PPO

## 2020-07-10 DIAGNOSIS — I4891 Unspecified atrial fibrillation: Secondary | ICD-10-CM

## 2020-07-11 ENCOUNTER — Encounter: Payer: Self-pay | Admitting: Nurse Practitioner

## 2020-07-11 ENCOUNTER — Ambulatory Visit (INDEPENDENT_AMBULATORY_CARE_PROVIDER_SITE_OTHER): Payer: PPO | Admitting: Nurse Practitioner

## 2020-07-11 VITALS — BP 162/100 | HR 103 | Ht 60.0 in | Wt 206.0 lb

## 2020-07-11 DIAGNOSIS — I480 Paroxysmal atrial fibrillation: Secondary | ICD-10-CM | POA: Diagnosis not present

## 2020-07-11 DIAGNOSIS — I10 Essential (primary) hypertension: Secondary | ICD-10-CM | POA: Diagnosis not present

## 2020-07-11 DIAGNOSIS — N183 Chronic kidney disease, stage 3 unspecified: Secondary | ICD-10-CM | POA: Diagnosis not present

## 2020-07-11 DIAGNOSIS — E782 Mixed hyperlipidemia: Secondary | ICD-10-CM

## 2020-07-11 DIAGNOSIS — Z23 Encounter for immunization: Secondary | ICD-10-CM

## 2020-07-11 LAB — ECHOCARDIOGRAM COMPLETE
AV Area VTI: 1.54 cm2
MV VTI: 1.86 cm2
S' Lateral: 3 cm

## 2020-07-11 MED ORDER — ATENOLOL 25 MG PO TABS: 25.0000 mg | ORAL_TABLET | Freq: Two times a day (BID) | ORAL | 0 refills | Status: DC

## 2020-07-11 NOTE — H&P (View-Only) (Signed)
Office Visit    Patient Name: Kristin James Date of Encounter: 07/11/2020  Primary Care Provider:  [name], MD Primary Cardiologist:  Ida Rogue, MD  Chief Complaint    64 year old female with a history of paroxysmal atrial fibrillation, left lower extremity embolism status post thrombolysis in September 2015, hypertension, hyperlipidemia, meningioma status post gamma knife (March 2018), and trigeminal neuralgia, who presents for follow-up related to atrial fibrillation.  Past Medical History    Past Medical History:  Diagnosis Date  . Embolus to lower extremity ([place])    a. 05/2014 s/p thrombolysis of LLE.  Marland Kitchen Hiatal hernia   . History of echocardiogram    a. 07/2020 Echo: EF 55-60%, no rwma, Nl RV size/fxn. Mildly dil LA. Mild MR/MS. Sev mitral annular Ca2+. Mild Ao sclerosis w/o stenosis.  Marland Kitchen History of stress test    a. 05/2014 lexiscan MV: EF 67%, no ischemia/infarct.  . Hypercholesteremia   . Hyperglycemia   . Hypertension   . Left Cerebellopontine Angle Meningioma ([place])    a. 10/2016 s/p Gamma knife.  . Osteoarthritis   . PAF (paroxysmal atrial fibrillation) ([place])    a. Dx 10/2014->converted on amio. CHA2DS2VASc = 3-4-->Eliquis.  . Proteinuria   . Spinal stenosis at L4-L5 level   . Thrombophlebitis of posterior tibial vein ([place]) 06/01/2014  . Trigeminal neuralgia 08/2016   Past Surgical History:  Procedure Laterality Date  . AORTOGRAM    . BACK SURGERY  11/13/2014  . CHOLECYSTECTOMY    . COLONOSCOPY WITH PROPOFOL N/A 08/15/2016   Procedure: COLONOSCOPY WITH PROPOFOL;  Surgeon: Lollie Sails, MD;  Location: [facility] ENDOSCOPY;  Service: Endoscopy;  Laterality: N/A;  . KNEE ARTHROSCOPY    . LOWER EXTREMITY ANGIOGRAM      Allergies  No Known Allergies  History of Present Illness    64 year old female with the above past medical history including paroxysmal atrial fibrillation, left lower extremity embolus status post thrombolysis in September 2015,  hypertension, hyperlipidemia, meningioma status post gamma knife (March 2018), and trigeminal neuralgia.  She previously underwent stress testing in September 2015, which was nonischemic and showed normal LV function.  She was incidentally found to have atrial fibrillation at the time of back surgery in March 2016.  She converted on amiodarone therapy and has been managed over the years with amiodarone, atenolol, and Eliquis.  She was last seen in cardiology clinic on October 21 of this year, at which time she was feeling well though she was noted to be tachycardic and in atrial fibrillation with a rate of 110 bpm.  She reported that heart rates typically run in the 60s when she checks her blood pressure at home.  She had had recent lab work showing elevation in creatinine to 1.8 on October 3, the follow-up labs October 21 showed a creatinine of 1.41 with a BUN of 40 (referred to nephrology by pcp).  As she reported good compliance with anticoagulation, I increased her amiodarone to 200 mg twice daily.  Echocardiography was also ordered and in the setting of report of snoring, she was referred to pulmonology for sleep evaluation.  Echo was performed yesterday and showed an EF of 55-60% without regional wall motion abnormalities.  Mild MR/MS were noted.  Ms. Kathan monitored her heart rates over the past 2 weeks and she has typically been trending in the low 100s with occasional dips into the 90s.  She has been noticing more dyspnea on exertion and easy fatigability as well.  She  has tolerated the higher dose of amiodarone well.  Unfortunately, she remains in A. fib today.  She denies chest pain, palpitations, PND, orthopnea, dizziness, syncope, or early satiety.  She does have some degree of chronic mild left lower extremity swelling.  Home Medications    Prior to Admission medications   Medication Sig Start Date End Date Taking? Authorizing Provider  amiodarone (PACERONE) 200 MG tablet Take 1 tablet (200 mg  total) by mouth 2 (two) times daily. 06/21/20   [name], NP  amitriptyline (ELAVIL) 25 MG tablet TAKE 1 TABLET(25 MG) BY MOUTH AT BEDTIME 06/07/20   Sater, [name], MD  amLODipine (NORVASC) 5 MG tablet TAKE 1 TABLET(5 MG) BY MOUTH TWICE DAILY 03/19/20   [name], MD  atenolol (TENORMIN) 25 MG tablet TAKE 1 TABLET(25 MG) BY MOUTH DAILY 01/19/20   Minna Merritts, MD  atorvastatin (LIPITOR) 40 MG tablet TAKE 1 TABLET BY MOUTH ONCE DAILY 06/07/20   Minna Merritts, MD  doxycycline (ADOXA) 100 MG tablet Take 1 tablet (100 mg total) by mouth 2 (two) times daily. 06/03/20   Nena Polio, MD  ELIQUIS 5 MG TABS tablet TAKE 1 TABLET BY MOUTH TWICE DAILY 03/12/20   Minna Merritts, MD  lamoTRIgine (LAMICTAL) 100 MG tablet TAKE 1 TABLET(100 MG) BY MOUTH TWICE DAILY 04/20/20   Sater, [name], MD  losartan-hydrochlorothiazide (HYZAAR) 100-12.5 MG tablet TAKE 1 TABLET BY MOUTH DAILY 03/16/20   [name], MD  nystatin cream (MYCOSTATIN) Apply 1 application topically 2 (two) times daily. 11/29/19   [name], MD  Oxcarbazepine (TRILEPTAL) 300 MG tablet TAKE 1 TABLET(300 MG) BY MOUTH TWICE DAILY 06/25/20   Sater, [name], MD   Family History    Family History  Problem Relation Age of Onset  . Hypertension Mother   . Stroke Father        3 strokes  . Hypertension Father   . Breast cancer Paternal Grandmother   . Colon cancer Neg Hx     Social History    Social History   Socioeconomic History  . Marital status: Married    Spouse name: Not on file  . Number of children: 2  . Years of education: Not on file  . Highest education level: Not on file  Occupational History  . Not on file  Tobacco Use  . Smoking status: Never Smoker  . Smokeless tobacco: Never Used  Vaping Use  . Vaping Use: Never used  Substance and Sexual Activity  . Alcohol use: Yes    Alcohol/week: 0.0 standard drinks    Comment: occ. wine  . Drug use: No  . Sexual activity: Not on  file  Other Topics Concern  . Not on file  Social History Narrative   Right handed    Caffeine use: coffee (1/2 cup per day)   Social Determinants of Health   Financial Resource Strain:   . Difficulty of Paying Living Expenses: Not on file  Food Insecurity:   . Worried About Charity fundraiser in the Last Year: Not on file  . Ran Out of Food in the Last Year: Not on file  Transportation Needs:   . Lack of Transportation (Medical): Not on file  . Lack of Transportation (Non-Medical): Not on file  Physical Activity:   . Days of Exercise per Week: Not on file  . Minutes of Exercise per Session: Not on file  Stress:   . Feeling of Stress : Not on  file  Social Connections:   . Frequency of Communication with Friends and Family: Not on file  . Frequency of Social Gatherings with Friends and Family: Not on file  . Attends Religious Services: Not on file  . Active Member of Clubs or Organizations: Not on file  . Attends Archivist Meetings: Not on file  . Marital Status: Not on file  Intimate Partner Violence:   . Fear of Current or Ex-Partner: Not on file  . Emotionally Abused: Not on file  . Physically Abused: Not on file  . Sexually Abused: Not on file    Review of Systems    In the setting of atrial fibrillation, over the past few weeks, she has noticed an increase in dyspnea on exertion and easy fatigability.  She has some degree of chronic, mild, left lower extremity swelling.  She denies chest pain, palpitations, PND, orthopnea, dizziness, syncope, or early satiety.  All other systems reviewed and are otherwise negative except as noted above.  Physical Exam    VS:  BP (!) 162/100 (BP Location: Left Arm, Patient Position: Sitting, Cuff Size: Normal)   Pulse (!) 103   Ht 5' (1.524 m)   Wt 206 lb (93.4 kg)   SpO2 96%   BMI 40.23 kg/m  , BMI Body mass index is 40.23 kg/m. GEN: Well nourished, well developed, in no acute distress. HEENT: normal. Neck: Supple,  no JVD, carotid bruits, or masses. Cardiac: Irregularly irregular, no murmurs, rubs, or gallops. No clubbing, cyanosis.  1+ left ankle edema.  Radials/PT 2+ and equal bilaterally.  Respiratory:  Respirations regular and unlabored, clear to auscultation bilaterally. GI: Soft, nontender, nondistended, BS + x 4. MS: no deformity or atrophy. Skin: warm and dry, no rash. Neuro:  Strength and sensation are intact. Psych: Normal affect.  Accessory Clinical Findings    ECG personally reviewed by me today -atrial fibrillation, 103, prior anteroseptal infarct - no acute changes.  Lab Results  Component Value Date   WBC 18.0 (H) 06/03/2020   HGB 13.7 06/03/2020   HCT 38.8 06/03/2020   MCV 90.7 06/03/2020   PLT 243 06/03/2020   Lab Results  Component Value Date   CREATININE 1.41 (H) 06/21/2020   BUN 40 (H) 06/21/2020   NA 143 06/21/2020   K 4.9 06/21/2020   CL 106 06/21/2020   CO2 21 06/21/2020   Lab Results  Component Value Date   ALT 26 06/03/2020   AST 26 06/03/2020   GGT 56 09/08/2019   ALKPHOS 117 06/03/2020   BILITOT 1.1 06/03/2020   Lab Results  Component Value Date   CHOL 168 03/30/2020   HDL 49.50 03/30/2020   LDLCALC 97 03/30/2020   TRIG 110.0 03/30/2020   CHOLHDL 3 03/30/2020    Lab Results  Component Value Date   HGBA1C 6.1 03/30/2020   Lab Results  Component Value Date   TSH 2.910 06/21/2020     Assessment & Plan    1.  Paroxysmal atrial fibrillation: Patient was noted to be back in A. fib at routine follow-up on October 21.  She was completely unaware of this.  I increase her amiodarone to 2 mg twice daily at that time.  She has monitored her heart rates over the past 2 weeks and she has mostly been trending in the low 100s with occasional dips into the 90s.  She has noticed an increase in fatigability and dyspnea on exertion.  She continues to deny palpitations though she remains in  A. fib today at 103.  Lab work previously unremarkable for reversible  causes.  Echo shows normal LV function without significant wall motion abnormalities.  Left atrium is mildly dilated.  She has been compliant with amiodarone and Eliquis.  I will increase her atenolol to 25 mg twice daily today.  We discussed the low risk nature of direct current cardioversion today with risks including VF due to general anesthesia or lack of synchronization between the DC shock and the QRS complex, thromboembolus due to insufficient anticoagulant therapy, non-sustained VT, atrial arrhythmia, heart block, bradycardia, transient left bundle branch block, myocardial necrosis, myocardial dysfunction, transient hypotension, pulmonary edema, and skin burn and she is willing to proceed.  She will be Covid tested next week and plan for cardioversion next Thursday.  2.  Essential hypertension: Blood pressure elevated today though home trends show more normal pressures.  Increasing atenolol in the setting of elevated heart rates.  She will continue to follow blood pressure at home.  3.  Hyperlipidemia: LDL 97 on statin therapy.  4.  History of left lower extremity embolus: On chronic Eliquis with chronic, mild left ankle swelling.  5.  CKD 3: Creatinine elevated since April of this year and was most recently 1.41 on October 21.  Following up labs today in the setting of pending cardioversion.  6.  Snoring: Pulmonary referral made at last visit for sleep eval.  7.  Disposition: Follow-up labs today with Covid testing next week.  Plan for direct-current cardioversion next Thursday.  Murray Hodgkins, NP 07/11/2020, 1:32 PM

## 2020-07-11 NOTE — Patient Instructions (Addendum)
Medication Instructions:  Your physician has recommended you make the following change in your medication:   INCREASE Atenolol to 25 mg twice daily. An Rx has been sent to your pharmacy.  *If you need a refill on your cardiac medications before your next appointment, please call your pharmacy*   Lab Work: BMP and CBC today  You will need a COVID test on Tues 07/17/20. Please report to the [facility] medical arts drive up test site between 8am-1pm.  If you have labs (blood work) drawn today and your tests are completely normal, you will receive your results only by: Marland Kitchen MyChart Message (if you have MyChart) OR . A paper copy in the mail If you have any lab test that is abnormal or we need to change your treatment, we will call you to review the results.   Testing/Procedures: Your physician has recommended that you have a Cardioversion (DCCV). Electrical Cardioversion uses a jolt of electricity to your heart either through paddles or wired patches attached to your chest. This is a controlled, usually prescheduled, procedure. Defibrillation is done under light anesthesia in the hospital, and you usually go home the day of the procedure. This is done to get your heart back into a normal rhythm. You are not awake for the procedure. Please see the instruction sheet given to you today.     Follow-Up: At [facility], you and your health needs are our priority.  As part of our continuing mission to provide you with exceptional heart care, we have created designated Provider Care Teams.  These Care Teams include your primary Cardiologist (physician) and Advanced Practice Providers (APPs -  Physician Assistants and Nurse Practitioners) who all work together to provide you with the care you need, when you need it.  We recommend signing up for the patient portal called "MyChart".  Sign up information is provided on this After Visit Summary.  MyChart is used to connect with patients for Virtual Visits  (Telemedicine).  Patients are able to view lab/test results, encounter notes, upcoming appointments, etc.  Non-urgent messages can be sent to your provider as well.   To learn more about what you can do with MyChart, go to [email or web address].    Your next appointment:   3-4 week(s)  The format for your next appointment:   In Person  Provider:   You may see Ida Rogue, MD or one of the following Advanced Practice Providers on your designated Care Team:    Murray Hodgkins, NP  Christell Faith, PA-C  Marrianne Mood, PA-C  Cadence Kathlen Mody, [place]    Other Instructions You are scheduled for a Cardioversion on Thur 07/19/20 with Dr. Rockey Situ Please arrive at the [facility] at 6:30 a.m. on the day of your procedure.  DIET INSTRUCTIONS:  Nothing to eat or drink after midnight except your medications with a              sip of water.         1) Labs: BMP and CBC today. COVID test on 07/17/20.  2) Medications:  YOU MAY TAKE ALL of your remaining medications with a small amount of water.  3) Must have a responsible person to drive you home.  4) Bring a current list of your medications and current insurance cards.    If you have any questions after you get home, please call the office at [phone]

## 2020-07-11 NOTE — Progress Notes (Signed)
Office Visit    Patient Name: Kristin James Date of Encounter: 07/11/2020  Primary Care Provider:  [name], MD Primary Cardiologist:  Ida Rogue, MD  Chief Complaint    64 year old female with a history of paroxysmal atrial fibrillation, left lower extremity embolism status post thrombolysis in September 2015, hypertension, hyperlipidemia, meningioma status post gamma knife (March 2018), and trigeminal neuralgia, who presents for follow-up related to atrial fibrillation.  Past Medical History    Past Medical History:  Diagnosis Date  . Embolus to lower extremity ([place])    a. 05/2014 s/p thrombolysis of LLE.  Marland Kitchen Hiatal hernia   . History of echocardiogram    a. 07/2020 Echo: EF 55-60%, no rwma, Nl RV size/fxn. Mildly dil LA. Mild MR/MS. Sev mitral annular Ca2+. Mild Ao sclerosis w/o stenosis.  Marland Kitchen History of stress test    a. 05/2014 lexiscan MV: EF 67%, no ischemia/infarct.  . Hypercholesteremia   . Hyperglycemia   . Hypertension   . Left Cerebellopontine Angle Meningioma ([place])    a. 10/2016 s/p Gamma knife.  . Osteoarthritis   . PAF (paroxysmal atrial fibrillation) ([place])    a. Dx 10/2014->converted on amio. CHA2DS2VASc = 3-4-->Eliquis.  . Proteinuria   . Spinal stenosis at L4-L5 level   . Thrombophlebitis of posterior tibial vein ([place]) 06/01/2014  . Trigeminal neuralgia 08/2016   Past Surgical History:  Procedure Laterality Date  . AORTOGRAM    . BACK SURGERY  11/13/2014  . CHOLECYSTECTOMY    . COLONOSCOPY WITH PROPOFOL N/A 08/15/2016   Procedure: COLONOSCOPY WITH PROPOFOL;  Surgeon: Lollie Sails, MD;  Location: [facility] ENDOSCOPY;  Service: Endoscopy;  Laterality: N/A;  . KNEE ARTHROSCOPY    . LOWER EXTREMITY ANGIOGRAM      Allergies  No Known Allergies  History of Present Illness    64 year old female with the above past medical history including paroxysmal atrial fibrillation, left lower extremity embolus status post thrombolysis in September 2015,  hypertension, hyperlipidemia, meningioma status post gamma knife (March 2018), and trigeminal neuralgia.  She previously underwent stress testing in September 2015, which was nonischemic and showed normal LV function.  She was incidentally found to have atrial fibrillation at the time of back surgery in March 2016.  She converted on amiodarone therapy and has been managed over the years with amiodarone, atenolol, and Eliquis.  She was last seen in cardiology clinic on October 21 of this year, at which time she was feeling well though she was noted to be tachycardic and in atrial fibrillation with a rate of 110 bpm.  She reported that heart rates typically run in the 60s when she checks her blood pressure at home.  She had had recent lab work showing elevation in creatinine to 1.8 on October 3, the follow-up labs October 21 showed a creatinine of 1.41 with a BUN of 40 (referred to nephrology by pcp).  As she reported good compliance with anticoagulation, I increased her amiodarone to 200 mg twice daily.  Echocardiography was also ordered and in the setting of report of snoring, she was referred to pulmonology for sleep evaluation.  Echo was performed yesterday and showed an EF of 55-60% without regional wall motion abnormalities.  Mild MR/MS were noted.  Ms. Kothari monitored her heart rates over the past 2 weeks and she has typically been trending in the low 100s with occasional dips into the 90s.  She has been noticing more dyspnea on exertion and easy fatigability as well.  She  has tolerated the higher dose of amiodarone well.  Unfortunately, she remains in A. fib today.  She denies chest pain, palpitations, PND, orthopnea, dizziness, syncope, or early satiety.  She does have some degree of chronic mild left lower extremity swelling.  Home Medications    Prior to Admission medications   Medication Sig Start Date End Date Taking? Authorizing Provider  amiodarone (PACERONE) 200 MG tablet Take 1 tablet (200 mg  total) by mouth 2 (two) times daily. 06/21/20   [name], NP  amitriptyline (ELAVIL) 25 MG tablet TAKE 1 TABLET(25 MG) BY MOUTH AT BEDTIME 06/07/20   Sater, [name], MD  amLODipine (NORVASC) 5 MG tablet TAKE 1 TABLET(5 MG) BY MOUTH TWICE DAILY 03/19/20   [name], MD  atenolol (TENORMIN) 25 MG tablet TAKE 1 TABLET(25 MG) BY MOUTH DAILY 01/19/20   Minna Merritts, MD  atorvastatin (LIPITOR) 40 MG tablet TAKE 1 TABLET BY MOUTH ONCE DAILY 06/07/20   Minna Merritts, MD  doxycycline (ADOXA) 100 MG tablet Take 1 tablet (100 mg total) by mouth 2 (two) times daily. 06/03/20   Nena Polio, MD  ELIQUIS 5 MG TABS tablet TAKE 1 TABLET BY MOUTH TWICE DAILY 03/12/20   Minna Merritts, MD  lamoTRIgine (LAMICTAL) 100 MG tablet TAKE 1 TABLET(100 MG) BY MOUTH TWICE DAILY 04/20/20   Sater, [name], MD  losartan-hydrochlorothiazide (HYZAAR) 100-12.5 MG tablet TAKE 1 TABLET BY MOUTH DAILY 03/16/20   [name], MD  nystatin cream (MYCOSTATIN) Apply 1 application topically 2 (two) times daily. 11/29/19   [name], MD  Oxcarbazepine (TRILEPTAL) 300 MG tablet TAKE 1 TABLET(300 MG) BY MOUTH TWICE DAILY 06/25/20   Sater, [name], MD   Family History    Family History  Problem Relation Age of Onset  . Hypertension Mother   . Stroke Father        3 strokes  . Hypertension Father   . Breast cancer Paternal Grandmother   . Colon cancer Neg Hx     Social History    Social History   Socioeconomic History  . Marital status: Married    Spouse name: Not on file  . Number of children: 2  . Years of education: Not on file  . Highest education level: Not on file  Occupational History  . Not on file  Tobacco Use  . Smoking status: Never Smoker  . Smokeless tobacco: Never Used  Vaping Use  . Vaping Use: Never used  Substance and Sexual Activity  . Alcohol use: Yes    Alcohol/week: 0.0 standard drinks    Comment: occ. wine  . Drug use: No  . Sexual activity: Not on  file  Other Topics Concern  . Not on file  Social History Narrative   Right handed    Caffeine use: coffee (1/2 cup per day)   Social Determinants of Health   Financial Resource Strain:   . Difficulty of Paying Living Expenses: Not on file  Food Insecurity:   . Worried About Charity fundraiser in the Last Year: Not on file  . Ran Out of Food in the Last Year: Not on file  Transportation Needs:   . Lack of Transportation (Medical): Not on file  . Lack of Transportation (Non-Medical): Not on file  Physical Activity:   . Days of Exercise per Week: Not on file  . Minutes of Exercise per Session: Not on file  Stress:   . Feeling of Stress : Not on  file  Social Connections:   . Frequency of Communication with Friends and Family: Not on file  . Frequency of Social Gatherings with Friends and Family: Not on file  . Attends Religious Services: Not on file  . Active Member of Clubs or Organizations: Not on file  . Attends Archivist Meetings: Not on file  . Marital Status: Not on file  Intimate Partner Violence:   . Fear of Current or Ex-Partner: Not on file  . Emotionally Abused: Not on file  . Physically Abused: Not on file  . Sexually Abused: Not on file    Review of Systems    In the setting of atrial fibrillation, over the past few weeks, she has noticed an increase in dyspnea on exertion and easy fatigability.  She has some degree of chronic, mild, left lower extremity swelling.  She denies chest pain, palpitations, PND, orthopnea, dizziness, syncope, or early satiety.  All other systems reviewed and are otherwise negative except as noted above.  Physical Exam    VS:  BP (!) 162/100 (BP Location: Left Arm, Patient Position: Sitting, Cuff Size: Normal)   Pulse (!) 103   Ht 5' (1.524 m)   Wt 206 lb (93.4 kg)   SpO2 96%   BMI 40.23 kg/m  , BMI Body mass index is 40.23 kg/m. GEN: Well nourished, well developed, in no acute distress. HEENT: normal. Neck: Supple,  no JVD, carotid bruits, or masses. Cardiac: Irregularly irregular, no murmurs, rubs, or gallops. No clubbing, cyanosis.  1+ left ankle edema.  Radials/PT 2+ and equal bilaterally.  Respiratory:  Respirations regular and unlabored, clear to auscultation bilaterally. GI: Soft, nontender, nondistended, BS + x 4. MS: no deformity or atrophy. Skin: warm and dry, no rash. Neuro:  Strength and sensation are intact. Psych: Normal affect.  Accessory Clinical Findings    ECG personally reviewed by me today -atrial fibrillation, 103, prior anteroseptal infarct - no acute changes.  Lab Results  Component Value Date   WBC 18.0 (H) 06/03/2020   HGB 13.7 06/03/2020   HCT 38.8 06/03/2020   MCV 90.7 06/03/2020   PLT 243 06/03/2020   Lab Results  Component Value Date   CREATININE 1.41 (H) 06/21/2020   BUN 40 (H) 06/21/2020   NA 143 06/21/2020   K 4.9 06/21/2020   CL 106 06/21/2020   CO2 21 06/21/2020   Lab Results  Component Value Date   ALT 26 06/03/2020   AST 26 06/03/2020   GGT 56 09/08/2019   ALKPHOS 117 06/03/2020   BILITOT 1.1 06/03/2020   Lab Results  Component Value Date   CHOL 168 03/30/2020   HDL 49.50 03/30/2020   LDLCALC 97 03/30/2020   TRIG 110.0 03/30/2020   CHOLHDL 3 03/30/2020    Lab Results  Component Value Date   HGBA1C 6.1 03/30/2020   Lab Results  Component Value Date   TSH 2.910 06/21/2020     Assessment & Plan    1.  Paroxysmal atrial fibrillation: Patient was noted to be back in A. fib at routine follow-up on October 21.  She was completely unaware of this.  I increase her amiodarone to 2 mg twice daily at that time.  She has monitored her heart rates over the past 2 weeks and she has mostly been trending in the low 100s with occasional dips into the 90s.  She has noticed an increase in fatigability and dyspnea on exertion.  She continues to deny palpitations though she remains in  A. fib today at 103.  Lab work previously unremarkable for reversible  causes.  Echo shows normal LV function without significant wall motion abnormalities.  Left atrium is mildly dilated.  She has been compliant with amiodarone and Eliquis.  I will increase her atenolol to 25 mg twice daily today.  We discussed the low risk nature of direct current cardioversion today with risks including VF due to general anesthesia or lack of synchronization between the DC shock and the QRS complex, thromboembolus due to insufficient anticoagulant therapy, non-sustained VT, atrial arrhythmia, heart block, bradycardia, transient left bundle branch block, myocardial necrosis, myocardial dysfunction, transient hypotension, pulmonary edema, and skin burn and she is willing to proceed.  She will be Covid tested next week and plan for cardioversion next Thursday.  2.  Essential hypertension: Blood pressure elevated today though home trends show more normal pressures.  Increasing atenolol in the setting of elevated heart rates.  She will continue to follow blood pressure at home.  3.  Hyperlipidemia: LDL 97 on statin therapy.  4.  History of left lower extremity embolus: On chronic Eliquis with chronic, mild left ankle swelling.  5.  CKD 3: Creatinine elevated since April of this year and was most recently 1.41 on October 21.  Following up labs today in the setting of pending cardioversion.  6.  Snoring: Pulmonary referral made at last visit for sleep eval.  7.  Disposition: Follow-up labs today with Covid testing next week.  Plan for direct-current cardioversion next Thursday.  Murray Hodgkins, NP 07/11/2020, 1:32 PM

## 2020-07-12 LAB — CBC WITH DIFFERENTIAL/PLATELET
Basophils Absolute: 0.1 10*3/uL (ref 0.0–0.2)
Basos: 1 %
EOS (ABSOLUTE): 0.2 10*3/uL (ref 0.0–0.4)
Eos: 2 %
Hematocrit: 45.8 % (ref 34.0–46.6)
Hemoglobin: 15 g/dL (ref 11.1–15.9)
Immature Grans (Abs): 0 10*3/uL (ref 0.0–0.1)
Immature Granulocytes: 1 %
Lymphocytes Absolute: 1.9 10*3/uL (ref 0.7–3.1)
Lymphs: 24 %
MCH: 29.9 pg (ref 26.6–33.0)
MCHC: 32.8 g/dL (ref 31.5–35.7)
MCV: 91 fL (ref 79–97)
Monocytes Absolute: 0.7 10*3/uL (ref 0.1–0.9)
Monocytes: 9 %
Neutrophils Absolute: 5.1 10*3/uL (ref 1.4–7.0)
Neutrophils: 63 %
Platelets: 252 10*3/uL (ref 150–450)
RBC: 5.01 x10E6/uL (ref 3.77–5.28)
RDW: 13 % (ref 11.7–15.4)
WBC: 8 10*3/uL (ref 3.4–10.8)

## 2020-07-12 LAB — BASIC METABOLIC PANEL
BUN/Creatinine Ratio: 20 (ref 12–28)
BUN: 28 mg/dL — ABNORMAL HIGH (ref 8–27)
CO2: 23 mmol/L (ref 20–29)
Calcium: 9.5 mg/dL (ref 8.7–10.3)
Chloride: 100 mmol/L (ref 96–106)
Creatinine, Ser: 1.39 mg/dL — ABNORMAL HIGH (ref 0.57–1.00)
GFR calc Af Amer: 46 mL/min/{1.73_m2} — ABNORMAL LOW (ref >59)
GFR calc non Af Amer: 40 mL/min/{1.73_m2} — ABNORMAL LOW (ref >59)
Glucose: 99 mg/dL (ref 65–99)
Potassium: 4.5 mmol/L (ref 3.5–5.2)
Sodium: 139 mmol/L (ref 134–144)

## 2020-07-17 ENCOUNTER — Other Ambulatory Visit: Payer: Self-pay

## 2020-07-17 ENCOUNTER — Other Ambulatory Visit
Admission: RE | Admit: 2020-07-17 | Discharge: 2020-07-17 | Disposition: A | Discharge status: Home / Self Care | Payer: PPO | Source: Ambulatory Visit | Attending: Nurse Practitioner | Admitting: Nurse Practitioner

## 2020-07-17 DIAGNOSIS — Z20822 Contact with and (suspected) exposure to covid-19: Secondary | ICD-10-CM | POA: Insufficient documentation

## 2020-07-17 DIAGNOSIS — Z01812 Encounter for preprocedural laboratory examination: Secondary | ICD-10-CM | POA: Diagnosis not present

## 2020-07-17 LAB — SARS CORONAVIRUS 2 (TAT 6-24 HRS): SARS Coronavirus 2: NEGATIVE

## 2020-07-19 ENCOUNTER — Ambulatory Visit: Payer: PPO | Admitting: Anesthesiology

## 2020-07-19 ENCOUNTER — Encounter
Admission: RE | Disposition: A | Discharge status: Home / Self Care | Payer: Self-pay | Source: Home / Self Care | Attending: Cardiovascular Disease

## 2020-07-19 ENCOUNTER — Other Ambulatory Visit: Payer: Self-pay

## 2020-07-19 ENCOUNTER — Ambulatory Visit
Admission: RE | Admit: 2020-07-19 | Discharge: 2020-07-19 | Disposition: A | Discharge status: Home / Self Care | Payer: PPO | Attending: Cardiovascular Disease | Admitting: Cardiovascular Disease

## 2020-07-19 ENCOUNTER — Encounter: Payer: Self-pay | Admitting: Cardiovascular Disease

## 2020-07-19 DIAGNOSIS — Z79899 Other long term (current) drug therapy: Secondary | ICD-10-CM | POA: Insufficient documentation

## 2020-07-19 DIAGNOSIS — N183 Chronic kidney disease, stage 3 unspecified: Secondary | ICD-10-CM | POA: Diagnosis not present

## 2020-07-19 DIAGNOSIS — Z7901 Long term (current) use of anticoagulants: Secondary | ICD-10-CM | POA: Insufficient documentation

## 2020-07-19 DIAGNOSIS — R0609 Other forms of dyspnea: Secondary | ICD-10-CM | POA: Diagnosis not present

## 2020-07-19 DIAGNOSIS — E785 Hyperlipidemia, unspecified: Secondary | ICD-10-CM | POA: Insufficient documentation

## 2020-07-19 DIAGNOSIS — I480 Paroxysmal atrial fibrillation: Secondary | ICD-10-CM | POA: Insufficient documentation

## 2020-07-19 DIAGNOSIS — I129 Hypertensive chronic kidney disease with stage 1 through stage 4 chronic kidney disease, or unspecified chronic kidney disease: Secondary | ICD-10-CM | POA: Insufficient documentation

## 2020-07-19 DIAGNOSIS — E7800 Pure hypercholesterolemia, unspecified: Secondary | ICD-10-CM | POA: Diagnosis not present

## 2020-07-19 DIAGNOSIS — I4891 Unspecified atrial fibrillation: Secondary | ICD-10-CM | POA: Diagnosis not present

## 2020-07-19 DIAGNOSIS — I4819 Other persistent atrial fibrillation: Secondary | ICD-10-CM | POA: Diagnosis not present

## 2020-07-19 DIAGNOSIS — R0683 Snoring: Secondary | ICD-10-CM | POA: Insufficient documentation

## 2020-07-19 HISTORY — PX: CARDIOVERSION: SHX1299

## 2020-07-19 SURGERY — CARDIOVERSION
Anesthesia: General

## 2020-07-19 MED ORDER — PROPOFOL 10 MG/ML IV BOLUS
INTRAVENOUS | Status: DC | PRN
  Administered 2020-07-19: 60 mg via INTRAVENOUS

## 2020-07-19 MED ORDER — SODIUM CHLORIDE 0.9 % IV SOLN: INTRAVENOUS | Status: DC

## 2020-07-19 NOTE — Progress Notes (Signed)
Patient tolerated DCCV without event. Remains SB, EKG obtained.  Patient without c/o's.  Tolerated 124ml water, did not want anything to eat.  Husband at bedside, Dr. Rockey Situ spoke to patient and patient's husband, no changes in medications, has f/u appt scheduled.

## 2020-07-19 NOTE — CV Procedure (Signed)
Cardioversion procedure note For atrial fibrillation, persistent  Procedure Details:  Consent: Risks of procedure as well as the alternatives and risks of each were explained to the (patient/caregiver).  Consent for procedure obtained.  Time Out: Verified patient identification, verified procedure, site/side was marked, verified correct patient position, special equipment/implants available, medications/allergies/relevent history reviewed, required imaging and test results available.  Performed  Patient placed on cardiac monitor, pulse oximetry, supplemental oxygen as necessary.   Sedation given: propofol IV, Dr. Piscitello Pacer pads placed anterior and posterior chest.   Cardioverted 1 time(s).   Cardioverted at 150 J. Synchronized biphasic Converted to NSR   Evaluation: Findings: Post procedure EKG shows: NSR Complications: None Patient did tolerate procedure well.  Time Spent Directly with the Patient:  45 minutes   Tim Charae Depaolis, M.D., Ph.D.  

## 2020-07-19 NOTE — Transfer of Care (Signed)
Immediate Anesthesia Transfer of Care Note  Patient: Kristin James  Procedure(s) Performed: CARDIOVERSION (N/A )  Patient Location: PACU  Anesthesia Type:General  Level of Consciousness: sedated  Airway & Oxygen Therapy: Patient Spontanous Breathing and Patient connected to nasal cannula oxygen  Post-op Assessment: Report given to RN and Post -op Vital signs reviewed and stable  Post vital signs: Reviewed and stable  Last Vitals:  Vitals Value Taken Time  BP 93/50 07/19/20 0744  Temp    Pulse 52 07/19/20 0745  Resp 14 07/19/20 0745  SpO2 96 % 07/19/20 0745  Vitals shown include unvalidated device data.  Last Pain:  Vitals:   07/19/20 0744  TempSrc:   PainSc: 0-No pain         Complications: No complications documented.

## 2020-07-19 NOTE — H&P (Signed)
H&P Addendum, pre-cardioversion  Patient was seen and evaluated prior to -cardioversion procedure Symptoms, prior testing details again confirmed with the patient Patient examined, no significant change from prior exam Lab work reviewed in detail personally by myself Patient understands risk and benefit of the procedure, willing to proceed  Signed, Tim Rakesh Dutko, MD, Ph.D [facility]  

## 2020-07-19 NOTE — Anesthesia Preprocedure Evaluation (Signed)
Anesthesia Evaluation  Patient identified by MRN, date of birth, ID band Patient awake    Reviewed: Allergy & Precautions, H&P , NPO status , Patient's Chart, lab work & pertinent test results  History of Anesthesia Complications Negative for: history of anesthetic complications  Airway Mallampati: III  TM Distance: <3 FB Neck ROM: full    Dental  (+) Chipped   Pulmonary neg pulmonary ROS, neg shortness of breath,    Pulmonary exam normal        Cardiovascular Exercise Tolerance: Good hypertension, (-) angina+ Peripheral Vascular Disease  (-) Past MI  Rhythm:irregular     Neuro/Psych  Neuromuscular disease negative psych ROS   GI/Hepatic negative GI ROS, Neg liver ROS, hiatal hernia, GERD  Medicated and Controlled,  Endo/Other  negative endocrine ROS  Renal/GU Renal diseasenegative Renal ROS  negative genitourinary   Musculoskeletal   Abdominal   Peds  Hematology negative hematology ROS (+)   Anesthesia Other Findings Past Medical History: No date: Embolus to lower extremity ([place])     Comment:  a. 05/2014 s/p thrombolysis of LLE. No date: Hiatal hernia No date: History of echocardiogram     Comment:  a. 07/2020 Echo: EF 55-60%, no rwma, Nl RV size/fxn.               Mildly dil LA. Mild MR/MS. Sev mitral annular Ca2+. Mild               Ao sclerosis w/o stenosis. No date: History of stress test     Comment:  a. 05/2014 lexiscan MV: EF 67%, no ischemia/infarct. No date: Hypercholesteremia No date: Hyperglycemia No date: Hypertension No date: Left Cerebellopontine Angle Meningioma ([place])     Comment:  a. 10/2016 s/p Gamma knife. No date: Osteoarthritis No date: PAF (paroxysmal atrial fibrillation) ([facility])     Comment:  a. Dx 10/2014->converted on amio. CHA2DS2VASc =               3-4-->Eliquis. No date: Proteinuria No date: Spinal stenosis at L4-L5 level 06/01/2014: Thrombophlebitis of posterior tibial vein  ([place]) 08/2016: Trigeminal neuralgia  Past Surgical History: No date: AORTOGRAM 11/13/2014: BACK SURGERY No date: CHOLECYSTECTOMY 08/15/2016: COLONOSCOPY WITH PROPOFOL; N/A     Comment:  Procedure: COLONOSCOPY WITH PROPOFOL;  Surgeon: Lollie Sails, MD;  Location: [facility] ENDOSCOPY;  Service:               Endoscopy;  Laterality: N/A; No date: KNEE ARTHROSCOPY No date: LOWER EXTREMITY ANGIOGRAM  BMI    Body Mass Index: 39.06 kg/m      Reproductive/Obstetrics negative OB ROS                             Anesthesia Physical Anesthesia Plan  ASA: III  Anesthesia Plan: General   Post-op Pain Management:    Induction: Intravenous  PONV Risk Score and Plan: Propofol infusion and TIVA  Airway Management Planned: Natural Airway and Nasal Cannula  Additional Equipment:   Intra-op Plan:   Post-operative Plan:   Informed Consent: I have reviewed the patients History and Physical, chart, labs and discussed the procedure including the risks, benefits and alternatives for the proposed anesthesia with the patient or authorized representative who has indicated his/her understanding and acceptance.     Dental Advisory Given  Plan Discussed with: Anesthesiologist, CRNA and Surgeon  Anesthesia Plan Comments: (Patient consented  for risks of anesthesia including but not limited to:  - adverse reactions to medications - risk of intubation if required - damage to eyes, teeth, lips or other oral mucosa - nerve damage due to positioning  - sore throat or hoarseness - Damage to heart, brain, nerves, lungs, other parts of body or loss of life  Patient voiced understanding.)        Anesthesia Quick Evaluation

## 2020-07-19 NOTE — Interval H&P Note (Signed)
History and Physical Interval Note:  07/19/2020 7:44 PM  Kristin James  has presented today for surgery, with the diagnosis of Cardioversion   AFib.  The various methods of treatment have been discussed with the patient and family. After consideration of risks, benefits and other options for treatment, the patient has consented to  Procedure(s): CARDIOVERSION (N/A) as a surgical intervention.  The patient's history has been reviewed, patient examined, no change in status, stable for surgery.  I have reviewed the patient's chart and labs.  Questions were answered to the patient's satisfaction.     Ida Rogue

## 2020-07-20 ENCOUNTER — Encounter: Payer: Self-pay | Admitting: Cardiovascular Disease

## 2020-07-20 NOTE — Anesthesia Postprocedure Evaluation (Signed)
Anesthesia Post Note  Patient: HAILEYANN STAIGER  Procedure(s) Performed: CARDIOVERSION (N/A )  Patient location during evaluation: Cath Lab Anesthesia Type: General Level of consciousness: awake and alert Pain management: pain level controlled Vital Signs Assessment: post-procedure vital signs reviewed and stable Respiratory status: spontaneous breathing, nonlabored ventilation, respiratory function stable and patient connected to nasal cannula oxygen Cardiovascular status: blood pressure returned to baseline and stable Postop Assessment: no apparent nausea or vomiting Anesthetic complications: no   No complications documented.   Last Vitals:  Vitals:   07/19/20 0800 07/19/20 0815  BP: 110/69 112/74  Pulse: (!) 51 (!) 54  Resp: 14 14  Temp:    SpO2: 97% 97%    Last Pain:  Vitals:   07/19/20 0815  TempSrc:   PainSc: 0-No pain                 Precious Haws Antania Hoefling

## 2020-08-03 ENCOUNTER — Encounter: Payer: Self-pay | Admitting: Nurse Practitioner

## 2020-08-03 ENCOUNTER — Ambulatory Visit: Payer: PPO | Admitting: Nurse Practitioner

## 2020-08-03 ENCOUNTER — Other Ambulatory Visit: Payer: Self-pay

## 2020-08-03 VITALS — BP 132/78 | HR 59 | Ht 60.0 in | Wt 207.0 lb

## 2020-08-03 DIAGNOSIS — I10 Essential (primary) hypertension: Secondary | ICD-10-CM

## 2020-08-03 DIAGNOSIS — N183 Chronic kidney disease, stage 3 unspecified: Secondary | ICD-10-CM

## 2020-08-03 DIAGNOSIS — E782 Mixed hyperlipidemia: Secondary | ICD-10-CM

## 2020-08-03 DIAGNOSIS — R0683 Snoring: Secondary | ICD-10-CM | POA: Diagnosis not present

## 2020-08-03 DIAGNOSIS — I480 Paroxysmal atrial fibrillation: Secondary | ICD-10-CM | POA: Diagnosis not present

## 2020-08-03 NOTE — Patient Instructions (Signed)
Medication Instructions:  Your physician recommends that you continue on your current medications as directed. Please refer to the Current Medication list given to you today.   *If you need a refill on your cardiac medications before your next appointment, please call your pharmacy*   Lab Work: None ordered today  If you have labs (blood work) drawn today and your tests are completely normal, you will receive your results only by:  [place] (if you have MyChart) OR  A paper copy in the mail If you have any lab test that is abnormal or we need to change your treatment, we will call you to review the results.   Testing/Procedures: None ordered   Follow-Up: At [facility], you and your health needs are our priority.  As part of our continuing mission to provide you with exceptional heart care, we have created designated Provider Care Teams.  These Care Teams include your primary Cardiologist (physician) and Advanced Practice Providers (APPs -  Physician Assistants and Nurse Practitioners) who all work together to provide you with the care you need, when you need it.  We recommend signing up for the patient portal called "MyChart".  Sign up information is provided on this After Visit Summary.  MyChart is used to connect with patients for Virtual Visits (Telemedicine).  Patients are able to view lab/test results, encounter notes, upcoming appointments, etc.  Non-urgent messages can be sent to your provider as well.   To learn more about what you can do with MyChart, go to [email or web address].    Your next appointment:   3 month(s)  The format for your next appointment:   In Person  Provider:   You may see Ida Rogue, MD or one of the following Advanced Practice Providers on your designated Care Team:    Murray Hodgkins, NP  Christell Faith, PA-C  Marrianne Mood, PA-C  Cadence [place], [place]  Laurann [place], NP

## 2020-08-03 NOTE — Progress Notes (Signed)
Office Visit    Patient Name: Kristin James Date of Encounter: 08/03/2020  Primary Care Provider:  [name], MD Primary Cardiologist:  Ida Rogue, MD  Chief Complaint    64 year-old female with a history of paroxysmal atrial fibrillation s/p DCCV (07/19/2020), left lower extremity embolism s/p thrombolysis (05/2014), hypertension, hyperlipidemia, meningioma s/p gamma knife (10/2016), and trigeminal neuralgia, who presents for follow-up related to atrial fibrillation.   Past Medical History    Past Medical History:  Diagnosis Date  . Embolus to lower extremity ([place])    a. 05/2014 s/p thrombolysis of LLE.  Marland Kitchen Hiatal hernia   . History of echocardiogram    a. 07/2020 Echo: EF 55-60%, no rwma, Nl RV size/fxn. Mildly dil LA. Mild MR/MS. Sev mitral annular Ca2+. Mild Ao sclerosis w/o stenosis.  Marland Kitchen History of stress test    a. 05/2014 lexiscan MV: EF 67%, no ischemia/infarct.  . Hypercholesteremia   . Hyperglycemia   . Hypertension   . Left Cerebellopontine Angle Meningioma ([place])    a. 10/2016 s/p Gamma knife.  . Osteoarthritis   . PAF (paroxysmal atrial fibrillation) ([place])    a. Dx 10/2014->converted on amio. CHA2DS2VASc = 3-4-->Eliquis.  . Proteinuria   . Spinal stenosis at L4-L5 level   . Thrombophlebitis of posterior tibial vein ([place]) 06/01/2014  . Trigeminal neuralgia 08/2016   Past Surgical History:  Procedure Laterality Date  . AORTOGRAM    . BACK SURGERY  11/13/2014  . CARDIOVERSION N/A 07/19/2020   Procedure: CARDIOVERSION;  Surgeon: Minna Merritts, MD;  Location: [facility] ORS;  Service: Cardiovascular;  Laterality: N/A;  . CHOLECYSTECTOMY    . COLONOSCOPY WITH PROPOFOL N/A 08/15/2016   Procedure: COLONOSCOPY WITH PROPOFOL;  Surgeon: Lollie Sails, MD;  Location: [facility] ENDOSCOPY;  Service: Endoscopy;  Laterality: N/A;  . KNEE ARTHROSCOPY    . LOWER EXTREMITY ANGIOGRAM      Allergies  No Known Allergies  History of Present Illness    64 year-old female  with the above past medical history including paroxysmal atrial fibrillation s/p DCCV (07/19/2020) on Eliquis (CHA2DS2VASc of 5-HTN, age, sex, hx LE embolism), left lower extremity embolism s/p thrombolysis (05/2014), hypertension, hyperlipidemia, meningioma s/p gamma knife (10/2016), and trigeminal neuralgia. She previously underwent stress testing in September 2015, which was nonischemic and showed normal LV function. She was incidentally found to have atrial fibrillation at the time of back surgery in March 2016. She converted on amiodarone therapy and had been managed over the years with amiodarone, atenolol, and Eliquis. At a follow-up clinic visit in October 2021, she was found to be in atrial fibrillation with a rate of 110 bpm. Amiodarone was increased to 200 mg daily. Echo showed and EF of 55-60 % without regional wall motion abnormalities, and mild MR/MS. Additionally, her lab work at the time showed a creatinine of 1.8> 1.41, BUN of 40; she was referred to nephrology by per PCP. She was also referred to pulmonology for sleep evaluation in the setting of reported snoring.   At her follow-up visit on 07/11/2020 she reported heart rates in the low 100s with increased dyspnea on exertion and easy fatigability. EKG once again showed a fib. Atenolol was increased to 25 mg twice daily and she was scheduled for an outpatient cardioversion in the setting of recurrent atrial fibrillation and adherence to amiodarone and Eliquis. She underwent DCCV on 07/19/2020, with restoration of sinus rhythm.   Since her procedure she has done well. She is walking 30  minutes a day, 3 days a week, and has no complaints.  She checks her blood pressure twice daily at home and has noticed some low readings (78/60). However, she is asymptomatic and BP returns to 120s/80s with immediate recheck. She denies chest pain, palpitations, dyspnea, pnd, orthopnea, n, v, dizziness, syncope, edema, weight gain, or early satiety.  Home  Medications    Prior to Admission medications   Medication Sig Start Date End Date Taking? Authorizing Provider  amiodarone (PACERONE) 200 MG tablet Take 1 tablet (200 mg total) by mouth 2 (two) times daily. 06/21/20   [name], NP  amitriptyline (ELAVIL) 25 MG tablet TAKE 1 TABLET(25 MG) BY MOUTH AT BEDTIME Patient taking differently: Take 25 mg by mouth at bedtime.  06/07/20   Sater, [name], MD  amLODipine (NORVASC) 5 MG tablet TAKE 1 TABLET(5 MG) BY MOUTH TWICE DAILY Patient taking differently: Take 5 mg by mouth 2 (two) times daily.  03/19/20   [name], MD  atenolol (TENORMIN) 25 MG tablet Take 1 tablet (25 mg total) by mouth 2 (two) times daily. 07/11/20   [name], NP  atorvastatin (LIPITOR) 40 MG tablet TAKE 1 TABLET BY MOUTH ONCE DAILY Patient taking differently: Take 40 mg by mouth daily.  06/07/20   Minna Merritts, MD  doxycycline (ADOXA) 100 MG tablet Take 1 tablet (100 mg total) by mouth 2 (two) times daily. Patient not taking: Reported on 07/16/2020 06/03/20   Nena Polio, MD  ELIQUIS 5 MG TABS tablet TAKE 1 TABLET BY MOUTH TWICE DAILY Patient taking differently: Take 5 mg by mouth 2 (two) times daily.  03/12/20   Minna Merritts, MD  lamoTRIgine (LAMICTAL) 100 MG tablet TAKE 1 TABLET(100 MG) BY MOUTH TWICE DAILY Patient taking differently: Take 100 mg by mouth 2 (two) times daily.  04/20/20   Sater, [name], MD  losartan-hydrochlorothiazide (HYZAAR) 100-12.5 MG tablet TAKE 1 TABLET BY MOUTH DAILY 03/16/20   [name], MD  nystatin cream (MYCOSTATIN) Apply 1 application topically 2 (two) times daily. Patient not taking: Reported on 07/16/2020 11/29/19   [name], MD  Oxcarbazepine (TRILEPTAL) 300 MG tablet TAKE 1 TABLET(300 MG) BY MOUTH TWICE DAILY Patient taking differently: Take 300 mg by mouth 2 (two) times daily.  06/25/20   Sater, [name], MD    Review of Systems    She denies chest pain, palpitations,  dyspnea, pnd, orthopnea, n, v, dizziness, syncope, edema, weight gain, or early satiety. All other systems reviewed and are otherwise negative except as noted above.  Physical Exam    VS:  BP 132/78 (BP Location: Left Arm, Patient Position: Sitting, Cuff Size: Normal)   Pulse (!) 59   Ht 5' (1.524 m)   Wt 207 lb (93.9 kg)   SpO2 96%   BMI 40.43 kg/m   GEN: Well nourished, well developed, in no acute distress. HEENT: normal. Neck: Supple, no JVD, carotid bruits, or masses. Cardiac: RRR, no murmurs, rubs, or gallops. No clubbing, cyanosis, edema.  PT 1+ and equal bilaterally.  Respiratory:  Respirations regular and unlabored, clear to auscultation bilaterally. GI: Soft, nontender, nondistended, BS + x 4. MS: no deformity or atrophy. Skin: warm and dry, no rash. Neuro:  Strength and sensation are intact. Psych: Normal affect.  Accessory Clinical Findings    ECG personally reviewed by me today -Sinus bradycardia, 59, ? Septal infarct - no acute changes.  Lab Results  Component Value Date   WBC 8.0 07/11/2020  HGB 15.0 07/11/2020   HCT 45.8 07/11/2020   MCV 91 07/11/2020   PLT 252 07/11/2020   Lab Results  Component Value Date   CREATININE 1.39 (H) 07/11/2020   BUN 28 (H) 07/11/2020   NA 139 07/11/2020   K 4.5 07/11/2020   CL 100 07/11/2020   CO2 23 07/11/2020   Lab Results  Component Value Date   ALT 26 06/03/2020   AST 26 06/03/2020   GGT 56 09/08/2019   ALKPHOS 117 06/03/2020   BILITOT 1.1 06/03/2020   Lab Results  Component Value Date   CHOL 168 03/30/2020   HDL 49.50 03/30/2020   LDLCALC 97 03/30/2020   TRIG 110.0 03/30/2020   CHOLHDL 3 03/30/2020    Lab Results  Component Value Date   HGBA1C 6.1 03/30/2020    Assessment & Plan    1.  Paroxysmal atrial fibrillation: Now s/p DCCV on 07/19/2020 and maintaining sinus rhythm. She feels well and is no longer experiencing fatigue. Given development of recurrent Afib when on amio 200 daily and high risk of  recurrent a fib, will continue amiodarone 200 mg twice daily for now, atenolol 25 mg twice daily, and eliquis 5 mg twice daily. Repeat LFTs, TFTs at next visit. Follow-up in 3 months or sooner if needed.   2.  Essential hypertension: Blood pressure 132/78 today. Continue amlodipine, losartan-HCTZ, and atenolol. She will continue to monitor her BP at home.   3. Hyperlipidemia: LDL 97 on statin therapy.   4. History of left lower extremity embolus: On chronic eliquis.   5. CKD 3: Creatnine elevated since April. Most recent creatinine 1.39 on 07/11/2020. She has an appointment with nephrology in January 2022. Consider repeat BMET at next visit.   6. Snoring: She is awaiting sleep eval per pulmonary.   7. Disposition: Continue current medications.  Repeat LFTs, TFTs, and BMET at next visit. Follow-up in clinic in 3 months or sooner if needed.    Murray Hodgkins, NP 08/03/2020, 7:43 AM

## 2020-08-22 DIAGNOSIS — B029 Zoster without complications: Secondary | ICD-10-CM | POA: Diagnosis not present

## 2020-09-05 ENCOUNTER — Other Ambulatory Visit: Payer: Self-pay | Admitting: Internal Medicine

## 2020-09-06 ENCOUNTER — Other Ambulatory Visit: Payer: Self-pay | Admitting: Internal Medicine

## 2020-09-06 ENCOUNTER — Other Ambulatory Visit: Payer: Self-pay | Admitting: Cardiovascular Disease

## 2020-09-06 NOTE — Telephone Encounter (Signed)
Pt last saw Ward Givens, [place] on 08/03/20, last labs 07/11/20 Creat 1.39, age 65, weight 93.9kg, based on specified criteria pt is on appropriate dosage of Eliquis 5mg  BID.  Will refill rx.

## 2020-09-07 ENCOUNTER — Other Ambulatory Visit: Payer: Self-pay | Admitting: Cardiovascular Disease

## 2020-09-07 ENCOUNTER — Telehealth: Payer: Self-pay

## 2020-09-07 NOTE — Telephone Encounter (Signed)
Patient called back. Advised her that Xarelto is now preferred on her plan, but that we have submitted PA to keep her on Eliquis.  She pays about $45 per month for Eliquis. Will get her copay card once PA approved.

## 2020-09-07 NOTE — Telephone Encounter (Signed)
I have submitted the prior authorization. If denied, we have an appeals letter written and ready to send.  I have called and LVM on pt machine to call back. Will let her know that a PA was submitted for Eliquis since plan prefers Xarelto. Will also confirm that patient has been using copay card.

## 2020-09-07 NOTE — Telephone Encounter (Signed)
Per [email or web address]:  The patient's drug benefit plan provides coverage for other drugs which may be considered for treating your patient. Can your patient's treatment be switched to a formulary drug? [If yes, then provide your patient with a new prescription for the preferred product.] Available Formulary Alternatives: Xarelto  Please advise on whether to continue with Prior Authorization, send to the lipid clinic in [place], or change medication. Thank you.

## 2020-09-09 ENCOUNTER — Other Ambulatory Visit: Payer: Self-pay | Admitting: Cardiovascular Disease

## 2020-09-10 NOTE — Telephone Encounter (Signed)
PA denied- appeals faxed 1/10

## 2020-09-10 NOTE — Telephone Encounter (Signed)
Rx request sent to pharmacy.  

## 2020-09-14 NOTE — Telephone Encounter (Signed)
Please advise on status of appeal, Thank you.

## 2020-09-14 NOTE — Telephone Encounter (Signed)
Patient calling to check status

## 2020-09-14 NOTE — Telephone Encounter (Signed)
Called pt. LVM for her to call back. Appeals letter was sent on 1/10. Waiting to hear back.

## 2020-09-18 NOTE — Telephone Encounter (Signed)
Patient calling to check on status.

## 2020-09-18 NOTE — Telephone Encounter (Signed)
Spoke with patient and advised that we were waiting to hear back from insurance about appeal. Patient states she doesn't have the [employer] plan anymore. She has [employer] medicare. I advised that she can fill her Eliquis through [employer] without any issue. Advised she call [employer] to make sure she is not paying for that plan anymore since it never told us she wasn't covered.

## 2020-09-18 NOTE — Telephone Encounter (Signed)
Patient called checking on appeal.

## 2020-09-20 ENCOUNTER — Telehealth: Payer: Self-pay

## 2020-09-20 NOTE — Telephone Encounter (Signed)
Pt said she was diagnosed with shingles last month. She tried to get in with [place] but we were full. So she went to a dr on [address] and they gave her medicine for the shingles. She said they are still itching and she wants to know what she can take or if Dr. Nicki Reaper can prescribe her something for it?

## 2020-09-20 NOTE — Telephone Encounter (Signed)
Pt declined virtual visit. She has some OTC stuff she is going to try first.

## 2020-09-20 NOTE — Telephone Encounter (Signed)
Patient called back to let Dr. Nicki Reaper know she went to [facility] on [facility] [address] [facility] for her shingles.

## 2020-09-20 NOTE — Telephone Encounter (Signed)
I can see her tomorrow for virtual visit and discuss symptoms, treatment, etc.

## 2020-09-21 ENCOUNTER — Telehealth (INDEPENDENT_AMBULATORY_CARE_PROVIDER_SITE_OTHER): Payer: PPO | Admitting: Internal Medicine

## 2020-09-21 ENCOUNTER — Other Ambulatory Visit: Payer: Self-pay

## 2020-09-21 DIAGNOSIS — B029 Zoster without complications: Secondary | ICD-10-CM

## 2020-09-21 MED ORDER — HYDROXYZINE HCL 10 MG PO TABS: 10.0000 mg | ORAL_TABLET | Freq: Every day | ORAL | 0 refills | Status: DC | PRN

## 2020-09-21 MED ORDER — TRIAMCINOLONE ACETONIDE 0.1 % EX CREA: 1.0000 "application " | TOPICAL_CREAM | Freq: Two times a day (BID) | CUTANEOUS | 0 refills | Status: DC

## 2020-09-21 NOTE — Progress Notes (Signed)
Virtual Visit via telephone Note  This visit type was conducted due to national recommendations for restrictions regarding the COVID-19 pandemic (e.g. social distancing).  This format is felt to be most appropriate for this patient at this time.  All issues noted in this document were discussed and addressed.  No physical exam was performed (except for noted visual exam findings with Video Visits).   I connected with Kristin James by telephone and verified that I am speaking with the correct person using two identifiers. Location patient: home Location provider: home office Persons participating in the telephone visit: patient, provider  The limitations, risks, security and privacy concerns of performing an evaluation and management service by telephone and the availability of in person appointments have been discussed.  It has also been discussed with the patient that there may be a patient responsible charge related to this service. The patient expressed understanding and agreed to proceed.   Reason for visit: work in appt  HPI: Recently diagnosed with shingles - week before Christmas. Treated with anti viral and prednisone.  The rash from shingles has improved. No open lesions.  No fever.  Has been having increased itching - back to left breast.  Persistent. Has tried hydrocortisone cream, benadryl , etc.  Breathing stable.     ROS: See pertinent positives and negatives per HPI.  Past Medical History:  Diagnosis Date  . Embolus to lower extremity ([place])    a. 05/2014 s/p thrombolysis of LLE.  Marland Kitchen Hiatal hernia   . History of echocardiogram    a. 07/2020 Echo: EF 55-60%, no rwma, Nl RV size/fxn. Mildly dil LA. Mild MR/MS. Sev mitral annular Ca2+. Mild Ao sclerosis w/o stenosis.  Marland Kitchen History of stress test    a. 05/2014 lexiscan MV: EF 67%, no ischemia/infarct.  . Hypercholesteremia   . Hyperglycemia   . Hypertension   . Left Cerebellopontine Angle Meningioma ([place])    a. 10/2016 s/p  Gamma knife.  . Osteoarthritis   . PAF (paroxysmal atrial fibrillation) ([place])    a. Dx 10/2014->converted on amio. CHA2DS2VASc = 3-4-->Eliquis.  . Proteinuria   . Spinal stenosis at L4-L5 level   . Thrombophlebitis of posterior tibial vein ([place]) 06/01/2014  . Trigeminal neuralgia 08/2016    Past Surgical History:  Procedure Laterality Date  . AORTOGRAM    . BACK SURGERY  11/13/2014  . CARDIOVERSION N/A 07/19/2020   Procedure: CARDIOVERSION;  Surgeon: Minna Merritts, MD;  Location: [facility] ORS;  Service: Cardiovascular;  Laterality: N/A;  . CHOLECYSTECTOMY    . COLONOSCOPY WITH PROPOFOL N/A 08/15/2016   Procedure: COLONOSCOPY WITH PROPOFOL;  Surgeon: Lollie Sails, MD;  Location: [facility] ENDOSCOPY;  Service: Endoscopy;  Laterality: N/A;  . KNEE ARTHROSCOPY    . LOWER EXTREMITY ANGIOGRAM      Family History  Problem Relation Age of Onset  . Hypertension Mother   . Stroke Father        3 strokes  . Hypertension Father   . Breast cancer Paternal Grandmother   . Colon cancer Neg Hx     SOCIAL HX: reviewed.    Current Outpatient Medications:  .  ELIQUIS 5 MG TABS tablet, TAKE 1 TABLET BY MOUTH TWICE DAILY, Disp: 180 tablet, Rfl: 1 .  hydrOXYzine (ATARAX/VISTARIL) 10 MG tablet, Take 1 tablet (10 mg total) by mouth daily as needed. Take in the evening., Disp: 30 tablet, Rfl: 0 .  triamcinolone (KENALOG) 0.1 %, Apply 1 application topically 2 (two) times daily., Disp: 30  g, Rfl: 0 .  acetaminophen (TYLENOL) 650 MG CR tablet, Take by mouth., Disp: , Rfl:  .  amiodarone (PACERONE) 200 MG tablet, TAKE 1 TABLET BY MOUTH EVERY DAY, Disp: 90 tablet, Rfl: 1 .  amitriptyline (ELAVIL) 25 MG tablet, TAKE 1 TABLET(25 MG) BY MOUTH AT BEDTIME (Patient taking differently: Take 25 mg by mouth at bedtime. ), Disp: 30 tablet, Rfl: 5 .  amLODipine (NORVASC) 5 MG tablet, TAKE 1 TABLET(5 MG) BY MOUTH TWICE DAILY, Disp: 180 tablet, Rfl: 1 .  atenolol (TENORMIN) 25 MG tablet, TAKE 1 TABLET BY MOUTH EVERY  DAY, Disp: 30 tablet, Rfl: 6 .  atorvastatin (LIPITOR) 40 MG tablet, TAKE 1 TABLET BY MOUTH ONCE DAILY, Disp: 90 tablet, Rfl: 1 .  doxazosin (CARDURA) 8 MG tablet, Take by mouth., Disp: , Rfl:  .  lamoTRIgine (LAMICTAL) 100 MG tablet, TAKE 1 TABLET(100 MG) BY MOUTH TWICE DAILY (Patient taking differently: Take 100 mg by mouth 2 (two) times daily. ), Disp: 60 tablet, Rfl: 11 .  losartan-hydrochlorothiazide (HYZAAR) 100-12.5 MG tablet, TAKE 1 TABLET BY MOUTH DAILY, Disp: 90 tablet, Rfl: 1 .  nystatin cream (MYCOSTATIN), Apply 1 application topically 2 (two) times daily., Disp: 30 g, Rfl: 0 .  Oxcarbazepine (TRILEPTAL) 300 MG tablet, TAKE 1 TABLET(300 MG) BY MOUTH TWICE DAILY (Patient taking differently: Take 300 mg by mouth 2 (two) times daily. ), Disp: 120 tablet, Rfl: 4 .  pantoprazole (PROTONIX) 40 MG tablet, Take by mouth., Disp: , Rfl:   EXAM:  GENERAL: alert. Sounds to be in no acute distress. Answering questions appropriately.  PSYCH/NEURO: pleasant and cooperative, no obvious depression or anxiety, speech and thought processing grossly intact  ASSESSMENT AND PLAN:  Discussed the following assessment and plan:  Problem List Items Addressed This Visit    Shingles    Rash resolved. No open lesions.  Persistent itching - localized to shingles area.  Has tried topical hydrocortisone.  No fever.  No other rash.  Already on amitriptyline.  Treat with TCC and hydroxyzine to help with itching.  Follow.  Notify me if persistent problems.           I discussed the assessment and treatment plan with the patient. The patient was provided an opportunity to ask questions and all were answered. The patient agreed with the plan and demonstrated an understanding of the instructions.   The patient was advised to call back or seek an in-person evaluation if the symptoms worsen or if the condition fails to improve as anticipated.  I provided 15 minutes of non-face-to-face time during this  encounter.   [name], MD

## 2020-09-24 ENCOUNTER — Encounter: Payer: Self-pay | Admitting: Internal Medicine

## 2020-09-24 DIAGNOSIS — B029 Zoster without complications: Secondary | ICD-10-CM | POA: Insufficient documentation

## 2020-09-24 NOTE — Assessment & Plan Note (Signed)
Rash resolved. No open lesions.  Persistent itching - localized to shingles area.  Has tried topical hydrocortisone.  No fever.  No other rash.  Already on amitriptyline.  Treat with TCC and hydroxyzine to help with itching.  Follow.  Notify me if persistent problems.

## 2020-09-26 DIAGNOSIS — R82998 Other abnormal findings in urine: Secondary | ICD-10-CM | POA: Diagnosis not present

## 2020-09-26 DIAGNOSIS — I10 Essential (primary) hypertension: Secondary | ICD-10-CM | POA: Diagnosis not present

## 2020-09-26 DIAGNOSIS — R809 Proteinuria, unspecified: Secondary | ICD-10-CM | POA: Diagnosis not present

## 2020-09-26 DIAGNOSIS — N1832 Chronic kidney disease, stage 3b: Secondary | ICD-10-CM | POA: Diagnosis not present

## 2020-10-05 ENCOUNTER — Other Ambulatory Visit: Payer: Self-pay | Admitting: Internal Medicine

## 2020-10-09 DIAGNOSIS — R82998 Other abnormal findings in urine: Secondary | ICD-10-CM | POA: Diagnosis not present

## 2020-10-09 DIAGNOSIS — I10 Essential (primary) hypertension: Secondary | ICD-10-CM | POA: Diagnosis not present

## 2020-10-09 DIAGNOSIS — R809 Proteinuria, unspecified: Secondary | ICD-10-CM | POA: Diagnosis not present

## 2020-10-09 DIAGNOSIS — N1832 Chronic kidney disease, stage 3b: Secondary | ICD-10-CM | POA: Diagnosis not present

## 2020-10-16 ENCOUNTER — Other Ambulatory Visit: Payer: Self-pay | Admitting: Internal Medicine

## 2020-11-02 ENCOUNTER — Ambulatory Visit: Payer: PPO | Admitting: Cardiovascular Disease

## 2020-11-08 NOTE — Progress Notes (Signed)
Cardiology Office Note  Date:  11/09/2020   ID:  Kristin James, DOB 1955-09-13, MRN [id number]  PCP:  [name], MD   Chief Complaint  Patient presents with  . 3 month follow up     Patient c/o a decrease in her blood pressure since her last office visit here. Medications reviewed by the patient verbally.     HPI:  Kristin James is a pleasant 65 year old woman with history of  paroxysmal atrial fibrillation, no sx HTN,  chronic back pain,  spinal stenosis,  obesity,   Trigeminal neuralgia hospital in September 2015 for acute left lower extremity arterial ischemia secondary to thrombus.  She presents for routine followup of her atrial fibrillation  LOV 05/2019  tumor pressing on her fifth cranial nerve Had gamma knife treatment in the past eval by [facility], they can operate if needed On medication for pain   07/11/2020 she reported heart rates in the low 100s with increased dyspnea on exertion and easy fatigability.  EKG showed a fib.  Seen in clinic Atenolol was increased to 25 mg twice daily  DCCV on 07/19/2020, with restoration of sinus rhythm.   Getting over shingles on her back Did not have vaccine  BP low,started a new diet At home, numbers provided, sometimes 90 systolic Lost couple pounds  EKG personally reviewed by myself on todays visit NSR rate 59 bpm, no St or T wave ABn  Also having chronic Back issue, balance issue, No sciatica  Lab work reviewed HBA1C 6.1 Total chol 153   other past medical history reviewed   Trigeminal neuralgia x 6 months. She has seen several specialists, Including prednisone, Neurontin, trileptal among others  Severe pain left side of face, shocking pain,   Other past medical history back surgery March 2016, lumbar region Prior office visit, EKG documented atrial fibrillation. Started on amiodarone, diltiazem, beta blocker and she converted to normal sinus rhythm. Atenolol dose decreased secondary to  bradycardia  admitted to the hospital on May 21 2014 with acute pain in her left leg. She had no distal pulses. Her creatinine was 1.99 which improved to 1.0 with fluids. She underwent angiogram of the left lower extremity with thrombolysis with TPA of the left pill, tibial peroneal trunk, peroneal arteries and distally, thrombectomy of the vessel with restoration of flow. she does not have any sleep apnea, does have occasional snoring  Stress test was done in the hospital for chest pain. This showed no ischemia. Ejection fraction 67% EKG in the hospital showed normal sinus rhythm with rate 74 beats per minute. EKG dated 05/21/2014   PMH:   has a past medical history of Embolus to lower extremity ([place]), Hiatal hernia, History of echocardiogram, History of stress test, Hypercholesteremia, Hyperglycemia, Hypertension, Left Cerebellopontine Angle Meningioma ([place]), Osteoarthritis, PAF (paroxysmal atrial fibrillation) ([place]), Proteinuria, Spinal stenosis at L4-L5 level, Thrombophlebitis of posterior tibial vein ([place]) (06/01/2014), and Trigeminal neuralgia (08/2016).  PSH:    Past Surgical History:  Procedure Laterality Date  . AORTOGRAM    . BACK SURGERY  11/13/2014  . CARDIOVERSION N/A 07/19/2020   Procedure: CARDIOVERSION;  Surgeon: Minna Merritts, MD;  Location: [facility] ORS;  Service: Cardiovascular;  Laterality: N/A;  . CHOLECYSTECTOMY    . COLONOSCOPY WITH PROPOFOL N/A 08/15/2016   Procedure: COLONOSCOPY WITH PROPOFOL;  Surgeon: Lollie Sails, MD;  Location: [facility];  Service: Endoscopy;  Laterality: N/A;  . KNEE ARTHROSCOPY    . LOWER EXTREMITY ANGIOGRAM      Current  Outpatient Medications  Medication Sig Dispense Refill  . acetaminophen (TYLENOL) 650 MG CR tablet Take by mouth.    Marland Kitchen amiodarone (PACERONE) 200 MG tablet TAKE 1 TABLET BY MOUTH EVERY DAY 90 tablet 1  . amitriptyline (ELAVIL) 25 MG tablet TAKE 1 TABLET(25 MG) BY MOUTH AT BEDTIME 30 tablet 5  . amLODipine  (NORVASC) 5 MG tablet TAKE 1 TABLET(5 MG) BY MOUTH TWICE DAILY 180 tablet 1  . atenolol (TENORMIN) 25 MG tablet TAKE 1 TABLET BY MOUTH EVERY DAY 30 tablet 6  . atorvastatin (LIPITOR) 40 MG tablet TAKE 1 TABLET BY MOUTH ONCE DAILY 90 tablet 1  . doxazosin (CARDURA) 8 MG tablet Take by mouth.    Arne Cleveland 5 MG TABS tablet TAKE 1 TABLET BY MOUTH TWICE DAILY 180 tablet 1  . lamoTRIgine (LAMICTAL) 100 MG tablet TAKE 1 TABLET(100 MG) BY MOUTH TWICE DAILY 60 tablet 11  . losartan-hydrochlorothiazide (HYZAAR) 100-12.5 MG tablet TAKE 1 TABLET BY MOUTH DAILY 90 tablet 1  . nystatin cream (MYCOSTATIN) Apply 1 application topically 2 (two) times daily. 30 g 0  . Oxcarbazepine (TRILEPTAL) 300 MG tablet TAKE 1 TABLET(300 MG) BY MOUTH TWICE DAILY 120 tablet 4  . pantoprazole (PROTONIX) 40 MG tablet Take by mouth.    . triamcinolone (KENALOG) 0.1 % Apply 1 application topically 2 (two) times daily. 30 g 0   No current facility-administered medications for this visit.     Allergies:   Patient has no known allergies.   Social History:  The patient  reports that she has never smoked. She has never used smokeless tobacco. She reports current alcohol use. She reports that she does not use drugs.   Family History:   family history includes Breast cancer in her paternal grandmother; Hypertension in her father and mother; Stroke in her father.    Review of Systems: Review of Systems  Constitutional: Negative.   HENT: Negative.        Pain right side of her head  Respiratory: Negative.   Cardiovascular: Negative.   Gastrointestinal: Negative.   Musculoskeletal: Negative.   Neurological: Negative.        Balance issues  Psychiatric/Behavioral: Negative.   All other systems reviewed and are negative.   PHYSICAL EXAM: VS:  BP 100/60 (BP Location: Left Arm, Patient Position: Sitting, Cuff Size: Normal)   Pulse (!) 59   Ht 5\' 10"  (1.778 m)   Wt 198 lb 8 oz (90 kg)   SpO2 98%   BMI 28.48 kg/m  , BMI  Body mass index is 28.48 kg/m. Constitutional:  oriented to person, place, and time. No distress.  HENT:  Head: Grossly normal Eyes:  no discharge. No scleral icterus.  Neck: No JVD, no carotid bruits  Cardiovascular: Regular rate and rhythm, no murmurs appreciated Pulmonary/Chest: Clear to auscultation bilaterally, no wheezes or rails Abdominal: Soft.  no distension.  no tenderness.  Musculoskeletal: Normal range of motion Neurological:  normal muscle tone. Coordination normal. No atrophy Skin: Skin warm and dry Psychiatric: normal affect, pleasant   Recent Labs: 06/03/2020: ALT 26 06/21/2020: Magnesium 2.0; TSH 2.910 07/11/2020: BUN 28; Creatinine, Ser 1.39; Hemoglobin 15.0; Platelets 252; Potassium 4.5; Sodium 139   Lipid Panel Lab Results  Component Value Date   CHOL 168 03/30/2020   HDL 49.50 03/30/2020   LDLCALC 97 03/30/2020   TRIG 110.0 03/30/2020      Wt Readings from Last 3 Encounters:  11/09/20 198 lb 8 oz (90 kg)  08/03/20 207 lb (93.9  kg)  07/19/20 200 lb (90.7 kg)     ASSESSMENT AND PLAN:  Essential hypertension Will decrease cardura to 4 mg daily BP low at home, orthostasis sx  Hypercholesterolemia Encouraged her to stay on her Lipitor Losing weight will help  Morbid obesity (HCC)We have encouraged continued exercise, careful diet management in an effort to lose weight. On nutrasystem  Atrial fibrillation with RVR ([place]) - Plan: EKG 12-Lead Continue amiodarone, down to 200 daily atenolol in the morning On eliquis  Trigeminal neuralgia of left side of face Pain has returned, tumor returned On nerve pills   Total encounter time more than 25 minutes  Greater than 50% was spent in counseling and coordination of care with the patient     Orders Placed This Encounter  Procedures  . EKG 12-Lead     Signed, Esmond Plants, M.D., Ph.D. 11/09/2020  [place], [place]

## 2020-11-09 ENCOUNTER — Encounter: Payer: Self-pay | Admitting: Cardiovascular Disease

## 2020-11-09 ENCOUNTER — Ambulatory Visit: Payer: PPO | Admitting: Cardiovascular Disease

## 2020-11-09 ENCOUNTER — Other Ambulatory Visit: Payer: Self-pay

## 2020-11-09 VITALS — BP 100/60 | HR 59 | Ht 70.0 in | Wt 198.5 lb

## 2020-11-09 DIAGNOSIS — I10 Essential (primary) hypertension: Secondary | ICD-10-CM

## 2020-11-09 DIAGNOSIS — N183 Chronic kidney disease, stage 3 unspecified: Secondary | ICD-10-CM | POA: Diagnosis not present

## 2020-11-09 DIAGNOSIS — E782 Mixed hyperlipidemia: Secondary | ICD-10-CM

## 2020-11-09 DIAGNOSIS — I480 Paroxysmal atrial fibrillation: Secondary | ICD-10-CM

## 2020-11-09 DIAGNOSIS — R0683 Snoring: Secondary | ICD-10-CM | POA: Diagnosis not present

## 2020-11-09 MED ORDER — AMIODARONE HCL 200 MG PO TABS
200.0000 mg | ORAL_TABLET | Freq: Every day | ORAL | 3 refills | Status: DC
Start: 2020-11-09 — End: 2021-11-04

## 2020-11-09 MED ORDER — DOXAZOSIN MESYLATE 4 MG PO TABS: 4.0000 mg | ORAL_TABLET | Freq: Every day | ORAL | 3 refills | Status: DC

## 2020-11-09 NOTE — Patient Instructions (Addendum)
Medication Instructions:    Amiodarone  200 mg daily  Cardura 4 mg daily  Both these medication have been reduce  Monitor blood pressure and heart rate   For sustained heart rate around 100   Take extra amiodarone and atenolol  Lab work: No new labs needed   Testing/Procedures: No new testing needed   Follow-Up:   . You will need a follow up appointment in 6 months with APP  . Providers on your designated Care Team:   . Murray Hodgkins, NP . Christell Faith, PA-C . Marrianne Mood, PA-C   COVID-19 Vaccine Information can be found at: ShippingScam.co.uk For questions related to vaccine distribution or appointments, please email vaccine@[email or web address] or call [phone].

## 2020-11-12 ENCOUNTER — Telehealth: Payer: Self-pay | Admitting: Cardiovascular Disease

## 2020-11-12 NOTE — Telephone Encounter (Signed)
Patient is calling back stating that her blood pressure has come up to 113/75, HR is 62

## 2020-11-12 NOTE — Telephone Encounter (Signed)
Pt c/o medication issue:  1. Name of Medication: doxasin 4 mg  2. How are you currently taking this medication (dosage and times per day)? Has not taken yet  3. Are you having a reaction (difficulty breathing--STAT)? no  4. What is your medication issue? Patient wanting to know if she should take this medication on days when she had lower BP. Patient had a BP of  95/48 and is hesitant to take medication since it is used for high BP issues.   Patient also has questions about dosage of Atenol from last visit   Please advise

## 2020-11-12 NOTE — Telephone Encounter (Signed)
Was able to return Kristin James phone call, she wanted clarification of BP readings and BP medications. Went over low and highs of BP and HR numbers. Advised to call in if systolic BP becomes under 90 and HR under 50, more so if symptoms of CP, shob, and dizziness emerge. Kristin James verbalized understanding and stated she feels better with the reassurance. Reports BP at home was 95/48, 113/75, and 110/79, HR above 60s and w/o any symptoms. All questions or concerns were address and no additional concerns at this time, will call back for anything further.

## 2020-11-13 ENCOUNTER — Other Ambulatory Visit: Payer: Self-pay | Admitting: Internal Medicine

## 2020-11-13 NOTE — Telephone Encounter (Signed)
Please let pt know that this medication was removed from her list (per Juliann Pulse - per cardiology).  Need to confirm if she is taking and how often.  Does she need refill).

## 2020-11-13 NOTE — Telephone Encounter (Signed)
Cardiology DC 11/12/20 ok to fill could not find reason for DC.

## 2020-11-13 NOTE — Telephone Encounter (Signed)
Pt is not longer using. Only used for shingles.

## 2020-11-13 NOTE — Telephone Encounter (Signed)
[name]

## 2020-11-16 ENCOUNTER — Telehealth: Payer: Self-pay | Admitting: Cardiovascular Disease

## 2020-11-16 MED ORDER — DOXAZOSIN MESYLATE 4 MG PO TABS: 2.0000 mg | ORAL_TABLET | Freq: Every day | ORAL | 3 refills | Status: DC

## 2020-11-16 NOTE — Telephone Encounter (Signed)
Will probably recommend to evenly split the medication some in the morning some in the p.m. If blood pressure low, would cut Cardura down to 2 mg Continue to monitor pressure Further adjustment may be needed as weight drops from her Nutrisystem diet

## 2020-11-16 NOTE — Telephone Encounter (Signed)
Spoke with patient and she reports low blood pressures for about a week. Today her readings are as follows:  Current 112/65 0900 110/62 with HR 63 79/62 HR 60 80/49 HR 53  Inquired how she feels and reports being dizzy when she gets up and stands for a while. She has taken all of her morning medications. Reviewed medications and instructed her to hold evening dose of amlodipine for now and continue monitoring blood pressures. Encouraged her to hydrate, change positions slowly, and hold on to something when standing. Advised that I would send this message to Dr. Rockey Situ to see if there are any further recommendations. She verbalized understanding of our conversation, agreement with plan, and had no further questions for now.

## 2020-11-16 NOTE — Telephone Encounter (Signed)
Called patient back and current blood pressure is 101/61 with HR 58. Reviewed provider recommendations for medications and we reviewed all of them. She requested my chart message with instructions on medications. She verbalized understanding of our conversation, agreement with plan, and had no further questions at this time.

## 2020-11-16 NOTE — Telephone Encounter (Signed)
Pt c/o BP issue: STAT if pt c/o blurred vision, one-sided weakness or slurred speech  1. What are your last 5 BP readings?    79/62   60   110/62  63  80/49  53      2. Are you having any other symptoms (ex. Dizziness, headache, blurred vision, passed out? Pre syncope dizzy interim blurry vision with dizzy spells   3. What is your BP issue?  Just started nutrisystem diet but also just seen in office and meds changed

## 2020-11-22 ENCOUNTER — Ambulatory Visit: Payer: 59 | Admitting: Neurology

## 2020-11-29 ENCOUNTER — Other Ambulatory Visit: Payer: Self-pay | Admitting: Neurology

## 2020-12-06 NOTE — Telephone Encounter (Signed)
My Chart message sent to patient to discontinue doxazosin and continue monitoring.

## 2020-12-06 NOTE — Addendum Note (Signed)
Addended by: Valora Corporal on: 12/06/2020 04:46 PM   Modules accepted: Orders

## 2021-01-01 ENCOUNTER — Telehealth: Payer: Self-pay | Admitting: Internal Medicine

## 2021-01-01 NOTE — Telephone Encounter (Signed)
Pt called and wanted to know if a Disability parking form could be filled out and she be called when its completed

## 2021-01-01 NOTE — Telephone Encounter (Signed)
Yes. Also need cpe scheduled - overdue.

## 2021-01-01 NOTE — Telephone Encounter (Signed)
Patient is having to get a cane due to her balance issues and is requesting to have handicap form. [place] for me to complete?

## 2021-01-02 ENCOUNTER — Telehealth: Payer: Self-pay | Admitting: Internal Medicine

## 2021-01-02 NOTE — Telephone Encounter (Signed)
Left detailed message for patient.

## 2021-01-02 NOTE — Telephone Encounter (Signed)
patient called in about her handicap sticker

## 2021-01-02 NOTE — Telephone Encounter (Signed)
I completed her handicap form. Can you schedule her for a physical?

## 2021-01-02 NOTE — Telephone Encounter (Signed)
Please call and schedule CPE when you cal to let patient know Handicap sticker is ready.

## 2021-01-03 NOTE — Telephone Encounter (Signed)
Pt on her way to pick up. Placed form up front.

## 2021-01-23 ENCOUNTER — Ambulatory Visit: Payer: 59 | Admitting: Neurology

## 2021-01-23 DIAGNOSIS — G319 Degenerative disease of nervous system, unspecified: Secondary | ICD-10-CM | POA: Diagnosis not present

## 2021-01-23 DIAGNOSIS — I639 Cerebral infarction, unspecified: Secondary | ICD-10-CM | POA: Diagnosis not present

## 2021-01-23 DIAGNOSIS — D320 Benign neoplasm of cerebral meninges: Secondary | ICD-10-CM | POA: Diagnosis not present

## 2021-01-23 DIAGNOSIS — G500 Trigeminal neuralgia: Secondary | ICD-10-CM | POA: Diagnosis not present

## 2021-01-23 DIAGNOSIS — D329 Benign neoplasm of meninges, unspecified: Secondary | ICD-10-CM | POA: Diagnosis not present

## 2021-01-23 DIAGNOSIS — I6789 Other cerebrovascular disease: Secondary | ICD-10-CM | POA: Diagnosis not present

## 2021-01-23 DIAGNOSIS — R2689 Other abnormalities of gait and mobility: Secondary | ICD-10-CM | POA: Diagnosis not present

## 2021-01-23 DIAGNOSIS — Z923 Personal history of irradiation: Secondary | ICD-10-CM | POA: Diagnosis not present

## 2021-02-01 ENCOUNTER — Other Ambulatory Visit: Payer: Self-pay | Admitting: Neurology

## 2021-03-01 ENCOUNTER — Other Ambulatory Visit: Payer: Self-pay | Admitting: Cardiovascular Disease

## 2021-03-01 NOTE — Telephone Encounter (Signed)
Pt last saw Dr Rockey Situ 11/09/20, last labs 07/11/20 Creat 1.39, age 65, weight 90kg, based on specified criteria pt is on appropriate dosage of Eliquis 5mg  BID.  Will refill rx.

## 2021-03-18 ENCOUNTER — Ambulatory Visit: Payer: PPO | Admitting: Neurology

## 2021-03-18 ENCOUNTER — Encounter: Payer: Self-pay | Admitting: Neurology

## 2021-03-18 VITALS — BP 162/83 | HR 63 | Ht 60.0 in | Wt 192.0 lb

## 2021-03-18 DIAGNOSIS — B0229 Other postherpetic nervous system involvement: Secondary | ICD-10-CM | POA: Diagnosis not present

## 2021-03-18 DIAGNOSIS — G500 Trigeminal neuralgia: Secondary | ICD-10-CM

## 2021-03-18 DIAGNOSIS — D329 Benign neoplasm of meninges, unspecified: Secondary | ICD-10-CM | POA: Diagnosis not present

## 2021-03-18 DIAGNOSIS — R260 Ataxic gait: Secondary | ICD-10-CM

## 2021-03-18 DIAGNOSIS — Z923 Personal history of irradiation: Secondary | ICD-10-CM | POA: Diagnosis not present

## 2021-03-18 MED ORDER — LAMOTRIGINE 100 MG PO TABS
ORAL_TABLET | ORAL | 3 refills | Status: DC
Start: 2021-03-18 — End: 2021-05-08

## 2021-03-18 MED ORDER — LIDOCAINE 5 % EX PTCH: 1.0000 | MEDICATED_PATCH | CUTANEOUS | 11 refills | Status: DC

## 2021-03-18 NOTE — Progress Notes (Signed)
[facility]  PATIENT: Kristin James DOB: [birth year]  REFERRING DOCTOR OR PCP:  [name] SOURCE: Patient, notes from Dr. Nicki Reaper,  _________________________________   HISTORICAL  CHIEF COMPLAINT:  Chief Complaint  Patient presents with   Follow-up    Rm 1, w/ husband Dominica Severin. Here for meningioma and TN f/u, last OV 11/23/19. TN- on lamotrigine, oxcarbazepine, and amitriptyline. Balance worsen last couple months, but has recently improved. Has changed her diet and is now, in no pain, per pt.     HISTORY OF PRESENT ILLNESS:  Kristin James is a 65 y.o. woman with a meningioma causing left trigeminal neuralgia.  Update 03/18/2021: She has a left cerebellopontine angle meningioma that has been treated with gamma knife in the past.  It is stable on MRI.    Last MRI 01/23/2021 showed no worsening  She feels her pain is very well controlled on her current medications.  She is currently on lamotrigine 100 mg po bid and oxcarbazepine 300 mg bid and amitriptyline 25 mg nighty.  She had gamma knofe 10/2016.   Recent MRIs show no further growth.      She continues to have problems with balance.  She has some falls.   We discussed that her balance issues could be a combination of the meningioma, radiation and medication side effect  Additionally, she had shingles in January 2022.  The distribution was midthoracic going from the back to under the breasts on the left.  She did not experience much pain but had intense itching sensation.  She continues to have some itching sensation.  The skin still show sequela of the rash.  Left cerebellopontine angle meningioma history of She began to experience a dull ache in the left jaw region in mid 2017. Initially, she felt her symptoms were due to a dental issue and she had a tooth removed and a repeat root canal.  A few months later she began to experience short shock like severe pain in the left jaw.  She would get frequent zaps of severe  pain into the left cheek.  When pain was more severe, she couldn't talk.   Brushing her teeth would trigger the most painful sensations.   She was initially placed on gabapentin 100 mg several times a day by her PCP and then Tegretol was added. The Tegretol was switched to Trileptal for tolerability.   MRI of the brain showed that she had a meningioma that involved the facial nerve region.        She continued to have pain and was referred to neurosurgery at [facility] for evaluation of gamma knife procedures.   March 2018, she underwent Gamma knife stereotactic radiosurgery by Dr. Arlan Organ and Dr. Vallarie Mare at [facility] neurosurgery and radiation oncology at a dose of 20 Gy was delivered to the outlined tumor divided into 4 fractions.   She felt pain improved some initially but did not resolve.  Pain worsened a few months later despite increases in medication and she was referred to me.  I placed her on lamotrigine and titrate up to 100 mg p.o. twice daily. She is not a candidate for repeat radiation or surgery.     Imaging:  MRI of the brain 10/01/2016 performed at [place] shows a large left cerebellopontine angle meningioma extending into the left internal auditory canal. There is mass effect on the pons and left vertebral artery. The tumor crosses the expected path of the left cranial nerves V, VI, VII and  VIII.   MRI of the brain 11/17/2016 showing the same mass and mentioning redemonstration of encephalomalacia in the left parieto-occipital region and lacunar infarction in the right cerebellum and thalamus.  MRI 03/23/2019 [facility]) 1.  Redemonstrated extra-axial dural based mass along the left cerebellar pontine angle with extension into the left internal auditory canal, left Meckel's cave, and Pars Nervosa of the left jugular foramen which is similar to possibly slightly decreased in size from exams dated 07/09/2018 and 07/08/2017. 2.  No significant change in surrounding adjacent mass effect,  particularly on the middle cerebellar peduncle and medulla/pons.   MRI 01/20/2021 showed no changes.  REVIEW OF SYSTEMS: Constitutional: No fevers, chills, sweats, or change in appetite Eyes: No visual changes, double vision, eye pain Ear, nose and throat: No hearing loss, ear pain, nasal congestion, sore throat Cardiovascular: No chest pain, palpitations Respiratory:  No shortness of breath at rest or with exertion.   No wheezes GastrointestinaI: No nausea, vomiting, diarrhea, abdominal pain, fecal incontinence Genitourinary:  No dysuria, urinary retention or frequency.  No nocturia. Musculoskeletal:  No neck pain, back pain Integumentary: No rash, pruritus, skin lesions Neurological: as above Psychiatric: No depression at this time.  No anxiety Endocrine: No palpitations, diaphoresis, change in appetite, change in weigh or increased thirst Hematologic/Lymphatic:  No anemia, purpura, petechiae. Allergic/Immunologic: No itchy/runny eyes, nasal congestion, recent allergic reactions, rashes  ALLERGIES: No Known Allergies  HOME MEDICATIONS:  Current Outpatient Medications:    acetaminophen (TYLENOL) 650 MG CR tablet, Take by mouth., Disp: , Rfl:    amiodarone (PACERONE) 200 MG tablet, Take 1 tablet (200 mg total) by mouth daily., Disp: 90 tablet, Rfl: 3   amitriptyline (ELAVIL) 25 MG tablet, TAKE 1 TABLET(25 MG) BY MOUTH AT BEDTIME. FOLLOW UP 03/18/2021 FOR FUTURE REFILLS, Disp: 30 tablet, Rfl: 1   amLODipine (NORVASC) 5 MG tablet, TAKE 1 TABLET(5 MG) BY MOUTH TWICE DAILY, Disp: 180 tablet, Rfl: 1   atenolol (TENORMIN) 25 MG tablet, TAKE 1 TABLET BY MOUTH EVERY DAY, Disp: 30 tablet, Rfl: 6   atorvastatin (LIPITOR) 40 MG tablet, TAKE 1 TABLET BY MOUTH EVERY DAY, Disp: 90 tablet, Rfl: 0   ELIQUIS 5 MG TABS tablet, TAKE 1 TABLET BY MOUTH TWICE DAILY, Disp: 180 tablet, Rfl: 1   lidocaine (LIDODERM) 5 %, Place 1 patch onto the skin daily. Remove & Discard patch within 12 hours or as directed  by MD, Disp: 30 patch, Rfl: 11   losartan-hydrochlorothiazide (HYZAAR) 100-12.5 MG tablet, TAKE 1 TABLET BY MOUTH DAILY, Disp: 90 tablet, Rfl: 1   nystatin cream (MYCOSTATIN), Apply 1 application topically 2 (two) times daily., Disp: 30 g, Rfl: 0   lamoTRIgine (LAMICTAL) 100 MG tablet, TAKE 1 TABLET(100 MG) BY MOUTH TWICE DAILY, Disp: 180 tablet, Rfl: 3  PAST MEDICAL HISTORY: Past Medical History:  Diagnosis Date   Afib ([place])    Cellulitis of right arm    Embolus to lower extremity ([place])    a. 05/2014 s/p thrombolysis of LLE.   Hiatal hernia    History of echocardiogram    a. 07/2020 Echo: EF 55-60%, no rwma, Nl RV size/fxn. Mildly dil LA. Mild MR/MS. Sev mitral annular Ca2+. Mild Ao sclerosis w/o stenosis.   History of stress test    a. 05/2014 lexiscan MV: EF 67%, no ischemia/infarct.   Hypercholesteremia    Hyperglycemia    Hypertension    Left Cerebellopontine Angle Meningioma ([place])    a. 10/2016 s/p Gamma knife.   Osteoarthritis  PAF (paroxysmal atrial fibrillation) ([place])    a. Dx 10/2014->converted on amio. CHA2DS2VASc = 3-4-->Eliquis.   Proteinuria    Shingles    dec 2021   Spinal stenosis at L4-L5 level    Thrombophlebitis of posterior tibial vein ([place]) 06/01/2014   Trigeminal neuralgia 08/2016    PAST SURGICAL HISTORY: Past Surgical History:  Procedure Laterality Date   AORTOGRAM     BACK SURGERY  11/13/2014   CARDIOVERSION N/A 07/19/2020   Procedure: CARDIOVERSION;  Surgeon: Minna Merritts, MD;  Location: [facility] ORS;  Service: Cardiovascular;  Laterality: N/A;   CHOLECYSTECTOMY     COLONOSCOPY WITH PROPOFOL N/A 08/15/2016   Procedure: COLONOSCOPY WITH PROPOFOL;  Surgeon: Lollie Sails, MD;  Location: [facility] ENDOSCOPY;  Service: Endoscopy;  Laterality: N/A;   KNEE ARTHROSCOPY     LOWER EXTREMITY ANGIOGRAM      FAMILY HISTORY: Family History  Problem Relation Age of Onset   Hypertension Mother    Stroke Father        3 strokes   Hypertension Father     Breast cancer Paternal Grandmother    Colon cancer Neg Hx     SOCIAL HISTORY:  Social History   Socioeconomic History   Marital status: Married    Spouse name: Not on file   Number of children: 2   Years of education: Not on file   Highest education level: Not on file  Occupational History   Not on file  Tobacco Use   Smoking status: Never   Smokeless tobacco: Never  Vaping Use   Vaping Use: Never used  Substance and Sexual Activity   Alcohol use: Yes    Alcohol/week: 0.0 standard drinks    Comment: occ. wine   Drug use: No   Sexual activity: Not on file  Other Topics Concern   Not on file  Social History Narrative   Right handed    Caffeine use: coffee (1/2 cup per day)   Social Determinants of Health   Financial Resource Strain: Not on file  Food Insecurity: Not on file  Transportation Needs: Not on file  Physical Activity: Not on file  Stress: Not on file  Social Connections: Not on file  Intimate Partner Violence: Not on file     PHYSICAL EXAM  Vitals:   03/18/21 1123  BP: (!) 162/83  Pulse: 63  Weight: 192 lb (87.1 kg)  Height: 5' (1.524 m)    Body mass index is 37.5 kg/m.   General: The patient is well-developed and well-nourished and in no acute distress  Skin: Sequela of the herpetic eruption around T5 or T6  Neurologic Exam  Mental status: The patient is alert and oriented x 3 at the time of the examination. The patient has apparent normal recent and remote memory, with an apparently normal attention span and concentration ability.   Speech is normal.  Cranial nerves: Extraocular movements are full. Facial symmetry is present.  She has good facial sensation to touch bilateral.  Facial strength is normal.  Trapezius and sternocleidomastoid strength is normal. No dysarthria is noted.   No obvious hearing deficits are noted.  Motor:  Muscle bulk is normal.   Tone is normal. Strength is  5 / 5 in all 4 extremities.   Sensory: Sensory testing  is intact to touch and vibration sensation in all 4 extremities.  Coordination: Cerebellar testing reveals good finger-nose-finger and heel-to-shin bilaterally.  Gait and station: Station is normal.   Gait is mildly wide.Marland Kitchen  Tandem gait is wide but she can walk without her cane. Romberg is negative.   Reflexes: Deep tendon reflexes are symmetric and normal bilaterally.       DIAGNOSTIC DATA (LABS, IMAGING, TESTING) - I reviewed patient records, labs, notes, testing and imaging myself where available.  Lab Results  Component Value Date   WBC 8.0 07/11/2020   HGB 15.0 07/11/2020   HCT 45.8 07/11/2020   MCV 91 07/11/2020   PLT 252 07/11/2020      Component Value Date/Time   NA 139 07/11/2020 1404   NA 142 05/23/2014 0540   K 4.5 07/11/2020 1404   K 3.7 05/23/2014 0540   CL 100 07/11/2020 1404   CL 114 (H) 05/23/2014 0540   CO2 23 07/11/2020 1404   CO2 21 05/23/2014 0540   GLUCOSE 99 07/11/2020 1404   GLUCOSE 117 (H) 06/03/2020 2239   GLUCOSE 90 05/23/2014 0540   BUN 28 (H) 07/11/2020 1404   BUN 16 05/23/2014 0540   CREATININE 1.39 (H) 07/11/2020 1404   CREATININE 1.07 05/23/2014 0540   CALCIUM 9.5 07/11/2020 1404   CALCIUM 7.4 (L) 05/23/2014 0540   PROT 7.7 06/03/2020 1633   PROT 7.4 05/17/2013 1028   ALBUMIN 3.7 06/03/2020 1633   ALBUMIN 3.6 05/17/2013 1028   AST 26 06/03/2020 1633   AST 28 05/17/2013 1028   ALT 26 06/03/2020 1633   ALT 27 05/17/2013 1028   ALKPHOS 117 06/03/2020 1633   ALKPHOS 103 05/17/2013 1028   BILITOT 1.1 06/03/2020 1633   BILITOT 0.8 05/17/2013 1028   GFRNONAA 40 (L) 07/11/2020 1404   GFRNONAA 57 (L) 05/23/2014 0540   GFRAA 46 (L) 07/11/2020 1404   GFRAA >60 05/23/2014 0540   Lab Results  Component Value Date   CHOL 168 03/30/2020   HDL 49.50 03/30/2020   LDLCALC 97 03/30/2020   TRIG 110.0 03/30/2020   CHOLHDL 3 03/30/2020   Lab Results  Component Value Date   HGBA1C 6.1 03/30/2020   No results found for: VITAMINB12 Lab  Results  Component Value Date   TSH 2.910 06/21/2020       ASSESSMENT AND PLAN   Meningioma (HCC)  Trigeminal neuralgia of left side of face  Status post gamma knife treatment  Ataxic gait  Postherpetic neuralgia  1.  Continue lamotrigine 100 mg twice daily,  and amitriptyline nightly.   Stop oxcarbazepine 2.  If pain worsens or if gait worsens, additional treatment of the meningioma, either gamma knife or surgery, may be necessary. 3.   Stay active and exercise as tolerated.   Use cane for safety.    4.   Lidoderm for PHN RTC 6 months, sooner if problems  Ronnae Kaser A. Felecia Shelling, MD, [facility] 3/87/5643, 32:95 PM Certified in Neurology, Clinical Neurophysiology, Sleep Medicine, Pain Medicine and Neuroimaging  [facility] [address] [place], [place] [address] [phone]

## 2021-03-19 ENCOUNTER — Other Ambulatory Visit: Payer: Self-pay | Admitting: Internal Medicine

## 2021-03-19 ENCOUNTER — Encounter: Payer: Self-pay | Admitting: Internal Medicine

## 2021-03-19 ENCOUNTER — Other Ambulatory Visit: Payer: Self-pay

## 2021-03-19 ENCOUNTER — Ambulatory Visit (INDEPENDENT_AMBULATORY_CARE_PROVIDER_SITE_OTHER): Payer: PPO | Admitting: Internal Medicine

## 2021-03-19 VITALS — BP 138/80 | HR 67 | Temp 96.7°F | Resp 16 | Ht 60.0 in | Wt 190.0 lb

## 2021-03-19 DIAGNOSIS — R748 Abnormal levels of other serum enzymes: Secondary | ICD-10-CM

## 2021-03-19 DIAGNOSIS — G500 Trigeminal neuralgia: Secondary | ICD-10-CM

## 2021-03-19 DIAGNOSIS — N183 Chronic kidney disease, stage 3 unspecified: Secondary | ICD-10-CM | POA: Diagnosis not present

## 2021-03-19 DIAGNOSIS — R739 Hyperglycemia, unspecified: Secondary | ICD-10-CM | POA: Diagnosis not present

## 2021-03-19 DIAGNOSIS — I10 Essential (primary) hypertension: Secondary | ICD-10-CM | POA: Diagnosis not present

## 2021-03-19 DIAGNOSIS — D329 Benign neoplasm of meninges, unspecified: Secondary | ICD-10-CM | POA: Diagnosis not present

## 2021-03-19 DIAGNOSIS — Z0000 Encounter for general adult medical examination without abnormal findings: Secondary | ICD-10-CM

## 2021-03-19 DIAGNOSIS — B029 Zoster without complications: Secondary | ICD-10-CM | POA: Diagnosis not present

## 2021-03-19 DIAGNOSIS — I739 Peripheral vascular disease, unspecified: Secondary | ICD-10-CM | POA: Diagnosis not present

## 2021-03-19 DIAGNOSIS — E7800 Pure hypercholesterolemia, unspecified: Secondary | ICD-10-CM | POA: Diagnosis not present

## 2021-03-19 DIAGNOSIS — I4891 Unspecified atrial fibrillation: Secondary | ICD-10-CM

## 2021-03-19 NOTE — Progress Notes (Signed)
Patient ID: Kristin James, female   DOB: 08-07-1956, 65 y.o.   MRN: [id number]   Subjective:    Patient ID: Kristin James, female    DOB: 11-Jul-1956, 65 y.o.   MRN: [id number]  HPI This visit occurred during the SARS-CoV-2 public health emergency.  Safety protocols were in place, including screening questions prior to the visit, additional usage of staff PPE, and extensive cleaning of exam room while observing appropriate contact time as indicated for disinfecting solutions.   Patient here for her physical exam.  Saw Dr Felecia Shelling yesterday.  F/u regarding meningioma causing left trigeminal neuralgia.  She is status posttreatment with gamma knife in the past.  MRI 01/23/2021 showed no worsening.  Feels pain is controlled.  She is currently on Lamictal, oxcarbazepine and amitriptyline.  Some gait issues.  Uses a cane.  Dr. Felecia Shelling recommended continuing Lamictal and amitriptyline.  He did recommend stopping oxcarbazepine.  Saw cardiology in March 2022.  Recommended decreasing Cardura to 4 mg/day.  Amiodarone-200 mg daily.  Continue atenolol and Eliquis.  Denies any increased heart rate or palpitations.  No chest pain or shortness of breath reported.  Discussed diet adjustment.  No abdominal pain reported.   Past Medical History:  Diagnosis Date   Afib ([place])    Cellulitis of right arm    Embolus to lower extremity ([place])    a. 05/2014 s/p thrombolysis of LLE.   Hiatal hernia    History of echocardiogram    a. 07/2020 Echo: EF 55-60%, no rwma, Nl RV size/fxn. Mildly dil LA. Mild MR/MS. Sev mitral annular Ca2+. Mild Ao sclerosis w/o stenosis.   History of stress test    a. 05/2014 lexiscan MV: EF 67%, no ischemia/infarct.   Hypercholesteremia    Hyperglycemia    Hypertension    Left Cerebellopontine Angle Meningioma ([place])    a. 10/2016 s/p Gamma knife.   Osteoarthritis    PAF (paroxysmal atrial fibrillation) ([place])    a. Dx 10/2014->converted on amio. CHA2DS2VASc = 3-4-->Eliquis.   Proteinuria     Shingles    dec 2021   Spinal stenosis at L4-L5 level    Thrombophlebitis of posterior tibial vein ([place]) 06/01/2014   Trigeminal neuralgia 08/2016   Past Surgical History:  Procedure Laterality Date   AORTOGRAM     BACK SURGERY  11/13/2014   CARDIOVERSION N/A 07/19/2020   Procedure: CARDIOVERSION;  Surgeon: Minna Merritts, MD;  Location: [facility] ORS;  Service: Cardiovascular;  Laterality: N/A;   CHOLECYSTECTOMY     COLONOSCOPY WITH PROPOFOL N/A 08/15/2016   Procedure: COLONOSCOPY WITH PROPOFOL;  Surgeon: Lollie Sails, MD;  Location: [facility] ENDOSCOPY;  Service: Endoscopy;  Laterality: N/A;   KNEE ARTHROSCOPY     LOWER EXTREMITY ANGIOGRAM     Family History  Problem Relation Age of Onset   Hypertension Mother    Stroke Father        3 strokes   Hypertension Father    Breast cancer Paternal Grandmother    Colon cancer Neg Hx    Social History   Socioeconomic History   Marital status: Married    Spouse name: Not on file   Number of children: 2   Years of education: Not on file   Highest education level: Not on file  Occupational History   Not on file  Tobacco Use   Smoking status: Never   Smokeless tobacco: Never  Vaping Use   Vaping Use: Never used  Substance and Sexual Activity   Alcohol  use: Yes    Alcohol/week: 0.0 standard drinks    Comment: occ. wine   Drug use: No   Sexual activity: Not on file  Other Topics Concern   Not on file  Social History Narrative   Right handed    Caffeine use: coffee (1/2 cup per day)   Social Determinants of Health   Financial Resource Strain: Not on file  Food Insecurity: Not on file  Transportation Needs: Not on file  Physical Activity: Not on file  Stress: Not on file  Social Connections: Not on file    Review of Systems  Constitutional:  Negative for appetite change and unexpected weight change.  HENT:  Negative for congestion, sinus pressure and sore throat.   Eyes:  Negative for pain and visual disturbance.   Respiratory:  Negative for cough, chest tightness and shortness of breath.   Cardiovascular:  Negative for chest pain, palpitations and leg swelling.  Gastrointestinal:  Negative for abdominal pain, diarrhea, nausea and vomiting.  Genitourinary:  Negative for difficulty urinating and dysuria.  Musculoskeletal:  Negative for joint swelling and myalgias.  Skin:  Negative for color change and rash.  Neurological:  Negative for dizziness, light-headedness and headaches.  Hematological:  Negative for adenopathy. Does not bruise/bleed easily.  Psychiatric/Behavioral:  Negative for agitation and dysphoric mood.       Objective:    Physical Exam Vitals reviewed.  Constitutional:      General: She is not in acute distress.    Appearance: Normal appearance. She is well-developed.  HENT:     Head: Normocephalic and atraumatic.     Right Ear: External ear normal.     Left Ear: External ear normal.  Eyes:     General: No scleral icterus.       Right eye: No discharge.        Left eye: No discharge.     Conjunctiva/sclera: Conjunctivae normal.  Neck:     Thyroid: No thyromegaly.  Cardiovascular:     Rate and Rhythm: Normal rate and regular rhythm.  Pulmonary:     Effort: No tachypnea, accessory muscle usage or respiratory distress.     Breath sounds: Normal breath sounds. No decreased breath sounds or wheezing.  Chest:  Breasts:    Right: No inverted nipple, mass, nipple discharge or tenderness (no axillary adenopathy).     Left: No inverted nipple, mass, nipple discharge or tenderness (no axilarry adenopathy).  Abdominal:     General: Bowel sounds are normal.     Palpations: Abdomen is soft.     Tenderness: There is no abdominal tenderness.  Musculoskeletal:        General: No swelling or tenderness.     Cervical back: Neck supple.  Lymphadenopathy:     Cervical: No cervical adenopathy.  Skin:    Findings: No erythema or rash.  Neurological:     Mental Status: She is alert and  oriented to person, place, and time.  Psychiatric:        Mood and Affect: Mood normal.        Behavior: Behavior normal.    BP 138/80   Pulse 67   Temp (!) 96.7 F (35.9 C)   Resp 16   Ht 5' (1.524 m)   Wt 190 lb (86.2 kg)   SpO2 98%   BMI 37.11 kg/m  Wt Readings from Last 3 Encounters:  03/19/21 190 lb (86.2 kg)  03/18/21 192 lb (87.1 kg)  11/09/20 198 lb 8 oz (90  kg)    Outpatient Encounter Medications as of 03/19/2021  Medication Sig   acetaminophen (TYLENOL) 650 MG CR tablet Take by mouth.   amiodarone (PACERONE) 200 MG tablet Take 1 tablet (200 mg total) by mouth daily.   amitriptyline (ELAVIL) 25 MG tablet TAKE 1 TABLET(25 MG) BY MOUTH AT BEDTIME. FOLLOW UP 03/18/2021 FOR FUTURE REFILLS   amLODipine (NORVASC) 5 MG tablet TAKE 1 TABLET(5 MG) BY MOUTH TWICE DAILY   atenolol (TENORMIN) 25 MG tablet TAKE 1 TABLET BY MOUTH EVERY DAY   atorvastatin (LIPITOR) 40 MG tablet TAKE 1 TABLET BY MOUTH EVERY DAY   ELIQUIS 5 MG TABS tablet TAKE 1 TABLET BY MOUTH TWICE DAILY   lamoTRIgine (LAMICTAL) 100 MG tablet TAKE 1 TABLET(100 MG) BY MOUTH TWICE DAILY   lidocaine (LIDODERM) 5 % Place 1 patch onto the skin daily. Remove & Discard patch within 12 hours or as directed by MD   losartan-hydrochlorothiazide (HYZAAR) 100-12.5 MG tablet TAKE 1 TABLET BY MOUTH DAILY   nystatin cream (MYCOSTATIN) Apply 1 application topically 2 (two) times daily.   No facility-administered encounter medications on file as of 03/19/2021.     Lab Results  Component Value Date   WBC 8.0 07/11/2020   HGB 15.0 07/11/2020   HCT 45.8 07/11/2020   PLT 252 07/11/2020   GLUCOSE 89 03/19/2021   CHOL 166 03/19/2021   TRIG 126.0 03/19/2021   HDL 51.80 03/19/2021   LDLCALC 89 03/19/2021   ALT 32 03/19/2021   AST 27 03/19/2021   NA 138 03/19/2021   K 3.9 03/19/2021   CL 101 03/19/2021   CREATININE 1.07 03/19/2021   BUN 19 03/19/2021   CO2 29 03/19/2021   TSH 2.910 06/21/2020   INR 1.5 (H) 06/03/2020    HGBA1C 6.1 03/19/2021   MICROALBUR 35.5 (H) 04/08/2016       Assessment & Plan:   Problem List Items Addressed This Visit     Atrial fibrillation with RVR ([place])    On amiodarone and atenolol.  Continue Eliquis.  Stable.       CKD (chronic kidney disease) stage 3, GFR 30-59 ml/min (HCC)    Avoid anti-inflammatories.  Stay hydrated.  Follow metabolic panel.       Elevated alkaline phosphatase level    Previously saw GI.  Work-up including MRI/MRCP unrevealing.  Follow liver function test.  Recent alk phos level 130.       Health care maintenance    Physical 03/19/2021.  Pap 11/29/2019 negative with negative HPV.  Mammogram 06/08/2020 BI-RADS 1.  Colonoscopy 08/15/2016-pathology-rectal polyp x3 (hyperplastic).  Recommended follow-up colonoscopy in 5 years.       Hypercholesterolemia    Continue Lipitor.  Low cholesterol diet and exercise.  Follow lipid panel liver function test.       Relevant Orders   Hepatic function panel (Completed)   Basic metabolic panel (Completed)   Lipid panel (Completed)   Hyperglycemia    Low-carb diet and exercise.  Follow [place] and A1c.       Relevant Orders   Hemoglobin A1c (Completed)   Hypertension    Blood pressure as outlined.  Continue losartan/HCTZ, Tenormin and amlodipine.  Follow pressures.  Follow metabolic panel.       Meningioma [facility])    Being followed by neurology.  Status post gamma knife treatment.  No pain currently.  Seeing Dr. Felecia Shelling.  Currently on Lamictal and amitriptyline.       PAD (peripheral artery disease) ([place])  Status post angioplasty.  On Eliquis.  Continue risk factor modification.  Continue statin.       Shingles    Had shingles January 2022.       Trigeminal neuralgia of left side of face    No pain.  Off gabapentin.  Follow.       Other Visit Diagnoses     Routine general medical examination at a health care facility    -  Primary        [name], MD

## 2021-03-20 LAB — HEPATIC FUNCTION PANEL
ALT: 32 U/L (ref 0–35)
AST: 27 U/L (ref 0–37)
Albumin: 4.3 g/dL (ref 3.5–5.2)
Alkaline Phosphatase: 113 U/L (ref 39–117)
Bilirubin, Direct: 0.1 mg/dL (ref 0.0–0.3)
Total Bilirubin: 0.7 mg/dL (ref 0.2–1.2)
Total Protein: 6.7 g/dL (ref 6.0–8.3)

## 2021-03-20 LAB — LIPID PANEL
Cholesterol: 166 mg/dL (ref 0–200)
HDL: 51.8 mg/dL (ref >39.00)
LDL Cholesterol: 89 mg/dL (ref 0–99)
NonHDL: 113.97
Total CHOL/HDL Ratio: 3
Triglycerides: 126 mg/dL (ref 0.0–149.0)
VLDL: 25.2 mg/dL (ref 0.0–40.0)

## 2021-03-20 LAB — BASIC METABOLIC PANEL
BUN: 19 mg/dL (ref 6–23)
CO2: 29 mEq/L (ref 19–32)
Calcium: 9.1 mg/dL (ref 8.4–10.5)
Chloride: 101 mEq/L (ref 96–112)
Creatinine, Ser: 1.07 mg/dL (ref 0.40–1.20)
GFR: 54.43 mL/min — ABNORMAL LOW (ref >60.00)
Glucose, Bld: 89 mg/dL (ref 70–99)
Potassium: 3.9 mEq/L (ref 3.5–5.1)
Sodium: 138 mEq/L (ref 135–145)

## 2021-03-20 LAB — HEMOGLOBIN A1C: Hgb A1c MFr Bld: 6.1 % (ref 4.6–6.5)

## 2021-03-25 ENCOUNTER — Encounter: Payer: Self-pay | Admitting: Internal Medicine

## 2021-03-25 NOTE — Assessment & Plan Note (Signed)
Physical 03/19/2021.  Pap 11/29/2019 negative with negative HPV.  Mammogram 06/08/2020 BI-RADS 1.  Colonoscopy 08/15/2016-pathology-rectal polyp x3 (hyperplastic).  Recommended follow-up colonoscopy in 5 years.

## 2021-03-25 NOTE — Assessment & Plan Note (Signed)
Continue Lipitor.  Low-cholesterol diet and exercise.  Follow lipid panel liver function test. 

## 2021-03-25 NOTE — Assessment & Plan Note (Signed)
Avoid antiinflammatories.  Stay hydrated.  Follow metabolic panel.   

## 2021-03-25 NOTE — Assessment & Plan Note (Signed)
Low-carb diet and exercise.  Follow Mc[place]igh and A1c. 

## 2021-03-25 NOTE — Assessment & Plan Note (Signed)
Blood pressure as outlined.  Continue losartan/HCTZ, Tenormin and amlodipine.  Follow pressures.  Follow metabolic panel.

## 2021-03-25 NOTE — Assessment & Plan Note (Signed)
No pain.  Off gabapentin.  Follow.

## 2021-03-25 NOTE — Assessment & Plan Note (Signed)
Had shingles January 2022.

## 2021-03-25 NOTE — Assessment & Plan Note (Signed)
On amiodarone and atenolol.  Continue Eliquis.  Stable. 

## 2021-03-25 NOTE — Assessment & Plan Note (Signed)
Status post angioplasty.  On Eliquis.  Continue risk factor modification.  Continue statin. 

## 2021-03-25 NOTE — Assessment & Plan Note (Signed)
Being followed by neurology.  Status post gamma knife treatment.  No pain currently.  Seeing Dr. Sater.  Currently on Lamictal and amitriptyline. 

## 2021-03-25 NOTE — Assessment & Plan Note (Signed)
Previously saw GI.  Work-up including MRI/MRCP unrevealing.  Follow liver function test.  Recent alk phos level 130.

## 2021-03-26 DIAGNOSIS — I10 Essential (primary) hypertension: Secondary | ICD-10-CM | POA: Diagnosis not present

## 2021-03-26 DIAGNOSIS — N1831 Chronic kidney disease, stage 3a: Secondary | ICD-10-CM | POA: Diagnosis not present

## 2021-03-26 DIAGNOSIS — R809 Proteinuria, unspecified: Secondary | ICD-10-CM | POA: Diagnosis not present

## 2021-03-27 ENCOUNTER — Telehealth: Payer: Self-pay | Admitting: Internal Medicine

## 2021-03-27 DIAGNOSIS — B351 Tinea unguium: Secondary | ICD-10-CM

## 2021-03-27 NOTE — Telephone Encounter (Signed)
Larena Glassman see me about this before calling.  Please call Ms Peduzzi and confirm she wanted to be seen - [place].  Confirm reason.  Thanks.

## 2021-03-28 NOTE — Telephone Encounter (Signed)
Order placed for podiatry referral.   

## 2021-03-28 NOTE — Addendum Note (Signed)
Addended by: Alisa Graff on: 03/28/2021 08:55 PM   Modules accepted: Orders

## 2021-03-28 NOTE — Telephone Encounter (Signed)
Patient is requesting to see [place] to be seen for fungus on her toe nails. It is both feet and OTC treatment is not working.

## 2021-03-31 ENCOUNTER — Other Ambulatory Visit: Payer: Self-pay | Admitting: Internal Medicine

## 2021-03-31 ENCOUNTER — Other Ambulatory Visit: Payer: Self-pay | Admitting: Cardiovascular Disease

## 2021-04-03 ENCOUNTER — Other Ambulatory Visit: Payer: Self-pay | Admitting: Neurology

## 2021-04-08 ENCOUNTER — Ambulatory Visit: Payer: PPO | Admitting: Podiatry

## 2021-04-08 ENCOUNTER — Other Ambulatory Visit: Payer: Self-pay

## 2021-04-08 DIAGNOSIS — M79675 Pain in left toe(s): Secondary | ICD-10-CM | POA: Diagnosis not present

## 2021-04-08 DIAGNOSIS — M79674 Pain in right toe(s): Secondary | ICD-10-CM | POA: Diagnosis not present

## 2021-04-08 DIAGNOSIS — L608 Other nail disorders: Secondary | ICD-10-CM | POA: Diagnosis not present

## 2021-04-08 DIAGNOSIS — B351 Tinea unguium: Secondary | ICD-10-CM

## 2021-04-08 DIAGNOSIS — L603 Nail dystrophy: Secondary | ICD-10-CM

## 2021-04-08 NOTE — Progress Notes (Signed)
  Subjective:  Patient ID: Kristin James, female    DOB: 23-Mar-1956,  MRN: [id number]  Chief Complaint  Patient presents with   Nail Problem    np-toenail fungus-non req-dr. Randell Patient scott refer    65 y.o. female presents with the above complaint. History confirmed with patient.  All the nails have progressively worsened and become discolored and thickened she has difficulty cutting them are causing her discomfort.  She present ingrown toenails removed with Dr. Paulla Dolly.  Objective:  Physical Exam: warm, good capillary refill, no trophic changes or ulcerative lesions, normal DP and PT pulses, and normal sensory exam. Left Foot: dystrophic yellowed discolored nail plates with subungual debris Right Foot: dystrophic yellowed discolored nail plates with subungual debris  Assessment:   1. Nail dystrophy   2. Pain due to onychomycosis of toenails of both feet      Plan:  Patient was evaluated and treated and all questions answered.  Discussed treatment and etiology of nail fungus including topical oral and laser therapy.  A culture of the nail plate was taken today.  I will let her know what her results are and we will discuss further treatment at that point.  All 10 dystrophic mycotic nails were debrided in length and thickness with a sharp nail nipper today.  Return for will call to schedule after test results .

## 2021-04-19 ENCOUNTER — Encounter: Payer: Self-pay | Admitting: Podiatry

## 2021-04-20 ENCOUNTER — Other Ambulatory Visit: Payer: Self-pay | Admitting: Neurology

## 2021-05-03 ENCOUNTER — Ambulatory Visit: Payer: PPO | Admitting: Nurse Practitioner

## 2021-05-03 ENCOUNTER — Encounter: Payer: Self-pay | Admitting: Nurse Practitioner

## 2021-05-03 ENCOUNTER — Other Ambulatory Visit: Payer: Self-pay

## 2021-05-03 VITALS — BP 152/82 | HR 59 | Ht 60.0 in | Wt 188.0 lb

## 2021-05-03 DIAGNOSIS — E782 Mixed hyperlipidemia: Secondary | ICD-10-CM

## 2021-05-03 DIAGNOSIS — I480 Paroxysmal atrial fibrillation: Secondary | ICD-10-CM | POA: Diagnosis not present

## 2021-05-03 DIAGNOSIS — I10 Essential (primary) hypertension: Secondary | ICD-10-CM | POA: Diagnosis not present

## 2021-05-03 DIAGNOSIS — N183 Chronic kidney disease, stage 3 unspecified: Secondary | ICD-10-CM | POA: Diagnosis not present

## 2021-05-03 NOTE — Progress Notes (Signed)
Office Visit    Patient Name: Kristin James Date of Encounter: 05/03/2021  Primary Care Provider:  [name], MD Primary Cardiologist:  Ida Rogue, MD  Chief Complaint    65 year old female with a history of paroxysmal atrial fibrillation status post cardioversion (November 2021), left lower extremity embolism status post thrombolysis (September 2015), hypertension, hyperlipidemia, meningioma status post gamma knife (March 2018), CKD III, and trigeminal neuralgia, presents for follow-up related to PAF.  Past Medical History    Past Medical History:  Diagnosis Date   Afib ([place])    Cellulitis of right arm    Embolus to lower extremity ([place])    a. 05/2014 s/p thrombolysis of LLE.   Hiatal hernia    History of echocardiogram    a. 07/2020 Echo: EF 55-60%, no rwma, Nl RV size/fxn. Mildly dil LA. Mild MR/MS. Sev mitral annular Ca2+. Mild Ao sclerosis w/o stenosis.   History of stress test    a. 05/2014 lexiscan MV: EF 67%, no ischemia/infarct.   Hypercholesteremia    Hyperglycemia    Hypertension    Left Cerebellopontine Angle Meningioma ([place])    a. 10/2016 s/p Gamma knife.   Osteoarthritis    PAF (paroxysmal atrial fibrillation) ([place])    a. Dx 10/2014->converted on amio. CHA2DS2VASc = 3-4-->Eliquis.   Proteinuria    Shingles    dec 2021   Spinal stenosis at L4-L5 level    Thrombophlebitis of posterior tibial vein ([place]) 06/01/2014   Trigeminal neuralgia 08/2016   Past Surgical History:  Procedure Laterality Date   AORTOGRAM     BACK SURGERY  11/13/2014   CARDIOVERSION N/A 07/19/2020   Procedure: CARDIOVERSION;  Surgeon: Minna Merritts, MD;  Location: [facility] ORS;  Service: Cardiovascular;  Laterality: N/A;   CHOLECYSTECTOMY     COLONOSCOPY WITH PROPOFOL N/A 08/15/2016   Procedure: COLONOSCOPY WITH PROPOFOL;  Surgeon: Lollie Sails, MD;  [facility] ENDOSCOPY;  Service: Endoscopy;  Laterality: N/A;   KNEE ARTHROSCOPY     LOWER EXTREMITY ANGIOGRAM       Allergies  No Known Allergies  History of Present Illness    65 year old female with the above past medical history including paroxysmal atrial fibrillation, left lower extremity embolism status post thrombolysis (September 2015), hypertension, hyperlipidemia, CKD III, meningioma status post gamma knife (March 2018 represents, and trigeminal neuralgia.  She previously underwent stress testing in September 2015, which was nonischemic and showed normal LV function.  She was incidentally found to have atrial fibrillation at the time of back surgery in March 2016.  She initially converted on amiodarone therapy and has been managed over the years with amiodarone, atenolol, and Eliquis.  She was noted back in atrial fibrillation at office follow-up in October 2021.  Amiodarone was increased to 200 mg daily.  Echocardiogram showed an EF of 55 to 60% without regional wall motion abnormalities, and mild MR/MS.  Creatinine was mildly elevated at 1.4 and she was referred to nephrology.  She was also referred to pulmonology for sleep evaluation.  She underwent successful outpatient cardioversion on July 19, 2020.  Blood pressures were soft at follow-up in March and doxazosin therapy was initially reduced and subsequently discontinued.  Since her last visit, she has felt exceptionally well.  She monitors her blood pressures at home and typically runs in the 1 teens to low 120s.  She walks on her treadmill about 3 times per week for 20 or 30 minutes without symptoms or limitations.  She has not had any  palpitations or recurrence of atrial fibrillation that she is aware of.  She denies chest pain, dyspnea, PND, orthopnea, dizziness, syncope, edema, or early satiety.  Home Medications    Current Outpatient Medications  Medication Sig Dispense Refill   acetaminophen (TYLENOL) 650 MG CR tablet Take by mouth.     amiodarone (PACERONE) 200 MG tablet Take 1 tablet (200 mg total) by mouth daily. 90 tablet 3    amitriptyline (ELAVIL) 25 MG tablet TAKE 1 TABLET(25 MG) BY MOUTH AT BEDTIME. 30 tablet 11   amitriptyline (ELAVIL) 25 MG tablet Take 1 tablet by mouth daily.     amLODipine (NORVASC) 5 MG tablet TAKE 1 TABLET(5 MG) BY MOUTH TWICE DAILY 180 tablet 1   amLODipine (NORVASC) 5 MG tablet Take by mouth.     atenolol (TENORMIN) 25 MG tablet TAKE 1 TABLET BY MOUTH EVERY DAY 30 tablet 3   atorvastatin (LIPITOR) 40 MG tablet TAKE 1 TABLET BY MOUTH EVERY DAY 90 tablet 0   ELIQUIS 5 MG TABS tablet TAKE 1 TABLET BY MOUTH TWICE DAILY 180 tablet 1   lamoTRIgine (LAMICTAL) 100 MG tablet TAKE 1 TABLET(100 MG) BY MOUTH TWICE DAILY 180 tablet 3   lamoTRIgine (LAMICTAL) 100 MG tablet 2 (two) times a day 2 x     lidocaine (LIDODERM) 5 % Place 1 patch onto the skin daily. Remove & Discard patch within 12 hours or as directed by MD 30 patch 11   losartan-hydrochlorothiazide (HYZAAR) 100-12.5 MG tablet TAKE 1 TABLET BY MOUTH DAILY 90 tablet 1   Oxcarbazepine (TRILEPTAL) 300 MG tablet Take 150 mg by mouth daily.     doxazosin (CARDURA) 4 MG tablet Take 4 mg by mouth daily. (Patient not taking: Reported on 05/03/2021)     nystatin cream (MYCOSTATIN) Apply 1 application topically 2 (two) times daily. (Patient not taking: Reported on 05/03/2021) 30 g 0   No current facility-administered medications for this visit.     Review of Systems    Doing well. She denies chest pain, palpitations, dyspnea, pnd, orthopnea, n, v, dizziness, syncope, edema, weight gain, or early satiety.  All other systems reviewed and are otherwise negative except as noted above.  Physical Exam    VS:  BP (!) 152/82 (BP Location: Left Arm, Patient Position: Sitting, Cuff Size: Normal)   Pulse (!) 59   Ht 5' (1.524 m)   Wt 188 lb (85.3 kg)   SpO2 96%   BMI 36.72 kg/m  , BMI Body mass index is 36.72 kg/m.     GEN: Well nourished, well developed, in no acute distress. HEENT: normal. Neck: Supple, no JVD, carotid bruits, or masses. Cardiac:  RRR, 1/6 syst murmur @ upper sternal border, no rubs, or gallops. No clubbing, cyanosis, edema.  Radials/PT 2+ and equal bilaterally.  Respiratory:  Respirations regular and unlabored, clear to auscultation bilaterally. GI: Soft, nontender, nondistended, BS + x 4. MS: no deformity or atrophy. Skin: warm and dry, no rash. Neuro:  Strength and sensation are intact. Psych: Normal affect.  Accessory Clinical Findings    ECG personally reviewed by me today - sinus brady, 59, - no acute changes.  Lab Results  Component Value Date   WBC 8.0 07/11/2020   HGB 15.0 07/11/2020   HCT 45.8 07/11/2020   MCV 91 07/11/2020   PLT 252 07/11/2020   Lab Results  Component Value Date   CREATININE 1.07 03/19/2021   BUN 19 03/19/2021   NA 138 03/19/2021   K 3.9 03/19/2021  CL 101 03/19/2021   CO2 29 03/19/2021   Lab Results  Component Value Date   ALT 32 03/19/2021   AST 27 03/19/2021   GGT 56 09/08/2019   ALKPHOS 113 03/19/2021   BILITOT 0.7 03/19/2021   Lab Results  Component Value Date   CHOL 166 03/19/2021   HDL 51.80 03/19/2021   LDLCALC 89 03/19/2021   TRIG 126.0 03/19/2021   CHOLHDL 3 03/19/2021    Lab Results  Component Value Date   HGBA1C 6.1 03/19/2021    Assessment & Plan    1.  Paroxysmal atrial fibrillation: Patient has been doing exceptionally well without any recent episodes of palpitations or tachycardias.  He has been relatively active without symptoms or limitations.  She remains on amiodarone and Eliquis.  LFTs were normal in July with normal TSH last October.  2.  Essential hypertension: Blood pressure elevated today though she brings records from home and pressures are normal at home.  She admits to whitecoat hypertension.  Continue current regimen she will continue to follow at home.  3.  Hyperlipidemia: LDL was 89 in July.  She remains on statin therapy.  4.  Stage III chronic kidney disease: Creatinine 1.07 on July 19.  She is now followed by nephrology.   She remains on ARB therapy.  5.  Disposition: Follow-up in clinic in 6 months or sooner if necessary.   Murray Hodgkins, NP 05/03/2021, 10:28 AM

## 2021-05-03 NOTE — Patient Instructions (Signed)
Medication Instructions:  No changes at this time.   *If you need a refill on your cardiac medications before your next appointment, please call your pharmacy*   Lab Work: None  If you have labs (blood work) drawn today and your tests are completely normal, you will receive your results only by: [place] (if you have MyChart) OR A paper copy in the mail If you have any lab test that is abnormal or we need to change your treatment, we will call you to review the results.   Testing/Procedures: None   Follow-Up: At [facility], you and your health needs are our priority.  As part of our continuing mission to provide you with exceptional heart care, we have created designated Provider Care Teams.  These Care Teams include your primary Cardiologist (physician) and Advanced Practice Providers (APPs -  Physician Assistants and Nurse Practitioners) who all work together to provide you with the care you need, when you need it.   Your next appointment:   6 month(s)  The format for your next appointment:   In Person  Provider:   Ida Rogue, MD or Murray Hodgkins, NP

## 2021-05-06 ENCOUNTER — Other Ambulatory Visit: Payer: Self-pay | Admitting: Neurology

## 2021-05-08 ENCOUNTER — Telehealth: Payer: Self-pay | Admitting: Neurology

## 2021-05-08 MED ORDER — LAMOTRIGINE 100 MG PO TABS: ORAL_TABLET | ORAL | 3 refills | Status: DC

## 2021-05-08 NOTE — Telephone Encounter (Signed)
Pt called requesting refill for lamoTRIgine (LAMICTAL) 100 MG tablet. Pharmacy [facility] [phone].

## 2021-05-08 NOTE — Telephone Encounter (Signed)
Called the patient and she states that the pharmacy never received anything from [place] in July to place on file. Advised I would resend the script.

## 2021-05-29 ENCOUNTER — Other Ambulatory Visit: Payer: Self-pay | Admitting: Cardiovascular Disease

## 2021-06-19 ENCOUNTER — Other Ambulatory Visit: Payer: Self-pay | Admitting: Internal Medicine

## 2021-06-19 DIAGNOSIS — Z1231 Encounter for screening mammogram for malignant neoplasm of breast: Secondary | ICD-10-CM

## 2021-07-10 ENCOUNTER — Ambulatory Visit
Admission: RE | Admit: 2021-07-10 | Discharge: 2021-07-10 | Disposition: A | Discharge status: Home / Self Care | Payer: PPO | Source: Ambulatory Visit | Attending: Internal Medicine | Admitting: Internal Medicine

## 2021-07-10 ENCOUNTER — Other Ambulatory Visit: Payer: Self-pay

## 2021-07-10 DIAGNOSIS — Z1231 Encounter for screening mammogram for malignant neoplasm of breast: Secondary | ICD-10-CM | POA: Diagnosis not present

## 2021-07-28 ENCOUNTER — Other Ambulatory Visit: Payer: Self-pay | Admitting: Cardiovascular Disease

## 2021-08-26 ENCOUNTER — Other Ambulatory Visit: Payer: Self-pay | Admitting: Neurology

## 2021-08-27 ENCOUNTER — Other Ambulatory Visit: Payer: Self-pay | Admitting: Cardiovascular Disease

## 2021-08-27 NOTE — Telephone Encounter (Signed)
Prescription refill request for Eliquis received. Indication: PAF Last office visit: 05/03/21  Laban Emperor NP Scr: 1.07 on 03/19/21 Age: 65 Weight: 85.3kg  Based on above findings Eliquis 5mg  twice daily is the appropriate dose.  Refill approved.

## 2021-08-27 NOTE — Telephone Encounter (Signed)
Refill request

## 2021-09-14 ENCOUNTER — Other Ambulatory Visit: Payer: Self-pay | Admitting: Internal Medicine

## 2021-09-26 ENCOUNTER — Other Ambulatory Visit: Payer: Self-pay | Admitting: Internal Medicine

## 2021-09-27 NOTE — Telephone Encounter (Signed)
Last apt 03/19/21 No future apt scheduled.  Sent in script

## 2021-11-03 NOTE — Progress Notes (Signed)
Cardiology Office Note  Date:  11/04/2021   ID:  Kristin James, DOB 07-31-1956, MRN [id number]  PCP:  [name], MD   Chief Complaint  Patient presents with   6 month follow up     "Doing well." Medications reviewed by the patient verbally.     HPI:  Ms. Douthitt is a pleasant 66 year old woman with history of  paroxysmal atrial fibrillation, no sx HTN,  chronic back pain,  spinal stenosis,  obesity,   Trigeminal neuralgia hospital in September 2015 for acute left lower extremity arterial ischemia secondary to thrombus.  She presents for routine followup of her atrial fibrillation  LOV 3/22 In follow-up today reports that she is very limited secondary to Chronic pain, back, down legs Feels that she does not have a good remedy for her discomfort Has been declined NSAIDS Tried mexolicam , worked well, wants to take PRN Wants to try 1 pill every 3 to 4 days, feels this would help her  Does not feel afib, Back in it today Denies shortness of breath, leg swelling, no chest pain   tumor pressing on her fifth cranial nerve Had gamma knife treatment in the past "Still there", followed with MRI yearly   Rate at home 70s Reports blood pressure stable though elevated on today's visit, was rushing 142/100  EKG personally reviewed by myself on todays visit Atrial fibrillation ventricular rate 82 bpm no significant ST-T wave changes  Lab work reviewed HBA1C 6.1 Total chol 153   other past medical history reviewed   Trigeminal neuralgia x 6 months. She has seen several specialists, Including prednisone, Neurontin, trileptal among others  Severe pain left side of face, shocking pain,   Other past medical history back surgery March 2016, lumbar region Prior office visit, EKG documented atrial fibrillation. Started on amiodarone, diltiazem, beta blocker and she converted to normal sinus rhythm. Atenolol dose decreased secondary to bradycardia   admitted to the hospital on  May 21 2014 with acute pain in her left leg. She had no distal pulses. Her creatinine was 1.99 which improved to 1.0 with fluids. She underwent angiogram of the left lower extremity with thrombolysis with TPA of the left pill, tibial peroneal trunk, peroneal arteries and distally, thrombectomy of the vessel with restoration of flow. she does not have any sleep apnea, does have occasional snoring   Stress test was done in the hospital for chest pain. This showed no ischemia. Ejection fraction 67% EKG in the hospital showed normal sinus rhythm with rate 74 beats per minute. EKG dated 05/21/2014    PMH:   has a past medical history of Afib ([place]), Cellulitis of right arm, Embolus to lower extremity ([place]), Hiatal hernia, History of echocardiogram, History of stress test, Hypercholesteremia, Hyperglycemia, Hypertension, Left Cerebellopontine Angle Meningioma ([place]), Osteoarthritis, PAF (paroxysmal atrial fibrillation) ([place]), Proteinuria, Shingles, Spinal stenosis at L4-L5 level, Thrombophlebitis of posterior tibial vein ([place]) (06/01/2014), and Trigeminal neuralgia (08/2016).  PSH:    Past Surgical History:  Procedure Laterality Date   AORTOGRAM     BACK SURGERY  11/13/2014   CARDIOVERSION N/A 07/19/2020   Procedure: CARDIOVERSION;  Surgeon: Minna Merritts, MD;  Location: [facility] ORS;  Service: Cardiovascular;  Laterality: N/A;   CHOLECYSTECTOMY     COLONOSCOPY WITH PROPOFOL N/A 08/15/2016   Procedure: COLONOSCOPY WITH PROPOFOL;  Surgeon: Lollie Sails, MD;  Location: [facility] ENDOSCOPY;  Service: Endoscopy;  Laterality: N/A;   KNEE ARTHROSCOPY     LOWER EXTREMITY ANGIOGRAM  Current Outpatient Medications  Medication Sig Dispense Refill   acetaminophen (TYLENOL) 650 MG CR tablet Take by mouth.     amitriptyline (ELAVIL) 25 MG tablet TAKE 1 TABLET(25 MG) BY MOUTH AT BEDTIME. 30 tablet 11   amLODipine (NORVASC) 5 MG tablet TAKE 1 TABLET(5 MG) BY MOUTH TWICE DAILY 180 tablet 1   atenolol  (TENORMIN) 25 MG tablet TAKE 1 TABLET BY MOUTH EVERY DAY 30 tablet 3   atorvastatin (LIPITOR) 40 MG tablet TAKE 1 TABLET BY MOUTH EVERY DAY 90 tablet 3   ELIQUIS 5 MG TABS tablet TAKE 1 TABLET BY MOUTH TWICE DAILY 180 tablet 1   furosemide (LASIX) 20 MG tablet Take 1 tablet (20 mg total) by mouth daily as needed. Sparingly for leg swelling 45 tablet 3   lamoTRIgine (LAMICTAL) 100 MG tablet TAKE 1 TABLET(100 MG) BY MOUTH TWICE DAILY 180 tablet 3   losartan-hydrochlorothiazide (HYZAAR) 100-12.5 MG tablet TAKE 1 TABLET BY MOUTH DAILY 90 tablet 1   lidocaine (LIDODERM) 5 % Place 1 patch onto the skin daily. Remove & Discard patch within 12 hours or as directed by MD (Patient not taking: Reported on 11/04/2021) 30 patch 11   nystatin cream (MYCOSTATIN) Apply 1 application topically 2 (two) times daily. (Patient not taking: Reported on 05/03/2021) 30 g 0   Oxcarbazepine (TRILEPTAL) 300 MG tablet Take 150 mg by mouth daily. (Patient not taking: Reported on 11/04/2021)     No current facility-administered medications for this visit.     Allergies:   Patient has no known allergies.   Social History:  The patient  reports that she has never smoked. She has never used smokeless tobacco. She reports current alcohol use. She reports that she does not use drugs.   Family History:   family history includes Breast cancer in her paternal grandmother; Dementia in her mother; Hypertension in her father and mother; Stroke in her father.    Review of Systems: Review of Systems  Constitutional: Negative.   HENT: Negative.         Pain right side of her head  Respiratory: Negative.    Cardiovascular: Negative.   Gastrointestinal: Negative.   Musculoskeletal: Negative.   Neurological: Negative.        Balance issues  Psychiatric/Behavioral: Negative.    All other systems reviewed and are negative.  PHYSICAL EXAM: VS:  Ht 5' (1.524 m)    Wt 194 lb 6 oz (88.2 kg)    SpO2 98%    BMI 37.96 kg/m  , BMI Body mass  index is 37.96 kg/m. Constitutional:  oriented to person, place, and time. No distress.  HENT:  Head: Grossly normal Eyes:  no discharge. No scleral icterus.  Neck: No JVD, no carotid bruits  Cardiovascular: Regular rate and rhythm, no murmurs appreciated Pulmonary/Chest: Clear to auscultation bilaterally, no wheezes or rails Abdominal: Soft.  no distension.  no tenderness.  Musculoskeletal: Normal range of motion Neurological:  normal muscle tone. Coordination normal. No atrophy Skin: Skin warm and dry Psychiatric: normal affect, pleasant   Recent Labs: 03/19/2021: ALT 32; BUN 19; Creatinine, Ser 1.07; Potassium 3.9; Sodium 138   Lipid Panel Lab Results  Component Value Date   CHOL 166 03/19/2021   HDL 51.80 03/19/2021   LDLCALC 89 03/19/2021   TRIG 126.0 03/19/2021      Wt Readings from Last 3 Encounters:  11/04/21 194 lb 6 oz (88.2 kg)  05/03/21 188 lb (85.3 kg)  03/19/21 190 lb (86.2 kg)  ASSESSMENT AND PLAN:  Essential hypertension Recommend she closely monitor blood pressure at home Running high today in the office, needs close monitoring  Hypercholesterolemia Encouraged her to stay on her Lipitor Low carbohydrate diet ,weight loss recommended  Morbid obesity ([place]) Previously on Nutrisystem We have encouraged continued exercise, careful diet management in an effort to lose weight.  Chronic low back pain Requesting meloxicam 1 pill every 3 to 4 days.  I suspect this might be fine Suggest we talk with primary care She is on NOAC  Atrial fibrillation with RVR ([place]) - Plan: EKG 12-Lead On Eliquis, beta-blocker, does not want to restore normal sinus rhythm, reports she is asymptomatic Long discussion concerning risk and benefit of atrial fibrillation For now she is declining any intervention  Trigeminal neuralgia of left side of face Followed on annual basis with MRI   Total encounter time more than 40 minutes  Greater than 50% was spent in counseling  and coordination of care with the patient     Orders Placed This Encounter  Procedures   EKG 12-Lead     Signed, Esmond Plants, M.D., Ph.D. 11/04/2021  [place], [place]

## 2021-11-04 ENCOUNTER — Other Ambulatory Visit: Payer: Self-pay

## 2021-11-04 ENCOUNTER — Ambulatory Visit: Payer: PPO | Admitting: Cardiovascular Disease

## 2021-11-04 ENCOUNTER — Encounter: Payer: Self-pay | Admitting: Cardiovascular Disease

## 2021-11-04 VITALS — Ht 60.0 in | Wt 194.4 lb

## 2021-11-04 DIAGNOSIS — E782 Mixed hyperlipidemia: Secondary | ICD-10-CM | POA: Diagnosis not present

## 2021-11-04 DIAGNOSIS — N183 Chronic kidney disease, stage 3 unspecified: Secondary | ICD-10-CM | POA: Diagnosis not present

## 2021-11-04 DIAGNOSIS — I480 Paroxysmal atrial fibrillation: Secondary | ICD-10-CM | POA: Diagnosis not present

## 2021-11-04 DIAGNOSIS — I10 Essential (primary) hypertension: Secondary | ICD-10-CM

## 2021-11-04 MED ORDER — FUROSEMIDE 20 MG PO TABS
20.0000 mg | ORAL_TABLET | Freq: Every day | ORAL | 3 refills | Status: DC | PRN
Start: 2021-11-04 — End: 2023-05-11

## 2021-11-04 NOTE — Patient Instructions (Addendum)
Medication Instructions:  ?Stop amiodarone ? ?Please start ?Lasix 20 mg daily as needed (sparingly) for leg swelling ? ?If you need a refill on your cardiac medications before your next appointment, please call your pharmacy.  ? ?Lab work: ?No new labs needed ? ?Testing/Procedures: ?No new testing needed ? ?Follow-Up: ?At [facility], you and your health needs are our priority.  As part of our continuing mission to provide you with exceptional heart care, we have created designated Provider Care Teams.  These Care Teams include your primary Cardiologist (physician) and Advanced Practice Providers (APPs -  Physician Assistants and Nurse Practitioners) who all work together to provide you with the care you need, when you need it. ? ?You will need a follow up appointment in 6 months ? ?Providers on your designated Care Team:   ?Murray Hodgkins, NP ?Christell Faith, PA-C ?Cadence Kathlen Mody, PA-C ? ?COVID-19 Vaccine Information can be found at: ShippingScam.co.uk For questions related to vaccine distribution or appointments, please email [email or web address] or call [phone].  ? ?

## 2021-11-17 ENCOUNTER — Other Ambulatory Visit: Payer: Self-pay | Admitting: Cardiovascular Disease

## 2021-11-25 ENCOUNTER — Other Ambulatory Visit: Payer: Self-pay | Admitting: Cardiovascular Disease

## 2021-11-28 ENCOUNTER — Telehealth: Payer: Self-pay | Admitting: Internal Medicine

## 2021-11-28 NOTE — Telephone Encounter (Signed)
Pt called stating she need a prescription for Meloxicam 15 mg. Pt stated she spoke to provider about it before and she just need enough to take until her physical ?

## 2021-11-28 NOTE — Telephone Encounter (Signed)
Advised that per Dr Jana Half to prescribe meloxicam due to decreased kidney function. Also overdue f/u with Dr Nicki Reaper. Scheduled with Dr Nicki Reaper to discuss in the morning. ?

## 2021-11-29 ENCOUNTER — Ambulatory Visit (INDEPENDENT_AMBULATORY_CARE_PROVIDER_SITE_OTHER): Payer: PPO | Admitting: Internal Medicine

## 2021-11-29 VITALS — BP 132/78 | HR 100 | Temp 97.6°F | Resp 16 | Ht 60.0 in | Wt 195.2 lb

## 2021-11-29 DIAGNOSIS — G500 Trigeminal neuralgia: Secondary | ICD-10-CM | POA: Diagnosis not present

## 2021-11-29 DIAGNOSIS — D329 Benign neoplasm of meninges, unspecified: Secondary | ICD-10-CM

## 2021-11-29 DIAGNOSIS — E7800 Pure hypercholesterolemia, unspecified: Secondary | ICD-10-CM

## 2021-11-29 DIAGNOSIS — I4891 Unspecified atrial fibrillation: Secondary | ICD-10-CM

## 2021-11-29 DIAGNOSIS — I739 Peripheral vascular disease, unspecified: Secondary | ICD-10-CM | POA: Diagnosis not present

## 2021-11-29 DIAGNOSIS — Z1159 Encounter for screening for other viral diseases: Secondary | ICD-10-CM | POA: Diagnosis not present

## 2021-11-29 DIAGNOSIS — R739 Hyperglycemia, unspecified: Secondary | ICD-10-CM

## 2021-11-29 DIAGNOSIS — M2550 Pain in unspecified joint: Secondary | ICD-10-CM

## 2021-11-29 DIAGNOSIS — I10 Essential (primary) hypertension: Secondary | ICD-10-CM

## 2021-11-29 DIAGNOSIS — D6869 Other thrombophilia: Secondary | ICD-10-CM

## 2021-11-29 DIAGNOSIS — M4800 Spinal stenosis, site unspecified: Secondary | ICD-10-CM | POA: Diagnosis not present

## 2021-11-29 DIAGNOSIS — D582 Other hemoglobinopathies: Secondary | ICD-10-CM

## 2021-11-29 LAB — CBC WITH DIFFERENTIAL/PLATELET
Basophils Absolute: 0.1 10*3/uL (ref 0.0–0.1)
Basophils Relative: 1.2 % (ref 0.0–3.0)
Eosinophils Absolute: 0.1 10*3/uL (ref 0.0–0.7)
Eosinophils Relative: 2.4 % (ref 0.0–5.0)
HCT: 47.8 % — ABNORMAL HIGH (ref 36.0–46.0)
Hemoglobin: 16.2 g/dL — ABNORMAL HIGH (ref 12.0–15.0)
Lymphocytes Relative: 26.6 % (ref 12.0–46.0)
Lymphs Abs: 1.5 10*3/uL (ref 0.7–4.0)
MCHC: 33.9 g/dL (ref 30.0–36.0)
MCV: 94 fl (ref 78.0–100.0)
Monocytes Absolute: 0.6 10*3/uL (ref 0.1–1.0)
Monocytes Relative: 10.3 % (ref 3.0–12.0)
Neutro Abs: 3.4 10*3/uL (ref 1.4–7.7)
Neutrophils Relative %: 59.5 % (ref 43.0–77.0)
Platelets: 217 10*3/uL (ref 150.0–400.0)
RBC: 5.08 Mil/uL (ref 3.87–5.11)
RDW: 13.7 % (ref 11.5–15.5)
WBC: 5.7 10*3/uL (ref 4.0–10.5)

## 2021-11-29 LAB — HEPATIC FUNCTION PANEL
ALT: 22 U/L (ref 0–35)
AST: 22 U/L (ref 0–37)
Albumin: 4.4 g/dL (ref 3.5–5.2)
Alkaline Phosphatase: 113 U/L (ref 39–117)
Bilirubin, Direct: 0.2 mg/dL (ref 0.0–0.3)
Total Bilirubin: 1 mg/dL (ref 0.2–1.2)
Total Protein: 7.2 g/dL (ref 6.0–8.3)

## 2021-11-29 LAB — BASIC METABOLIC PANEL
BUN: 32 mg/dL — ABNORMAL HIGH (ref 6–23)
CO2: 25 mEq/L (ref 19–32)
Calcium: 9.9 mg/dL (ref 8.4–10.5)
Chloride: 105 mEq/L (ref 96–112)
Creatinine, Ser: 1.38 mg/dL — ABNORMAL HIGH (ref 0.40–1.20)
GFR: 39.92 mL/min — ABNORMAL LOW (ref >60.00)
Glucose, Bld: 146 mg/dL — ABNORMAL HIGH (ref 70–99)
Potassium: 4.2 mEq/L (ref 3.5–5.1)
Sodium: 140 mEq/L (ref 135–145)

## 2021-11-29 LAB — TSH: TSH: 3.52 u[IU]/mL (ref 0.35–5.50)

## 2021-11-29 LAB — LIPID PANEL
Cholesterol: 161 mg/dL (ref 0–200)
HDL: 46.9 mg/dL (ref >39.00)
LDL Cholesterol: 95 mg/dL (ref 0–99)
NonHDL: 114.06
Total CHOL/HDL Ratio: 3
Triglycerides: 97 mg/dL (ref 0.0–149.0)
VLDL: 19.4 mg/dL (ref 0.0–40.0)

## 2021-11-29 LAB — HEMOGLOBIN A1C: Hgb A1c MFr Bld: 6.3 % (ref 4.6–6.5)

## 2021-11-29 MED ORDER — MELOXICAM 7.5 MG PO TABS: 7.5000 mg | ORAL_TABLET | Freq: Every day | ORAL | 0 refills | Status: DC | PRN

## 2021-11-29 NOTE — Progress Notes (Signed)
Patient ID: Kristin James, female   DOB: 25-Apr-1956, 66 y.o.   MRN: [id number] ? ? ?Subjective:  ? ? Patient ID: Kristin James, female    DOB: 11-Aug-1956, 66 y.o.   MRN: [id number] ? ?This visit occurred during the SARS-CoV-2 public health emergency.  Safety protocols were in place, including screening questions prior to the visit, additional usage of staff PPE, and extensive cleaning of exam room while observing appropriate contact time as indicated for disinfecting solutions.  ? ?Patient here for work in appt.  ? ?Chief Complaint  ?Patient presents with  ? Hypertension  ? Hyperlipidemia  ? discuss meloxicam  ? .  ? ?HPI ?Called in wanting refill meloxicam.  Appt scheduled to discuss and to f/u on her blood pressure and cholesterol.  Saw Dr Rockey Situ 11/04/21 - f/u afib.  No changes made.  Sees Dr Candiss Norse for f/u CKD.  Stable.  Recommend avoiding antiinflammatories.  Sees neurology - f/u trigeminal neuralgia and s/p gamma knife treatment - meningioma.  She reports increased pain and stiffness - limits her activity.  Meloxicam works well for her.  Does not need daily.  She has tried gabapentin and tramadol without relief.  Takes tylenol scheduled.  Needs the antiinflammatory to function.  No chest pain or sob.  No cough or congestion.  No acid reflux.   ? ? ?Past Medical History:  ?Diagnosis Date  ? Afib ([place])   ? Cellulitis of right arm   ? Embolus to lower extremity ([place])   ? a. 05/2014 s/p thrombolysis of LLE.  ? Hiatal hernia   ? History of echocardiogram   ? a. 07/2020 Echo: EF 55-60%, no rwma, Nl RV size/fxn. Mildly dil LA. Mild MR/MS. Sev mitral annular Ca2+. Mild Ao sclerosis w/o stenosis.  ? History of stress test   ? a. 05/2014 lexiscan MV: EF 67%, no ischemia/infarct.  ? Hypercholesteremia   ? Hyperglycemia   ? Hypertension   ? Left Cerebellopontine Angle Meningioma ([place])   ? a. 10/2016 s/p Gamma knife.  ? Osteoarthritis   ? PAF (paroxysmal atrial fibrillation) ([place])   ? a. Dx 10/2014->converted on amio. CHA2DS2VASc =  3-4-->Eliquis.  ? Proteinuria   ? Shingles   ? dec 2021  ? Spinal stenosis at L4-L5 level   ? Thrombophlebitis of posterior tibial vein ([place]) 06/01/2014  ? Trigeminal neuralgia 08/2016  ? ?Past Surgical History:  ?Procedure Laterality Date  ? AORTOGRAM    ? BACK SURGERY  11/13/2014  ? CARDIOVERSION N/A 07/19/2020  ? Procedure: CARDIOVERSION;  Surgeon: Minna Merritts, MD;  Location: [facility] ORS;  Service: Cardiovascular;  Laterality: N/A;  ? CHOLECYSTECTOMY    ? COLONOSCOPY WITH PROPOFOL N/A 08/15/2016  ? Procedure: COLONOSCOPY WITH PROPOFOL;  Surgeon: Lollie Sails, MD;  Location: [facility] ENDOSCOPY;  Service: Endoscopy;  Laterality: N/A;  ? KNEE ARTHROSCOPY    ? LOWER EXTREMITY ANGIOGRAM    ? ?Family History  ?Problem Relation Age of Onset  ? Hypertension Mother   ? Dementia Mother   ? Stroke Father   ?     3 strokes  ? Hypertension Father   ? Breast cancer Paternal Grandmother   ? Colon cancer Neg Hx   ? ?Social History  ? ?Socioeconomic History  ? Marital status: Married  ?  Spouse name: Not on file  ? Number of children: 2  ? Years of education: Not on file  ? Highest education level: Not on file  ?Occupational History  ? Not on file  ?  Tobacco Use  ? Smoking status: Never  ? Smokeless tobacco: Never  ?Vaping Use  ? Vaping Use: Never used  ?Substance and Sexual Activity  ? Alcohol use: Yes  ?  Alcohol/week: 0.0 standard drinks  ?  Comment: occ. wine  ? Drug use: No  ? Sexual activity: Not on file  ?Other Topics Concern  ? Not on file  ?Social History Narrative  ? Right handed   ? Caffeine use: coffee (1/2 cup per day)  ? ?Social Determinants of Health  ? ?Financial Resource Strain: Not on file  ?Food Insecurity: Not on file  ?Transportation Needs: Not on file  ?Physical Activity: Not on file  ?Stress: Not on file  ?Social Connections: Not on file  ? ? ? ?Review of Systems  ?Constitutional:  Negative for appetite change and unexpected weight change.  ?HENT:  Negative for congestion and sinus pressure.    ?Respiratory:  Negative for cough, chest tightness and shortness of breath.   ?Cardiovascular:  Negative for chest pain, palpitations and leg swelling.  ?Gastrointestinal:  Negative for abdominal pain, diarrhea, nausea and vomiting.  ?Genitourinary:  Negative for difficulty urinating and dysuria.  ?Musculoskeletal:  Negative for joint swelling and myalgias.  ?Skin:  Negative for color change and rash.  ?Neurological:  Negative for dizziness, light-headedness and headaches.  ?Psychiatric/Behavioral:  Negative for agitation and dysphoric mood.   ? ?   ?Objective:  ?  ? ?BP 132/78   Pulse 100   Temp 97.6 ?F (36.4 ?C)   Resp 16   Ht 5' (1.524 m)   Wt 195 lb 3.2 oz (88.5 kg)   SpO2 98%   BMI 38.12 kg/m?  ?Wt Readings from Last 3 Encounters:  ?11/29/21 195 lb 3.2 oz (88.5 kg)  ?11/04/21 194 lb 6 oz (88.2 kg)  ?05/03/21 188 lb (85.3 kg)  ? ? ?Physical Exam ?Vitals reviewed.  ?Constitutional:   ?   General: She is not in acute distress. ?   Appearance: Normal appearance.  ?HENT:  ?   Head: Normocephalic and atraumatic.  ?   Right Ear: External ear normal.  ?   Left Ear: External ear normal.  ?Eyes:  ?   General: No scleral icterus.    ?   Right eye: No discharge.     ?   Left eye: No discharge.  ?   Conjunctiva/sclera: Conjunctivae normal.  ?Neck:  ?   Thyroid: No thyromegaly.  ?Cardiovascular:  ?   Rate and Rhythm: Normal rate.  ?   Comments: Irregularly irregular.  Rate controlled.   ?Pulmonary:  ?   Effort: No respiratory distress.  ?   Breath sounds: Normal breath sounds. No wheezing.  ?Abdominal:  ?   General: Bowel sounds are normal.  ?   Palpations: Abdomen is soft.  ?   Tenderness: There is no abdominal tenderness.  ?Musculoskeletal:     ?   General: No swelling or tenderness.  ?   Cervical back: Neck supple. No tenderness.  ?Lymphadenopathy:  ?   Cervical: No cervical adenopathy.  ?Skin: ?   Findings: No erythema or rash.  ?Neurological:  ?   Mental Status: She is alert.  ?Psychiatric:     ?   Mood and  Affect: Mood normal.     ?   Behavior: Behavior normal.  ? ? ? ?Outpatient Encounter Medications as of 11/29/2021  ?Medication Sig  ? meloxicam (MOBIC) 7.5 MG tablet Take 1 tablet (7.5 mg total) by mouth daily as  needed for pain.  ? acetaminophen (TYLENOL) 650 MG CR tablet Take by mouth.  ? amitriptyline (ELAVIL) 25 MG tablet TAKE 1 TABLET(25 MG) BY MOUTH AT BEDTIME.  ? amLODipine (NORVASC) 5 MG tablet TAKE 1 TABLET(5 MG) BY MOUTH TWICE DAILY  ? atenolol (TENORMIN) 25 MG tablet TAKE 1 TABLET BY MOUTH EVERY DAY  ? atorvastatin (LIPITOR) 40 MG tablet TAKE 1 TABLET BY MOUTH EVERY DAY  ? ELIQUIS 5 MG TABS tablet TAKE 1 TABLET BY MOUTH TWICE DAILY  ? furosemide (LASIX) 20 MG tablet Take 1 tablet (20 mg total) by mouth daily as needed. Sparingly for leg swelling  ? lamoTRIgine (LAMICTAL) 100 MG tablet TAKE 1 TABLET(100 MG) BY MOUTH TWICE DAILY  ? losartan-hydrochlorothiazide (HYZAAR) 100-12.5 MG tablet TAKE 1 TABLET BY MOUTH DAILY  ? [DISCONTINUED] lidocaine (LIDODERM) 5 % Place 1 patch onto the skin daily. Remove & Discard patch within 12 hours or as directed by MD (Patient not taking: Reported on 11/04/2021)  ? [DISCONTINUED] nystatin cream (MYCOSTATIN) Apply 1 application topically 2 (two) times daily. (Patient not taking: Reported on 05/03/2021)  ? [DISCONTINUED] Oxcarbazepine (TRILEPTAL) 300 MG tablet Take 150 mg by mouth daily. (Patient not taking: Reported on 11/04/2021)  ? ?No facility-administered encounter medications on file as of 11/29/2021.  ?  ? ?Lab Results  ?Component Value Date  ? WBC 5.7 11/29/2021  ? HGB 16.2 (H) 11/29/2021  ? HCT 47.8 (H) 11/29/2021  ? PLT 217.0 11/29/2021  ? GLUCOSE 146 (H) 11/29/2021  ? CHOL 161 11/29/2021  ? TRIG 97.0 11/29/2021  ? HDL 46.90 11/29/2021  ? [place] 95 11/29/2021  ? ALT 22 11/29/2021  ? AST 22 11/29/2021  ? NA 140 11/29/2021  ? K 4.2 11/29/2021  ? CL 105 11/29/2021  ? CREATININE 1.38 (H) 11/29/2021  ? BUN 32 (H) 11/29/2021  ? CO2 25 11/29/2021  ? TSH 3.52 11/29/2021  ? INR 1.5  (H) 06/03/2020  ? HGBA1C 6.3 11/29/2021  ? MICROALBUR 35.5 (H) 04/08/2016  ? ? ?MM 3D SCREEN BREAST BILATERAL ? ?Result Date: 07/10/2021 ?CLINICAL DATA:  Screening. EXAM: DIGITAL SCREENING BILATERAL MAMMOGRAM WITH TOMOSYNT

## 2021-12-01 ENCOUNTER — Encounter: Payer: Self-pay | Admitting: Internal Medicine

## 2021-12-01 DIAGNOSIS — D6869 Other thrombophilia: Secondary | ICD-10-CM | POA: Insufficient documentation

## 2021-12-01 NOTE — Assessment & Plan Note (Signed)
afib on eliquis.  

## 2021-12-01 NOTE — Assessment & Plan Note (Signed)
Increased joint pain as outlined.  Limits activity.  Becoming a quality of life issue.  Discussed risk and side effects of antiinflammatories.  Discussed with nephrology.  Gentle use of low dose meloxicam - 7.'5mg'$ .  Follow kidney function closely.   ?

## 2021-12-01 NOTE — Assessment & Plan Note (Signed)
No pain.  Off gabapentin.  Follow.  ?

## 2021-12-01 NOTE — Assessment & Plan Note (Signed)
Low-carb diet and exercise.  Follow met b and A1c. ?

## 2021-12-01 NOTE — Assessment & Plan Note (Signed)
Follow cbc.  

## 2021-12-01 NOTE — Assessment & Plan Note (Signed)
Being followed by neurology.  Status post gamma knife treatment.  No pain currently.  Seeing Dr. Sater.  Currently on Lamictal and amitriptyline. 

## 2021-12-01 NOTE — Assessment & Plan Note (Signed)
On amiodarone and atenolol.  Continue Eliquis.  Stable. 

## 2021-12-01 NOTE — Assessment & Plan Note (Signed)
S/p surgery.  Previously followed by Dr Carloyn Manner.  Describes "hurting all over".  Gentle use of mobic as outlined.  Follow.  ?

## 2021-12-01 NOTE — Assessment & Plan Note (Signed)
Blood pressure as outlined.  Continue losartan/HCTZ, Tenormin and amlodipine.  Follow pressures.  Follow metabolic panel. ?

## 2021-12-01 NOTE — Assessment & Plan Note (Signed)
Status post angioplasty.  On Eliquis.  Continue risk factor modification.  Continue statin. 

## 2021-12-01 NOTE — Assessment & Plan Note (Signed)
Continue Lipitor.  Low-cholesterol diet and exercise.  Follow lipid panel liver function test. 

## 2021-12-02 ENCOUNTER — Telehealth: Payer: Self-pay | Admitting: Internal Medicine

## 2021-12-02 ENCOUNTER — Telehealth: Payer: Self-pay

## 2021-12-02 DIAGNOSIS — N183 Chronic kidney disease, stage 3 unspecified: Secondary | ICD-10-CM

## 2021-12-02 LAB — HEPATITIS C ANTIBODY
Hepatitis C Ab: NONREACTIVE
SIGNAL TO CUT-OFF: <0.02 (ref <1.00)

## 2021-12-02 NOTE — Telephone Encounter (Signed)
Copied from [place] [phone]. Topic: Medicare AWV ?>> Dec 02, 2021  9:53 AM Harris-Coley, Hannah Beat wrote: ?Reason for CRM: Left message for patient to schedule Annual Wellness Visit.  Please schedule with Nurse Health Advisor Denisa O'Brien-Blaney, LPN at [facility].  Please call [phone] ask for Juliann Pulse ?

## 2021-12-02 NOTE — Telephone Encounter (Signed)
LM to schedule non fasting labs in 3-4 weeks. Please schedule ?

## 2021-12-23 ENCOUNTER — Other Ambulatory Visit: Payer: Self-pay | Admitting: Internal Medicine

## 2021-12-23 ENCOUNTER — Other Ambulatory Visit: Payer: Self-pay | Admitting: Cardiovascular Disease

## 2021-12-24 ENCOUNTER — Other Ambulatory Visit (INDEPENDENT_AMBULATORY_CARE_PROVIDER_SITE_OTHER): Payer: PPO

## 2021-12-24 DIAGNOSIS — N183 Chronic kidney disease, stage 3 unspecified: Secondary | ICD-10-CM

## 2021-12-24 LAB — BASIC METABOLIC PANEL
BUN: 38 mg/dL — ABNORMAL HIGH (ref 6–23)
CO2: 27 mEq/L (ref 19–32)
Calcium: 9.4 mg/dL (ref 8.4–10.5)
Chloride: 102 mEq/L (ref 96–112)
Creatinine, Ser: 1.32 mg/dL — ABNORMAL HIGH (ref 0.40–1.20)
GFR: 42.08 mL/min — ABNORMAL LOW (ref >60.00)
Glucose, Bld: 123 mg/dL — ABNORMAL HIGH (ref 70–99)
Potassium: 4.2 mEq/L (ref 3.5–5.1)
Sodium: 139 mEq/L (ref 135–145)

## 2021-12-24 LAB — CBC WITH DIFFERENTIAL/PLATELET
Basophils Absolute: 0.1 10*3/uL (ref 0.0–0.1)
Basophils Relative: 1 % (ref 0.0–3.0)
Eosinophils Absolute: 0.2 10*3/uL (ref 0.0–0.7)
Eosinophils Relative: 2.4 % (ref 0.0–5.0)
HCT: 48.8 % — ABNORMAL HIGH (ref 36.0–46.0)
Hemoglobin: 16.3 g/dL — ABNORMAL HIGH (ref 12.0–15.0)
Lymphocytes Relative: 21 % (ref 12.0–46.0)
Lymphs Abs: 1.8 10*3/uL (ref 0.7–4.0)
MCHC: 33.4 g/dL (ref 30.0–36.0)
MCV: 94.9 fl (ref 78.0–100.0)
Monocytes Absolute: 0.8 10*3/uL (ref 0.1–1.0)
Monocytes Relative: 9.1 % (ref 3.0–12.0)
Neutro Abs: 5.8 10*3/uL (ref 1.4–7.7)
Neutrophils Relative %: 66.5 % (ref 43.0–77.0)
Platelets: 228 10*3/uL (ref 150.0–400.0)
RBC: 5.14 Mil/uL — ABNORMAL HIGH (ref 3.87–5.11)
RDW: 13.8 % (ref 11.5–15.5)
WBC: 8.8 10*3/uL (ref 4.0–10.5)

## 2021-12-26 NOTE — Telephone Encounter (Signed)
Received request for refill of meloxicam.  Need to confirm how often she is taking.  Had previously discussed with her and she had stated - did not need often.  Have discussed the need to use least effective dose due to kidney function.  Just need to clarify before refill.  ?

## 2021-12-31 ENCOUNTER — Encounter: Payer: Self-pay | Admitting: Internal Medicine

## 2021-12-31 ENCOUNTER — Ambulatory Visit (INDEPENDENT_AMBULATORY_CARE_PROVIDER_SITE_OTHER): Payer: PPO | Admitting: Internal Medicine

## 2021-12-31 DIAGNOSIS — R739 Hyperglycemia, unspecified: Secondary | ICD-10-CM | POA: Diagnosis not present

## 2021-12-31 DIAGNOSIS — E7800 Pure hypercholesterolemia, unspecified: Secondary | ICD-10-CM

## 2021-12-31 DIAGNOSIS — I10 Essential (primary) hypertension: Secondary | ICD-10-CM

## 2021-12-31 DIAGNOSIS — D6869 Other thrombophilia: Secondary | ICD-10-CM | POA: Diagnosis not present

## 2021-12-31 DIAGNOSIS — I739 Peripheral vascular disease, unspecified: Secondary | ICD-10-CM

## 2021-12-31 DIAGNOSIS — N183 Chronic kidney disease, stage 3 unspecified: Secondary | ICD-10-CM

## 2021-12-31 DIAGNOSIS — I480 Paroxysmal atrial fibrillation: Secondary | ICD-10-CM | POA: Diagnosis not present

## 2021-12-31 DIAGNOSIS — D329 Benign neoplasm of meninges, unspecified: Secondary | ICD-10-CM

## 2021-12-31 MED ORDER — LOSARTAN POTASSIUM 100 MG PO TABS: 100.0000 mg | ORAL_TABLET | Freq: Every day | ORAL | 1 refills | Status: DC

## 2021-12-31 NOTE — Patient Instructions (Signed)
Stop losartan/hctz and start losartan '100mg'$  per day.   ?

## 2021-12-31 NOTE — Progress Notes (Signed)
Patient ID: DEIDRA SPEASE, female   DOB: 16-May-1956, 66 y.o.   MRN: [id number] ? ? ?Subjective:  ? ? Patient ID: IZELA ALTIER, female    DOB: 1956-02-06, 66 y.o.   MRN: [id number] ? ?This visit occurred during the SARS-CoV-2 public health emergency.  Safety protocols were in place, including screening questions prior to the visit, additional usage of staff PPE, and extensive cleaning of exam room while observing appropriate contact time as indicated for disinfecting solutions.  ? ?Patient here for a scheduled follow up.  ? ?Chief Complaint  ?Patient presents with  ? Follow-up  ?  Follow up on hypertension  ? .  ? ?HPI ?Reports she is doing relatively well.  Discussed current blood pressure medication.  Discussed changing her medication regimen.  Reports blood pressures doing well.  With CKD.  Discussed trial off hctz and follow kidney function.  No chest pain or sob reported.  No abdominal pain or bowel change reported.   ? ? ?Past Medical History:  ?Diagnosis Date  ? Afib ([place])   ? Cellulitis of right arm   ? Embolus to lower extremity ([place])   ? a. 05/2014 s/p thrombolysis of LLE.  ? Hiatal hernia   ? History of echocardiogram   ? a. 07/2020 Echo: EF 55-60%, no rwma, Nl RV size/fxn. Mildly dil LA. Mild MR/MS. Sev mitral annular Ca2+. Mild Ao sclerosis w/o stenosis.  ? History of stress test   ? a. 05/2014 lexiscan MV: EF 67%, no ischemia/infarct.  ? Hypercholesteremia   ? Hyperglycemia   ? Hypertension   ? Left Cerebellopontine Angle Meningioma ([place])   ? a. 10/2016 s/p Gamma knife.  ? Osteoarthritis   ? PAF (paroxysmal atrial fibrillation) ([place])   ? a. Dx 10/2014->converted on amio. CHA2DS2VASc = 3-4-->Eliquis.  ? Proteinuria   ? Shingles   ? dec 2021  ? Spinal stenosis at L4-L5 level   ? Thrombophlebitis of posterior tibial vein ([place]) 06/01/2014  ? Trigeminal neuralgia 08/2016  ? ?Past Surgical History:  ?Procedure Laterality Date  ? AORTOGRAM    ? BACK SURGERY  11/13/2014  ? CARDIOVERSION N/A 07/19/2020  ? Procedure:  CARDIOVERSION;  Surgeon: Minna Merritts, MD;  Location: [facility] ORS;  Service: Cardiovascular;  Laterality: N/A;  ? CHOLECYSTECTOMY    ? COLONOSCOPY WITH PROPOFOL N/A 08/15/2016  ? Procedure: COLONOSCOPY WITH PROPOFOL;  Surgeon: Lollie Sails, MD;  Location: [facility] ENDOSCOPY;  Service: Endoscopy;  Laterality: N/A;  ? KNEE ARTHROSCOPY    ? LOWER EXTREMITY ANGIOGRAM    ? ?Family History  ?Problem Relation Age of Onset  ? Hypertension Mother   ? Dementia Mother   ? Stroke Father   ?     3 strokes  ? Hypertension Father   ? Breast cancer Paternal Grandmother   ? Colon cancer Neg Hx   ? ?Social History  ? ?Socioeconomic History  ? Marital status: Married  ?  Spouse name: Not on file  ? Number of children: 2  ? Years of education: Not on file  ? Highest education level: Not on file  ?Occupational History  ? Not on file  ?Tobacco Use  ? Smoking status: Never  ? Smokeless tobacco: Never  ?Vaping Use  ? Vaping Use: Never used  ?Substance and Sexual Activity  ? Alcohol use: Yes  ?  Alcohol/week: 0.0 standard drinks  ?  Comment: occ. wine  ? Drug use: No  ? Sexual activity: Not on file  ?Other Topics Concern  ?  Not on file  ?Social History Narrative  ? Right handed   ? Caffeine use: coffee (1/2 cup per day)  ? ?Social Determinants of Health  ? ?Financial Resource Strain: Not on file  ?Food Insecurity: Not on file  ?Transportation Needs: Not on file  ?Physical Activity: Not on file  ?Stress: Not on file  ?Social Connections: Not on file  ? ? ? ?Review of Systems  ?Constitutional:  Negative for appetite change and unexpected weight change.  ?HENT:  Negative for congestion and sinus pressure.   ?Respiratory:  Negative for cough, chest tightness and shortness of breath.   ?Cardiovascular:  Negative for chest pain, palpitations and leg swelling.  ?Gastrointestinal:  Negative for abdominal pain, diarrhea, nausea and vomiting.  ?Genitourinary:  Negative for difficulty urinating and dysuria.  ?Musculoskeletal:  Negative for  myalgias.  ?     Joint pain as outlined.   ?Skin:  Negative for color change and rash.  ?Neurological:  Negative for dizziness, light-headedness and headaches.  ?Psychiatric/Behavioral:  Negative for agitation and dysphoric mood.   ? ?   ?Objective:  ?  ? ?BP 130/86 (BP Location: Left Arm, Patient Position: Sitting, Cuff Size: Large)   Pulse 82   Temp 98 ?F (36.7 ?C) (Oral)   Ht 5' (1.524 m)   Wt 191 lb 6.4 oz (86.8 kg)   SpO2 97%   BMI 37.38 kg/m?  ?Wt Readings from Last 3 Encounters:  ?12/31/21 191 lb 6.4 oz (86.8 kg)  ?11/29/21 195 lb 3.2 oz (88.5 kg)  ?11/04/21 194 lb 6 oz (88.2 kg)  ? ? ?Physical Exam ?Vitals reviewed.  ?Constitutional:   ?   General: She is not in acute distress. ?   Appearance: Normal appearance.  ?HENT:  ?   Head: Normocephalic and atraumatic.  ?   Right Ear: External ear normal.  ?   Left Ear: External ear normal.  ?Eyes:  ?   General: No scleral icterus.    ?   Right eye: No discharge.     ?   Left eye: No discharge.  ?   Conjunctiva/sclera: Conjunctivae normal.  ?Neck:  ?   Thyroid: No thyromegaly.  ?Cardiovascular:  ?   Rate and Rhythm: Normal rate and regular rhythm.  ?Pulmonary:  ?   Effort: No respiratory distress.  ?   Breath sounds: Normal breath sounds. No wheezing.  ?Abdominal:  ?   General: Bowel sounds are normal.  ?   Palpations: Abdomen is soft.  ?   Tenderness: There is no abdominal tenderness.  ?Musculoskeletal:     ?   General: No swelling or tenderness.  ?   Cervical back: Neck supple. No tenderness.  ?Lymphadenopathy:  ?   Cervical: No cervical adenopathy.  ?Skin: ?   Findings: No erythema or rash.  ?Neurological:  ?   Mental Status: She is alert.  ?Psychiatric:     ?   Mood and Affect: Mood normal.     ?   Behavior: Behavior normal.  ? ? ? ?Outpatient Encounter Medications as of 12/31/2021  ?Medication Sig  ? acetaminophen (TYLENOL) 650 MG CR tablet Take by mouth.  ? amitriptyline (ELAVIL) 25 MG tablet TAKE 1 TABLET(25 MG) BY MOUTH AT BEDTIME.  ? amLODipine (NORVASC)  5 MG tablet TAKE 1 TABLET(5 MG) BY MOUTH TWICE DAILY  ? atenolol (TENORMIN) 25 MG tablet TAKE 1 TABLET BY MOUTH EVERY DAY  ? atorvastatin (LIPITOR) 40 MG tablet TAKE 1 TABLET BY MOUTH EVERY DAY  ?  ELIQUIS 5 MG TABS tablet TAKE 1 TABLET BY MOUTH TWICE DAILY  ? furosemide (LASIX) 20 MG tablet Take 1 tablet (20 mg total) by mouth daily as needed. Sparingly for leg swelling  ? lamoTRIgine (LAMICTAL) 100 MG tablet TAKE 1 TABLET(100 MG) BY MOUTH TWICE DAILY  ? losartan (COZAAR) 100 MG tablet Take 1 tablet (100 mg total) by mouth daily.  ? meloxicam (MOBIC) 7.5 MG tablet Take 1 tablet (7.5 mg total) by mouth daily as needed for pain.  ? [DISCONTINUED] losartan-hydrochlorothiazide (HYZAAR) 100-12.5 MG tablet TAKE 1 TABLET BY MOUTH DAILY  ? ?No facility-administered encounter medications on file as of 12/31/2021.  ?  ? ?Lab Results  ?Component Value Date  ? WBC 8.8 12/24/2021  ? HGB 16.3 (H) 12/24/2021  ? HCT 48.8 (H) 12/24/2021  ? PLT 228.0 12/24/2021  ? GLUCOSE 123 (H) 12/24/2021  ? CHOL 161 11/29/2021  ? TRIG 97.0 11/29/2021  ? HDL 46.90 11/29/2021  ? [place] 95 11/29/2021  ? ALT 22 11/29/2021  ? AST 22 11/29/2021  ? NA 139 12/24/2021  ? K 4.2 12/24/2021  ? CL 102 12/24/2021  ? CREATININE 1.32 (H) 12/24/2021  ? BUN 38 (H) 12/24/2021  ? CO2 27 12/24/2021  ? TSH 3.52 11/29/2021  ? INR 1.5 (H) 06/03/2020  ? HGBA1C 6.3 11/29/2021  ? MICROALBUR 35.5 (H) 04/08/2016  ? ? ?MM 3D SCREEN BREAST BILATERAL ? ?Result Date: 07/10/2021 ?CLINICAL DATA:  Screening. EXAM: DIGITAL SCREENING BILATERAL MAMMOGRAM WITH TOMOSYNTHESIS AND CAD TECHNIQUE: Bilateral screening digital craniocaudal and mediolateral oblique mammograms were obtained. Bilateral screening digital breast tomosynthesis was performed. The images were evaluated with computer-aided detection. COMPARISON:  Previous exam(s). ACR Breast Density Category b: There are scattered areas of fibroglandular density. FINDINGS: There are no findings suspicious for malignancy. IMPRESSION: No  mammographic evidence of malignancy. A result letter of this screening mammogram will be mailed directly to the patient. RECOMMENDATION: Screening mammogram in one year. (Code:SM-B-01Y) BI-RADS CATEGORY  1: Negative

## 2022-01-03 NOTE — Telephone Encounter (Signed)
Will refill when needs.   ?

## 2022-01-11 ENCOUNTER — Encounter: Payer: Self-pay | Admitting: Internal Medicine

## 2022-01-11 NOTE — Assessment & Plan Note (Signed)
On amiodarone and atenolol.  Continue Eliquis.  Stable. 

## 2022-01-11 NOTE — Assessment & Plan Note (Signed)
Avoid anti-inflammatories.  Stay hydrated.  Follow metabolic panel. Change losartan/hctz to losartan '100mg'$  q day.  Follow metabolic panel  ?

## 2022-01-11 NOTE — Assessment & Plan Note (Signed)
Low-carb diet and exercise.  Follow met b and A1c. ?

## 2022-01-11 NOTE — Assessment & Plan Note (Signed)
Blood pressure as outlined.  Continue Tenormin and amlodipine.  Change losartan/hctz to losartan '100mg'$  q day. Follow pressures.  Follow metabolic panel. ?

## 2022-01-11 NOTE — Assessment & Plan Note (Signed)
afib on eliquis.  

## 2022-01-11 NOTE — Assessment & Plan Note (Signed)
Status post angioplasty.  On Eliquis.  Continue risk factor modification.  Continue statin. 

## 2022-01-11 NOTE — Assessment & Plan Note (Signed)
Being followed by neurology.  Status post gamma knife treatment.  No pain currently.  Seeing Dr. Sater.  Currently on Lamictal and amitriptyline. 

## 2022-01-11 NOTE — Assessment & Plan Note (Signed)
Continue Lipitor.  Low-cholesterol diet and exercise.  Follow lipid panel liver function test. 

## 2022-01-28 ENCOUNTER — Other Ambulatory Visit (INDEPENDENT_AMBULATORY_CARE_PROVIDER_SITE_OTHER): Payer: PPO

## 2022-01-28 DIAGNOSIS — N183 Chronic kidney disease, stage 3 unspecified: Secondary | ICD-10-CM | POA: Diagnosis not present

## 2022-01-28 LAB — BASIC METABOLIC PANEL
BUN: 34 mg/dL — ABNORMAL HIGH (ref 6–23)
CO2: 27 mEq/L (ref 19–32)
Calcium: 9.4 mg/dL (ref 8.4–10.5)
Chloride: 105 mEq/L (ref 96–112)
Creatinine, Ser: 1.44 mg/dL — ABNORMAL HIGH (ref 0.40–1.20)
GFR: 37.89 mL/min — ABNORMAL LOW (ref >60.00)
Glucose, Bld: 95 mg/dL (ref 70–99)
Potassium: 4.5 mEq/L (ref 3.5–5.1)
Sodium: 140 mEq/L (ref 135–145)

## 2022-01-31 ENCOUNTER — Other Ambulatory Visit: Payer: Self-pay

## 2022-01-31 DIAGNOSIS — N183 Chronic kidney disease, stage 3 unspecified: Secondary | ICD-10-CM

## 2022-02-06 ENCOUNTER — Telehealth: Payer: Self-pay | Admitting: Internal Medicine

## 2022-02-06 NOTE — Telephone Encounter (Signed)
Spoke with patient she declined AWV do not call  

## 2022-02-20 ENCOUNTER — Other Ambulatory Visit: Payer: Self-pay | Admitting: Neurology

## 2022-02-20 ENCOUNTER — Telehealth: Payer: Self-pay | Admitting: Neurology

## 2022-02-20 MED ORDER — LAMOTRIGINE 100 MG PO TABS: ORAL_TABLET | ORAL | 0 refills | Status: DC

## 2022-02-20 NOTE — Telephone Encounter (Signed)
Pt called stating that she is about to run out of her Kristin James (LAMICTAL) 100 MG tablet due to her taking 2 a day and the pharmacy only giving her 30 pills for the month. Pt would like to know if a refill can be called in for her to the [facility] on [address].

## 2022-02-20 NOTE — Telephone Encounter (Signed)
Called the pt to clarify why the pharmacy only gave her 30 pills at her last fill. The last time we wrote for a script it was written for 180 tablet which is a 3 mth supply. Her insurance should be allowing her to get that and if not she still should have gotten 60 tablet advised the pt she needs to call the pharmacy and speak with someone about that. I have resent a new script to the pharmacy as well

## 2022-02-21 ENCOUNTER — Other Ambulatory Visit (INDEPENDENT_AMBULATORY_CARE_PROVIDER_SITE_OTHER): Payer: PPO

## 2022-02-21 ENCOUNTER — Other Ambulatory Visit: Payer: Self-pay | Admitting: Cardiovascular Disease

## 2022-02-21 DIAGNOSIS — N183 Chronic kidney disease, stage 3 unspecified: Secondary | ICD-10-CM | POA: Diagnosis not present

## 2022-02-21 DIAGNOSIS — I480 Paroxysmal atrial fibrillation: Secondary | ICD-10-CM

## 2022-02-21 LAB — BASIC METABOLIC PANEL
BUN: 35 mg/dL — ABNORMAL HIGH (ref 6–23)
CO2: 24 mEq/L (ref 19–32)
Calcium: 9.5 mg/dL (ref 8.4–10.5)
Chloride: 106 mEq/L (ref 96–112)
Creatinine, Ser: 1.53 mg/dL — ABNORMAL HIGH (ref 0.40–1.20)
GFR: 35.21 mL/min — ABNORMAL LOW (ref >60.00)
Glucose, Bld: 103 mg/dL — ABNORMAL HIGH (ref 70–99)
Potassium: 4.7 mEq/L (ref 3.5–5.1)
Sodium: 143 mEq/L (ref 135–145)

## 2022-02-21 NOTE — Telephone Encounter (Signed)
Prescription refill request for Eliquis received. Indication: Afib  Last office visit: 11/04/21 Mariah Milling)  Scr: 1.44 (01/28/22) Age: 66 Weight: 86.8kg  Appropriate dose and refill sent to requested pharmacy.

## 2022-02-24 ENCOUNTER — Other Ambulatory Visit: Payer: Self-pay | Admitting: Internal Medicine

## 2022-02-27 ENCOUNTER — Encounter: Payer: Self-pay | Admitting: Internal Medicine

## 2022-02-27 ENCOUNTER — Ambulatory Visit (INDEPENDENT_AMBULATORY_CARE_PROVIDER_SITE_OTHER): Payer: PPO | Admitting: Internal Medicine

## 2022-02-27 DIAGNOSIS — N1832 Chronic kidney disease, stage 3b: Secondary | ICD-10-CM | POA: Diagnosis not present

## 2022-02-27 DIAGNOSIS — D6869 Other thrombophilia: Secondary | ICD-10-CM

## 2022-02-27 DIAGNOSIS — I480 Paroxysmal atrial fibrillation: Secondary | ICD-10-CM

## 2022-02-27 DIAGNOSIS — R739 Hyperglycemia, unspecified: Secondary | ICD-10-CM

## 2022-02-27 DIAGNOSIS — D329 Benign neoplasm of meninges, unspecified: Secondary | ICD-10-CM

## 2022-02-27 DIAGNOSIS — E7800 Pure hypercholesterolemia, unspecified: Secondary | ICD-10-CM | POA: Diagnosis not present

## 2022-02-27 DIAGNOSIS — I739 Peripheral vascular disease, unspecified: Secondary | ICD-10-CM | POA: Diagnosis not present

## 2022-02-27 DIAGNOSIS — I10 Essential (primary) hypertension: Secondary | ICD-10-CM

## 2022-02-27 NOTE — Progress Notes (Signed)
Patient ID: HODA HON, female   DOB: [birth year], 66 y.o.   MRN: [id number]   Subjective:    Patient ID: [name], female    DOB: 05-11-1956, 66 y.o.   MRN: [id number]   Patient here for work in appt.   Chief Complaint  Patient presents with   discuss kidney function   .   HPI Have been monitoring blood pressure and following kidney function.  Her kidney function recently has been decreasing - GFR 11/2021 42, 01/28/22 - 37.89 and 02/21/22 - 35.2.  she is taking meloxicam, but rarely takes.  She is taking lasix prn.  Unclear exactly how often uses. Since we notified her of her labs, she has not been taking.  No swelling of her ankles.  No chest pain.  No sob.  No cough or congestion.  Has been trying to drink plenty of water.  No nausea or vomiting.  Discussed labs.  Abdominal ultrasound - left kidney smaller than right.  Left kidney has a slight contour irregularity - to suggest left renal scarring.     Past Medical History:  Diagnosis Date   Afib ([place])    Cellulitis of right arm    Embolus to lower extremity ([place])    a. 05/2014 s/p thrombolysis of LLE.   Hiatal hernia    History of echocardiogram    a. 07/2020 Echo: EF 55-60%, no rwma, Nl RV size/fxn. Mildly dil LA. Mild MR/MS. Sev mitral annular Ca2+. Mild Ao sclerosis w/o stenosis.   History of stress test    a. 05/2014 lexiscan MV: EF 67%, no ischemia/infarct.   Hypercholesteremia    Hyperglycemia    Hypertension    Left Cerebellopontine Angle Meningioma ([place])    a. 10/2016 s/p Gamma knife.   Osteoarthritis    PAF (paroxysmal atrial fibrillation) ([place])    a. Dx 10/2014->converted on amio. CHA2DS2VASc = 3-4-->Eliquis.   Proteinuria    Shingles    dec 2021   Spinal stenosis at L4-L5 level    Thrombophlebitis of posterior tibial vein ([place]) 06/01/2014   Trigeminal neuralgia 08/2016   Past Surgical History:  Procedure Laterality Date   AORTOGRAM     BACK SURGERY  11/13/2014   CARDIOVERSION N/A 07/19/2020   Procedure:  CARDIOVERSION;  Surgeon: Minna Merritts, MD;  Location: [facility] ORS;  Service: Cardiovascular;  Laterality: N/A;   CHOLECYSTECTOMY     COLONOSCOPY WITH PROPOFOL N/A 08/15/2016   Procedure: COLONOSCOPY WITH PROPOFOL;  Surgeon: Lollie Sails, MD;  Location: [facility] ENDOSCOPY;  Service: Endoscopy;  Laterality: N/A;   KNEE ARTHROSCOPY     LOWER EXTREMITY ANGIOGRAM     Family History  Problem Relation Age of Onset   Hypertension Mother    Dementia Mother    Stroke Father        3 strokes   Hypertension Father    Breast cancer Paternal Grandmother    Colon cancer Neg Hx    Social History   Socioeconomic History   Marital status: Married    Spouse name: Not on file   Number of children: 2   Years of education: Not on file   Highest education level: Not on file  Occupational History   Not on file  Tobacco Use   Smoking status: Never   Smokeless tobacco: Never  Vaping Use   Vaping Use: Never used  Substance and Sexual Activity   Alcohol use: Yes    Alcohol/week: 0.0 standard drinks of alcohol    Comment:  occ. wine   Drug use: No   Sexual activity: Not on file  Other Topics Concern   Not on file  Social History Narrative   Right handed    Caffeine use: coffee (1/2 cup per day)   Social Determinants of Health   Financial Resource Strain: Not on file  Food Insecurity: Not on file  Transportation Needs: Not on file  Physical Activity: Not on file  Stress: Not on file  Social Connections: Not on file     Review of Systems  Constitutional:  Negative for appetite change and unexpected weight change.  HENT:  Negative for congestion and sinus pressure.   Respiratory:  Negative for cough, chest tightness and shortness of breath.   Cardiovascular:  Negative for chest pain, palpitations and leg swelling.  Gastrointestinal:  Negative for abdominal pain, diarrhea, nausea and vomiting.  Genitourinary:  Negative for difficulty urinating and dysuria.  Musculoskeletal:  Negative  for joint swelling and myalgias.  Skin:  Negative for color change and rash.  Neurological:  Negative for dizziness, light-headedness and headaches.  Psychiatric/Behavioral:  Negative for agitation and dysphoric mood.        Objective:     BP 136/80 (BP Location: Left Arm, Patient Position: Sitting, Cuff Size: Normal)   Pulse (!) 127   Temp 98.5 F (36.9 C) (Oral)   Ht 5' (1.524 m)   Wt 196 lb 9.6 oz (89.2 kg)   SpO2 97%   BMI 38.40 kg/m  Wt Readings from Last 3 Encounters:  02/27/22 196 lb 9.6 oz (89.2 kg)  12/31/21 191 lb 6.4 oz (86.8 kg)  11/29/21 195 lb 3.2 oz (88.5 kg)    Physical Exam Vitals reviewed.  Constitutional:      General: She is not in acute distress.    Appearance: Normal appearance.  HENT:     Head: Normocephalic and atraumatic.     Right Ear: External ear normal.     Left Ear: External ear normal.  Eyes:     General: No scleral icterus.       Right eye: No discharge.        Left eye: No discharge.     Conjunctiva/sclera: Conjunctivae normal.  Neck:     Thyroid: No thyromegaly.  Cardiovascular:     Rate and Rhythm: Normal rate and regular rhythm.  Pulmonary:     Effort: No respiratory distress.     Breath sounds: Normal breath sounds. No wheezing.  Abdominal:     General: Bowel sounds are normal.     Palpations: Abdomen is soft.     Tenderness: There is no abdominal tenderness.  Musculoskeletal:        General: No swelling or tenderness.     Cervical back: Neck supple. No tenderness.  Lymphadenopathy:     Cervical: No cervical adenopathy.  Skin:    Findings: No erythema or rash.  Neurological:     Mental Status: She is alert.  Psychiatric:        Mood and Affect: Mood normal.        Behavior: Behavior normal.      Outpatient Encounter Medications as of 02/27/2022  Medication Sig   acetaminophen (TYLENOL) 650 MG CR tablet Take by mouth.   amitriptyline (ELAVIL) 25 MG tablet TAKE 1 TABLET(25 MG) BY MOUTH AT BEDTIME.   amLODipine  (NORVASC) 5 MG tablet TAKE 1 TABLET(5 MG) BY MOUTH TWICE DAILY   atenolol (TENORMIN) 25 MG tablet TAKE 1 TABLET BY MOUTH EVERY DAY  atorvastatin (LIPITOR) 40 MG tablet TAKE 1 TABLET BY MOUTH EVERY DAY   ELIQUIS 5 MG TABS tablet TAKE 1 TABLET BY MOUTH TWICE DAILY   furosemide (LASIX) 20 MG tablet Take 1 tablet (20 mg total) by mouth daily as needed. Sparingly for leg swelling   lamoTRIgine (LAMICTAL) 100 MG tablet TAKE 1 TABLET(100 MG) BY MOUTH TWICE DAILY   losartan (COZAAR) 100 MG tablet TAKE 1 TABLET(100 MG) BY MOUTH DAILY   meloxicam (MOBIC) 7.5 MG tablet Take 1 tablet (7.5 mg total) by mouth daily as needed for pain.   No facility-administered encounter medications on file as of 02/27/2022.     Lab Results  Component Value Date   WBC 8.8 12/24/2021   HGB 16.3 (H) 12/24/2021   HCT 48.8 (H) 12/24/2021   PLT 228.0 12/24/2021   GLUCOSE 103 (H) 02/21/2022   CHOL 161 11/29/2021   TRIG 97.0 11/29/2021   HDL 46.90 11/29/2021   LDLCALC 95 11/29/2021   ALT 22 11/29/2021   AST 22 11/29/2021   NA 143 02/21/2022   K 4.7 02/21/2022   CL 106 02/21/2022   CREATININE 1.53 (H) 02/21/2022   BUN 35 (H) 02/21/2022   CO2 24 02/21/2022   TSH 3.52 11/29/2021   INR 1.5 (H) 06/03/2020   HGBA1C 6.3 11/29/2021   MICROALBUR 35.5 (H) 04/08/2016    MM 3D SCREEN BREAST BILATERAL  Result Date: 07/10/2021 CLINICAL DATA:  Screening. EXAM: DIGITAL SCREENING BILATERAL MAMMOGRAM WITH TOMOSYNTHESIS AND CAD TECHNIQUE: Bilateral screening digital craniocaudal and mediolateral oblique mammograms were obtained. Bilateral screening digital breast tomosynthesis was performed. The images were evaluated with computer-aided detection. COMPARISON:  Previous exam(s). ACR Breast Density Category b: There are scattered areas of fibroglandular density. FINDINGS: There are no findings suspicious for malignancy. IMPRESSION: No mammographic evidence of malignancy. A result letter of this screening mammogram will be mailed  directly to the patient. RECOMMENDATION: Screening mammogram in one year. (Code:SM-B-01Y) BI-RADS CATEGORY  1: Negative. Electronically Signed   By: Audie Pinto M.D.   On: 07/10/2021 15:27      Assessment & Plan:   Problem List Items Addressed This Visit     Acquired thrombophilia ([place])    afib on eliquis.        CKD (chronic kidney disease) stage 3, GFR 30-59 ml/min (HCC)    GFR decreasing.  Discussed.  No evidence of volume overload on exam.  Hold lasix.  Will continue her other blood pressure medications as she is doing.  Avoid meloxicam.  Stay hydrated.  Follow metabolic panel.  Request to see [facility].       Relevant Orders   Ambulatory referral to Nephrology   Basic metabolic panel   Urinalysis, Routine w reflex microscopic   Multiple Myeloma Panel (SPEP&IFE w/QIG)   Hypercholesterolemia    Continue Lipitor.  Low cholesterol diet and exercise.  Follow lipid panel liver function test.      Hyperglycemia    Low-carb diet and exercise.  Follow met b and A1c.      Hypertension    Blood pressure as outlined.  Continue Tenormin and amlodipine.  Change losartan/hctz to losartan 159m q day. Follow pressures.  No evidence of volume overload today.  Hold lasix.  Follow metabolic panel.       Meningioma ([facility]    Being followed by neurology.  Status post gamma knife treatment.  No pain currently.  Seeing Dr. SFelecia Shelling  Currently on Lamictal and amitriptyline.      PAD (peripheral  artery disease) ([place])    Status post angioplasty.  On Eliquis.  Continue risk factor modification.  Continue statin.      Paroxysmal atrial fibrillation (HCC)    On amiodarone and atenolol.  Continue Eliquis.  Stable.        [name], MD

## 2022-03-01 ENCOUNTER — Encounter: Payer: Self-pay | Admitting: Internal Medicine

## 2022-03-01 NOTE — Assessment & Plan Note (Addendum)
GFR decreasing.  Discussed.  No evidence of volume overload on exam.  Hold lasix.  Will continue her other blood pressure medications as she is doing.  Avoid meloxicam.  Stay hydrated.  Follow metabolic panel.  Request to see [facility].

## 2022-03-01 NOTE — Assessment & Plan Note (Signed)
Low-carb diet and exercise.  Follow met b and A1c. ?

## 2022-03-01 NOTE — Assessment & Plan Note (Signed)
Continue Lipitor.  Low-cholesterol diet and exercise.  Follow lipid panel liver function test. 

## 2022-03-01 NOTE — Assessment & Plan Note (Signed)
Being followed by neurology.  Status post gamma knife treatment.  No pain currently.  Seeing Dr. Sater.  Currently on Lamictal and amitriptyline. 

## 2022-03-01 NOTE — Assessment & Plan Note (Signed)
afib on eliquis.  

## 2022-03-01 NOTE — Assessment & Plan Note (Signed)
Status post angioplasty.  On Eliquis.  Continue risk factor modification.  Continue statin. 

## 2022-03-01 NOTE — Assessment & Plan Note (Signed)
On amiodarone and atenolol.  Continue Eliquis.  Stable. 

## 2022-03-01 NOTE — Assessment & Plan Note (Signed)
Blood pressure as outlined.  Continue Tenormin and amlodipine.  Change losartan/hctz to losartan '100mg'$  q day. Follow pressures.  No evidence of volume overload today.  Hold lasix.  Follow metabolic panel.

## 2022-03-13 ENCOUNTER — Other Ambulatory Visit: Payer: Self-pay | Admitting: Internal Medicine

## 2022-03-13 ENCOUNTER — Other Ambulatory Visit: Payer: PPO

## 2022-03-17 ENCOUNTER — Other Ambulatory Visit (INDEPENDENT_AMBULATORY_CARE_PROVIDER_SITE_OTHER): Payer: PPO

## 2022-03-17 DIAGNOSIS — N1832 Chronic kidney disease, stage 3b: Secondary | ICD-10-CM

## 2022-03-17 LAB — URINALYSIS, ROUTINE W REFLEX MICROSCOPIC
Hgb urine dipstick: NEGATIVE
Nitrite: NEGATIVE
Specific Gravity, Urine: 1.02 (ref 1.000–1.030)
Total Protein, Urine: 100 — AB
Urine Glucose: NEGATIVE
Urobilinogen, UA: 1 (ref 0.0–1.0)
pH: 6.5 (ref 5.0–8.0)

## 2022-03-17 LAB — BASIC METABOLIC PANEL
BUN: 26 mg/dL — ABNORMAL HIGH (ref 6–23)
CO2: 23 mEq/L (ref 19–32)
Calcium: 9.6 mg/dL (ref 8.4–10.5)
Chloride: 105 mEq/L (ref 96–112)
Creatinine, Ser: 1.35 mg/dL — ABNORMAL HIGH (ref 0.40–1.20)
GFR: 40.9 mL/min — ABNORMAL LOW (ref >60.00)
Glucose, Bld: 110 mg/dL — ABNORMAL HIGH (ref 70–99)
Potassium: 4.5 mEq/L (ref 3.5–5.1)
Sodium: 139 mEq/L (ref 135–145)

## 2022-03-18 ENCOUNTER — Telehealth: Payer: Self-pay

## 2022-03-18 ENCOUNTER — Encounter: Payer: Self-pay | Admitting: Neurology

## 2022-03-18 ENCOUNTER — Ambulatory Visit: Payer: PPO | Admitting: Neurology

## 2022-03-18 VITALS — BP 139/78 | HR 85 | Ht 60.0 in | Wt 196.0 lb

## 2022-03-18 DIAGNOSIS — Z923 Personal history of irradiation: Secondary | ICD-10-CM | POA: Diagnosis not present

## 2022-03-18 DIAGNOSIS — D329 Benign neoplasm of meninges, unspecified: Secondary | ICD-10-CM | POA: Diagnosis not present

## 2022-03-18 DIAGNOSIS — G500 Trigeminal neuralgia: Secondary | ICD-10-CM | POA: Diagnosis not present

## 2022-03-18 DIAGNOSIS — B0229 Other postherpetic nervous system involvement: Secondary | ICD-10-CM

## 2022-03-18 DIAGNOSIS — R260 Ataxic gait: Secondary | ICD-10-CM

## 2022-03-18 MED ORDER — AMITRIPTYLINE HCL 25 MG PO TABS: ORAL_TABLET | ORAL | 4 refills | Status: DC

## 2022-03-18 MED ORDER — LAMOTRIGINE 100 MG PO TABS: ORAL_TABLET | ORAL | 3 refills | Status: DC

## 2022-03-18 NOTE — Progress Notes (Signed)
[facility]  PATIENT: Kristin James DOB: 1956/03/06  REFERRING DOCTOR OR PCP:  [name] SOURCE: Patient, notes from Dr. Nicki Reaper,  _________________________________   HISTORICAL  CHIEF COMPLAINT:  Chief Complaint  Patient presents with   Follow-up    Pt alone, rm 1. Presents for follow up visit. States things are stable. No issue or concerns    HISTORY OF PRESENT ILLNESS:  Kristin James is a 66 y.o. woman with a meningioma causing left trigeminal neuralgia.  Update 03/18/2022: She feels her facial pain is doing very well on the current regimen.    She has a left cerebellopontine angle meningioma that has been treated with gamma knife in the past.  It is stable on MRI.    Last MRI 01/23/2021 showed no worsening  She feels her pain is very well controlled on her current medications.  She is currently on lamotrigine 100 mg po bid and oxcarbazepine 300 mg bid and amitriptyline 25 mg nighty.  She had gamma knofe 10/2016.   Recent MRIs show no further growth.      She is walking more but still notes issues with her balance and will use a cane for longer distance.   No facial numbness.    She has some falls, but none recently.  She tries to walk 30-40 minutes a day.  She gets 10,000 steps most days.  We discussed that her balance issues could be a combination of the meningioma, radiation and medication side effect  She had shingles in January 2022.  The distribution was midthoracic going from the back to under the breasts on the left.  She still has some allodynia and numbness but not really pain.     Left cerebellopontine angle meningioma history of She began to experience a dull ache in the left jaw region in mid 2017. Initially, she felt her symptoms were due to a dental issue and she had a tooth removed and a repeat root canal.  A few months later she began to experience short shock like severe pain in the left jaw.  She would get frequent zaps of severe pain into  the left cheek.  When pain was more severe, she couldn't talk.   Brushing her teeth would trigger the most painful sensations.   She was initially placed on gabapentin 100 mg several times a day by her PCP and then Tegretol was added. The Tegretol was switched to Trileptal for tolerability.   MRI of the brain showed that she had a meningioma that involved the facial nerve region.        She continued to have pain and was referred to neurosurgery at [facility] for evaluation of gamma knife procedures.   March 2018, she underwent Gamma knife stereotactic radiosurgery by Dr. Arlan Organ and Dr. Vallarie Mare at [facility] neurosurgery and radiation oncology at a dose of 20 Gy was delivered to the outlined tumor divided into 4 fractions.   She felt pain improved some initially but did not resolve.  Pain worsened a few months later despite increases in medication and she was referred to me.  I placed her on lamotrigine and titrate up to 100 mg p.o. twice daily. She is not a candidate for repeat radiation or surgery.     Imaging:  MRI of the brain 10/01/2016 performed at [facility] shows a large left cerebellopontine angle meningioma extending into the left internal auditory canal. There is mass effect on the pons and left vertebral artery. The tumor  crosses the expected path of the left cranial nerves V, VI, VII and VIII.   MRI of the brain 11/17/2016 showing the same mass and mentioning redemonstration of encephalomalacia in the left parieto-occipital region and lacunar infarction in the right cerebellum and thalamus.  MRI 03/23/2019 [facility]) 1.  Redemonstrated extra-axial dural based mass along the left cerebellar pontine angle with extension into the left internal auditory canal, left Meckel's cave, and Pars Nervosa of the left jugular foramen which is similar to possibly slightly decreased in size from exams dated 07/09/2018 and 07/08/2017. 2.  No significant change in surrounding adjacent mass effect, particularly  on the middle cerebellar peduncle and medulla/pons.   MRI 01/20/2021 showed no changes.  REVIEW OF SYSTEMS: Constitutional: No fevers, chills, sweats, or change in appetite Eyes: No visual changes, double vision, eye pain Ear, nose and throat: No hearing loss, ear pain, nasal congestion, sore throat Cardiovascular: No chest pain, palpitations Respiratory:  No shortness of breath at rest or with exertion.   No wheezes GastrointestinaI: No nausea, vomiting, diarrhea, abdominal pain, fecal incontinence Genitourinary:  No dysuria, urinary retention or frequency.  No nocturia. Musculoskeletal:  No neck pain, back pain Integumentary: No rash, pruritus, skin lesions Neurological: as above Psychiatric: No depression at this time.  No anxiety Endocrine: No palpitations, diaphoresis, change in appetite, change in weigh or increased thirst Hematologic/Lymphatic:  No anemia, purpura, petechiae. Allergic/Immunologic: No itchy/runny eyes, nasal congestion, recent allergic reactions, rashes  ALLERGIES: No Known Allergies  HOME MEDICATIONS:  Current Outpatient Medications:    acetaminophen (TYLENOL) 650 MG CR tablet, Take by mouth., Disp: , Rfl:    amitriptyline (ELAVIL) 25 MG tablet, TAKE 1 TABLET(25 MG) BY MOUTH AT BEDTIME., Disp: 30 tablet, Rfl: 11   amLODipine (NORVASC) 5 MG tablet, TAKE 1 TABLET(5 MG) BY MOUTH TWICE DAILY, Disp: 180 tablet, Rfl: 1   atenolol (TENORMIN) 25 MG tablet, TAKE 1 TABLET BY MOUTH EVERY DAY, Disp: 30 tablet, Rfl: 5   atorvastatin (LIPITOR) 40 MG tablet, TAKE 1 TABLET BY MOUTH EVERY DAY, Disp: 90 tablet, Rfl: 3   ELIQUIS 5 MG TABS tablet, TAKE 1 TABLET BY MOUTH TWICE DAILY, Disp: 180 tablet, Rfl: 1   furosemide (LASIX) 20 MG tablet, Take 1 tablet (20 mg total) by mouth daily as needed. Sparingly for leg swelling, Disp: 45 tablet, Rfl: 3   lamoTRIgine (LAMICTAL) 100 MG tablet, TAKE 1 TABLET(100 MG) BY MOUTH TWICE DAILY, Disp: 180 tablet, Rfl: 0   losartan (COZAAR) 100 MG  tablet, TAKE 1 TABLET(100 MG) BY MOUTH DAILY, Disp: 30 tablet, Rfl: 1   meloxicam (MOBIC) 7.5 MG tablet, Take 1 tablet (7.5 mg total) by mouth daily as needed for pain., Disp: 30 tablet, Rfl: 0  PAST MEDICAL HISTORY: Past Medical History:  Diagnosis Date   Afib ([place])    Cellulitis of right arm    Embolus to lower extremity ([place])    a. 05/2014 s/p thrombolysis of LLE.   Hiatal hernia    History of echocardiogram    a. 07/2020 Echo: EF 55-60%, no rwma, Nl RV size/fxn. Mildly dil LA. Mild MR/MS. Sev mitral annular Ca2+. Mild Ao sclerosis w/o stenosis.   History of stress test    a. 05/2014 lexiscan MV: EF 67%, no ischemia/infarct.   Hypercholesteremia    Hyperglycemia    Hypertension    Left Cerebellopontine Angle Meningioma ([place])    a. 10/2016 s/p Gamma knife.   Osteoarthritis    PAF (paroxysmal atrial fibrillation) ([place])  a. Dx 10/2014->converted on amio. CHA2DS2VASc = 3-4-->Eliquis.   Proteinuria    Shingles    dec 2021   Spinal stenosis at L4-L5 level    Thrombophlebitis of posterior tibial vein ([place]) 06/01/2014   Trigeminal neuralgia 08/2016    PAST SURGICAL HISTORY: Past Surgical History:  Procedure Laterality Date   AORTOGRAM     BACK SURGERY  11/13/2014   CARDIOVERSION N/A 07/19/2020   Procedure: CARDIOVERSION;  Surgeon: Minna Merritts, MD;  Location: [facility] ORS;  Service: Cardiovascular;  Laterality: N/A;   CHOLECYSTECTOMY     COLONOSCOPY WITH PROPOFOL N/A 08/15/2016   Procedure: COLONOSCOPY WITH PROPOFOL;  Surgeon: Lollie Sails, MD;  Location: [facility] ENDOSCOPY;  Service: Endoscopy;  Laterality: N/A;   KNEE ARTHROSCOPY     LOWER EXTREMITY ANGIOGRAM      FAMILY HISTORY: Family History  Problem Relation Age of Onset   Hypertension Mother    Dementia Mother    Stroke Father        3 strokes   Hypertension Father    Breast cancer Paternal Grandmother    Colon cancer Neg Hx     SOCIAL HISTORY:  Social History   Socioeconomic History   Marital status:  Married    Spouse name: Not on file   Number of children: 2   Years of education: Not on file   Highest education level: Not on file  Occupational History   Not on file  Tobacco Use   Smoking status: Never   Smokeless tobacco: Never  Vaping Use   Vaping Use: Never used  Substance and Sexual Activity   Alcohol use: Yes    Alcohol/week: 0.0 standard drinks of alcohol    Comment: occ. wine   Drug use: No   Sexual activity: Not on file  Other Topics Concern   Not on file  Social History Narrative   Right handed    Caffeine use: coffee (1/2 cup per day)   Social Determinants of Health   Financial Resource Strain: Not on file  Food Insecurity: Not on file  Transportation Needs: Not on file  Physical Activity: Not on file  Stress: Not on file  Social Connections: Not on file  Intimate Partner Violence: Not on file     PHYSICAL EXAM  Vitals:   03/18/22 0941  BP: 139/78  Pulse: 85  Weight: 196 lb (88.9 kg)  Height: 5' (1.524 m)    Body mass index is 38.28 kg/m.   General: The patient is well-developed and well-nourished and in no acute distress  Skin: Sequela of the herpetic eruption around T5 or T6  Neurologic Exam  Mental status: The patient is alert and oriented x 3 at the time of the examination. The patient has apparent normal recent and remote memory, with an apparently normal attention span and concentration ability.   Speech is normal.  Cranial nerves: Extraocular movements are full. Facial symmetry is present.  She has good facial sensation to touch bilateral.  Facial strength is normal.  Trapezius and sternocleidomastoid strength is normal. No dysarthria is noted.   No obvious hearing deficits are noted.  Motor:  Muscle bulk is normal.   Tone is normal. Strength is  5 / 5 in all 4 extremities.   Sensory: Sensory testing is intact to touch and vibration sensation in all 4 extremities.   Mild altered sensation around  +/- T4 on left  Coordination:  Cerebellar testing reveals good finger-nose-finger and heel-to-shin bilaterally.  Gait and station: Station  is normal.   Her gait is mildly wide and tandem is poor.   She walks without a cane in the room.   Romberg is negative.   Reflexes: Deep tendon reflexes are symmetric and normal bilaterally.       DIAGNOSTIC DATA (LABS, IMAGING, TESTING) - I reviewed patient records, labs, notes, testing and imaging myself where available.  Lab Results  Component Value Date   WBC 8.8 12/24/2021   HGB 16.3 (H) 12/24/2021   HCT 48.8 (H) 12/24/2021   MCV 94.9 12/24/2021   PLT 228.0 12/24/2021      Component Value Date/Time   NA 139 03/17/2022 0901   NA 139 07/11/2020 1404   NA 142 05/23/2014 0540   K 4.5 03/17/2022 0901   K 3.7 05/23/2014 0540   CL 105 03/17/2022 0901   CL 114 (H) 05/23/2014 0540   CO2 23 03/17/2022 0901   CO2 21 05/23/2014 0540   GLUCOSE 110 (H) 03/17/2022 0901   GLUCOSE 90 05/23/2014 0540   BUN 26 (H) 03/17/2022 0901   BUN 28 (H) 07/11/2020 1404   BUN 16 05/23/2014 0540   CREATININE 1.35 (H) 03/17/2022 0901   CREATININE 1.07 05/23/2014 0540   CALCIUM 9.6 03/17/2022 0901   CALCIUM 7.4 (L) 05/23/2014 0540   PROT 7.2 11/29/2021 0749   PROT 7.4 05/17/2013 1028   ALBUMIN 4.4 11/29/2021 0749   ALBUMIN 3.6 05/17/2013 1028   AST 22 11/29/2021 0749   AST 28 05/17/2013 1028   ALT 22 11/29/2021 0749   ALT 27 05/17/2013 1028   ALKPHOS 113 11/29/2021 0749   ALKPHOS 103 05/17/2013 1028   BILITOT 1.0 11/29/2021 0749   BILITOT 0.8 05/17/2013 1028   GFRNONAA 40 (L) 07/11/2020 1404   GFRNONAA 57 (L) 05/23/2014 0540   GFRAA 46 (L) 07/11/2020 1404   GFRAA >60 05/23/2014 0540   Lab Results  Component Value Date   CHOL 161 11/29/2021   HDL 46.90 11/29/2021   LDLCALC 95 11/29/2021   TRIG 97.0 11/29/2021   CHOLHDL 3 11/29/2021   Lab Results  Component Value Date   HGBA1C 6.3 11/29/2021   No results found for: "VITAMINB12" Lab Results  Component Value Date   TSH  3.52 11/29/2021       ASSESSMENT AND PLAN   Meningioma (HCC)  Ataxic gait  Postherpetic neuralgia  Trigeminal neuralgia of left side of face  Status post gamma knife treatment  1.  Continue lamotrigine 100 mg twice daily,  and amitriptyline nightly.    2.  If pain worsens or if gait worsens, additional treatment of the meningioma, either gamma knife or surgery, may be necessary.   Will need MRi in 2024 3.   Stay active and exercise as tolerated.   Use cane for safety.    4.   RTC 12 months, sooner if problems  Phuoc Huy A. Felecia Shelling, MD, [facility] 3/57/0177, 93:90 AM Certified in Neurology, Clinical Neurophysiology, Sleep Medicine, Pain Medicine and Neuroimaging  [facility] [address], [place], [place] [address] [phone]

## 2022-03-18 NOTE — Telephone Encounter (Signed)
[name] for lab results.  

## 2022-03-20 LAB — MULTIPLE MYELOMA PANEL, SERUM
Albumin SerPl Elph-Mcnc: 3.7 g/dL (ref 2.9–4.4)
Albumin/Glob SerPl: 1.2 (ref 0.7–1.7)
Alpha 1: 0.3 g/dL (ref 0.0–0.4)
Alpha2 Glob SerPl Elph-Mcnc: 0.9 g/dL (ref 0.4–1.0)
B-Globulin SerPl Elph-Mcnc: 1.1 g/dL (ref 0.7–1.3)
Gamma Glob SerPl Elph-Mcnc: 0.9 g/dL (ref 0.4–1.8)
Globulin, Total: 3.1 g/dL (ref 2.2–3.9)
IgA/Immunoglobulin A, Serum: 169 mg/dL (ref 87–352)
IgG (Immunoglobin G), Serum: 857 mg/dL (ref 586–1602)
IgM (Immunoglobulin M), Srm: 158 mg/dL (ref 26–217)
Total Protein: 6.8 g/dL (ref 6.0–8.5)

## 2022-03-21 ENCOUNTER — Other Ambulatory Visit: Payer: Self-pay | Admitting: Cardiovascular Disease

## 2022-03-23 ENCOUNTER — Other Ambulatory Visit: Payer: Self-pay | Admitting: Internal Medicine

## 2022-03-25 ENCOUNTER — Other Ambulatory Visit (HOSPITAL_COMMUNITY)
Admission: RE | Admit: 2022-03-25 | Discharge: 2022-03-25 | Disposition: A | Discharge status: Home / Self Care | Payer: PPO | Source: Ambulatory Visit | Attending: Internal Medicine | Admitting: Internal Medicine

## 2022-03-25 ENCOUNTER — Ambulatory Visit (INDEPENDENT_AMBULATORY_CARE_PROVIDER_SITE_OTHER): Payer: PPO | Admitting: Internal Medicine

## 2022-03-25 ENCOUNTER — Encounter: Payer: Self-pay | Admitting: Internal Medicine

## 2022-03-25 ENCOUNTER — Other Ambulatory Visit: Payer: Self-pay | Admitting: Internal Medicine

## 2022-03-25 VITALS — BP 128/82 | HR 74 | Temp 98.6°F | Resp 16 | Ht 60.0 in | Wt 197.6 lb

## 2022-03-25 DIAGNOSIS — E2839 Other primary ovarian failure: Secondary | ICD-10-CM | POA: Diagnosis not present

## 2022-03-25 DIAGNOSIS — R739 Hyperglycemia, unspecified: Secondary | ICD-10-CM

## 2022-03-25 DIAGNOSIS — D329 Benign neoplasm of meninges, unspecified: Secondary | ICD-10-CM

## 2022-03-25 DIAGNOSIS — E7800 Pure hypercholesterolemia, unspecified: Secondary | ICD-10-CM | POA: Diagnosis not present

## 2022-03-25 DIAGNOSIS — Z1151 Encounter for screening for human papillomavirus (HPV): Secondary | ICD-10-CM | POA: Diagnosis not present

## 2022-03-25 DIAGNOSIS — I10 Essential (primary) hypertension: Secondary | ICD-10-CM | POA: Diagnosis not present

## 2022-03-25 DIAGNOSIS — D582 Other hemoglobinopathies: Secondary | ICD-10-CM | POA: Diagnosis not present

## 2022-03-25 DIAGNOSIS — Z0000 Encounter for general adult medical examination without abnormal findings: Secondary | ICD-10-CM

## 2022-03-25 DIAGNOSIS — D6869 Other thrombophilia: Secondary | ICD-10-CM | POA: Diagnosis not present

## 2022-03-25 DIAGNOSIS — I739 Peripheral vascular disease, unspecified: Secondary | ICD-10-CM | POA: Diagnosis not present

## 2022-03-25 DIAGNOSIS — I480 Paroxysmal atrial fibrillation: Secondary | ICD-10-CM

## 2022-03-25 DIAGNOSIS — N1832 Chronic kidney disease, stage 3b: Secondary | ICD-10-CM

## 2022-03-25 DIAGNOSIS — Z7185 Encounter for immunization safety counseling: Secondary | ICD-10-CM

## 2022-03-25 DIAGNOSIS — Z124 Encounter for screening for malignant neoplasm of cervix: Secondary | ICD-10-CM | POA: Diagnosis not present

## 2022-03-25 DIAGNOSIS — L989 Disorder of the skin and subcutaneous tissue, unspecified: Secondary | ICD-10-CM

## 2022-03-25 MED ORDER — WEGOVY 0.25 MG/0.5ML ~~LOC~~ SOAJ: 0.2500 mg | SUBCUTANEOUS | 1 refills | Status: DC

## 2022-03-25 MED ORDER — DOXYCYCLINE HYCLATE 100 MG PO TABS: 100.0000 mg | ORAL_TABLET | Freq: Two times a day (BID) | ORAL | 0 refills | Status: DC

## 2022-03-25 MED ORDER — LOSARTAN POTASSIUM 100 MG PO TABS: 100.0000 mg | ORAL_TABLET | Freq: Every day | ORAL | 1 refills | Status: DC

## 2022-03-25 NOTE — Progress Notes (Signed)
Order placed for f/u cbc.   

## 2022-03-25 NOTE — Progress Notes (Signed)
Patient ID: Kristin James, female   DOB: 09-07-1955, 66 y.o.   MRN: [id number]   Subjective:    Patient ID: Kristin James, female    DOB: 04-07-1956, 66 y.o.   MRN: [id number]   Patient here for her physical exam.   Chief Complaint  Patient presents with   Annual Exam   .   HPI She reports she is doing relatively well.  Does report a possible bite - right lower leg.  Raised lesion - surrounding erythema.  No pain now.  Is doing better, but still increased redness surrounding the raised lesion.  No redness extending up the leg.  Saw Dr Felecia Shelling last week.  Checked out ok.  Recommended f/u in one year.  No headache.  No chest pain or sob reported.  No abdominal pain.  Bowels moving.     Past Medical History:  Diagnosis Date   Afib ([place])    Cellulitis of right arm    Embolus to lower extremity ([place])    a. 05/2014 s/p thrombolysis of LLE.   Hiatal hernia    History of echocardiogram    a. 07/2020 Echo: EF 55-60%, no rwma, Nl RV size/fxn. Mildly dil LA. Mild MR/MS. Sev mitral annular Ca2+. Mild Ao sclerosis w/o stenosis.   History of stress test    a. 05/2014 lexiscan MV: EF 67%, no ischemia/infarct.   Hypercholesteremia    Hyperglycemia    Hypertension    Left Cerebellopontine Angle Meningioma ([place])    a. 10/2016 s/p Gamma knife.   Osteoarthritis    PAF (paroxysmal atrial fibrillation) ([place])    a. Dx 10/2014->converted on amio. CHA2DS2VASc = 3-4-->Eliquis.   Proteinuria    Shingles    dec 2021   Spinal stenosis at L4-L5 level    Thrombophlebitis of posterior tibial vein ([place]) 06/01/2014   Trigeminal neuralgia 08/2016   Past Surgical History:  Procedure Laterality Date   AORTOGRAM     BACK SURGERY  11/13/2014   CARDIOVERSION N/A 07/19/2020   Procedure: CARDIOVERSION;  Surgeon: Minna Merritts, MD;  Location: [facility] ORS;  Service: Cardiovascular;  Laterality: N/A;   CHOLECYSTECTOMY     COLONOSCOPY WITH PROPOFOL N/A 08/15/2016   Procedure: COLONOSCOPY WITH PROPOFOL;  Surgeon:  Lollie Sails, MD;  Location: [facility] ENDOSCOPY;  Service: Endoscopy;  Laterality: N/A;   KNEE ARTHROSCOPY     LOWER EXTREMITY ANGIOGRAM     Family History  Problem Relation Age of Onset   Hypertension Mother    Dementia Mother    Stroke Father        3 strokes   Hypertension Father    Breast cancer Paternal Grandmother    Colon cancer Neg Hx    Social History   Socioeconomic History   Marital status: Married    Spouse name: Not on file   Number of children: 2   Years of education: Not on file   Highest education level: Not on file  Occupational History   Not on file  Tobacco Use   Smoking status: Never   Smokeless tobacco: Never  Vaping Use   Vaping Use: Never used  Substance and Sexual Activity   Alcohol use: Yes    Alcohol/week: 0.0 standard drinks of alcohol    Comment: occ. wine   Drug use: No   Sexual activity: Not on file  Other Topics Concern   Not on file  Social History Narrative   Right handed    Caffeine use: coffee (1/2 cup per  day)   Social Determinants of Health   Financial Resource Strain: Not on file  Food Insecurity: Not on file  Transportation Needs: Not on file  Physical Activity: Not on file  Stress: Not on file  Social Connections: Not on file     Review of Systems  Constitutional:  Negative for appetite change and unexpected weight change.  HENT:  Negative for congestion, sinus pressure and sore throat.   Eyes:  Negative for pain and visual disturbance.  Respiratory:  Negative for cough, chest tightness and shortness of breath.   Cardiovascular:  Negative for chest pain and palpitations.       No increased leg swelling.   Gastrointestinal:  Negative for abdominal pain, constipation and diarrhea.  Genitourinary:  Negative for difficulty urinating and dysuria.  Musculoskeletal:  Negative for back pain and joint swelling.  Skin:  Negative for color change and rash.  Neurological:  Negative for dizziness, light-headedness and  headaches.  Hematological:  Negative for adenopathy. Does not bruise/bleed easily.  Psychiatric/Behavioral:  Negative for agitation and dysphoric mood.        Objective:     BP 128/82 (BP Location: Left Arm, Patient Position: Sitting, Cuff Size: Large)   Pulse 74   Temp 98.6 F (37 C) (Temporal)   Resp 16   Ht 5' (1.524 m)   Wt 197 lb 9.6 oz (89.6 kg)   SpO2 98%   BMI 38.59 kg/m  Wt Readings from Last 3 Encounters:  03/25/22 197 lb 9.6 oz (89.6 kg)  03/18/22 196 lb (88.9 kg)  02/27/22 196 lb 9.6 oz (89.2 kg)    Physical Exam Vitals reviewed.  Constitutional:      General: She is not in acute distress.    Appearance: Normal appearance. She is well-developed.  HENT:     Head: Normocephalic and atraumatic.     Right Ear: External ear normal.     Left Ear: External ear normal.  Eyes:     General: No scleral icterus.       Right eye: No discharge.        Left eye: No discharge.     Conjunctiva/sclera: Conjunctivae normal.  Neck:     Thyroid: No thyromegaly.  Cardiovascular:     Rate and Rhythm: Normal rate and regular rhythm.  Pulmonary:     Effort: No tachypnea, accessory muscle usage or respiratory distress.     Breath sounds: Normal breath sounds. No decreased breath sounds or wheezing.  Chest:  Breasts:    Right: No inverted nipple, mass, nipple discharge or tenderness (no axillary adenopathy).     Left: No inverted nipple, mass, nipple discharge or tenderness (no axilarry adenopathy).  Abdominal:     General: Bowel sounds are normal.     Palpations: Abdomen is soft.     Tenderness: There is no abdominal tenderness.  Genitourinary:    Comments: Normal external genitalia.  Vaginal vault without lesions.  Cervix identified.  Pap smear performed.  Could not appreciate any adnexal masses or tenderness.   Musculoskeletal:        General: No swelling or tenderness.     Cervical back: Neck supple. No tenderness.  Lymphadenopathy:     Cervical: No cervical  adenopathy.  Skin:    Findings: No erythema or rash.     Comments: Raised lesion - lower leg with surrounding erythema with smaller raised area below.  No tenderness to palpation.  No erythema extending up the leg.   Neurological:  Mental Status: She is alert and oriented to person, place, and time.  Psychiatric:        Mood and Affect: Mood normal.        Behavior: Behavior normal.      Outpatient Encounter Medications as of 03/25/2022  Medication Sig   acetaminophen (TYLENOL) 650 MG CR tablet Take by mouth.   amitriptyline (ELAVIL) 25 MG tablet TAKE 1 TABLET(25 MG) BY MOUTH AT BEDTIME.   amLODipine (NORVASC) 5 MG tablet TAKE 1 TABLET(5 MG) BY MOUTH TWICE DAILY   atenolol (TENORMIN) 25 MG tablet TAKE 1 TABLET BY MOUTH EVERY DAY   atorvastatin (LIPITOR) 40 MG tablet TAKE 1 TABLET BY MOUTH EVERY DAY   doxycycline (VIBRA-TABS) 100 MG tablet Take 1 tablet (100 mg total) by mouth 2 (two) times daily.   ELIQUIS 5 MG TABS tablet TAKE 1 TABLET BY MOUTH TWICE DAILY   furosemide (LASIX) 20 MG tablet Take 1 tablet (20 mg total) by mouth daily as needed. Sparingly for leg swelling   lamoTRIgine (LAMICTAL) 100 MG tablet TAKE 1 TABLET(100 MG) BY MOUTH TWICE DAILY   meloxicam (MOBIC) 7.5 MG tablet Take 1 tablet (7.5 mg total) by mouth daily as needed for pain.   Semaglutide-Weight Management (WEGOVY) 0.25 MG/0.5ML SOAJ Inject 0.25 mg into the skin once a week.   [DISCONTINUED] losartan (COZAAR) 100 MG tablet TAKE 1 TABLET(100 MG) BY MOUTH DAILY   losartan (COZAAR) 100 MG tablet Take 1 tablet (100 mg total) by mouth daily.   No facility-administered encounter medications on file as of 03/25/2022.     Lab Results  Component Value Date   WBC 8.8 12/24/2021   HGB 16.3 (H) 12/24/2021   HCT 48.8 (H) 12/24/2021   PLT 228.0 12/24/2021   GLUCOSE 110 (H) 03/17/2022   CHOL 161 11/29/2021   TRIG 97.0 11/29/2021   HDL 46.90 11/29/2021   LDLCALC 95 11/29/2021   ALT 22 11/29/2021   AST 22 11/29/2021    NA 139 03/17/2022   K 4.5 03/17/2022   CL 105 03/17/2022   CREATININE 1.35 (H) 03/17/2022   BUN 26 (H) 03/17/2022   CO2 23 03/17/2022   TSH 3.52 11/29/2021   INR 1.5 (H) 06/03/2020   HGBA1C 6.3 11/29/2021   MICROALBUR 35.5 (H) 04/08/2016    MM 3D SCREEN BREAST BILATERAL  Result Date: 07/10/2021 CLINICAL DATA:  Screening. EXAM: DIGITAL SCREENING BILATERAL MAMMOGRAM WITH TOMOSYNTHESIS AND CAD TECHNIQUE: Bilateral screening digital craniocaudal and mediolateral oblique mammograms were obtained. Bilateral screening digital breast tomosynthesis was performed. The images were evaluated with computer-aided detection. COMPARISON:  Previous exam(s). ACR Breast Density Category b: There are scattered areas of fibroglandular density. FINDINGS: There are no findings suspicious for malignancy. IMPRESSION: No mammographic evidence of malignancy. A result letter of this screening mammogram will be mailed directly to the patient. RECOMMENDATION: Screening mammogram in one year. (Code:SM-B-01Y) BI-RADS CATEGORY  1: Negative. Electronically Signed   By: Audie Pinto M.D.   On: 07/10/2021 15:27      Assessment & Plan:   Problem List Items Addressed This Visit     Acquired thrombophilia ([place])    afib on eliquis.        CKD (chronic kidney disease) stage 3, GFR 30-59 ml/min (HCC)    Recently stopped hctz.  Has lasix to take prn.  Continues on losartan.  GFR 03/17/22 - 40. Improved some.  Continue f/u with nephrology.  Follow metabolic panel.  Try to avoid meloxicam.  Elevated hemoglobin (HCC)    Follow cbc.       Health care maintenance    Physical today 03/25/22.  PAP 11/29/19 - negative with negative HPV. PAP 7/25/234.  Mammogram 07/10/21 - Birads I.  Colonoscopy 08/2016 - recommended f/u in 5 years.       Hypercholesterolemia    Continue Lipitor.  Low cholesterol diet and exercise.  Follow lipid panel liver function test.      Relevant Medications   losartan (COZAAR) 100 MG tablet    Other Relevant Orders   Hepatic function panel   Lipid Profile   Hyperglycemia    Low-carb diet and exercise.  Follow met b and A1c.      Relevant Orders   HgB A1c   Hypertension    Blood pressure as outlined.  Continue Tenormin and amlodipine.  Now on losartan 166m q day. Follow pressures. No evidence of volume overload today.  Hold lasix.  Follow metabolic panel.       Relevant Medications   losartan (COZAAR) 100 MG tablet   Other Relevant Orders   Basic Metabolic Panel (BMET)   Immunization counseling    Needs pneumonia vaccine. Discussed.  Will hold given current infection.  Will return for prevnar 20.       Leg skin lesion, right    Raised lesion with surrounding erythema.  No redness extending up the leg. Question of bite.  Warm compresses.  Reports has improved some.  Doxycycline as directed.  Discussed probiotics.  Call with update.       Meningioma ([facility]    Being followed by neurology.  Status post gamma knife treatment.  No pain currently.  Seeing Dr. SFelecia Shelling  Currently on Lamictal and amitriptyline.      PAD (peripheral artery disease) (HCC)    Status post angioplasty.  On Eliquis.  Continue risk factor modification.  Continue statin.      Relevant Medications   losartan (COZAAR) 100 MG tablet   Paroxysmal atrial fibrillation (HCC)    On amiodarone and atenolol.  Continue Eliquis.  Stable.      Relevant Medications   losartan (COZAAR) 100 MG tablet   Other Visit Diagnoses     Routine general medical examination at a health care facility    -  Primary   Estrogen deficiency       Relevant Orders   DG Bone Density   Screening for cervical cancer       Relevant Orders   Cytology - PAP( [place]) (Completed)        CEinar Pheasant MD

## 2022-03-25 NOTE — Assessment & Plan Note (Addendum)
Physical today 03/25/22.  PAP 11/29/19 - negative with negative HPV. PAP 7/25/234.  Mammogram 07/10/21 - Birads I.  Colonoscopy 08/2016 - recommended f/u in 5 years.

## 2022-03-26 LAB — CYTOLOGY - PAP
Comment: NEGATIVE
Diagnosis: NEGATIVE
High risk HPV: NEGATIVE

## 2022-03-28 ENCOUNTER — Telehealth: Payer: Self-pay

## 2022-03-28 NOTE — Telephone Encounter (Signed)
Patient states Dr. [name] asked her to call to give her an update on her spider bite from earlier this week.  Patient states the area is still red, but much better than it was.

## 2022-03-28 NOTE — Telephone Encounter (Signed)
Thanks for the update.  Complete abx.  Continue as she is doing.  Keep [place] updated.

## 2022-03-28 NOTE — Telephone Encounter (Signed)
  S/w pt - stated area doing better! Not as swollen as it was, less redness/bruise looking. Not as big area looking, still some fluid coming out. Clear - has been draining for 1 1/2 days straight. No heat, not painful to the touch.  Will keep [place] updated as to status.

## 2022-03-31 ENCOUNTER — Encounter: Payer: Self-pay | Admitting: Internal Medicine

## 2022-03-31 DIAGNOSIS — L989 Disorder of the skin and subcutaneous tissue, unspecified: Secondary | ICD-10-CM | POA: Insufficient documentation

## 2022-03-31 DIAGNOSIS — Z7185 Encounter for immunization safety counseling: Secondary | ICD-10-CM | POA: Insufficient documentation

## 2022-03-31 NOTE — Assessment & Plan Note (Signed)
afib on eliquis.  

## 2022-03-31 NOTE — Assessment & Plan Note (Signed)
Being followed by neurology.  Status post gamma knife treatment.  No pain currently.  Seeing Dr. Sater.  Currently on Lamictal and amitriptyline. 

## 2022-03-31 NOTE — Assessment & Plan Note (Signed)
Recently stopped hctz.  Has lasix to take prn.  Continues on losartan.  GFR 03/17/22 - 40. Improved some.  Continue f/u with nephrology.  Follow metabolic panel.  Try to avoid meloxicam.

## 2022-03-31 NOTE — Assessment & Plan Note (Signed)
On amiodarone and atenolol.  Continue Eliquis.  Stable. 

## 2022-03-31 NOTE — Assessment & Plan Note (Signed)
Needs pneumonia vaccine. Discussed.  Will hold given current infection.  Will return for prevnar 20.

## 2022-03-31 NOTE — Assessment & Plan Note (Signed)
Status post angioplasty.  On Eliquis.  Continue risk factor modification.  Continue statin. 

## 2022-03-31 NOTE — Assessment & Plan Note (Signed)
Raised lesion with surrounding erythema.  No redness extending up the leg. Question of bite.  Warm compresses.  Reports has improved some.  Doxycycline as directed.  Discussed probiotics.  Call with update.

## 2022-03-31 NOTE — Assessment & Plan Note (Signed)
Blood pressure as outlined.  Continue Tenormin and amlodipine.  Now on losartan '100mg'$  q day. Follow pressures. No evidence of volume overload today.  Hold lasix.  Follow metabolic panel.

## 2022-03-31 NOTE — Assessment & Plan Note (Signed)
Low-carb diet and exercise.  Follow met b and A1c. ?

## 2022-03-31 NOTE — Assessment & Plan Note (Signed)
Follow cbc.  

## 2022-03-31 NOTE — Assessment & Plan Note (Signed)
Continue Lipitor.  Low-cholesterol diet and exercise.  Follow lipid panel liver function test. 

## 2022-04-01 ENCOUNTER — Telehealth: Payer: Self-pay

## 2022-04-01 NOTE — Telephone Encounter (Signed)
Amelia Jo (KeyLizbeth Bark) [id number] 0.'25MG'$ /0.5ML auto-injectors   Form [employer] Electronic Prior Authorization Form 2017 NCPDP Created 11 minutes ago Sent to Plan 7 minutes ago Plan Response 7 minutes ago Submit Clinical Questions less than a minute ago Determination Wait for Determination Please wait for [place] 2017 to return a determination.

## 2022-04-01 NOTE — Telephone Encounter (Signed)
Dexa sched for 8/7 at 940am Pt advised

## 2022-04-02 NOTE — Telephone Encounter (Signed)
Pt advised - weight loss drugs are excluded class on [employer]

## 2022-04-02 NOTE — Telephone Encounter (Signed)
Amelia Jo (KeyLizbeth Bark) GWLTKC 0.'25MG'$ /0.5ML auto-injectors   Form [employer] Medicare Electronic Prior Authorization Form 2017 NCPDP Created 1 day ago Sent to Plan 1 day ago Plan Response 1 day ago Submit Clinical Questions 1 day ago Determination Unfavorable 1 day ago Message from Plan We have denied your request as the diagnosis submitted is considered to be part of an excluded (not covered) category, medications utilized to promote weight loss, by the [employer] for [employer] and Medicaid Services (CMS). Section 1927(d)(2) of the S

## 2022-04-02 NOTE — Telephone Encounter (Signed)
Patient called and would like to know why her Mancel Parsons was denied.

## 2022-04-02 NOTE — Telephone Encounter (Signed)
**  healthteam advantage

## 2022-04-07 ENCOUNTER — Ambulatory Visit
Admission: RE | Admit: 2022-04-07 | Discharge: 2022-04-07 | Disposition: A | Discharge status: Home / Self Care | Payer: PPO | Source: Ambulatory Visit | Attending: Internal Medicine | Admitting: Internal Medicine

## 2022-04-07 DIAGNOSIS — E2839 Other primary ovarian failure: Secondary | ICD-10-CM | POA: Insufficient documentation

## 2022-04-07 DIAGNOSIS — Z780 Asymptomatic menopausal state: Secondary | ICD-10-CM | POA: Diagnosis not present

## 2022-04-08 ENCOUNTER — Ambulatory Visit (INDEPENDENT_AMBULATORY_CARE_PROVIDER_SITE_OTHER): Payer: PPO

## 2022-04-08 DIAGNOSIS — Z23 Encounter for immunization: Secondary | ICD-10-CM

## 2022-04-08 NOTE — Progress Notes (Signed)
Pt arrived for Prevnar 20 vaccine, given in L deltoid. Pt tolerated injection well, showed no signs of distress nor voiced any concerns.

## 2022-04-29 DIAGNOSIS — E785 Hyperlipidemia, unspecified: Secondary | ICD-10-CM | POA: Diagnosis not present

## 2022-04-29 DIAGNOSIS — N1832 Chronic kidney disease, stage 3b: Secondary | ICD-10-CM | POA: Diagnosis not present

## 2022-04-29 DIAGNOSIS — Z6839 Body mass index (BMI) 39.0-39.9, adult: Secondary | ICD-10-CM | POA: Diagnosis not present

## 2022-04-29 DIAGNOSIS — I10 Essential (primary) hypertension: Secondary | ICD-10-CM | POA: Diagnosis not present

## 2022-05-06 NOTE — Progress Notes (Unsigned)
Cardiology Office Note  Date:  05/07/2022   ID:  Kristin James, DOB 02/23/1956, MRN [id number]  PCP:  [name], MD   Chief Complaint  Patient presents with   6 month follow up     "Doing well." Medications reviewed by the patient verbally.     HPI:  Kristin James is a pleasant 66 year old woman with history of  paroxysmal atrial fibrillation, no sx HTN,  chronic back pain,  spinal stenosis,  obesity,   Trigeminal neuralgia hospital in September 2015 for acute left lower extremity arterial ischemia secondary to thrombus.  She presents for routine followup of her atrial fibrillation  LOV 3/23 In follow-up today reports that she is doing well Chronic trace lower extremity edema left leg, minimal on the right Prior surgery left knee Walks on treadmill, no SOB or chest discomfort Does not appreciate atrial fibrillation Balance problem, back issue Previously seen by Dr. Carloyn Manner who has since retired Thinking of going on Wegovy, has not been able to get any secondary to pharmacy not stocking.  Would like to lose weight  On last office visit was inclined not to pursue medications or other intervention to restore normal sinus rhythm as she was asymptomatic  Lab work reviewed CR 1.35, BUN 26  EKG personally reviewed by myself on todays visit Atrial fibrillation ventricular rate 86 bpm no significant ST-T wave changes  Other past medical history reviewed  tumor pressing on her fifth cranial nerve Had gamma knife treatment in the past followed with MRI yearly   Trigeminal neuralgia x 6 months. She has seen several specialists, Including prednisone, Neurontin, trileptal among others  Severe pain left side of face, shocking pain,   back surgery March 2016, lumbar region Prior office visit, EKG documented atrial fibrillation. Started on amiodarone, diltiazem, beta blocker and she converted to normal sinus rhythm. Atenolol dose decreased secondary to bradycardia   admitted to the  hospital on May 21 2014 with acute pain in her left leg. She had no distal pulses. Her creatinine was 1.99 which improved to 1.0 with fluids. She underwent angiogram of the left lower extremity with thrombolysis with TPA of the left pill, tibial peroneal trunk, peroneal arteries and distally, thrombectomy of the vessel with restoration of flow. she does not have any sleep apnea, does have occasional snoring   Stress test was done in the hospital for chest pain. This showed no ischemia. Ejection fraction 67% EKG in the hospital showed normal sinus rhythm with rate 74 beats per minute. EKG dated 05/21/2014    PMH:   has a past medical history of Afib ([place]), Cellulitis of right arm, Embolus to lower extremity ([place]), Hiatal hernia, History of echocardiogram, History of stress test, Hypercholesteremia, Hyperglycemia, Hypertension, Left Cerebellopontine Angle Meningioma ([place]), Osteoarthritis, PAF (paroxysmal atrial fibrillation) ([place]), Proteinuria, Shingles, Spinal stenosis at L4-L5 level, Thrombophlebitis of posterior tibial vein ([place]) (06/01/2014), and Trigeminal neuralgia (08/2016).  PSH:    Past Surgical History:  Procedure Laterality Date   AORTOGRAM     BACK SURGERY  11/13/2014   CARDIOVERSION N/A 07/19/2020   Procedure: CARDIOVERSION;  Surgeon: Minna Merritts, MD;  Location: [facility] ORS;  Service: Cardiovascular;  Laterality: N/A;   CHOLECYSTECTOMY     COLONOSCOPY WITH PROPOFOL N/A 08/15/2016   Procedure: COLONOSCOPY WITH PROPOFOL;  Surgeon: Lollie Sails, MD;  Location: [facility] ENDOSCOPY;  Service: Endoscopy;  Laterality: N/A;   KNEE ARTHROSCOPY     LOWER EXTREMITY ANGIOGRAM      Current Outpatient Medications  Medication Sig Dispense Refill   acetaminophen (TYLENOL) 650 MG CR tablet Take by mouth.     amitriptyline (ELAVIL) 25 MG tablet TAKE 1 TABLET(25 MG) BY MOUTH AT BEDTIME. 90 tablet 4   amLODipine (NORVASC) 5 MG tablet TAKE 1 TABLET(5 MG) BY MOUTH TWICE DAILY 180 tablet 1    atenolol (TENORMIN) 25 MG tablet TAKE 1 TABLET BY MOUTH EVERY DAY 30 tablet 5   atorvastatin (LIPITOR) 40 MG tablet TAKE 1 TABLET BY MOUTH EVERY DAY 90 tablet 3   ELIQUIS 5 MG TABS tablet TAKE 1 TABLET BY MOUTH TWICE DAILY 180 tablet 1   furosemide (LASIX) 20 MG tablet Take 1 tablet (20 mg total) by mouth daily as needed. Sparingly for leg swelling 45 tablet 3   lamoTRIgine (LAMICTAL) 100 MG tablet TAKE 1 TABLET(100 MG) BY MOUTH TWICE DAILY 180 tablet 3   lamoTRIgine (LAMICTAL) 100 MG tablet Take 100 mg by mouth 2 (two) times daily.     losartan (COZAAR) 100 MG tablet Take 1 tablet (100 mg total) by mouth daily. 90 tablet 1   doxycycline (VIBRA-TABS) 100 MG tablet Take 1 tablet (100 mg total) by mouth 2 (two) times daily. (Patient not taking: Reported on 05/07/2022) 14 tablet 0   meloxicam (MOBIC) 7.5 MG tablet Take 1 tablet (7.5 mg total) by mouth daily as needed for pain. (Patient not taking: Reported on 05/07/2022) 30 tablet 0   Semaglutide-Weight Management (WEGOVY) 0.25 MG/0.5ML SOAJ Inject 0.25 mg into the skin once a week. (Patient not taking: Reported on 05/07/2022) 2 mL 1   No current facility-administered medications for this visit.    Allergies:   Patient has no known allergies.   Social History:  The patient  reports that she has never smoked. She has never used smokeless tobacco. She reports current alcohol use. She reports that she does not use drugs.   Family History:   family history includes Breast cancer in her paternal grandmother; Dementia in her mother; Hypertension in her father and mother; Stroke in her father.    Review of Systems: Review of Systems  Constitutional: Negative.   HENT: Negative.         Pain right side of her head  Respiratory: Negative.    Cardiovascular: Negative.   Gastrointestinal: Negative.   Musculoskeletal: Negative.   Neurological: Negative.        Balance issues  Psychiatric/Behavioral: Negative.    All other systems reviewed and are  negative.   PHYSICAL EXAM: VS:  BP 110/62 (BP Location: Left Arm, Patient Position: Sitting, Cuff Size: Normal)   Pulse 86   Ht 5' (1.524 m)   Wt 201 lb (91.2 kg)   SpO2 98%   BMI 39.26 kg/m  , BMI Body mass index is 39.26 kg/m. Constitutional:  oriented to person, place, and time. No distress.  HENT:  Head: Grossly normal Eyes:  no discharge. No scleral icterus.  Neck: No JVD, no carotid bruits  Cardiovascular: Regular rate and rhythm, no murmurs appreciated Pulmonary/Chest: Clear to auscultation bilaterally, no wheezes or rails Abdominal: Soft.  no distension.  no tenderness.  Musculoskeletal: Normal range of motion Neurological:  normal muscle tone. Coordination normal. No atrophy Skin: Skin warm and dry Psychiatric: normal affect, pleasant  Recent Labs: 11/29/2021: ALT 22; TSH 3.52 12/24/2021: Hemoglobin 16.3; Platelets 228.0 03/17/2022: BUN 26; Creatinine, Ser 1.35; Potassium 4.5; Sodium 139   Lipid Panel Lab Results  Component Value Date   CHOL 161 11/29/2021   HDL 46.90 11/29/2021  Adrian 95 11/29/2021   TRIG 97.0 11/29/2021     Wt Readings from Last 3 Encounters:  05/07/22 201 lb (91.2 kg)  03/25/22 197 lb 9.6 oz (89.6 kg)  03/18/22 196 lb (88.9 kg)     ASSESSMENT AND PLAN:  Essential hypertension Blood pressure is well controlled on today's visit. No changes made to the medications.  Hypercholesterolemia Encouraged her to stay on her Lipitor Low carbohydrate diet ,weight loss recommended She is scheduled to start [facility] when she can obtain this from the pharmacy  Morbid obesity ([place]) Recommend she continue her exercise program, calorie restriction  Chronic low back pain Currently not on NSAIDs Looking to reestablish with neurosurgery  Atrial fibrillation with RVR ([place]) - Plan: EKG 12-Lead On Eliquis, beta-blocker, she is inclined to continue rate control as she is asymptomatic discussion concerning risk and benefit of atrial  fibrillation declining any intervention  Trigeminal neuralgia of left side of face Followed on annual basis with MRI  Chronic renal insufficiency Creatinine 1.3, stable Would avoid NSAIDs   Total encounter time more than 30 minutes  Greater than 50% was spent in counseling and coordination of care with the patient     Orders Placed This Encounter  Procedures   EKG 12-Lead     Signed, Esmond Plants, M.D., Ph.D. 05/07/2022  [place], [place] [phone]

## 2022-05-07 ENCOUNTER — Ambulatory Visit: Payer: PPO | Attending: Cardiovascular Disease | Admitting: Cardiovascular Disease

## 2022-05-07 ENCOUNTER — Encounter: Payer: Self-pay | Admitting: Cardiovascular Disease

## 2022-05-07 ENCOUNTER — Other Ambulatory Visit: Payer: PPO

## 2022-05-07 VITALS — BP 110/62 | HR 86 | Ht 60.0 in | Wt 201.0 lb

## 2022-05-07 DIAGNOSIS — I10 Essential (primary) hypertension: Secondary | ICD-10-CM | POA: Diagnosis not present

## 2022-05-07 DIAGNOSIS — N183 Chronic kidney disease, stage 3 unspecified: Secondary | ICD-10-CM

## 2022-05-07 DIAGNOSIS — E782 Mixed hyperlipidemia: Secondary | ICD-10-CM | POA: Diagnosis not present

## 2022-05-07 DIAGNOSIS — I480 Paroxysmal atrial fibrillation: Secondary | ICD-10-CM

## 2022-05-07 NOTE — Patient Instructions (Addendum)
Medication Instructions: Your physician recommends that you continue on your current medications as directed. Please refer to the Current Medication list given to you today.    If you need a refill on your cardiac medications before your next appointment, please call your pharmacy.   Lab work: No new labs needed  Testing/Procedures: No new testing needed  Follow-Up: At [facility], you and your health needs are our priority.  As part of our continuing mission to provide you with exceptional heart care, we have created designated Provider Care Teams.  These Care Teams include your primary Cardiologist (physician) and Advanced Practice Providers (APPs -  Physician Assistants and Nurse Practitioners) who all work together to provide you with the care you need, when you need it.  You will need a follow up appointment in 6 months  Providers on your designated Care Team:   Christopher Berge, NP Ryan Dunn, PA-C Cadence Furth, PA-C  COVID-19 Vaccine Information can be found at: [email or web address] For questions related to vaccine distribution or appointments, please email [email or web address] or call [phone].   

## 2022-05-08 ENCOUNTER — Other Ambulatory Visit (INDEPENDENT_AMBULATORY_CARE_PROVIDER_SITE_OTHER): Payer: PPO

## 2022-05-08 ENCOUNTER — Telehealth: Payer: Self-pay | Admitting: Cardiovascular Disease

## 2022-05-08 ENCOUNTER — Telehealth: Payer: Self-pay | Admitting: Internal Medicine

## 2022-05-08 DIAGNOSIS — E7800 Pure hypercholesterolemia, unspecified: Secondary | ICD-10-CM

## 2022-05-08 DIAGNOSIS — I10 Essential (primary) hypertension: Secondary | ICD-10-CM

## 2022-05-08 DIAGNOSIS — D582 Other hemoglobinopathies: Secondary | ICD-10-CM

## 2022-05-08 DIAGNOSIS — R739 Hyperglycemia, unspecified: Secondary | ICD-10-CM

## 2022-05-08 LAB — BASIC METABOLIC PANEL
BUN: 32 mg/dL — ABNORMAL HIGH (ref 6–23)
CO2: 23 mEq/L (ref 19–32)
Calcium: 9.3 mg/dL (ref 8.4–10.5)
Chloride: 106 mEq/L (ref 96–112)
Creatinine, Ser: 1.3 mg/dL — ABNORMAL HIGH (ref 0.40–1.20)
GFR: 42.75 mL/min — ABNORMAL LOW (ref >60.00)
Glucose, Bld: 113 mg/dL — ABNORMAL HIGH (ref 70–99)
Potassium: 4.3 mEq/L (ref 3.5–5.1)
Sodium: 140 mEq/L (ref 135–145)

## 2022-05-08 LAB — HEPATIC FUNCTION PANEL
ALT: 18 U/L (ref 0–35)
AST: 19 U/L (ref 0–37)
Albumin: 4.1 g/dL (ref 3.5–5.2)
Alkaline Phosphatase: 118 U/L — ABNORMAL HIGH (ref 39–117)
Bilirubin, Direct: 0.2 mg/dL (ref 0.0–0.3)
Total Bilirubin: 0.8 mg/dL (ref 0.2–1.2)
Total Protein: 6.8 g/dL (ref 6.0–8.3)

## 2022-05-08 LAB — LIPID PANEL
Cholesterol: 168 mg/dL (ref 0–200)
HDL: 52.4 mg/dL (ref >39.00)
LDL Cholesterol: 95 mg/dL (ref 0–99)
NonHDL: 115.83
Total CHOL/HDL Ratio: 3
Triglycerides: 106 mg/dL (ref 0.0–149.0)
VLDL: 21.2 mg/dL (ref 0.0–40.0)

## 2022-05-08 LAB — CBC WITH DIFFERENTIAL/PLATELET
Basophils Absolute: 0.1 10*3/uL (ref 0.0–0.1)
Basophils Relative: 1.8 % (ref 0.0–3.0)
Eosinophils Absolute: 0.1 10*3/uL (ref 0.0–0.7)
Eosinophils Relative: 1.9 % (ref 0.0–5.0)
HCT: 45.2 % (ref 36.0–46.0)
Hemoglobin: 15.2 g/dL — ABNORMAL HIGH (ref 12.0–15.0)
Lymphocytes Relative: 23.6 % (ref 12.0–46.0)
Lymphs Abs: 1.5 10*3/uL (ref 0.7–4.0)
MCHC: 33.6 g/dL (ref 30.0–36.0)
MCV: 95.1 fl (ref 78.0–100.0)
Monocytes Absolute: 0.6 10*3/uL (ref 0.1–1.0)
Monocytes Relative: 9.1 % (ref 3.0–12.0)
Neutro Abs: 4.1 10*3/uL (ref 1.4–7.7)
Neutrophils Relative %: 63.6 % (ref 43.0–77.0)
Platelets: 217 10*3/uL (ref 150.0–400.0)
RBC: 4.76 Mil/uL (ref 3.87–5.11)
RDW: 13.6 % (ref 11.5–15.5)
WBC: 6.4 10*3/uL (ref 4.0–10.5)

## 2022-05-08 LAB — HEMOGLOBIN A1C: Hgb A1c MFr Bld: 6.3 % (ref 4.6–6.5)

## 2022-05-08 NOTE — Telephone Encounter (Signed)
Patient states her insurance will not cover Wegovy unless she receives a letter stating it is necessary to take. She would like to discuss this with Dr. Donivan Scull nurse if possible.

## 2022-05-08 NOTE — Telephone Encounter (Signed)
Patient called Kristin James back to let her know that the fax is coming from Dr Radene Knee, [facility]

## 2022-05-08 NOTE — Telephone Encounter (Signed)
Called and left a detailed VM per DPR on file informing patient that she would have to call her PCP to handle this as they are the prescribing office. It looks like they are already working on it in the available documentation.  Encourage patient to call back if she had any further questions or concerns.

## 2022-05-08 NOTE — Telephone Encounter (Signed)
Called and spoke to patient she basically wanted to inform us that she had not gotten her Kristin James because she would have to pay out of pocket and she stated that her other Dr informed her that she was borderline diabetic and that should help her  if we do  a PA so that her insurance would cover it. Patient stated that she would get them to send something over so that we could use it when doing the PA

## 2022-05-08 NOTE — Telephone Encounter (Signed)
Please call and get more information.  I want to make sure nothing urgent going on?

## 2022-05-08 NOTE — Telephone Encounter (Signed)
Janett Billow, can you help with this?  Thanks.

## 2022-05-08 NOTE — Telephone Encounter (Signed)
Information has been placed on Kristin James's desk.

## 2022-05-08 NOTE — Telephone Encounter (Signed)
Pt came in for an appointment and stated she wanted the provider to call her

## 2022-05-08 NOTE — Telephone Encounter (Signed)
Fax has been received

## 2022-05-08 NOTE — Telephone Encounter (Signed)
[name]. Need to find out what doctor or office is faxing something over so we know what to look for.

## 2022-05-09 ENCOUNTER — Other Ambulatory Visit: Payer: Self-pay | Admitting: Internal Medicine

## 2022-05-09 DIAGNOSIS — R748 Abnormal levels of other serum enzymes: Secondary | ICD-10-CM

## 2022-05-09 NOTE — Progress Notes (Signed)
Order placed for f/u labs.  

## 2022-05-22 ENCOUNTER — Other Ambulatory Visit: Payer: Self-pay | Admitting: Cardiovascular Disease

## 2022-05-23 ENCOUNTER — Other Ambulatory Visit: Payer: Self-pay | Admitting: Cardiovascular Disease

## 2022-05-23 NOTE — Telephone Encounter (Signed)
Outpatient Medication Detail   Disp Refills Start End   atenolol (TENORMIN) 25 MG tablet 30 tablet 6 05/22/2022    Sig: TAKE 1 TABLET BY MOUTH EVERY DAY   Sent to pharmacy as: atenolol (TENORMIN) 25 MG tablet   E-Prescribing Status: Receipt confirmed by pharmacy (05/22/2022  8:38 AM EDT)    Pharmacy  [place], [place]

## 2022-05-30 ENCOUNTER — Other Ambulatory Visit: Payer: PPO

## 2022-05-30 ENCOUNTER — Other Ambulatory Visit (INDEPENDENT_AMBULATORY_CARE_PROVIDER_SITE_OTHER): Payer: PPO

## 2022-05-30 DIAGNOSIS — R748 Abnormal levels of other serum enzymes: Secondary | ICD-10-CM

## 2022-05-30 LAB — GAMMA GT: GGT: 47 U/L (ref 7–51)

## 2022-05-30 LAB — HEPATIC FUNCTION PANEL
ALT: 17 U/L (ref 0–35)
AST: 16 U/L (ref 0–37)
Albumin: 4.4 g/dL (ref 3.5–5.2)
Alkaline Phosphatase: 114 U/L (ref 39–117)
Bilirubin, Direct: 0.2 mg/dL (ref 0.0–0.3)
Total Bilirubin: 0.9 mg/dL (ref 0.2–1.2)
Total Protein: 7.3 g/dL (ref 6.0–8.3)

## 2022-06-13 ENCOUNTER — Other Ambulatory Visit: Payer: Self-pay | Admitting: Internal Medicine

## 2022-06-13 DIAGNOSIS — Z1231 Encounter for screening mammogram for malignant neoplasm of breast: Secondary | ICD-10-CM

## 2022-06-25 ENCOUNTER — Ambulatory Visit (INDEPENDENT_AMBULATORY_CARE_PROVIDER_SITE_OTHER): Payer: PPO | Admitting: Internal Medicine

## 2022-06-25 ENCOUNTER — Encounter: Payer: Self-pay | Admitting: Internal Medicine

## 2022-06-25 VITALS — BP 132/80 | HR 86 | Temp 99.0°F | Ht 60.0 in | Wt 204.2 lb

## 2022-06-25 DIAGNOSIS — E7800 Pure hypercholesterolemia, unspecified: Secondary | ICD-10-CM

## 2022-06-25 DIAGNOSIS — N1832 Chronic kidney disease, stage 3b: Secondary | ICD-10-CM

## 2022-06-25 DIAGNOSIS — I480 Paroxysmal atrial fibrillation: Secondary | ICD-10-CM

## 2022-06-25 DIAGNOSIS — I739 Peripheral vascular disease, unspecified: Secondary | ICD-10-CM | POA: Diagnosis not present

## 2022-06-25 DIAGNOSIS — R739 Hyperglycemia, unspecified: Secondary | ICD-10-CM | POA: Diagnosis not present

## 2022-06-25 DIAGNOSIS — D329 Benign neoplasm of meninges, unspecified: Secondary | ICD-10-CM | POA: Diagnosis not present

## 2022-06-25 DIAGNOSIS — E1165 Type 2 diabetes mellitus with hyperglycemia: Secondary | ICD-10-CM

## 2022-06-25 DIAGNOSIS — D6869 Other thrombophilia: Secondary | ICD-10-CM | POA: Diagnosis not present

## 2022-06-25 DIAGNOSIS — I10 Essential (primary) hypertension: Secondary | ICD-10-CM | POA: Diagnosis not present

## 2022-06-25 DIAGNOSIS — Z23 Encounter for immunization: Secondary | ICD-10-CM | POA: Diagnosis not present

## 2022-06-25 LAB — HEPATIC FUNCTION PANEL
ALT: 16 U/L (ref 0–35)
AST: 18 U/L (ref 0–37)
Albumin: 4.4 g/dL (ref 3.5–5.2)
Alkaline Phosphatase: 118 U/L — ABNORMAL HIGH (ref 39–117)
Bilirubin, Direct: 0.2 mg/dL (ref 0.0–0.3)
Total Bilirubin: 0.8 mg/dL (ref 0.2–1.2)
Total Protein: 7 g/dL (ref 6.0–8.3)

## 2022-06-25 LAB — BASIC METABOLIC PANEL
BUN: 32 mg/dL — ABNORMAL HIGH (ref 6–23)
CO2: 25 mEq/L (ref 19–32)
Calcium: 9.4 mg/dL (ref 8.4–10.5)
Chloride: 106 mEq/L (ref 96–112)
Creatinine, Ser: 1.15 mg/dL (ref 0.40–1.20)
GFR: 49.48 mL/min — ABNORMAL LOW (ref >60.00)
Glucose, Bld: 120 mg/dL — ABNORMAL HIGH (ref 70–99)
Potassium: 4.3 mEq/L (ref 3.5–5.1)
Sodium: 140 mEq/L (ref 135–145)

## 2022-06-25 MED ORDER — SEMAGLUTIDE(0.25 OR 0.5MG/DOS) 2 MG/3ML ~~LOC~~ SOPN: 0.2500 mg | PEN_INJECTOR | SUBCUTANEOUS | 2 refills | Status: DC

## 2022-06-25 NOTE — Progress Notes (Signed)
Patient ID: LAELYN BLUMENTHAL, female   DOB: 1956-03-24, 66 y.o.   MRN: [id number]   Subjective:    Patient ID: [name], female    DOB: March 28, 1956, 66 y.o.   MRN: [id number]   Patient here for  Chief Complaint  Patient presents with   Follow-up    3 month f/u   .   HPI Here to follow up regarding hypercholesterolemia, afib, CKD and hypertension.  Saw nephrology 04/29/22 - f/u CKD.  Instructed to hold meloxicam.  Reports she is doing ok off meloxicam.  Wants to start mounjaro.  Discussed with nephrology.  In reviewing, does have previous documented A1c of 6.5.  Discussed diabetes and treatment.  Discussed mounjaro and possible side effects.  No chest pain or sob reported.  No abdominal pain or bowel change reported.  Blood pressure - reports is ok.     Past Medical History:  Diagnosis Date   Afib ([place])    Cellulitis of right arm    Embolus to lower extremity ([place])    a. 05/2014 s/p thrombolysis of LLE.   Hiatal hernia    History of echocardiogram    a. 07/2020 Echo: EF 55-60%, no rwma, Nl RV size/fxn. Mildly dil LA. Mild MR/MS. Sev mitral annular Ca2+. Mild Ao sclerosis w/o stenosis.   History of stress test    a. 05/2014 lexiscan MV: EF 67%, no ischemia/infarct.   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 10/2016 s/p Gamma knife.   Osteoarthritis    PAF (paroxysmal atrial fibrillation) ([place])    a. Dx 10/2014->converted on amio. CHA2DS2VASc = 3-4-->Eliquis.   Proteinuria    Shingles    dec 2021   Spinal stenosis at L4-L5 level    Thrombophlebitis of posterior tibial vein ([place]) 06/01/2014   Trigeminal neuralgia 08/2016   Past Surgical History:  Procedure Laterality Date   AORTOGRAM     BACK SURGERY  11/13/2014   CARDIOVERSION N/A 07/19/2020   Procedure: CARDIOVERSION;  Surgeon: Minna Merritts, MD;  Location: [facility] ORS;  Service: Cardiovascular;  Laterality: N/A;   CHOLECYSTECTOMY     COLONOSCOPY WITH  PROPOFOL N/A 08/15/2016   Procedure: COLONOSCOPY WITH PROPOFOL;  Surgeon: Lollie Sails, MD;  Location: [facility] ENDOSCOPY;  Service: Endoscopy;  Laterality: N/A;   KNEE ARTHROSCOPY     LOWER EXTREMITY ANGIOGRAM     Family History  Problem Relation Age of Onset   Hypertension Mother    Dementia Mother    Stroke Father        3 strokes   Hypertension Father    Breast cancer Paternal Grandmother    Colon cancer Neg Hx    Social History   Socioeconomic History   Marital status: Married    Spouse name: Not on file   Number of children: 2   Years of education: Not on file   Highest education level: Not on file  Occupational History   Not on file  Tobacco Use   Smoking status: Never   Smokeless tobacco: Never  Vaping Use   Vaping Use: Never used  Substance and Sexual Activity   Alcohol use: Yes    Alcohol/week: 0.0 standard drinks of alcohol    Comment: occ. wine   Drug use: No   Sexual activity: Not on file  Other Topics Concern   Not on file  Social History Narrative   Right handed    Caffeine  use: coffee (1/2 cup per day)   Social Determinants of Health   Financial Resource Strain: Not on file  Food Insecurity: Not on file  Transportation Needs: Not on file  Physical Activity: Not on file  Stress: Not on file  Social Connections: Not on file     Review of Systems  Constitutional:  Negative for appetite change and unexpected weight change.  HENT:  Negative for congestion and sinus pressure.   Respiratory:  Negative for cough, chest tightness and shortness of breath.   Cardiovascular:  Negative for chest pain and palpitations.       No increased swelling.   Gastrointestinal:  Negative for abdominal pain, diarrhea, nausea and vomiting.  Genitourinary:  Negative for difficulty urinating and dysuria.  Musculoskeletal:  Negative for joint swelling and myalgias.  Skin:  Negative for color change and rash.  Neurological:  Negative for dizziness, light-headedness  and headaches.  Psychiatric/Behavioral:  Negative for agitation and dysphoric mood.        Objective:     BP 132/80 (BP Location: Left Arm, Patient Position: Sitting, Cuff Size: Normal)   Pulse 86   Temp 99 F (37.2 C) (Oral)   Ht 5' (1.524 m)   Wt 204 lb 3.2 oz (92.6 kg)   SpO2 98%   BMI 39.88 kg/m  Wt Readings from Last 3 Encounters:  06/25/22 204 lb 3.2 oz (92.6 kg)  05/07/22 201 lb (91.2 kg)  03/25/22 197 lb 9.6 oz (89.6 kg)    Physical Exam Vitals reviewed.  Constitutional:      General: She is not in acute distress.    Appearance: Normal appearance.  HENT:     Head: Normocephalic and atraumatic.     Right Ear: External ear normal.     Left Ear: External ear normal.  Eyes:     General: No scleral icterus.       Right eye: No discharge.        Left eye: No discharge.     Conjunctiva/sclera: Conjunctivae normal.  Neck:     Thyroid: No thyromegaly.  Cardiovascular:     Rate and Rhythm: Normal rate and regular rhythm.  Pulmonary:     Effort: No respiratory distress.     Breath sounds: Normal breath sounds. No wheezing.  Abdominal:     General: Bowel sounds are normal.     Palpations: Abdomen is soft.     Tenderness: There is no abdominal tenderness.  Musculoskeletal:        General: No swelling or tenderness.     Cervical back: Neck supple. No tenderness.  Lymphadenopathy:     Cervical: No cervical adenopathy.  Skin:    Findings: No erythema or rash.  Neurological:     Mental Status: She is alert.  Psychiatric:        Mood and Affect: Mood normal.        Behavior: Behavior normal.      Outpatient Encounter Medications as of 06/25/2022  Medication Sig   acetaminophen (TYLENOL) 650 MG CR tablet Take by mouth.   amitriptyline (ELAVIL) 25 MG tablet TAKE 1 TABLET(25 MG) BY MOUTH AT BEDTIME.   amLODipine (NORVASC) 5 MG tablet TAKE 1 TABLET(5 MG) BY MOUTH TWICE DAILY   atenolol (TENORMIN) 25 MG tablet TAKE 1 TABLET BY MOUTH EVERY DAY   atorvastatin  (LIPITOR) 40 MG tablet TAKE 1 TABLET BY MOUTH EVERY DAY   ELIQUIS 5 MG TABS tablet TAKE 1 TABLET BY MOUTH TWICE DAILY  furosemide (LASIX) 20 MG tablet Take 1 tablet (20 mg total) by mouth daily as needed. Sparingly for leg swelling   lamoTRIgine (LAMICTAL) 100 MG tablet TAKE 1 TABLET(100 MG) BY MOUTH TWICE DAILY   lamoTRIgine (LAMICTAL) 100 MG tablet Take 100 mg by mouth 2 (two) times daily.   losartan (COZAAR) 100 MG tablet Take 1 tablet (100 mg total) by mouth daily.   Semaglutide,0.25 or 0.5MG/DOS, 2 MG/3ML SOPN Inject 0.25 mg into the skin once a week.   [DISCONTINUED] doxycycline (VIBRA-TABS) 100 MG tablet Take 1 tablet (100 mg total) by mouth 2 (two) times daily. (Patient not taking: Reported on 05/07/2022)   [DISCONTINUED] meloxicam (MOBIC) 7.5 MG tablet Take 1 tablet (7.5 mg total) by mouth daily as needed for pain. (Patient not taking: Reported on 05/07/2022)   [DISCONTINUED] Semaglutide-Weight Management (WEGOVY) 0.25 MG/0.5ML SOAJ Inject 0.25 mg into the skin once a week. (Patient not taking: Reported on 06/25/2022)   No facility-administered encounter medications on file as of 06/25/2022.     Lab Results  Component Value Date   WBC 6.4 05/08/2022   HGB 15.2 (H) 05/08/2022   HCT 45.2 05/08/2022   PLT 217.0 05/08/2022   GLUCOSE 120 (H) 06/25/2022   CHOL 168 05/08/2022   TRIG 106.0 05/08/2022   HDL 52.40 05/08/2022   LDLCALC 95 05/08/2022   ALT 16 06/25/2022   AST 18 06/25/2022   NA 140 06/25/2022   K 4.3 06/25/2022   CL 106 06/25/2022   CREATININE 1.15 06/25/2022   BUN 32 (H) 06/25/2022   CO2 25 06/25/2022   TSH 3.52 11/29/2021   INR 1.5 (H) 06/03/2020   HGBA1C 6.3 05/08/2022   MICROALBUR 35.5 (H) 04/08/2016    DG Bone Density  Result Date: 04/07/2022 EXAM: DUAL X-RAY ABSORPTIOMETRY (DXA) FOR BONE MINERAL DENSITY IMPRESSION: Your patient Dawanna Grauberger completed a BMD test on 04/07/2022 using the [place] (software version: 14.10) manufactured by [employer] The following summarizes the results of our evaluation. Technologist:VLM PATIENT BIOGRAPHICAL: Name: Perris, Tripathi Patient ID: [id number] Birth Date: 10-Jul-1956 Height: 59.0 in. Gender: Female Exam Date: 04/07/2022 Weight: 198.0 lbs. Indications: Caucasian, Height Loss, Postmenopausal Fractures: Treatments: DENSITOMETRY RESULTS: Site         Region     Measured Date Measured Age WHO Classification Young Adult T-score BMD         %Change vs. Previous Significant Change (*) Left Forearm Radius 33% 04/07/2022 66.7 Normal 0.6 0.933 g/cm2 - - DualFemur Neck Right 04/07/2022 66.7 Normal -0.7 0.945 g/cm2 - - ASSESSMENT: The BMD measured at Femur Neck Right is 0.945 g/cm2 with a T-score of -0.7. This patient is considered normal according to [facility]) criteria. Lumbar spine was not utilized due to advanced degenerative changes and surgical hardware. The scan quality is good. World Pharmacologist [facility]) criteria for post-menopausal, Caucasian Women: Normal:                   T-score at or above -1 SD Osteopenia/low bone mass: T-score between -1 and -2.5 SD Osteoporosis:             T-score at or below -2.5 SD RECOMMENDATIONS: 1. All patients should optimize calcium and vitamin D intake. 2. Consider FDA-approved medical therapies in postmenopausal women and men aged 13 years and older, based on the following: a. A hip or vertebral(clinical or morphometric) fracture b. T-score < -2.5 at the femoral neck or spine after appropriate evaluation to  exclude secondary causes c. Low bone mass (T-score between -1.0 and -2.5 at the femoral neck or spine) and a 10-year probability of a hip fracture > 3% or a 10-year probability of a major osteoporosis-related fracture > 20% based on the [place]-adapted WHO algorithm 3. Clinician judgment and/or patient preferences may indicate treatment for people with 10-year fracture probabilities above or below these levels FOLLOW-UP: People with diagnosed cases of  osteoporosis or at high risk for fracture should have regular bone mineral density tests. For patients eligible for Medicare, routine testing is allowed once every 2 years. The testing frequency can be increased to one year for patients who have rapidly progressing disease, those who are receiving or discontinuing medical therapy to restore bone mass, or have additional risk factors. I have reviewed this report, and agree with the above findings. [facility] Electronically Signed   By: Abelardo Diesel M.D.   On: 04/07/2022 09:53       Assessment & Plan:   Problem List Items Addressed This Visit     Acquired thrombophilia ([place])    Afib on eliquis.      CKD (chronic kidney disease) stage 3, GFR 30-59 ml/min (HCC)    Off meloxicam now.  Seeing nephrology.  Recommended recheck metabolic panel today. Forward to nephrology.  Continue to avoid antiinflammatories. Stay hydrated.        Relevant Orders   Basic metabolic panel (Completed)   Hypercholesterolemia    Continue Lipitor.  Low cholesterol diet and exercise.  Follow lipid panel liver function test.      Relevant Orders   Hepatic function panel (Completed)   Hyperglycemia    Low-carb diet and exercise.  Follow met b and A1c.      Hypertension    Blood pressure as outlined.  Continue Tenormin and amlodipine.  On losartan 180m q day. Follow pressures. No evidence of volume overload today.  Follow metabolic panel.       Meningioma ([facility]    Being followed by neurology.  Status post gamma knife treatment.  No pain currently.  Seeing Dr. SFelecia Shelling  Currently on Lamictal and amitriptyline.      PAD (peripheral artery disease) (HCC)    Status post angioplasty.  On Eliquis.  Continue risk factor modification.  Continue statin.      Paroxysmal atrial fibrillation (HCC) - Primary    On amiodarone and atenolol.  Continue Eliquis.  Stable.      Type 2 diabetes mellitus with hyperglycemia (HCC)    Discussed diabetes.  Low carb  diet and exercise.  Discussed treatment options.  Start mounjaro.  Discussed possible side effects of medication.  Follow met b and A1c.       Relevant Medications   Semaglutide,0.25 or 0.5MG/DOS, 2 MG/3ML SOPN   Other Visit Diagnoses     Need for immunization against influenza       Relevant Orders   Flu Vaccine QUAD High Dose(Fluad) (Completed)        CEinar Pheasant MD

## 2022-07-06 ENCOUNTER — Encounter: Payer: Self-pay | Admitting: Internal Medicine

## 2022-07-06 NOTE — Assessment & Plan Note (Signed)
Off meloxicam now.  Seeing nephrology.  Recommended recheck metabolic panel today. Forward to nephrology.  Continue to avoid antiinflammatories. Stay hydrated.

## 2022-07-06 NOTE — Assessment & Plan Note (Signed)
Afib on eliquis.

## 2022-07-06 NOTE — Assessment & Plan Note (Signed)
Blood pressure as outlined.  Continue Tenormin and amlodipine.  On losartan '100mg'$  q day. Follow pressures. No evidence of volume overload today.  Follow metabolic panel.

## 2022-07-06 NOTE — Assessment & Plan Note (Signed)
Continue Lipitor.  Low cholesterol diet and exercise.  Follow lipid panel liver function test.

## 2022-07-06 NOTE — Assessment & Plan Note (Signed)
On amiodarone and atenolol.  Continue Eliquis.  Stable.

## 2022-07-06 NOTE — Assessment & Plan Note (Signed)
Status post angioplasty.  On Eliquis.  Continue risk factor modification.  Continue statin.

## 2022-07-06 NOTE — Assessment & Plan Note (Signed)
Low-carb diet and exercise.  Follow met b and A1c.

## 2022-07-06 NOTE — Assessment & Plan Note (Signed)
Discussed diabetes.  Low carb diet and exercise.  Discussed treatment options.  Start mounjaro.  Discussed possible side effects of medication.  Follow met b and A1c.

## 2022-07-06 NOTE — Assessment & Plan Note (Signed)
Being followed by neurology.  Status post gamma knife treatment.  No pain currently.  Seeing Dr. Felecia Shelling.  Currently on Lamictal and amitriptyline.

## 2022-07-14 ENCOUNTER — Telehealth: Payer: Self-pay

## 2022-07-14 ENCOUNTER — Other Ambulatory Visit: Payer: Self-pay

## 2022-07-14 ENCOUNTER — Telehealth: Payer: Self-pay | Admitting: Internal Medicine

## 2022-07-14 ENCOUNTER — Ambulatory Visit
Admission: RE | Admit: 2022-07-14 | Discharge: 2022-07-14 | Disposition: A | Discharge status: Home / Self Care | Payer: PPO | Source: Ambulatory Visit | Attending: Internal Medicine | Admitting: Internal Medicine

## 2022-07-14 DIAGNOSIS — Z1231 Encounter for screening mammogram for malignant neoplasm of breast: Secondary | ICD-10-CM | POA: Diagnosis not present

## 2022-07-14 NOTE — Telephone Encounter (Signed)
Patient called and wanted to know about her Ozempic.

## 2022-07-14 NOTE — Telephone Encounter (Signed)
NOTE SENT TO DR Nicki Reaper

## 2022-07-15 ENCOUNTER — Other Ambulatory Visit: Payer: Self-pay

## 2022-07-15 MED ORDER — TIRZEPATIDE 2.5 MG/0.5ML ~~LOC~~ SOAJ: 2.5000 mg | SUBCUTANEOUS | 1 refills | Status: DC

## 2022-07-15 NOTE — Telephone Encounter (Signed)
In reviewing medication list, it appears rx for ozempic was sent in.  Please clarify with pharmacy.  If not, then ok for mounjaro 2.'5mg'$  .

## 2022-07-15 NOTE — Telephone Encounter (Signed)
Spoke w/ pharmacist yesterday - no script was on file. Rx sent for [facility]

## 2022-07-28 ENCOUNTER — Other Ambulatory Visit: Payer: Self-pay

## 2022-07-28 MED ORDER — TIRZEPATIDE 2.5 MG/0.5ML ~~LOC~~ SOAJ
2.5000 mg | SUBCUTANEOUS | 1 refills | Status: DC
  Filled 2022-07-28 – 2022-08-01 (×2): qty 2, 28d supply, fill #0

## 2022-07-29 ENCOUNTER — Other Ambulatory Visit: Payer: Self-pay

## 2022-07-29 ENCOUNTER — Telehealth: Payer: Self-pay | Admitting: Internal Medicine

## 2022-07-29 NOTE — Telephone Encounter (Signed)
Per pt needs PA -  Pt advised PA initiated, waiting for response from [employer]

## 2022-07-29 NOTE — Telephone Encounter (Signed)
Pt would like to be called in regards to her mounjaro

## 2022-08-01 ENCOUNTER — Other Ambulatory Visit: Payer: Self-pay

## 2022-08-01 NOTE — Telephone Encounter (Signed)
Pt called stating her insurance is denying her medication because they need a reason to why she is on the medication. Pt would like a call back

## 2022-08-04 ENCOUNTER — Telehealth: Payer: Self-pay | Admitting: Cardiovascular Disease

## 2022-08-04 NOTE — Telephone Encounter (Signed)
PT ADVISED WAITING ON FAX FORM FOR APPEAL

## 2022-08-04 NOTE — Telephone Encounter (Signed)
Pt called to report she's been experiencing heaviness in both legs x 2 weeks. She denies pain, swelling, or any other symptoms but states her legs just feels tired. She reports symptoms resolves with rest. Pt stated she overall doesn't feel bad but was seen on 3/6 and informed to contact office if she experience the above symptoms.   Pt also reported on 11/25 BP 108/67 HR 88 and today 121/68 HR 84  Will forward to MD for recommendations.

## 2022-08-04 NOTE — Telephone Encounter (Signed)
Pt c/o medication issue:  1. Name of Medication:  atenolol (TENORMIN) 25 MG tablet  2. How are you currently taking this medication (dosage and times per day)?   3. Are you having a reaction (difficulty breathing--STAT)?   4. What is your medication issue?   Patient states Dr. Rockey Situ advised her that if she develops heaviness in her legs to call the office and let him know because Atenolol may need to be adjusted. She states she has had some heaviness recently and she denies swelling.

## 2022-08-05 DIAGNOSIS — Z7901 Long term (current) use of anticoagulants: Secondary | ICD-10-CM | POA: Diagnosis not present

## 2022-08-05 DIAGNOSIS — Z6841 Body Mass Index (BMI) 40.0 and over, adult: Secondary | ICD-10-CM | POA: Diagnosis not present

## 2022-08-05 DIAGNOSIS — Z86010 Personal history of colonic polyps: Secondary | ICD-10-CM | POA: Diagnosis not present

## 2022-08-05 NOTE — Telephone Encounter (Signed)
Patient was calling back for update. Please advise  

## 2022-08-06 NOTE — Telephone Encounter (Signed)
Pt updated with MD's recommendations and verbalized understanding. Pt stated she actually feels better and symptoms could have been related to walking around a lot and getting ready for christmas.   Minna Merritts, MD  [facility] [phone] minutes ago (12:04 PM)    Blood pressure and heart rate looks in a good range We need the atenolol to control leg heart rate as she is in atrial fibrillation I cannot think of any medication changes that are needed to help with leg strength Would make sure doing regular walking program for leg strengthening Thx TGollan

## 2022-08-12 ENCOUNTER — Telehealth: Payer: Self-pay | Admitting: Cardiovascular Disease

## 2022-08-12 NOTE — Telephone Encounter (Signed)
   Pre-operative Risk Assessment    Patient Name: Kristin James  DOB: 05/06/1956 MRN: [id number]      Request for Surgical Clearance    Procedure:  Colonoscopy  Date of Surgery:  Clearance 11/07/22                                 Surgeon:  not listed Surgeon's Group or Practice Name:  [facility] Phone number:  [phone] Fax number:  [phone]   Type of Clearance Requested:  Pharmacy Eliquis    Type of Anesthesia:  Not Indicated   Additional requests/questions:    Signed, Maxwell Caul   08/12/2022, 10:05 AM

## 2022-08-13 NOTE — Telephone Encounter (Signed)
Patient with diagnosis of afib on Eliquis for anticoagulation.    Procedure: colonoscopy Date of procedure: 11/07/22  CHA2DS2-VASc Score = 5  This indicates a 7.2% annual risk of stroke. The patient's score is based upon: CHF History: 0 HTN History: 1 Diabetes History: 1 Stroke History: 0 Vascular Disease History: 1 Age Score: 1 Gender Score: 1   CrCl 39m/min using adjusted body weight Platelet count 217K  Per office protocol, patient can hold Eliquis for 1-2 days prior to procedure.    **This guidance is not considered finalized until pre-operative APP has relayed final recommendations.**

## 2022-08-13 NOTE — Telephone Encounter (Signed)
Patient on Eliquis for PAF. Will route to pharmacy team for input.    CHA2DS2-VASc Score = 4   This indicates a 4.8% annual risk of stroke. The patient's score is based upon: CHF History: 0 HTN History: 1 Diabetes History: 1 Stroke History: 0 Vascular Disease History: 0 Age Score: 1 Gender Score: 1     Platelet count: 05/08/2022: Platelets 217.0   Creatinine clearance: 48 mL/min (adjusted for weight)  05/08/2022: Hemoglobin 15.2 06/25/2022: Creatinine, Ser 1.15     Kristin Dubonnet, NP  08/13/22  9:56 AM

## 2022-08-14 ENCOUNTER — Telehealth: Payer: Self-pay | Admitting: Cardiovascular Disease

## 2022-08-14 NOTE — Telephone Encounter (Signed)
Pt rescheduled to see Dr. Rockey Situ on 10/13/22 for preop clearance

## 2022-08-14 NOTE — Telephone Encounter (Signed)
Patient appointment rescheduled to 10/13/22, 9:40 am.

## 2022-08-14 NOTE — Telephone Encounter (Signed)
1st attempt to reach patient to schedule sooner appointment. Lvm

## 2022-08-14 NOTE — Telephone Encounter (Signed)
Primary Cardiologist:Timothy Rockey Situ, MD  Chart reviewed as part of pre-operative protocol coverage. Because of Kristin James's past medical history and time since last visit, he/she will require a follow-up visit in order to better assess preoperative cardiovascular risk.  Pre-op covering staff: - Patient has an appointment with Dr. Rockey Situ on 11/05/22. Her procedure is scheduled for 11/07/22. Could we please attempt to get her appointment moved up, otherwise she should be made aware that clearance will be provided on 3/6 and she should notify us if she is having cardiac symptoms that may interfere with providing clearance prior to that appointment.  - Please contact requesting surgeon's office via preferred method (i.e, phone, fax) to inform them of need for appointment prior to surgery.  Guidance regarding anticoagulation hold is attached below.   [name], NP-C  08/14/2022, 7:46 AM [address] Office [phone] Fax [phone]

## 2022-08-19 ENCOUNTER — Telehealth: Payer: Self-pay | Admitting: Internal Medicine

## 2022-08-19 NOTE — Telephone Encounter (Signed)
Pt would like to be called in regards to her medication

## 2022-08-20 ENCOUNTER — Other Ambulatory Visit: Payer: Self-pay

## 2022-08-20 DIAGNOSIS — I480 Paroxysmal atrial fibrillation: Secondary | ICD-10-CM

## 2022-08-20 MED ORDER — TIRZEPATIDE 2.5 MG/0.5ML ~~LOC~~ SOAJ: 2.5000 mg | SUBCUTANEOUS | 1 refills | Status: DC

## 2022-08-20 MED ORDER — APIXABAN 5 MG PO TABS: 5.0000 mg | ORAL_TABLET | Freq: Two times a day (BID) | ORAL | 1 refills | Status: DC

## 2022-08-20 NOTE — Telephone Encounter (Signed)
Prescription refill request for Eliquis received. Indication:Afib Last office visit:9/23 Scr:1.1 Age: 66 Weight:92.6 kg  Prescription refilled

## 2022-08-20 NOTE — Telephone Encounter (Signed)
Pharmacy needing a script for [facility]. Please call the patient once you have sent it to  the pharmacy.

## 2022-08-20 NOTE — Telephone Encounter (Signed)
Sent Pt advised

## 2022-08-26 DIAGNOSIS — I10 Essential (primary) hypertension: Secondary | ICD-10-CM | POA: Diagnosis not present

## 2022-08-26 DIAGNOSIS — E118 Type 2 diabetes mellitus with unspecified complications: Secondary | ICD-10-CM | POA: Diagnosis not present

## 2022-08-26 DIAGNOSIS — N1832 Chronic kidney disease, stage 3b: Secondary | ICD-10-CM | POA: Diagnosis not present

## 2022-09-09 ENCOUNTER — Telehealth: Payer: Self-pay | Admitting: Internal Medicine

## 2022-09-09 NOTE — Telephone Encounter (Signed)
Prescription Request  09/09/2022  Is this a "Controlled Substance" medicine? No  LOV: 06/25/2022  What is the name of the medication or equipment? tirzepatide [facility]) 2.5 MG/0.5ML Pen  Pt also would like to increase her dosage. She would like a call back when this its done '@336'$ [phone]  Have you contacted your pharmacy to request a refill? Yes   Which pharmacy would you like this sent to?   [id number] - [place], [place] - [address] [place] [address] Phone: [phone] Fax: [phone]      Patient notified that their request is being sent to the clinical staff for review and that they should receive a response within 2 business days.   Please advise at Mobile [phone] (mobile)

## 2022-09-09 NOTE — Telephone Encounter (Signed)
Ok to send in [place] '5mg'$ 

## 2022-09-10 ENCOUNTER — Other Ambulatory Visit: Payer: Self-pay

## 2022-09-10 MED ORDER — TIRZEPATIDE 5 MG/0.5ML ~~LOC~~ SOAJ: 5.0000 mg | SUBCUTANEOUS | 2 refills | Status: DC

## 2022-09-10 NOTE — Telephone Encounter (Signed)
Pt advised.

## 2022-09-10 NOTE — Telephone Encounter (Signed)
sent 

## 2022-09-15 ENCOUNTER — Other Ambulatory Visit (HOSPITAL_COMMUNITY): Payer: Self-pay

## 2022-09-20 ENCOUNTER — Other Ambulatory Visit: Payer: Self-pay | Admitting: Internal Medicine

## 2022-10-12 NOTE — Progress Notes (Unsigned)
Cardiology Office Note  Date:  10/13/2022   ID:  Kristin James, DOB 06-May-1956, MRN [id number]  PCP:  [name], MD   Chief Complaint  Patient presents with   6 month follow up     Cardiac clearance for a colonoscopy. Medications reviewed by the patient verbally.     HPI:  Kristin James is a pleasant 67 year old woman with history of  paroxysmal atrial fibrillation, no sx HTN,  chronic back pain,  spinal stenosis,  obesity,   Trigeminal neuralgia hospital in September 2015 for acute left lower extremity arterial ischemia secondary to thrombus.  She presents for routine followup of her atrial fibrillation  Last seen by myself in clinic September 2023 Reports that she is scheduled for colonoscopy November 07, 2022 Heart rate elevated on today's visit, remains in atrial fibrillation  On weight loss medication, Mounjaro Weight 203 now 196 Active at baseline, denies significant chest pain or shortness of breath, no leg swelling no PND orthopnea  Asymptomatic from her atrial fibrillation  Prior surgery left knee Balance problem, back issue  Previous discussions concerning her atrial fibrillation, declined intervention Blood pressure running low at home typically less than [id number] systolic  EKG personally reviewed by myself on todays visit Atrial fibrillation ventricular rate 115 bpm no significant ST-T wave changes  Other past medical history reviewed  tumor pressing on her fifth cranial nerve Had gamma knife treatment in the past followed with MRI yearly   Trigeminal neuralgia x 6 months. She has seen several specialists, Including prednisone, Neurontin, trileptal among others  Severe pain left side of face, shocking pain,   back surgery March 2016, lumbar region Prior office visit, EKG documented atrial fibrillation. Started on amiodarone, diltiazem, beta blocker and she converted to normal sinus rhythm. Atenolol dose decreased secondary to bradycardia   admitted to the  hospital on May 21 2014 with acute pain in her left leg. She had no distal pulses. Her creatinine was 1.99 which improved to 1.0 with fluids. She underwent angiogram of the left lower extremity with thrombolysis with TPA of the left pill, tibial peroneal trunk, peroneal arteries and distally, thrombectomy of the vessel with restoration of flow. she does not have any sleep apnea, does have occasional snoring   Stress test was done in the hospital for chest pain. This showed no ischemia. Ejection fraction 67% EKG in the hospital showed normal sinus rhythm with rate 74 beats per minute. EKG dated 05/21/2014    PMH:   has a past medical history of Afib ([place]), Cellulitis of right arm, Embolus to lower extremity ([place]), Hiatal hernia, History of echocardiogram, History of stress test, Hypercholesteremia, Hyperglycemia, Hypertension, Left Cerebellopontine Angle Meningioma ([place]), Osteoarthritis, PAF (paroxysmal atrial fibrillation) ([place]), Proteinuria, Shingles, Spinal stenosis at L4-L5 level, Thrombophlebitis of posterior tibial vein ([place]) (06/01/2014), and Trigeminal neuralgia (08/2016).  PSH:    Past Surgical History:  Procedure Laterality Date   AORTOGRAM     BACK SURGERY  11/13/2014   CARDIOVERSION N/A 07/19/2020   Procedure: CARDIOVERSION;  Surgeon: Minna Merritts, MD;  Location: [facility] ORS;  Service: Cardiovascular;  Laterality: N/A;   CHOLECYSTECTOMY     COLONOSCOPY WITH PROPOFOL N/A 08/15/2016   Procedure: COLONOSCOPY WITH PROPOFOL;  Surgeon: Lollie Sails, MD;  Location: [facility] ENDOSCOPY;  Service: Endoscopy;  Laterality: N/A;   KNEE ARTHROSCOPY     LOWER EXTREMITY ANGIOGRAM      Current Outpatient Medications  Medication Sig Dispense Refill   acetaminophen (TYLENOL) 650 MG CR tablet Take  by mouth.     amitriptyline (ELAVIL) 25 MG tablet TAKE 1 TABLET(25 MG) BY MOUTH AT BEDTIME. 90 tablet 4   amLODipine (NORVASC) 5 MG tablet TAKE 1 TABLET(5 MG) BY MOUTH TWICE DAILY 180 tablet 1    apixaban (ELIQUIS) 5 MG TABS tablet Take 1 tablet (5 mg total) by mouth 2 (two) times daily. 180 tablet 1   atenolol (TENORMIN) 25 MG tablet TAKE 1 TABLET BY MOUTH EVERY DAY 30 tablet 6   atorvastatin (LIPITOR) 40 MG tablet TAKE 1 TABLET BY MOUTH EVERY DAY 90 tablet 2   furosemide (LASIX) 20 MG tablet Take 1 tablet (20 mg total) by mouth daily as needed. Sparingly for leg swelling 45 tablet 3   lamoTRIgine (LAMICTAL) 100 MG tablet TAKE 1 TABLET(100 MG) BY MOUTH TWICE DAILY 180 tablet 3   losartan (COZAAR) 100 MG tablet TAKE 1 TABLET(100 MG) BY MOUTH DAILY 90 tablet 1   tirzepatide (MOUNJARO) 5 MG/0.5ML Pen Inject 5 mg into the skin once a week. 6 mL 2   No current facility-administered medications for this visit.    Allergies:   Patient has no known allergies.   Social History:  The patient  reports that she has never smoked. She has never used smokeless tobacco. She reports current alcohol use. She reports that she does not use drugs.   Family History:   family history includes Breast cancer in her paternal grandmother; Dementia in her mother; Hypertension in her father and mother; Stroke in her father.    Review of Systems: Review of Systems  Constitutional: Negative.   HENT: Negative.    Respiratory: Negative.    Cardiovascular: Negative.   Gastrointestinal: Negative.   Musculoskeletal: Negative.   Neurological: Negative.        Balance issues  Psychiatric/Behavioral: Negative.    All other systems reviewed and are negative.   PHYSICAL EXAM: VS:  BP 112/70 (BP Location: Left Arm, Patient Position: Sitting, Cuff Size: Normal)   Pulse (!) 115   Ht 5' (1.524 m)   Wt 196 lb (88.9 kg)   SpO2 99%   BMI 38.28 kg/m  , BMI Body mass index is 38.28 kg/m. Constitutional:  oriented to person, place, and time. No distress.  HENT:  Head: Grossly normal Eyes:  no discharge. No scleral icterus.  Neck: No JVD, no carotid bruits  Cardiovascular: Regular rate and rhythm, no murmurs  appreciated Pulmonary/Chest: Clear to auscultation bilaterally, no wheezes or rails Abdominal: Soft.  no distension.  no tenderness.  Musculoskeletal: Normal range of motion Neurological:  normal muscle tone. Coordination normal. No atrophy Skin: Skin warm and dry Psychiatric: normal affect, pleasant  Recent Labs: 11/29/2021: TSH 3.52 05/08/2022: Hemoglobin 15.2; Platelets 217.0 06/25/2022: ALT 16; BUN 32; Creatinine, Ser 1.15; Potassium 4.3; Sodium 140   Lipid Panel Lab Results  Component Value Date   CHOL 168 05/08/2022   HDL 52.40 05/08/2022   LDLCALC 95 05/08/2022   TRIG 106.0 05/08/2022     Wt Readings from Last 3 Encounters:  10/13/22 196 lb (88.9 kg)  06/25/22 204 lb 3.2 oz (92.6 kg)  05/07/22 201 lb (91.2 kg)     ASSESSMENT AND PLAN:  Preop cardiovascular evaluation for colonoscopy Acceptable risk, medication changes as below, stop Mounjaro 1 week before colonoscopy, stop Eliquis 2 days prior to procedure, restart Eliquis when cleared by GI  Essential hypertension Recommend she stop amlodipine start diltiazem ER 120 daily, decrease losartan down to 50 daily, continue atenolol 25 daily  Hypercholesterolemia Encouraged  her to stay on her Lipitor Losing weight with Mounjaro  Morbid obesity ([place]) Recommend she continue her exercise program, calorie restriction Now on Mounjaro, weight trending down slowly  Chronic low back pain Stable symptoms  Atrial fibrillation with RVR ([place]) - Plan: EKG 12-Lead On Eliquis, beta-blocker, will add diltiazem ER 120 daily stop amlodipine given elevated rate on todays visit Recommend she stop Eliquis 2 days prior to colonoscopy, restart when cleared by GI  Trigeminal neuralgia of left side of face Followed on annual basis with MRI  Chronic renal insufficiency Creatinine 1.3, stable Would avoid NSAIDs   Total encounter time more than 30 minutes  Greater than 50% was spent in counseling and coordination of care with the  patient     No orders of the defined types were placed in this encounter.    Signed, Esmond Plants, M.D., Ph.D. 10/13/2022  [place], [place]

## 2022-10-13 ENCOUNTER — Ambulatory Visit: Payer: PPO | Attending: Cardiovascular Disease | Admitting: Cardiovascular Disease

## 2022-10-13 ENCOUNTER — Encounter: Payer: Self-pay | Admitting: Cardiovascular Disease

## 2022-10-13 VITALS — BP 112/70 | HR 115 | Ht 60.0 in | Wt 196.0 lb

## 2022-10-13 DIAGNOSIS — I10 Essential (primary) hypertension: Secondary | ICD-10-CM

## 2022-10-13 DIAGNOSIS — N183 Chronic kidney disease, stage 3 unspecified: Secondary | ICD-10-CM

## 2022-10-13 DIAGNOSIS — I480 Paroxysmal atrial fibrillation: Secondary | ICD-10-CM

## 2022-10-13 DIAGNOSIS — E7800 Pure hypercholesterolemia, unspecified: Secondary | ICD-10-CM | POA: Diagnosis not present

## 2022-10-13 DIAGNOSIS — I739 Peripheral vascular disease, unspecified: Secondary | ICD-10-CM

## 2022-10-13 DIAGNOSIS — E782 Mixed hyperlipidemia: Secondary | ICD-10-CM

## 2022-10-13 NOTE — Patient Instructions (Addendum)
Medication Instructions:  Please stop the amlodipine Start diltiazem ER 120 mg daily  Cut the losartan down to 50 mg daily (down from 100 mg)  Monitor heart rate and blood pressure  STOP tirzepatide (MOUNJARO) 5 MG/0.5ML Pen 1 week prior to procedure STOP apixaban (ELIQUIS) 5 MG TABS tablet 2 days prior to colonoscopy  If you need a refill on your cardiac medications before your next appointment, please call your pharmacy.   Lab work: No new labs needed  Testing/Procedures: No new testing needed  Follow-Up: At [facility], you and your health needs are our priority.  As part of our continuing mission to provide you with exceptional heart care, we have created designated Provider Care Teams.  These Care Teams include your primary Cardiologist (physician) and Advanced Practice Providers (APPs -  Physician Assistants and Nurse Practitioners) who all work together to provide you with the care you need, when you need it.  You will need a follow up appointment in 12 months  Providers on your designated Care Team:   Murray Hodgkins, NP Christell Faith, PA-C Cadence Kathlen Mody, [place]  COVID-19 Vaccine Information can be found at: ShippingScam.co.uk For questions related to vaccine distribution or appointments, please email [email or web address] or call [phone].

## 2022-10-14 ENCOUNTER — Telehealth: Payer: Self-pay | Admitting: Cardiovascular Disease

## 2022-10-14 DIAGNOSIS — S01111A Laceration without foreign body of right eyelid and periocular area, initial encounter: Secondary | ICD-10-CM | POA: Diagnosis not present

## 2022-10-14 MED ORDER — DILTIAZEM HCL ER COATED BEADS 120 MG PO CP24: 120.0000 mg | ORAL_CAPSULE | Freq: Every day | ORAL | 3 refills | Status: DC

## 2022-10-14 NOTE — Telephone Encounter (Signed)
I called and spoke with the patient. She was seen in the office on 10/13/22.  She was advised by Dr. Rockey Situ to: Medication Instructions:  Please stop the amlodipine Start diltiazem ER 120 mg daily   Cut the losartan down to 50 mg daily (down from 100 mg)   Monitor heart rate and blood pressure   STOP tirzepatide (MOUNJARO) 5 MG/0.5ML Pen 1 week prior to procedure STOP apixaban (ELIQUIS) 5 MG TABS tablet 2 days prior to colonoscopy  Per the patient, she has stopped amlodipine, but she advised her diltiazem RX was not sent to the pharmacy. I did verify her RX for Diltiazem did not go in yesterday and was not changed on her medication list. She is aware that her RX has been sent in for Diltiazem ER 120 mg once daily. I have advised her that I have noted on the RX to the pharmacy to stop amlodipine.   The patient also advised she needed a refill on amiodarone.  I advised the patient that Dr. Rockey Situ stopped her amiodarone at her office visit with him on 11/04/21. Per the patient, her bottle does have STOP written on it for the amiodarone.  She confirmed she was in a-fib at her visit yesterday. I have advised the patient that the medication is not holding her in sinus rhythm, so it was a failed drug.  She will stop the amiodarone at this time.  Will send to Dr. Rockey Situ as an [place].   The patient voices understanding is agreeable.

## 2022-10-14 NOTE — Telephone Encounter (Signed)
Pt c/o medication issue:  1. Name of Medication: start  diltiazem er 120 mg daily Amiodarone 200 mg  2. How are you currently taking this medication (dosage and times per day)? 1 tablet daily amiodarone, has not started diltiazem because she does not have it  3. Are you having a reaction (difficulty breathing--STAT)? no  4. What is your medication issue? Patient states she has not received the diltiazem yet and her amiodarone needs a refill, but they are not listed on her current medication list.

## 2022-10-27 ENCOUNTER — Telehealth: Payer: Self-pay | Admitting: Cardiovascular Disease

## 2022-10-27 NOTE — Telephone Encounter (Signed)
Pt c/o medication issue:  1. Name of Medication:  diltiazem (CARDIZEM CD) 120 MG 24 hr capsule   2. How are you currently taking this medication (dosage and times per day)? As prescribed   3. Are you having a reaction (difficulty breathing--STAT)? Yes   4. What is your medication issue? Medication is causing patient dizziness and hoarseness that she reports has been gradually getting worse. Please advise.

## 2022-10-31 NOTE — Telephone Encounter (Signed)
Called and spoke with patient. Reviewed provider recommendations and she reports she has been feeling better since Monday so she prefers not to change for now. Advised if she should have a return of symptoms to give [place] a call back and we can review options again. She verbalized understanding with no further questions at this time.     Minna Merritts, MD  Sent: Fri October 31, 2022  8:15 AM  To: [facility] Triage         Message  If she does not tolerate diltiazem we can go back to what she was doing before  She can stop diltiazem  Go back on amlodipine 5 twice daily, stay on losartan 50  Increase atenolol up to 25 twice a day  Would monitor blood pressure and heart rate, would call us if heart rate runs fast  Thx  TG

## 2022-11-05 ENCOUNTER — Ambulatory Visit: Payer: PPO | Admitting: Cardiovascular Disease

## 2022-11-06 ENCOUNTER — Encounter: Payer: Self-pay | Admitting: *Deleted

## 2022-11-07 ENCOUNTER — Encounter
Admission: RE | Disposition: A | Discharge status: Home / Self Care | Payer: Self-pay | Source: Home / Self Care | Attending: Gastroenterology

## 2022-11-07 ENCOUNTER — Ambulatory Visit: Payer: PPO | Admitting: Anesthesiology

## 2022-11-07 ENCOUNTER — Ambulatory Visit
Admission: RE | Admit: 2022-11-07 | Discharge: 2022-11-07 | Disposition: A | Discharge status: Home / Self Care | Payer: PPO | Attending: Gastroenterology | Admitting: Gastroenterology

## 2022-11-07 DIAGNOSIS — K5730 Diverticulosis of large intestine without perforation or abscess without bleeding: Secondary | ICD-10-CM | POA: Diagnosis not present

## 2022-11-07 DIAGNOSIS — I480 Paroxysmal atrial fibrillation: Secondary | ICD-10-CM | POA: Insufficient documentation

## 2022-11-07 DIAGNOSIS — Z7901 Long term (current) use of anticoagulants: Secondary | ICD-10-CM | POA: Diagnosis not present

## 2022-11-07 DIAGNOSIS — Z1211 Encounter for screening for malignant neoplasm of colon: Secondary | ICD-10-CM | POA: Insufficient documentation

## 2022-11-07 DIAGNOSIS — M1990 Unspecified osteoarthritis, unspecified site: Secondary | ICD-10-CM | POA: Insufficient documentation

## 2022-11-07 DIAGNOSIS — Z79899 Other long term (current) drug therapy: Secondary | ICD-10-CM | POA: Diagnosis not present

## 2022-11-07 DIAGNOSIS — E7800 Pure hypercholesterolemia, unspecified: Secondary | ICD-10-CM | POA: Insufficient documentation

## 2022-11-07 DIAGNOSIS — Z87891 Personal history of nicotine dependence: Secondary | ICD-10-CM | POA: Insufficient documentation

## 2022-11-07 DIAGNOSIS — E1151 Type 2 diabetes mellitus with diabetic peripheral angiopathy without gangrene: Secondary | ICD-10-CM | POA: Insufficient documentation

## 2022-11-07 DIAGNOSIS — Z86010 Personal history of colonic polyps: Secondary | ICD-10-CM | POA: Diagnosis not present

## 2022-11-07 DIAGNOSIS — I10 Essential (primary) hypertension: Secondary | ICD-10-CM | POA: Diagnosis not present

## 2022-11-07 HISTORY — PX: COLONOSCOPY WITH PROPOFOL: SHX5780

## 2022-11-07 SURGERY — COLONOSCOPY WITH PROPOFOL
Anesthesia: General

## 2022-11-07 MED ORDER — PHENYLEPHRINE HCL (PRESSORS) 10 MG/ML IV SOLN
INTRAVENOUS | Status: DC | PRN
  Administered 2022-11-07: 120 ug via INTRAVENOUS
  Administered 2022-11-07: 200 ug via INTRAVENOUS

## 2022-11-07 MED ORDER — PROPOFOL 500 MG/50ML IV EMUL
INTRAVENOUS | Status: DC | PRN
  Administered 2022-11-07: 192.739 ug/kg/min via INTRAVENOUS

## 2022-11-07 MED ORDER — PHENYLEPHRINE 80 MCG/ML (10ML) SYRINGE FOR IV PUSH (FOR BLOOD PRESSURE SUPPORT)
PREFILLED_SYRINGE | INTRAVENOUS | Status: AC
  Filled 2022-11-07: qty 10

## 2022-11-07 MED ORDER — PROPOFOL 10 MG/ML IV BOLUS
INTRAVENOUS | Status: DC | PRN
  Administered 2022-11-07: 90 mg via INTRAVENOUS

## 2022-11-07 MED ORDER — LIDOCAINE HCL (CARDIAC) PF 100 MG/5ML IV SOSY
PREFILLED_SYRINGE | INTRAVENOUS | Status: DC | PRN
  Administered 2022-11-07: 100 mg via INTRAVENOUS

## 2022-11-07 MED ORDER — SODIUM CHLORIDE 0.9 % IV SOLN
INTRAVENOUS | Status: DC
  Administered 2022-11-07: 20 mL/h via INTRAVENOUS

## 2022-11-07 NOTE — H&P (Signed)
Outpatient short stay form Pre-procedure 11/07/2022  Lesly Rubenstein, MD  Primary Physician: [name], MD  Reason for visit:  Surveillance  History of present illness:    67 y/o lady with history of obesity, a. Fib on eliquis (last dose 3 days ago), and hypertension here for colonoscopy for history of adenomatous polyp. Last colonoscopy in 2017 was unremarkable.  History of cholecystectomy. No family history of GI malignancies.    Current Facility-Administered Medications:    0.9 %  sodium chloride infusion, , Intravenous, Continuous, Aslynn Brunetti, Hilton Cork, MD, Last Rate: 20 mL/hr at 11/07/22 1017, 20 mL/hr at 11/07/22 1017  Medications Prior to Admission  Medication Sig Dispense Refill Last Dose   acetaminophen (TYLENOL) 650 MG CR tablet Take by mouth.   Past Week   amitriptyline (ELAVIL) 25 MG tablet TAKE 1 TABLET(25 MG) BY MOUTH AT BEDTIME. 90 tablet 4 Past Week   apixaban (ELIQUIS) 5 MG TABS tablet Take 1 tablet (5 mg total) by mouth 2 (two) times daily. 180 tablet 1 Past Week   atenolol (TENORMIN) 25 MG tablet TAKE 1 TABLET BY MOUTH EVERY DAY 30 tablet 6 11/06/2022   atorvastatin (LIPITOR) 40 MG tablet TAKE 1 TABLET BY MOUTH EVERY DAY 90 tablet 2 Past Week   diltiazem (CARDIZEM CD) 120 MG 24 hr capsule Take 1 capsule (120 mg total) by mouth daily. 90 capsule 3 11/06/2022   furosemide (LASIX) 20 MG tablet Take 1 tablet (20 mg total) by mouth daily as needed. Sparingly for leg swelling 45 tablet 3 11/06/2022   lamoTRIgine (LAMICTAL) 100 MG tablet TAKE 1 TABLET(100 MG) BY MOUTH TWICE DAILY 180 tablet 3 Past Week   losartan (COZAAR) 100 MG tablet TAKE 1 TABLET(100 MG) BY MOUTH DAILY 90 tablet 1 11/06/2022   tirzepatide (MOUNJARO) 5 MG/0.5ML Pen Inject 5 mg into the skin once a week. 6 mL 2 Past Week     No Known Allergies   Past Medical History:  Diagnosis Date   Afib ([place])    Cellulitis of right arm    Embolus to lower extremity ([place])    a. 05/2014 s/p thrombolysis of LLE.    Hiatal hernia    History of echocardiogram    a. 07/2020 Echo: EF 55-60%, no rwma, Nl RV size/fxn. Mildly dil LA. Mild MR/MS. Sev mitral annular Ca2+. Mild Ao sclerosis w/o stenosis.   History of stress test    a. 05/2014 lexiscan MV: EF 67%, no ischemia/infarct.   Hypercholesteremia    Hyperglycemia    Hypertension    Left Cerebellopontine Angle Meningioma ([place])    a. 10/2016 s/p Gamma knife.   Osteoarthritis    PAF (paroxysmal atrial fibrillation) ([place])    a. Dx 10/2014->converted on amio. CHA2DS2VASc = 3-4-->Eliquis.   Proteinuria    Shingles    dec 2021   Spinal stenosis at L4-L5 level    Thrombophlebitis of posterior tibial vein ([place]) 06/01/2014   Trigeminal neuralgia 08/2016    Review of systems:  Otherwise negative.    Physical Exam  Gen: Alert, oriented. Appears stated age.  HEENT: PERRLA. Lungs: No respiratory distress CV: RRR Abd: soft, benign, no masses Ext: No edema    Planned procedures: Proceed with colonoscopy. The patient understands the nature of the planned procedure, indications, risks, alternatives and potential complications including but not limited to bleeding, infection, perforation, damage to internal organs and possible oversedation/side effects from anesthesia. The patient agrees and gives consent to proceed.  Please refer to procedure notes for findings,  recommendations and patient disposition/instructions.     Lesly Rubenstein, MD [facility]

## 2022-11-07 NOTE — Anesthesia Postprocedure Evaluation (Signed)
Anesthesia Post Note  Patient: Kristin James  Procedure(s) Performed: COLONOSCOPY WITH PROPOFOL  Patient location during evaluation: Endoscopy Anesthesia Type: General Level of consciousness: awake and alert Pain management: pain level controlled Vital Signs Assessment: post-procedure vital signs reviewed and stable Respiratory status: spontaneous breathing, nonlabored ventilation, respiratory function stable and patient connected to nasal cannula oxygen Cardiovascular status: blood pressure returned to baseline and stable Postop Assessment: no apparent nausea or vomiting Anesthetic complications: no   No notable events documented.   Last Vitals:  Vitals:   11/07/22 1114 11/07/22 1124  BP: 132/63 95/64  Pulse: (!) 106   Resp: 15   Temp: (!) 36.2 C   SpO2: 92%     Last Pain:  Vitals:   11/07/22 1124  TempSrc:   PainSc: 0-No pain                 Arita Miss

## 2022-11-07 NOTE — Transfer of Care (Signed)
Immediate Anesthesia Transfer of Care Note  Patient: Kristin James  Procedure(s) Performed: COLONOSCOPY WITH PROPOFOL  Patient Location: Endoscopy Unit  Anesthesia Type:General  Level of Consciousness: drowsy  Airway & Oxygen Therapy: Patient Spontanous Breathing  Post-op Assessment: Report given to RN and Post -op Vital signs reviewed and stable  Post vital signs: Reviewed and stable  Last Vitals:  Vitals Value Taken Time  BP 132/63 11/07/22 1115  Temp    Pulse 52 11/07/22 1115  Resp 15 11/07/22 1115  SpO2 92 % 11/07/22 1115  Vitals shown include unvalidated device data.  Last Pain:  Vitals:   11/07/22 1004  TempSrc: Temporal  PainSc: 0-No pain         Complications: No notable events documented.

## 2022-11-07 NOTE — Op Note (Signed)
[facility] Gastroenterology Patient Name: Kristin James Procedure Date: 11/07/2022 10:36 AM MRN: [id number] Account #: [id number] Date of Birth: 07-Jan-1956 Admit Type: Outpatient Age: 67 Room: [facility] ENDO ROOM 1 Gender: Female Note Status: Finalized Instrument Name: Jasper Riling [id number] Procedure:             Colonoscopy Indications:           High risk colon cancer surveillance: Personal history                         of colonic polyps Providers:             Andrey Farmer MD, MD Medicines:             Monitored Anesthesia Care Complications:         No immediate complications. Procedure:             Pre-Anesthesia Assessment:                        - Prior to the procedure, a History and Physical was                         performed, and patient medications and allergies were                         reviewed. The patient is competent. The risks and                         benefits of the procedure and the sedation options and                         risks were discussed with the patient. All questions                         were answered and informed consent was obtained.                         Patient identification and proposed procedure were                         verified by the physician, the nurse, the                         anesthesiologist, the anesthetist and the technician                         in the endoscopy suite. Mental Status Examination:                         alert and oriented. Airway Examination: normal                         oropharyngeal airway and neck mobility. Respiratory                         Examination: clear to auscultation. CV Examination:                         normal. Prophylactic Antibiotics: The patient does not  require prophylactic antibiotics. Prior                         Anticoagulants: The patient has taken Eliquis                         (apixaban), last dose was 3 days prior to  procedure.                         ASA Grade Assessment: III - A patient with severe                         systemic disease. After reviewing the risks and                         benefits, the patient was deemed in satisfactory                         condition to undergo the procedure. The anesthesia                         plan was to use monitored anesthesia care (MAC).                         Immediately prior to administration of medications,                         the patient was re-assessed for adequacy to receive                         sedatives. The heart rate, respiratory rate, oxygen                         saturations, blood pressure, adequacy of pulmonary                         ventilation, and response to care were monitored                         throughout the procedure. The physical status of the                         patient was re-assessed after the procedure.                        After obtaining informed consent, the colonoscope was                         passed under direct vision. Throughout the procedure,                         the patient's blood pressure, pulse, and oxygen                         saturations were monitored continuously. The                         Colonoscope was introduced through the anus and  advanced to the the ileocecal valve. The colonoscopy                         was somewhat difficult due to restricted mobility of                         the colon. The patient tolerated the procedure well.                         The quality of the bowel preparation was inadequate.                         The ileocecal valve and the rectum were photographed. Findings:      The perianal and digital rectal examinations were normal.      A moderate amount of semi-liquid stool was found in the sigmoid colon,       in the descending colon, in the transverse colon, in the ascending colon       and in the cecum, making  visualization difficult.      Multiple small-mouthed diverticula were found in the sigmoid colon.      The exam was otherwise without abnormality on direct and retroflexion       views. Impression:            - Preparation of the colon was inadequate.                        - Stool in the sigmoid colon, in the descending colon,                         in the transverse colon, in the ascending colon and in                         the cecum.                        - Diverticulosis in the sigmoid colon.                        - The examination was otherwise normal on direct and                         retroflexion views.                        - No specimens collected. Recommendation:        - Discharge patient to home.                        - Resume previous diet.                        - Continue present medications.                        - Repeat colonoscopy in 6-12 months because the bowel                         preparation was suboptimal.                        -  Return to referring physician as previously                         scheduled. Procedure Code(s):     --- Professional ---                        [id number], Colorectal cancer screening; colonoscopy on                         individual at high risk Diagnosis Code(s):     --- Professional ---                        Z86.010, Personal history of colonic polyps                        K57.30, Diverticulosis of large intestine without                         perforation or abscess without bleeding CPT copyright 2022 American Medical Association. All rights reserved. The codes documented in this report are preliminary and upon coder review may  be revised to meet current compliance requirements. Andrey Farmer MD, MD 11/07/2022 11:15:48 AM Number of Addenda: 0 Note Initiated On: 11/07/2022 10:36 AM Scope Withdrawal Time: 0 hours 6 minutes 14 seconds  Total Procedure Duration: 0 hours 16 minutes 57 seconds  Estimated Blood Loss:   Estimated blood loss: none.      [facility]

## 2022-11-07 NOTE — Interval H&P Note (Signed)
History and Physical Interval Note:  11/07/2022 10:46 AM  Kristin James  has presented today for surgery, with the diagnosis of HX OF ADENOMATOUS POLYP OF COLON.  The various methods of treatment have been discussed with the patient and family. After consideration of risks, benefits and other options for treatment, the patient has consented to  Procedure(s): COLONOSCOPY WITH PROPOFOL (N/A) as a surgical intervention.  The patient's history has been reviewed, patient examined, no change in status, stable for surgery.  I have reviewed the patient's chart and labs.  Questions were answered to the patient's satisfaction.     Lesly Rubenstein  Ok to proceed with colonoscopy

## 2022-11-07 NOTE — Anesthesia Preprocedure Evaluation (Signed)
Anesthesia Evaluation  Patient identified by MRN, date of birth, ID band Patient awake    Reviewed: Allergy & Precautions, NPO status , Patient's Chart, lab work & pertinent test results  History of Anesthesia Complications Negative for: history of anesthetic complications  Airway Mallampati: II  TM Distance: >3 FB Neck ROM: Full    Dental no notable dental hx. (+) Teeth Intact   Pulmonary neg pulmonary ROS, neg sleep apnea, neg COPD, Patient abstained from smoking.Not current smoker   Pulmonary exam normal breath sounds clear to auscultation       Cardiovascular Exercise Tolerance: Good METShypertension, + Peripheral Vascular Disease  (-) CAD and (-) Past MI + dysrhythmias Atrial Fibrillation  Rhythm:Regular Rate:Normal - Systolic murmurs    Neuro/Psych negative neurological ROS  negative psych ROS   GI/Hepatic ,neg GERD  ,,(+)     (-) substance abuse    Endo/Other  diabetes  Stopped mounjaro over 1 week ago. Denies GI symptoms today  Renal/GU CRFRenal disease     Musculoskeletal  (+) Arthritis ,    Abdominal   Peds  Hematology   Anesthesia Other Findings Past Medical History: No date: Afib ([place]) No date: Cellulitis of right arm No date: Embolus to lower extremity ([place])     Comment:  a. 05/2014 s/p thrombolysis of LLE. No date: Hiatal hernia No date: History of echocardiogram     Comment:  a. 07/2020 Echo: EF 55-60%, no rwma, Nl RV size/fxn.               Mildly dil LA. Mild MR/MS. Sev mitral annular Ca2+. Mild               Ao sclerosis w/o stenosis. No date: History of stress test     Comment:  a. 05/2014 lexiscan MV: EF 67%, no ischemia/infarct. No date: Hypercholesteremia No date: Hyperglycemia No date: Hypertension No date: Left Cerebellopontine Angle Meningioma ([place])     Comment:  a. 10/2016 s/p Gamma knife. No date: Osteoarthritis No date: PAF (paroxysmal atrial fibrillation) (HCC)     Comment:   a. Dx 10/2014->converted on amio. CHA2DS2VASc =               3-4-->Eliquis. No date: Proteinuria No date: Shingles     Comment:  dec 2021 No date: Spinal stenosis at L4-L5 level 06/01/2014: Thrombophlebitis of posterior tibial vein ([place]) 08/2016: Trigeminal neuralgia  Reproductive/Obstetrics                             Anesthesia Physical Anesthesia Plan  ASA: 3  Anesthesia Plan: General   Post-op Pain Management: Minimal or no pain anticipated   Induction: Intravenous  PONV Risk Score and Plan: 3 and Propofol infusion, TIVA and Ondansetron  Airway Management Planned: Nasal Cannula  Additional Equipment: None  Intra-op Plan:   Post-operative Plan:   Informed Consent: I have reviewed the patients History and Physical, chart, labs and discussed the procedure including the risks, benefits and alternatives for the proposed anesthesia with the patient or authorized representative who has indicated his/her understanding and acceptance.     Dental advisory given  Plan Discussed with: CRNA and Surgeon  Anesthesia Plan Comments: (Discussed risks of anesthesia with patient, including possibility of difficulty with spontaneous ventilation under anesthesia necessitating airway intervention, PONV, and rare risks such as cardiac or respiratory or neurological events, and allergic reactions. Discussed the role of CRNA in patient's perioperative care. Patient understands.)  Anesthesia Quick Evaluation  

## 2022-11-08 NOTE — Progress Notes (Signed)
Non-identified Voicemail.  No Message Left. 

## 2022-11-10 ENCOUNTER — Encounter: Payer: Self-pay | Admitting: Gastroenterology

## 2022-11-18 ENCOUNTER — Other Ambulatory Visit: Payer: Self-pay | Admitting: Cardiovascular Disease

## 2023-02-16 ENCOUNTER — Other Ambulatory Visit: Payer: Self-pay | Admitting: Cardiovascular Disease

## 2023-02-16 DIAGNOSIS — I480 Paroxysmal atrial fibrillation: Secondary | ICD-10-CM

## 2023-02-16 NOTE — Telephone Encounter (Signed)
Refill request for Eliquis

## 2023-02-16 NOTE — Telephone Encounter (Signed)
Prescription refill request for Eliquis received. Indication:afib Last office visit:2/24 Scr:1.15  10/23 Age: 67 Weight:86.3  kg  Prescription refilled

## 2023-03-03 ENCOUNTER — Telehealth: Payer: Self-pay | Admitting: Internal Medicine

## 2023-03-03 NOTE — Telephone Encounter (Signed)
Given that she is taking 5mg  and is tolerating, ok to change to 7.5mg  dose.

## 2023-03-03 NOTE — Telephone Encounter (Signed)
Patient would like to increase her mounjaro. Was on the 5 mg and then off of medication for about 6 weeks due to med out of stock. Patient has restarted the medication and finished her last box. Requesting to increase to the 7.5 mg dose. Tolerating 5 mg dose well, no side effects. Has lost about 20 pounds.

## 2023-03-03 NOTE — Telephone Encounter (Signed)
Prescription Request  03/03/2023  LOV: 06/25/2022  What is the name of the medication or equipment? tirzepatide [facility]) 5 MG/0.5ML Pen  Patient is ready to increase    Have you contacted your pharmacy to request a refill? No   Which pharmacy would you like this sent to?  [id number] - [place], [place] - [address] AT [facility] [address] [facility] [address] [place] [address] Phone: [phone] Fax: [phone]   Patient notified that their request is being sent to the clinical staff for review and that they should receive a response within 2 business days.   Please advise at Mobile [phone] (mobile)

## 2023-03-04 MED ORDER — TIRZEPATIDE 7.5 MG/0.5ML ~~LOC~~ SOAJ: 7.5000 mg | SUBCUTANEOUS | 1 refills | Status: DC

## 2023-03-04 NOTE — Telephone Encounter (Signed)
Patient returned call from Rita Ohara, LPN.  I read Dr. Westley Hummer Scott's message to patient.  I scheduled patient for an appointment with Dr. Lorin Picket on 03/09/2023.

## 2023-03-04 NOTE — Telephone Encounter (Signed)
Noted  

## 2023-03-04 NOTE — Telephone Encounter (Signed)
[name]. I have sent in increased dose of mounjaro for her. When she returns call- needs appt soon with Dr Lorin Picket. Anytime is fine within the next few weeks.

## 2023-03-09 ENCOUNTER — Telehealth: Payer: Self-pay

## 2023-03-09 ENCOUNTER — Ambulatory Visit (INDEPENDENT_AMBULATORY_CARE_PROVIDER_SITE_OTHER): Payer: PPO | Admitting: Internal Medicine

## 2023-03-09 ENCOUNTER — Encounter: Payer: Self-pay | Admitting: Internal Medicine

## 2023-03-09 VITALS — BP 150/88 | HR 70 | Temp 97.9°F | Resp 16 | Ht 60.0 in | Wt 180.6 lb

## 2023-03-09 DIAGNOSIS — N1832 Chronic kidney disease, stage 3b: Secondary | ICD-10-CM

## 2023-03-09 DIAGNOSIS — E7800 Pure hypercholesterolemia, unspecified: Secondary | ICD-10-CM

## 2023-03-09 DIAGNOSIS — I10 Essential (primary) hypertension: Secondary | ICD-10-CM

## 2023-03-09 DIAGNOSIS — E1165 Type 2 diabetes mellitus with hyperglycemia: Secondary | ICD-10-CM

## 2023-03-09 DIAGNOSIS — D582 Other hemoglobinopathies: Secondary | ICD-10-CM

## 2023-03-09 DIAGNOSIS — D6869 Other thrombophilia: Secondary | ICD-10-CM | POA: Diagnosis not present

## 2023-03-09 DIAGNOSIS — D329 Benign neoplasm of meninges, unspecified: Secondary | ICD-10-CM

## 2023-03-09 DIAGNOSIS — N898 Other specified noninflammatory disorders of vagina: Secondary | ICD-10-CM

## 2023-03-09 DIAGNOSIS — I739 Peripheral vascular disease, unspecified: Secondary | ICD-10-CM

## 2023-03-09 DIAGNOSIS — I480 Paroxysmal atrial fibrillation: Secondary | ICD-10-CM | POA: Diagnosis not present

## 2023-03-09 LAB — CBC WITH DIFFERENTIAL/PLATELET
Basophils Absolute: 0.1 10*3/uL (ref 0.0–0.1)
Basophils Relative: 1 % (ref 0.0–3.0)
Eosinophils Absolute: 0 10*3/uL (ref 0.0–0.7)
Eosinophils Relative: 0 % (ref 0.0–5.0)
HCT: 48.4 % — ABNORMAL HIGH (ref 36.0–46.0)
Hemoglobin: 15.8 g/dL — ABNORMAL HIGH (ref 12.0–15.0)
Lymphocytes Relative: 25 % (ref 12.0–46.0)
Lymphs Abs: 2 10*3/uL (ref 0.7–4.0)
MCHC: 32.6 g/dL (ref 30.0–36.0)
MCV: 91.2 fl (ref 78.0–100.0)
Monocytes Absolute: 0.6 10*3/uL (ref 0.1–1.0)
Monocytes Relative: 8 % (ref 3.0–12.0)
Neutro Abs: 5.2 10*3/uL (ref 1.4–7.7)
Neutrophils Relative %: 66 % (ref 43.0–77.0)
Platelets: 212 10*3/uL (ref 150.0–400.0)
RBC: 5.31 Mil/uL — ABNORMAL HIGH (ref 3.87–5.11)
RDW: 14.1 % (ref 11.5–15.5)
WBC: 7.8 10*3/uL (ref 4.0–10.5)

## 2023-03-09 LAB — LIPID PANEL
Cholesterol: 121 mg/dL (ref 0–200)
HDL: 45.8 mg/dL (ref >39.00)
LDL Cholesterol: 62 mg/dL (ref 0–99)
NonHDL: 75.45
Total CHOL/HDL Ratio: 3
Triglycerides: 67 mg/dL (ref 0.0–149.0)
VLDL: 13.4 mg/dL (ref 0.0–40.0)

## 2023-03-09 LAB — MICROALBUMIN / CREATININE URINE RATIO
Creatinine,U: 142.5 mg/dL
Microalb Creat Ratio: 96.2 mg/g — ABNORMAL HIGH (ref 0.0–30.0)
Microalb, Ur: 137 mg/dL — ABNORMAL HIGH (ref 0.0–1.9)

## 2023-03-09 LAB — BASIC METABOLIC PANEL
BUN: 22 mg/dL (ref 6–23)
CO2: 27 mEq/L (ref 19–32)
Calcium: 9.4 mg/dL (ref 8.4–10.5)
Chloride: 104 mEq/L (ref 96–112)
Creatinine, Ser: 1.18 mg/dL (ref 0.40–1.20)
GFR: 47.74 mL/min — ABNORMAL LOW (ref >60.00)
Glucose, Bld: 85 mg/dL (ref 70–99)
Potassium: 4 mEq/L (ref 3.5–5.1)
Sodium: 142 mEq/L (ref 135–145)

## 2023-03-09 LAB — HEPATIC FUNCTION PANEL
ALT: 25 U/L (ref 0–35)
AST: 29 U/L (ref 0–37)
Albumin: 4.3 g/dL (ref 3.5–5.2)
Alkaline Phosphatase: 113 U/L (ref 39–117)
Bilirubin, Direct: 0.3 mg/dL (ref 0.0–0.3)
Total Bilirubin: 1.5 mg/dL — ABNORMAL HIGH (ref 0.2–1.2)
Total Protein: 6.8 g/dL (ref 6.0–8.3)

## 2023-03-09 LAB — TSH: TSH: 2.95 u[IU]/mL (ref 0.35–5.50)

## 2023-03-09 LAB — HEMOGLOBIN A1C: Hgb A1c MFr Bld: 6 % (ref 4.6–6.5)

## 2023-03-09 NOTE — Patient Instructions (Signed)
Increase atenolol to 50 mg per day.

## 2023-03-09 NOTE — Progress Notes (Signed)
Subjective:    Patient ID: Kristin James, female    DOB: August 28, 1956, 67 y.o.   MRN: [id number]  Patient here for  Chief Complaint  Patient presents with   Medical Management of Chronic Issues    HPI Here to follow up regarding hypercholesterolemia, afib, CKD and hypertension. Was started on mounjaro previous visit.  Tolerating.  Dose recently increased to 7.5mg .  Saw Dr Mariah Milling 10/13/22 - f/u afib.  Was in afib.  HR 115. Amlodipine was stopped and she was started on diltiazem 120mg .  Losartan was decreased to 50mg  and continued on atenolol.  Colonoscopy 11/07/22 - poor prep.  Recommended f/u in 6 months. She reports today, she is doing relatively well.  No chest pain.  Breathing stable.  Reports no notice of increased heart rate.  Blood pressure has been elevated recently - 150s/80s.  Some vaginal irritation and itching.     Past Medical History:  Diagnosis Date   Afib (HCC)    Cellulitis of right arm    Embolus to lower extremity (HCC)    a. 05/2014 s/p thrombolysis of LLE.   Hiatal hernia    History of echocardiogram    a. 07/2020 Echo: EF 55-60%, no rwma, Nl RV size/fxn. Mildly dil LA. Mild MR/MS. Sev mitral annular Ca2+. Mild Ao sclerosis w/o stenosis.   History of stress test    a. 05/2014 lexiscan MV: EF 67%, no ischemia/infarct.   Hypercholesteremia    Hyperglycemia    Hypertension    Left Cerebellopontine Angle Meningioma (HCC)    a. 10/2016 s/p Gamma knife.   Osteoarthritis    PAF (paroxysmal atrial fibrillation) (HCC)    a. Dx 10/2014->converted on amio. CHA2DS2VASc = 3-4-->Eliquis.   Proteinuria    Shingles    dec 2021   Spinal stenosis at L4-L5 level    Thrombophlebitis of posterior tibial vein (HCC) 06/01/2014   Trigeminal neuralgia 08/2016   Past Surgical History:  Procedure Laterality Date   AORTOGRAM     BACK SURGERY  11/13/2014   CARDIOVERSION N/A 07/19/2020   Procedure: CARDIOVERSION;  Surgeon: Antonieta Iba, MD;  Location: [facility] ORS;  Service:  Cardiovascular;  Laterality: N/A;   CHOLECYSTECTOMY     COLONOSCOPY WITH PROPOFOL N/A 08/15/2016   Procedure: COLONOSCOPY WITH PROPOFOL;  Surgeon: Christena Deem, MD;  Location: [facility] ENDOSCOPY;  Service: Endoscopy;  Laterality: N/A;   COLONOSCOPY WITH PROPOFOL N/A 11/07/2022   Procedure: COLONOSCOPY WITH PROPOFOL;  Surgeon: Regis Bill, MD;  Location: [facility] ENDOSCOPY;  Service: Endoscopy;  Laterality: N/A;   KNEE ARTHROSCOPY     LOWER EXTREMITY ANGIOGRAM     Family History  Problem Relation Age of Onset   Hypertension Mother    Dementia Mother    Stroke Father        3 strokes   Hypertension Father    Breast cancer Paternal Grandmother    Colon cancer Neg Hx    Social History   Socioeconomic History   Marital status: Married    Spouse name: Not on file   Number of children: 2   Years of education: Not on file   Highest education level: Not on file  Occupational History   Not on file  Tobacco Use   Smoking status: Never   Smokeless tobacco: Never  Vaping Use   Vaping status: Never Used  Substance and Sexual Activity   Alcohol use: Yes    Alcohol/week: 0.0 standard drinks of alcohol    Comment: occ. wine  Drug use: No   Sexual activity: Not on file  Other Topics Concern   Not on file  Social History Narrative   Right handed    Caffeine use: coffee (1/2 cup per day)   Social Determinants of Health   Financial Resource Strain: Not on file  Food Insecurity: Not on file  Transportation Needs: Not on file  Physical Activity: Not on file  Stress: Not on file  Social Connections: Not on file     Review of Systems  Constitutional:  Negative for appetite change and unexpected weight change.  HENT:  Negative for congestion and sinus pressure.   Respiratory:  Negative for cough, chest tightness and shortness of breath.   Cardiovascular:  Negative for chest pain and palpitations.  Gastrointestinal:  Negative for abdominal pain, diarrhea, nausea and vomiting.   Genitourinary:  Negative for difficulty urinating and dysuria.       Vaginal irritation.   Musculoskeletal:  Negative for joint swelling and myalgias.  Skin:  Negative for color change and rash.  Neurological:  Negative for dizziness and headaches.  Psychiatric/Behavioral:  Negative for agitation and dysphoric mood.        Objective:     BP (!) 150/88   Pulse 70   Temp 97.9 F (36.6 C)   Resp 16   Ht 5' (1.524 m)   Wt 180 lb 9.6 oz (81.9 kg)   SpO2 98%   BMI 35.27 kg/m  Wt Readings from Last 3 Encounters:  03/09/23 180 lb 9.6 oz (81.9 kg)  11/07/22 190 lb 3.2 oz (86.3 kg)  10/13/22 196 lb (88.9 kg)    Physical Exam Vitals reviewed.  Constitutional:      General: She is not in acute distress.    Appearance: Normal appearance.  HENT:     Head: Normocephalic and atraumatic.     Right Ear: External ear normal.     Left Ear: External ear normal.  Eyes:     General: No scleral icterus.       Right eye: No discharge.        Left eye: No discharge.     Conjunctiva/sclera: Conjunctivae normal.  Neck:     Thyroid: No thyromegaly.  Cardiovascular:     Comments: Irregularly irregular. Ventricular rate 110-120.  Pulmonary:     Effort: No respiratory distress.     Breath sounds: Normal breath sounds. No wheezing.  Abdominal:     General: Bowel sounds are normal.     Palpations: Abdomen is soft.     Tenderness: There is no abdominal tenderness.  Genitourinary:    Comments: Vaginal irritation - labia. Vaginal vault without lesions. Could not appreciate any adnexal masses or tenderness.   Musculoskeletal:        General: No swelling or tenderness.     Cervical back: Neck supple. No tenderness.  Lymphadenopathy:     Cervical: No cervical adenopathy.  Skin:    Findings: No erythema or rash.  Neurological:     Mental Status: She is alert.  Psychiatric:        Mood and Affect: Mood normal.        Behavior: Behavior normal.      Outpatient Encounter Medications as  of 03/09/2023  Medication Sig   nystatin cream (MYCOSTATIN) Apply 1 Application topically 2 (two) times daily.   acetaminophen (TYLENOL) 650 MG CR tablet Take by mouth.   amitriptyline (ELAVIL) 25 MG tablet TAKE 1 TABLET(25 MG) BY MOUTH AT BEDTIME.  atorvastatin (LIPITOR) 40 MG tablet TAKE 1 TABLET BY MOUTH EVERY DAY   diltiazem (CARDIZEM CD) 120 MG 24 hr capsule Take 1 capsule (120 mg total) by mouth daily.   ELIQUIS 5 MG TABS tablet TAKE 1 TABLET(5 MG) BY MOUTH TWICE DAILY   furosemide (LASIX) 20 MG tablet Take 1 tablet (20 mg total) by mouth daily as needed. Sparingly for leg swelling   lamoTRIgine (LAMICTAL) 100 MG tablet TAKE 1 TABLET(100 MG) BY MOUTH TWICE DAILY   losartan (COZAAR) 100 MG tablet TAKE 1 TABLET(100 MG) BY MOUTH DAILY   tirzepatide (MOUNJARO) 7.5 MG/0.5ML Pen Inject 7.5 mg into the skin once a week.   [DISCONTINUED] atenolol (TENORMIN) 25 MG tablet TAKE 1 TABLET BY MOUTH EVERY DAY   No facility-administered encounter medications on file as of 03/09/2023.     Lab Results  Component Value Date   WBC 7.8 03/09/2023   HGB 15.8 (H) 03/09/2023   HCT 48.4 (H) 03/09/2023   PLT 212.0 03/09/2023   GLUCOSE 85 03/09/2023   CHOL 121 03/09/2023   TRIG 67.0 03/09/2023   HDL 45.80 03/09/2023   LDLCALC 62 03/09/2023   ALT 25 03/09/2023   AST 29 03/09/2023   NA 142 03/09/2023   K 4.0 03/09/2023   CL 104 03/09/2023   CREATININE 1.18 03/09/2023   BUN 22 03/09/2023   CO2 27 03/09/2023   TSH 2.95 03/09/2023   INR 1.5 (H) 06/03/2020   HGBA1C 6.0 03/09/2023   MICROALBUR 137.0 (H) 03/09/2023    No results found.     Assessment & Plan:  Hypercholesterolemia Assessment & Plan: Continue Lipitor.  Low cholesterol diet and exercise.  Follow lipid panel liver function test.  Orders: -     CBC with Differential/Platelet -     Hepatic function panel -     Lipid panel -     TSH  Primary hypertension Assessment & Plan: Saw Dr Mariah Milling 10/2022.  Amlodipine was changed to diltiazem  - due to afib with RVR.  Continues on losartan - on 50mg  q day now (dose changed last cardiology visit).  Blood pressure elevated today.  If afib with RVR.  Will increase atenolol to 50mg  q day with hopes of better rate control and blood pressure control.  Have her spot check pressures.  Send in readings.  May need to increase losartan back up to 100mg  q day.   Orders: -     Basic metabolic panel  Type 2 diabetes mellitus with hyperglycemia, without long-term current use of insulin (HCC) Assessment & Plan: Discussed diabetes.  Low carb diet and exercise.  On mounjaro and tolerating.  Follow met b and A1c.   Orders: -     Hemoglobin A1c -     Microalbumin / creatinine urine ratio  Stage 3b chronic kidney disease (HCC) Assessment & Plan: Off meloxicam now.  Seeing nephrology.  Continue to avoid antiinflammatories. Stay hydrated.  Continue losartan.    Paroxysmal atrial fibrillation (HCC) Assessment & Plan: EKG today - afib with RVR.  Initial ventricular rate139.  Recheck EKG ventricular rate 120 and 110.  Denies any chest pain or sob.  Discussed with cardiology.  Will increase atenolol to 50mg  q day.  Have her spot check pressure and pulse.  Refer to EP given persistent afib as outlined.  Check electrolytes, tsh and cbc.  Continue eliquis.   Orders: -     EKG 12-Lead -     Ambulatory referral to Cardiac Electrophysiology  Acquired thrombophilia (HCC)  Assessment & Plan: Afib on eliquis.   Elevated hemoglobin (HCC) Assessment & Plan: Hgb 15.8/HCT 48.  Stable.  Follow cbc.    Meningioma [facility]) Assessment & Plan: Being followed by neurology.  Status post gamma knife treatment.  No pain currently.  Seeing Dr. Epimenio Foot.  Currently on Lamictal and amitriptyline.   PAD (peripheral artery disease) (HCC) Assessment & Plan: Status post angioplasty.  On Eliquis.  Continue risk factor modification.  Continue statin.   Vaginal itching Assessment & Plan: Vaginal irritation as outlined.   Nystatin cream as directed.  Follow.    Other orders -     Nystatin; Apply 1 Application topically 2 (two) times daily.  Dispense: 30 g; Refill: 0     Dale [place], MD

## 2023-03-09 NOTE — Telephone Encounter (Signed)
Patient states she saw Dr. Dale Steelton earlier today and she was supposed to call in a cream for her to the pharmacy.  Patient states she has not heard from her pharmacy and does not know the name of the cream.  I do not see a cream on her medication list.  I was unable to reach Rita Ohara, LPN, at the time of the call.  I let patient know that I will send her a message.

## 2023-03-10 MED ORDER — NYSTATIN 100000 UNIT/GM EX CREA: 1.0000 | TOPICAL_CREAM | Freq: Two times a day (BID) | CUTANEOUS | 0 refills | Status: DC

## 2023-03-10 NOTE — Telephone Encounter (Signed)
Rx sent in for nystatin cream 

## 2023-03-11 ENCOUNTER — Telehealth: Payer: Self-pay

## 2023-03-11 NOTE — Telephone Encounter (Signed)
Noted.  Lab ordered.

## 2023-03-11 NOTE — Telephone Encounter (Signed)
Pt called back and I read the message to her and she is scheduled for a lab on 8/21

## 2023-03-11 NOTE — Telephone Encounter (Signed)
-----   Message from Dale [place], MD sent at 03/10/2023  5:32 AM EDT ----- Notify - kidney function is relatively stable from last check.  (Improved some from checks prior).  Bilirubin level slightly increased, but remainder of liver panel wnl.  This can be a normal variant.  Will follow. Cholesterol levels look good. Overall sugar control improved.  Thyroid test wnl.  Recheck liver panel in 4-6 weeks (dx hyperbilirubinemia)

## 2023-03-11 NOTE — Addendum Note (Signed)
Addended by: Rita Ohara D on: 03/11/2023 09:39 AM   Modules accepted: Orders

## 2023-03-11 NOTE — Telephone Encounter (Signed)
Pt aware.

## 2023-03-12 ENCOUNTER — Telehealth: Payer: Self-pay | Admitting: Internal Medicine

## 2023-03-12 NOTE — Telephone Encounter (Signed)
Pt would like to be called regarding her atenolol medication

## 2023-03-13 ENCOUNTER — Encounter: Payer: Self-pay | Admitting: Cardiovascular Disease

## 2023-03-13 ENCOUNTER — Telehealth: Payer: Self-pay | Admitting: Cardiovascular Disease

## 2023-03-13 DIAGNOSIS — I480 Paroxysmal atrial fibrillation: Secondary | ICD-10-CM

## 2023-03-13 MED ORDER — ATENOLOL 50 MG PO TABS: 50.0000 mg | ORAL_TABLET | Freq: Two times a day (BID) | ORAL | 3 refills | Status: DC

## 2023-03-13 MED ORDER — ATENOLOL 50 MG PO TABS: 50.0000 mg | ORAL_TABLET | Freq: Every day | ORAL | 0 refills | Status: DC

## 2023-03-13 NOTE — Telephone Encounter (Signed)
I reviewed the patient's chart.  Per a 10/27/22 phone note, the patient was not tolerating diltiazem.  She was advised at that time:  If she does not tolerate diltiazem we can go back to what she was doing before  She can stop diltiazem  Go back on amlodipine 5 twice daily, stay on losartan 50  Increase atenolol up to 25 twice a day  Would monitor blood pressure and heart rate, would call us if heart rate runs fast  Thx  TG    I spoke with the patient today.  She states that she was has only been taking atenolol 25 mg BID until her visit with Dr. Lorin Picket o 03/09/23. She advised that Dr. Lorin Picket reached out to Dr. Mariah Milling and they agreed to increase the patient's Atenolol to 50 mg once daily. The dose increase appears to be for hypertension.   I advised the patient I will confirm with Dr. Mariah Milling if he spoke with Dr. Lorin Picket and if he his ok to refill atenolol at 50 mg once daily.   The patient is aware we will send in the RX to her pharmacy if ok'ed by Dr. Mariah Milling.   She voices understanding and is agreeable.

## 2023-03-13 NOTE — Telephone Encounter (Signed)
Called patient and informed her of the following recommendations from Dr. Mariah Milling.  Would recommend increasing atenolol up to 50 mg twice a day not once a day On recent visit with primary care, was in atrial fibrillation with elevated rate Also can we clarify if she is taking diltiazem (reported having dizziness and hoarseness) If she is not on diltiazem, we can take it off the list Need her to monitor her heart rate at home and call us with numbers Needs follow-up with the EP Thx TG        Patient states that she is still taking Diltiazem. Patient verbalizes understanding of recommendations. Prescription sent to preferred pharmacy. Order placed for EP referral.

## 2023-03-13 NOTE — Telephone Encounter (Signed)
[name]

## 2023-03-13 NOTE — Telephone Encounter (Signed)
Pt c/o medication issue:  1. Name of Medication: atenolol (TENORMIN) 25 MG tablet    2. How are you currently taking this medication (dosage and times per day)?   TAKE 1 TABLET BY MOUTH EVERY DAY    3. Are you having a reaction (difficulty breathing--STAT)? No  4. What is your medication issue?Patient stated that the prescription was changed from 25MG  a day to 50MG  a day. Patient is requesting that we send a new prescription refill for 50MG  with a 90 day supply to the [facility] in [place] on [address].

## 2023-03-13 NOTE — Telephone Encounter (Signed)
Confirmed doing ok. Rx sent in

## 2023-03-13 NOTE — Telephone Encounter (Signed)
Ok to send in rx for atenolol 50mg  q day.  Please confirm she is doing ok.

## 2023-03-13 NOTE — Telephone Encounter (Signed)
Patient was calling in wanting me to send in new rx for atenolol since we increased to 50mg  at her recent appt. Dr Mariah Milling prescribed. Just wanted to confirm you are ok for me to send in new dose of atenolol or see cardiology first?

## 2023-03-15 ENCOUNTER — Encounter: Payer: Self-pay | Admitting: Internal Medicine

## 2023-03-16 ENCOUNTER — Telehealth: Payer: Self-pay | Admitting: Cardiovascular Disease

## 2023-03-16 NOTE — Telephone Encounter (Signed)
Left voicemail, need to schedule appt with EP

## 2023-03-16 NOTE — Assessment & Plan Note (Signed)
Discussed diabetes.  Low carb diet and exercise.  On mounjaro and tolerating.  Follow met b and A1c.

## 2023-03-16 NOTE — Assessment & Plan Note (Signed)
Being followed by neurology.  Status post gamma knife treatment.  No pain currently.  Seeing Dr. Sater.  Currently on Lamictal and amitriptyline. 

## 2023-03-16 NOTE — Assessment & Plan Note (Signed)
 Afib on eliquis.

## 2023-03-16 NOTE — Assessment & Plan Note (Signed)
Status post angioplasty.  On Eliquis.  Continue risk factor modification.  Continue statin. 

## 2023-03-16 NOTE — Assessment & Plan Note (Signed)
Continue Lipitor.  Low-cholesterol diet and exercise.  Follow lipid panel liver function test.

## 2023-03-16 NOTE — Assessment & Plan Note (Signed)
Hgb 15.8/HCT 48.  Stable.  Follow cbc.

## 2023-03-16 NOTE — Assessment & Plan Note (Addendum)
EKG today - afib with RVR.  Initial ventricular rate139.  Recheck EKG ventricular rate 120 and 110.  Denies any chest pain or sob.  Discussed with cardiology.  Will increase atenolol to 50mg  q day.  Have her spot check pressure and pulse.  Refer to EP given persistent afib as outlined.  Check electrolytes, tsh and cbc.  Continue eliquis.

## 2023-03-16 NOTE — Assessment & Plan Note (Signed)
Off meloxicam now.  Seeing nephrology.  Continue to avoid antiinflammatories. Stay hydrated.  Continue losartan.

## 2023-03-16 NOTE — Telephone Encounter (Signed)
Error

## 2023-03-16 NOTE — Assessment & Plan Note (Signed)
Saw Dr Mariah Milling 10/2022.  Amlodipine was changed to diltiazem - due to afib with RVR.  Continues on losartan - on 50mg  q day now (dose changed last cardiology visit).  Blood pressure elevated today.  If afib with RVR.  Will increase atenolol to 50mg  q day with hopes of better rate control and blood pressure control.  Have her spot check pressures.  Send in readings.  May need to increase losartan back up to 100mg  q day.

## 2023-03-16 NOTE — Assessment & Plan Note (Signed)
Vaginal irritation as outlined.  Nystatin cream as directed.  Follow.

## 2023-03-19 ENCOUNTER — Ambulatory Visit: Payer: PPO | Admitting: Neurology

## 2023-04-06 ENCOUNTER — Encounter: Payer: Self-pay | Admitting: Pharmacist

## 2023-04-06 NOTE — Progress Notes (Signed)
Pharmacy Quality Measure Review  This patient is appearing on a report for being at risk of failing the adherence measure for diabetes medications this calendar year.   Medication: Kristin James  Last fill date: 7/5 for 69 day supply  Insurance report was not up to date, no action needed at this time.   Catie Eppie Gibson, PharmD, BCACP, CPP Clinical Pharmacist [facility] [phone]

## 2023-04-08 ENCOUNTER — Ambulatory Visit: Payer: PPO | Admitting: Neurology

## 2023-04-08 ENCOUNTER — Ambulatory Visit: Payer: PPO | Admitting: Cardiology

## 2023-04-08 ENCOUNTER — Encounter: Payer: Self-pay | Admitting: Neurology

## 2023-04-08 VITALS — BP 188/108 | HR 95 | Ht 60.0 in | Wt 176.5 lb

## 2023-04-08 DIAGNOSIS — D329 Benign neoplasm of meninges, unspecified: Secondary | ICD-10-CM | POA: Diagnosis not present

## 2023-04-08 DIAGNOSIS — G500 Trigeminal neuralgia: Secondary | ICD-10-CM

## 2023-04-08 DIAGNOSIS — Z923 Personal history of irradiation: Secondary | ICD-10-CM

## 2023-04-08 DIAGNOSIS — R260 Ataxic gait: Secondary | ICD-10-CM | POA: Diagnosis not present

## 2023-04-08 MED ORDER — AMITRIPTYLINE HCL 25 MG PO TABS: ORAL_TABLET | ORAL | 4 refills | Status: DC

## 2023-04-08 MED ORDER — LAMOTRIGINE 100 MG PO TABS: ORAL_TABLET | ORAL | 3 refills | Status: DC

## 2023-04-08 NOTE — Progress Notes (Addendum)
[facility]  PATIENT: Kristin James DOB: 1955/09/05  REFERRING DOCTOR OR PCP:  D[facility] [place] SOURCE: Patient, notes from Dr. Lorin Picket,  _________________________________   HISTORICAL  CHIEF COMPLAINT:  Chief Complaint  Patient presents with   Follow-up    Pt in room 10, husband in room. Here for Meningioma follow up. Pt reports being stable, concerned about balance, reports feeling unsteady. Pt has A-fib blood pressure is elevated, had upcoming cardiology appointment.    HISTORY OF PRESENT ILLNESS:  Kristin James is a 67 y.o. woman with a meningioma causing left trigeminal neuralgia.  Update 04/08/2023: She feels her facial pain is doing very well on the current regimen.    She has a left cerebellopontine angle meningioma that has been treated with gamma knife in the past.  It is stable on MRI.    Last MRI 01/23/2021 showed no worsening  She feels her pain is very well controlled on her current medications.  She is currently on lamotrigine 100 mg po bid and amitriptyline 25 mg nighty.  She had bven on oxcarbazepine when pain was worse as well.    She had gamma knofe 10/2016.   Recent MRIs show no further growth.      Balance is poor ans she uses a cane if on a he is walking more but still notes issues with her balance and will use a cane for longer distance.   No facial numbness.    She has some falls, but none recently.  She tries to walk 30-40 minutes a day.  She gets 10,000 steps most days.  We discussed that her balance issues could be a combination of the meningioma, radiation and medication side effect  She had shingles in January 2022.  The distribution was midthoracic going from the back to under the breasts on the left.  She still has some allodynia and numbness but not really pain.     Left cerebellopontine angle meningioma and resection She began to experience a dull ache in the left jaw region in mid 2017. Initially, she felt her symptoms were due to a  dental issue and she had a tooth removed and a repeat root canal.  A few months later she began to experience short shock like severe pain in the left jaw.  She would get frequent zaps of severe pain into the left cheek.  When pain was more severe, she couldn't talk.   Brushing her teeth would trigger the most painful sensations.   She was initially placed on gabapentin 100 mg several times a day by her PCP and then Tegretol was added. The Tegretol was switched to Trileptal for tolerability.   MRI of the brain showed that she had a meningioma that involved the facial nerve region.        She continued to have pain and was referred to neurosurgery at [facility] for evaluation of gamma knife procedures.   March 2018, she underwent Gamma knife stereotactic radiosurgery by Dr. Tempie Donning and Dr. Johny Drilling at [facility] neurosurgery and radiation oncology at a dose of 20 Gy was delivered to the outlined tumor divided into 4 fractions.   She felt pain improved some initially but did not resolve.  Pain worsened a few months later despite increases in medication and she was referred to me.  I placed her on lamotrigine and titrate up to 100 mg p.o. twice daily. She is not a candidate for repeat radiation or surgery.     Imaging:  MRI of the brain 10/01/2016 performed at [facility] shows a large left cerebellopontine angle meningioma extending into the left internal auditory canal. There is mass effect on the pons and left vertebral artery. The tumor crosses the expected path of the left cranial nerves V, VI, VII and VIII.   MRI of the brain 11/17/2016 showing the same mass and mentioning redemonstration of encephalomalacia in the left parieto-occipital region and lacunar infarction in the right cerebellum and thalamus.  MRI 03/23/2019 [facility]) 1.  Redemonstrated extra-axial dural based mass along the left cerebellar pontine angle with extension into the left internal auditory canal, left Meckel's cave, and Pars  Nervosa of the left jugular foramen which is similar to possibly slightly decreased in size from exams dated 07/09/2018 and 07/08/2017. 2.  No significant change in surrounding adjacent mass effect, particularly on the middle cerebellar peduncle and medulla/pons.   MRI 01/20/2021 showed no changes.  REVIEW OF SYSTEMS: Constitutional: No fevers, chills, sweats, or change in appetite Eyes: No visual changes, double vision, eye pain Ear, nose and throat: No hearing loss, ear pain, nasal congestion, sore throat Cardiovascular: No chest pain, palpitations Respiratory:  No shortness of breath at rest or with exertion.   No wheezes GastrointestinaI: No nausea, vomiting, diarrhea, abdominal pain, fecal incontinence Genitourinary:  No dysuria, urinary retention or frequency.  No nocturia. Musculoskeletal:  No neck pain, back pain Integumentary: No rash, pruritus, skin lesions Neurological: as above Psychiatric: No depression at this time.  No anxiety Endocrine: No palpitations, diaphoresis, change in appetite, change in weigh or increased thirst Hematologic/Lymphatic:  No anemia, purpura, petechiae. Allergic/Immunologic: No itchy/runny eyes, nasal congestion, recent allergic reactions, rashes  ALLERGIES: No Known Allergies  HOME MEDICATIONS:  Current Outpatient Medications:    acetaminophen (TYLENOL) 650 MG CR tablet, Take by mouth., Disp: , Rfl:    atenolol (TENORMIN) 50 MG tablet, Take 1 tablet (50 mg total) by mouth 2 (two) times daily., Disp: 180 tablet, Rfl: 3   atorvastatin (LIPITOR) 40 MG tablet, TAKE 1 TABLET BY MOUTH EVERY DAY, Disp: 90 tablet, Rfl: 2   diltiazem (CARDIZEM CD) 120 MG 24 hr capsule, Take 1 capsule (120 mg total) by mouth daily., Disp: 90 capsule, Rfl: 3   ELIQUIS 5 MG TABS tablet, TAKE 1 TABLET(5 MG) BY MOUTH TWICE DAILY, Disp: 180 tablet, Rfl: 1   furosemide (LASIX) 20 MG tablet, Take 1 tablet (20 mg total) by mouth daily as needed. Sparingly for leg swelling, Disp: 45  tablet, Rfl: 3   losartan (COZAAR) 100 MG tablet, TAKE 1 TABLET(100 MG) BY MOUTH DAILY, Disp: 90 tablet, Rfl: 1   nystatin cream (MYCOSTATIN), Apply 1 Application topically 2 (two) times daily., Disp: 30 g, Rfl: 0   tirzepatide (MOUNJARO) 7.5 MG/0.5ML Pen, Inject 7.5 mg into the skin once a week., Disp: 6 mL, Rfl: 1   amitriptyline (ELAVIL) 25 MG tablet, TAKE 1 TABLET(25 MG) BY MOUTH AT BEDTIME., Disp: 90 tablet, Rfl: 4   lamoTRIgine (LAMICTAL) 100 MG tablet, TAKE 1/2 TO 1 TABLET(100 MG) BY MOUTH TWICE DAILY, Disp: 180 tablet, Rfl: 3  PAST MEDICAL HISTORY: Past Medical History:  Diagnosis Date   Afib (HCC)    Cellulitis of right arm    Embolus to lower extremity (HCC)    a. 05/2014 s/p thrombolysis of LLE.   Hiatal hernia    History of echocardiogram    a. 07/2020 Echo: EF 55-60%, no rwma, Nl RV size/fxn. Mildly dil LA. Mild MR/MS. Sev mitral annular Ca2+. Mild  Ao sclerosis w/o stenosis.   History of stress test    a. 05/2014 lexiscan MV: EF 67%, no ischemia/infarct.   Hypercholesteremia    Hyperglycemia    Hypertension    Left Cerebellopontine Angle Meningioma (HCC)    a. 10/2016 s/p Gamma knife.   Osteoarthritis    PAF (paroxysmal atrial fibrillation) (HCC)    a. Dx 10/2014->converted on amio. CHA2DS2VASc = 3-4-->Eliquis.   Proteinuria    Shingles    dec 2021   Spinal stenosis at L4-L5 level    Thrombophlebitis of posterior tibial vein (HCC) 06/01/2014   Trigeminal neuralgia 08/2016    PAST SURGICAL HISTORY: Past Surgical History:  Procedure Laterality Date   AORTOGRAM     BACK SURGERY  11/13/2014   CARDIOVERSION N/A 07/19/2020   Procedure: CARDIOVERSION;  Surgeon: Antonieta Iba, MD;  Location: [facility] ORS;  Service: Cardiovascular;  Laterality: N/A;   CHOLECYSTECTOMY     COLONOSCOPY WITH PROPOFOL N/A 08/15/2016   Procedure: COLONOSCOPY WITH PROPOFOL;  Surgeon: Christena Deem, MD;  Location: [facility] ENDOSCOPY;  Service: Endoscopy;  Laterality: N/A;   COLONOSCOPY WITH  PROPOFOL N/A 11/07/2022   Procedure: COLONOSCOPY WITH PROPOFOL;  Surgeon: Regis Bill, MD;  Location: [facility] ENDOSCOPY;  Service: Endoscopy;  Laterality: N/A;   KNEE ARTHROSCOPY     LOWER EXTREMITY ANGIOGRAM      FAMILY HISTORY: Family History  Problem Relation Age of Onset   Hypertension Mother    Dementia Mother    Stroke Father        3 strokes   Hypertension Father    Breast cancer Paternal Grandmother    Colon cancer Neg Hx     SOCIAL HISTORY:  Social History   Socioeconomic History   Marital status: Married    Spouse name: Not on file   Number of children: 2   Years of education: Not on file   Highest education level: Not on file  Occupational History   Not on file  Tobacco Use   Smoking status: Never   Smokeless tobacco: Never  Vaping Use   Vaping status: Never Used  Substance and Sexual Activity   Alcohol use: Yes    Alcohol/week: 0.0 standard drinks of alcohol    Comment: occ. wine   Drug use: No   Sexual activity: Not on file  Other Topics Concern   Not on file  Social History Narrative   Right handed    Caffeine use: coffee (1/2 cup per day)   Social Determinants of Health   Financial Resource Strain: Not on file  Food Insecurity: Not on file  Transportation Needs: Not on file  Physical Activity: Not on file  Stress: Not on file  Social Connections: Not on file  Intimate Partner Violence: Not on file     PHYSICAL EXAM  Vitals:   04/08/23 0803 04/08/23 0808  BP: (!) 188/116 (!) 188/108  Pulse: 88 95  Weight: 176 lb 8 oz (80.1 kg)   Height: 5' (1.524 m)     Body mass index is 34.47 kg/m.   General: The patient is well-developed and well-nourished and in no acute distress  Skin: Sequela of the herpetic eruption around T5 or T6  Neurologic Exam  Mental status: The patient is alert and oriented x 3 at the time of the examination. The patient has apparent normal recent and remote memory, with an apparently normal attention span  and concentration ability.   Speech is normal.  Cranial nerves: Extraocular movements are full.  Facial symmetry is present.  She has good facial sensation to touch bilateral.  Facial strength is normal.  Trapezius and sternocleidomastoid strength is normal. No dysarthria is noted.   No obvious hearing deficits are noted.  Motor:  Muscle bulk is normal.   Tone is normal. Strength is  5 / 5 in all 4 extremities.   Sensory: Sensory testing is intact to touch and vibration sensation in all 4 extremities.   Mild altered sensation around  +/- T4 on left  Coordination: Cerebellar testing reveals good finger-nose-finger and heel-to-shin bilaterally.  Gait and station: Station is normal.   Her gait is mildly wide and tandem is poor.   She walks without a cane in the room.   Romberg is negative.   Reflexes: Deep tendon reflexes are symmetric and normal bilaterally.       DIAGNOSTIC DATA (LABS, IMAGING, TESTING) - I reviewed patient records, labs, notes, testing and imaging myself where available.  Lab Results  Component Value Date   WBC 7.8 03/09/2023   HGB 15.8 (H) 03/09/2023   HCT 48.4 (H) 03/09/2023   MCV 91.2 03/09/2023   PLT 212.0 03/09/2023      Component Value Date/Time   NA 142 03/09/2023 1228   NA 139 07/11/2020 1404   NA 142 05/23/2014 0540   K 4.0 03/09/2023 1228   K 3.7 05/23/2014 0540   CL 104 03/09/2023 1228   CL 114 (H) 05/23/2014 0540   CO2 27 03/09/2023 1228   CO2 21 05/23/2014 0540   GLUCOSE 85 03/09/2023 1228   GLUCOSE 90 05/23/2014 0540   BUN 22 03/09/2023 1228   BUN 28 (H) 07/11/2020 1404   BUN 16 05/23/2014 0540   CREATININE 1.18 03/09/2023 1228   CREATININE 1.07 05/23/2014 0540   CALCIUM 9.4 03/09/2023 1228   CALCIUM 7.4 (L) 05/23/2014 0540   PROT 6.8 03/09/2023 1228   PROT 6.8 03/17/2022 0901   PROT 7.4 05/17/2013 1028   ALBUMIN 4.3 03/09/2023 1228   ALBUMIN 3.6 05/17/2013 1028   AST 29 03/09/2023 1228   AST 28 05/17/2013 1028   ALT 25 03/09/2023  1228   ALT 27 05/17/2013 1028   ALKPHOS 113 03/09/2023 1228   ALKPHOS 103 05/17/2013 1028   BILITOT 1.5 (H) 03/09/2023 1228   BILITOT 0.8 05/17/2013 1028   GFRNONAA 40 (L) 07/11/2020 1404   GFRNONAA 57 (L) 05/23/2014 0540   GFRAA 46 (L) 07/11/2020 1404   GFRAA >60 05/23/2014 0540   Lab Results  Component Value Date   CHOL 121 03/09/2023   HDL 45.80 03/09/2023   LDLCALC 62 03/09/2023   TRIG 67.0 03/09/2023   CHOLHDL 3 03/09/2023   Lab Results  Component Value Date   HGBA1C 6.0 03/09/2023   No results found for: "VITAMINB12" Lab Results  Component Value Date   TSH 2.95 03/09/2023       ASSESSMENT AND PLAN   Meningioma (HCC)  Ataxic gait  Trigeminal neuralgia of left side of face  Status post gamma knife treatment  1.  Continue lamotrigine  but cut to 50 mg po bid to see if balance improves.  If pain increases can go back to 100 mg twice daily,  Continue amitriptyline nightly.    2.  If pain worsens or if gait worsens, additional treatment of the meningioma, either gamma knife or surgery, may be necessary.   Will need MRi within a year, followed by Dr. Tempie Donning (Neurosurgeon) and Dr. Cherly Hensen ([facility]) 3.   Stay active and  exercise as tolerated.   Use cane for safety.   Advised to get grab bars in the shower. 4.   RTC 12 months, sooner if problems  This visit is part of a comprehensive longitudinal care medical relationship regarding the patients primary diagnosis of Meningioma/trigeminal neuralgia and related concerns.     A. Epimenio Foot, MD, [facility] 04/08/2023, 8:55 AM Certified in Neurology, Clinical Neurophysiology, Sleep Medicine, Pain Medicine and Neuroimaging  [facility] [address] [place], [place] [address] [phone]

## 2023-04-08 NOTE — Addendum Note (Signed)
Addended by: Despina Arias A on: 04/08/2023 09:09 AM   Modules accepted: Level of Service

## 2023-04-13 ENCOUNTER — Other Ambulatory Visit: Payer: Self-pay | Admitting: Internal Medicine

## 2023-04-16 ENCOUNTER — Encounter (INDEPENDENT_AMBULATORY_CARE_PROVIDER_SITE_OTHER): Payer: Self-pay

## 2023-04-22 ENCOUNTER — Other Ambulatory Visit (INDEPENDENT_AMBULATORY_CARE_PROVIDER_SITE_OTHER): Payer: PPO

## 2023-04-22 LAB — HEPATIC FUNCTION PANEL
ALT: 30 U/L (ref 0–35)
AST: 29 U/L (ref 0–37)
Albumin: 4.1 g/dL (ref 3.5–5.2)
Alkaline Phosphatase: 127 U/L — ABNORMAL HIGH (ref 39–117)
Bilirubin, Direct: 0.2 mg/dL (ref 0.0–0.3)
Total Bilirubin: 1 mg/dL (ref 0.2–1.2)
Total Protein: 6.4 g/dL (ref 6.0–8.3)

## 2023-05-01 ENCOUNTER — Ambulatory Visit: Payer: PPO | Attending: Cardiology | Admitting: Cardiology

## 2023-05-01 ENCOUNTER — Encounter: Payer: Self-pay | Admitting: Cardiology

## 2023-05-01 VITALS — BP 162/98 | HR 113 | Ht 61.0 in | Wt 174.0 lb

## 2023-05-01 DIAGNOSIS — N183 Chronic kidney disease, stage 3 unspecified: Secondary | ICD-10-CM

## 2023-05-01 DIAGNOSIS — I480 Paroxysmal atrial fibrillation: Secondary | ICD-10-CM | POA: Diagnosis not present

## 2023-05-01 MED ORDER — DILTIAZEM HCL ER COATED BEADS 120 MG PO CP24: 120.0000 mg | ORAL_CAPSULE | Freq: Every day | ORAL | 3 refills | Status: DC

## 2023-05-01 MED ORDER — DILTIAZEM HCL ER COATED BEADS 120 MG PO CP24: 240.0000 mg | ORAL_CAPSULE | Freq: Two times a day (BID) | ORAL | 3 refills | Status: DC

## 2023-05-01 MED ORDER — ATENOLOL 50 MG PO TABS: 50.0000 mg | ORAL_TABLET | Freq: Two times a day (BID) | ORAL | 3 refills | Status: DC

## 2023-05-01 NOTE — Progress Notes (Addendum)
Electrophysiology Office Note:    Date:  05/01/2023   ID:  Kristin James, DOB 1956/03/19, MRN [id number]  [facility] Cardiologist:  Julien Nordmann, MD  [facility] Electrophysiologist:  Lanier Prude, MD   Referring MD: Antonieta Iba, MD   Chief Complaint: Atrial fibrillation  History of Present Illness:    Kristin James is a 67 y.o. femalewho I am seeing today for an evaluation of atrial fibrillation at the request of Dr. Mariah Milling.  The patient was last seen by Dr. Mariah Milling on October 13, 2022.  The patient has a medical history that includes hypertension, obesity, trigeminal neuralgia and atrial fibrillation.  She also has a history of lower extremity arterial ischemia due to thrombus.  She is lost weight recently on Mounjaro.  Her atrial fibrillation diagnosis dates back to March 2016 around the time of a back surgery.  She was started on amiodarone around that time which converted her to normal rhythm.  Her lower extremity thrombus was in September 2015 and raises the suspicion of an earlier diagnosis of atrial fibrillation.  She has had a prior cardioversion in November 2021.  She currently takes Eliquis for stroke prophylaxis, atenolol.Marland Kitchen  --------------------  Today she is doing well.  She cannot tell when she is in or out of rhythm.  She thinks she may have been in atrial fibrillation/flutter since at least 2015.  After her previous cardioversion in 2020 when she felt no different.  She checks her heart rates at home and they are typically in the 80s and 90s.  Her blood pressures are typically in the 140s and 150s at home.        Their past medical, social and family history was reveiwed.   ROS:   Please see the history of present illness.    All other systems reviewed and are negative.  EKGs/Labs/Other Studies Reviewed:    The following studies were reviewed today:  July 11, 2020 echo EF 55-60 RV normal Left atrium mildly dilated Mild MR  March 09, 2023 EKG shows atrial fibrillation/flutter  EKG Interpretation Date/Time:  Friday May 01 2023 08:56:06 EDT Ventricular Rate:  113 PR Interval:    QRS Duration:  84 QT Interval:  390 QTC Calculation: 534 R Axis:   78  Text Interpretation: Atrial flutter with variable A-V block Confirmed by Steffanie Dunn [phone]) on 05/01/2023 8:59:48 AM    Physical Exam:    VS:  BP (!) 162/98   Pulse (!) 113   Ht 5\' 1"  (1.549 m)   Wt 174 lb (78.9 kg)   SpO2 98%   BMI 32.88 kg/m     Wt Readings from Last 3 Encounters:  05/01/23 174 lb (78.9 kg)  04/08/23 176 lb 8 oz (80.1 kg)  03/09/23 180 lb 9.6 oz (81.9 kg)     GEN:  Well nourished, well developed in no acute distress CARDIAC: Irregularly irregular, no murmurs, rubs, gallops RESPIRATORY:  Clear to auscultation without rales, wheezing or rhonchi       ASSESSMENT AND PLAN:    1. Paroxysmal atrial fibrillation (HCC)   2. Stage 3 chronic kidney disease, unspecified whether stage 3a or 3b CKD (HCC)     #Atrial fibrillation/flutter Continue atenolol 50 mg by mouth twice daily  I discussed using a temporary antiarrhythmic drug.  Given her abnormal kidney function, dofetilide is not going to be a good option.  Amiodarone is also not a great option for her given her prior liver troubles.  I discussed the available treatment options for atrial fibrillation including conservative/rate control, antiarrhythmic drugs and catheter ablation.  Given the chronicity of her atrial fibrillation and her being asymptomatic, I have recommended rate control.  She will continue diltiazem.  She is only been taking her atenolol once a day.  I will have her increase the atenolol to 50 mg by mouth twice daily to see if this will help both her blood pressure and heart rate control.  She will continue Eliquis for stroke prophylaxis.  She will follow-up with an APP in 4 weeks to assess her log of blood pressures and heart rates.  If we need further heart  rate control, can increase diltiazem.   #CKD 3A Kidney function has fluctuated over time.  Most recent creatinine 1.18 on July 8.  Given this history, avoid Tikosyn.  #Hypertension above goal today.  Recommend checking blood pressures 1-2 times per week at home and recording the values.  Recommend bringing these recordings to the primary care physician. Continue losartan, atenolol, diltiazem.  Follow-up 4 weeks with APP.      Signed, Rossie Muskrat. Lalla Brothers, MD, [facility] 05/01/2023 9:18 AM    Electrophysiology [facility]

## 2023-05-01 NOTE — Addendum Note (Signed)
Addended by: Darene Lamer T on: 05/01/2023 09:30 AM   Modules accepted: Orders

## 2023-05-01 NOTE — Patient Instructions (Addendum)
Medication Instructions:  Take Diltiazem 120 mg once daily  Take Atenolol 50 mg twice daily   *If you need a refill on your cardiac medications before your next appointment, please call your pharmacy*   Follow-Up: At [facility], you and your health needs are our priority.  As part of our continuing mission to provide you with exceptional heart care, we have created designated Provider Care Teams.  These Care Teams include your primary Cardiologist (physician) and Advanced Practice Providers (APPs -  Physician Assistants and Nurse Practitioners) who all work together to provide you with the care you need, when you need it.  We recommend signing up for the patient portal called "MyChart".  Sign up information is provided on this After Visit Summary.  MyChart is used to connect with patients for Virtual Visits (Telemedicine).  Patients are able to view lab/test results, encounter notes, upcoming appointments, etc.  Non-urgent messages can be sent to your provider as well.   To learn more about what you can do with MyChart, go to [email or web address].    Your next appointment:   4 week(s)  Provider:   Sherie Don, NP

## 2023-05-10 NOTE — Progress Notes (Signed)
Subjective:    Patient ID: Kristin James, female    DOB: 1955/09/14, 67 y.o.   MRN: [id number]  Patient here for  Chief Complaint  Patient presents with   Medication Management    HPI Here to follow up regarding hypercholesterolemia, afib, CKD and hypertension. Saw Dr Mariah Milling 10/13/22 - f/u afib. Was in afib. HR 115. Amlodipine was stopped and she was started on diltiazem 120mg . Losartan was decreased to 50mg  and continued on atenolol. Blood pressure elevated last visit.  Atenolol increased to 50mg .  Dr Mariah Milling sent in rx for 50mg  bid.  Colonoscopy 11/07/22 - poor prep. Recommended f/u in 6 months.  Had f/u with Dr Epimenio Foot 04/08/23 - recommended continuing lamictal, but decrease to 50mg  bid. Continue amitriptyline. F/u MRI in one year.  Saw Dr Lalla Brothers 05/01/23 - recommended atenolol 50mg  bid. Just started atenolol 50mg  bid after seeing Dr Lalla Brothers.  Has not noticed a big change in her blood pressures on her checks.  No chest pain reported.  No abdominal pain or bowel change reported.    Past Medical History:  Diagnosis Date   Afib (HCC)    Cellulitis of right arm    Embolus to lower extremity (HCC)    a. 05/2014 s/p thrombolysis of LLE.   Hiatal hernia    History of echocardiogram    a. 07/2020 Echo: EF 55-60%, no rwma, Nl RV size/fxn. Mildly dil LA. Mild MR/MS. Sev mitral annular Ca2+. Mild Ao sclerosis w/o stenosis.   History of stress test    a. 05/2014 lexiscan MV: EF 67%, no ischemia/infarct.   Hypercholesteremia    Hyperglycemia    Hypertension    Left Cerebellopontine Angle Meningioma (HCC)    a. 10/2016 s/p Gamma knife.   Osteoarthritis    PAF (paroxysmal atrial fibrillation) (HCC)    a. Dx 10/2014->converted on amio. CHA2DS2VASc = 3-4-->Eliquis.   Proteinuria    Shingles    dec 2021   Spinal stenosis at L4-L5 level    Thrombophlebitis of posterior tibial vein (HCC) 06/01/2014   Trigeminal neuralgia 08/2016   Past Surgical History:  Procedure Laterality Date   AORTOGRAM      BACK SURGERY  11/13/2014   CARDIOVERSION N/A 07/19/2020   Procedure: CARDIOVERSION;  Surgeon: Antonieta Iba, MD;  Location: [facility] ORS;  Service: Cardiovascular;  Laterality: N/A;   CHOLECYSTECTOMY     COLONOSCOPY WITH PROPOFOL N/A 08/15/2016   Procedure: COLONOSCOPY WITH PROPOFOL;  Surgeon: Christena Deem, MD;  Location: [facility] ENDOSCOPY;  Service: Endoscopy;  Laterality: N/A;   COLONOSCOPY WITH PROPOFOL N/A 11/07/2022   Procedure: COLONOSCOPY WITH PROPOFOL;  Surgeon: Regis Bill, MD;  Location: [facility] ENDOSCOPY;  Service: Endoscopy;  Laterality: N/A;   KNEE ARTHROSCOPY     LOWER EXTREMITY ANGIOGRAM     Family History  Problem Relation Age of Onset   Hypertension Mother    Dementia Mother    Stroke Father        3 strokes   Hypertension Father    Breast cancer Paternal Grandmother    Colon cancer Neg Hx    Social History   Socioeconomic History   Marital status: Married    Spouse name: Not on file   Number of children: 2   Years of education: Not on file   Highest education level: Not on file  Occupational History   Not on file  Tobacco Use   Smoking status: Never   Smokeless tobacco: Never  Vaping Use   Vaping status:  Never Used  Substance and Sexual Activity   Alcohol use: Yes    Alcohol/week: 0.0 standard drinks of alcohol    Comment: occ. wine   Drug use: No   Sexual activity: Not on file  Other Topics Concern   Not on file  Social History Narrative   Right handed    Caffeine use: coffee (1/2 cup per day)   Social Determinants of Health   Financial Resource Strain: Not on file  Food Insecurity: Not on file  Transportation Needs: Not on file  Physical Activity: Not on file  Stress: Not on file  Social Connections: Not on file     Review of Systems  Constitutional:  Negative for appetite change and unexpected weight change.  HENT:  Negative for congestion and sinus pressure.   Respiratory:  Negative for cough, chest tightness and shortness of  breath.   Cardiovascular:  Negative for chest pain and palpitations.  Gastrointestinal:  Negative for abdominal pain, diarrhea, nausea and vomiting.  Genitourinary:  Negative for difficulty urinating and dysuria.  Musculoskeletal:  Negative for joint swelling and myalgias.  Skin:  Negative for color change and rash.  Neurological:  Negative for dizziness and headaches.  Psychiatric/Behavioral:  Negative for agitation and dysphoric mood.        Objective:     BP (!) 140/78   Pulse 85   Temp 97.6 F (36.4 C) (Oral)   Ht 5' (1.524 m)   Wt 172 lb 12.8 oz (78.4 kg)   SpO2 98%   BMI 33.75 kg/m  Wt Readings from Last 3 Encounters:  05/11/23 172 lb 12.8 oz (78.4 kg)  05/01/23 174 lb (78.9 kg)  04/08/23 176 lb 8 oz (80.1 kg)    Physical Exam Vitals reviewed.  Constitutional:      General: She is not in acute distress.    Appearance: Normal appearance.  HENT:     Head: Normocephalic and atraumatic.     Right Ear: External ear normal.     Left Ear: External ear normal.  Eyes:     General: No scleral icterus.       Right eye: No discharge.        Left eye: No discharge.     Conjunctiva/sclera: Conjunctivae normal.  Neck:     Thyroid: No thyromegaly.  Cardiovascular:     Rate and Rhythm: Normal rate and regular rhythm.  Pulmonary:     Effort: No respiratory distress.     Breath sounds: Normal breath sounds. No wheezing.  Abdominal:     General: Bowel sounds are normal.     Palpations: Abdomen is soft.     Tenderness: There is no abdominal tenderness.  Musculoskeletal:        General: No swelling or tenderness.     Cervical back: Neck supple. No tenderness.  Lymphadenopathy:     Cervical: No cervical adenopathy.  Skin:    Findings: No erythema or rash.  Neurological:     Mental Status: She is alert.  Psychiatric:        Mood and Affect: Mood normal.        Behavior: Behavior normal.      Outpatient Encounter Medications as of 05/11/2023  Medication Sig    acetaminophen (TYLENOL) 650 MG CR tablet Take by mouth.   amitriptyline (ELAVIL) 25 MG tablet TAKE 1 TABLET(25 MG) BY MOUTH AT BEDTIME.   atenolol (TENORMIN) 50 MG tablet Take 1 tablet (50 mg total) by mouth 2 (two) times daily.  atorvastatin (LIPITOR) 40 MG tablet TAKE 1 TABLET BY MOUTH EVERY DAY   diltiazem (CARDIZEM CD) 120 MG 24 hr capsule Take 1 capsule (120 mg total) by mouth daily.   ELIQUIS 5 MG TABS tablet TAKE 1 TABLET(5 MG) BY MOUTH TWICE DAILY   lamoTRIgine (LAMICTAL) 100 MG tablet TAKE 1/2 TO 1 TABLET(100 MG) BY MOUTH TWICE DAILY   losartan (COZAAR) 100 MG tablet TAKE 1 TABLET(100 MG) BY MOUTH DAILY   tirzepatide (MOUNJARO) 7.5 MG/0.5ML Pen Inject 7.5 mg into the skin once a week.   [DISCONTINUED] furosemide (LASIX) 20 MG tablet Take 1 tablet (20 mg total) by mouth daily as needed. Sparingly for leg swelling   [DISCONTINUED] nystatin cream (MYCOSTATIN) Apply 1 Application topically 2 (two) times daily.   No facility-administered encounter medications on file as of 05/11/2023.     Lab Results  Component Value Date   WBC 7.4 05/11/2023   HGB 15.9 (H) 05/11/2023   HCT 48.7 (H) 05/11/2023   PLT 201.0 05/11/2023   GLUCOSE 85 03/09/2023   CHOL 121 03/09/2023   TRIG 67.0 03/09/2023   HDL 45.80 03/09/2023   LDLCALC 62 03/09/2023   ALT 24 05/11/2023   AST 26 05/11/2023   NA 142 03/09/2023   K 4.0 03/09/2023   CL 104 03/09/2023   CREATININE 1.18 03/09/2023   BUN 22 03/09/2023   CO2 27 03/09/2023   TSH 2.95 03/09/2023   INR 1.5 (H) 06/03/2020   HGBA1C 6.0 03/09/2023   MICROALBUR 137.0 (H) 03/09/2023    No results found.     Assessment & Plan:  Primary hypertension Assessment & Plan: Saw Dr Mariah Milling 10/2022.  Amlodipine was changed to diltiazem - due to afib with RVR.  Continues on losartan - on 50mg  q day now (dose changed last cardiology visit).  Blood pressure elevated today.  Just started atenolol 50mg  bid.  Will hold on making changes today.  Have her spot check  pressures.  Send in readings.  May need to increase losartan back up to 100mg  q day.    Elevated hemoglobin (HCC) Assessment & Plan: Recheck cbc to confirm stable.   Orders: -     CBC with Differential/Platelet  Elevated alkaline phosphatase level Assessment & Plan: Previously saw GI.  Work-up including MRI/MRCP unrevealing.  Follow liver function test.  Recent alk phos level 130.  Orders: -     Hepatic function panel  Encounter for immunization -     Flu Vaccine Trivalent High Dose (Fluad)  Type 2 diabetes mellitus with hyperglycemia, without long-term current use of insulin (HCC) Assessment & Plan: Discussed diabetes.  Low carb diet and exercise.  On mounjaro and tolerating.  Follow met b and A1c.    Trigeminal neuralgia of left side of face Assessment & Plan: No pain.  Off gabapentin.  On lamictal. Follow.    Paroxysmal atrial fibrillation [facility]) Assessment & Plan: Saw Dr Mariah Milling 10/13/22 - f/u afib. Was in afib. HR 115. Amlodipine was stopped and she was started on diltiazem 120mg . Losartan was decreased to 50mg  and continued on atenolol. Blood pressure elevated last visit.  Atenolol increased to 50mg .  Dr Mariah Milling sent in rx for 50mg  bid.  Saw Dr Lalla Brothers 05/01/23 - recommended atenolol 50mg  bid. Just started atenolol 50mg  bid after seeing Dr Lalla Brothers.   PAD (peripheral artery disease) (HCC) Assessment & Plan: Status post angioplasty.  On Eliquis.  Continue risk factor modification.  Continue statin.   Meningioma [facility]) Assessment & Plan: Being followed by neurology.  Status post gamma knife treatment.  No pain currently.  Seeing Dr. Epimenio Foot.  Currently on Lamictal and amitriptyline.   Hyperglycemia Assessment & Plan: Low-carb diet and exercise.  Follow met b and A1c.   Hypercholesterolemia Assessment & Plan: Continue Lipitor.  Low cholesterol diet and exercise.  Follow lipid panel liver function test.   Stage 3b chronic kidney disease (HCC) Assessment & Plan: Off  meloxicam.   Seeing nephrology.  Continue to avoid antiinflammatories. Stay hydrated.  Continue losartan.    Acquired thrombophilia (HCC) Assessment & Plan: Afib on eliquis.      Dale Cosmos, MD

## 2023-05-11 ENCOUNTER — Ambulatory Visit (INDEPENDENT_AMBULATORY_CARE_PROVIDER_SITE_OTHER): Payer: PPO | Admitting: Internal Medicine

## 2023-05-11 ENCOUNTER — Encounter: Payer: Self-pay | Admitting: Internal Medicine

## 2023-05-11 VITALS — BP 140/78 | HR 85 | Temp 97.6°F | Ht 60.0 in | Wt 172.8 lb

## 2023-05-11 DIAGNOSIS — Z23 Encounter for immunization: Secondary | ICD-10-CM

## 2023-05-11 DIAGNOSIS — R739 Hyperglycemia, unspecified: Secondary | ICD-10-CM

## 2023-05-11 DIAGNOSIS — I10 Essential (primary) hypertension: Secondary | ICD-10-CM

## 2023-05-11 DIAGNOSIS — G500 Trigeminal neuralgia: Secondary | ICD-10-CM | POA: Diagnosis not present

## 2023-05-11 DIAGNOSIS — D582 Other hemoglobinopathies: Secondary | ICD-10-CM

## 2023-05-11 DIAGNOSIS — D329 Benign neoplasm of meninges, unspecified: Secondary | ICD-10-CM

## 2023-05-11 DIAGNOSIS — N1832 Chronic kidney disease, stage 3b: Secondary | ICD-10-CM | POA: Diagnosis not present

## 2023-05-11 DIAGNOSIS — I480 Paroxysmal atrial fibrillation: Secondary | ICD-10-CM

## 2023-05-11 DIAGNOSIS — I739 Peripheral vascular disease, unspecified: Secondary | ICD-10-CM | POA: Diagnosis not present

## 2023-05-11 DIAGNOSIS — E1165 Type 2 diabetes mellitus with hyperglycemia: Secondary | ICD-10-CM

## 2023-05-11 DIAGNOSIS — Z7985 Long-term (current) use of injectable non-insulin antidiabetic drugs: Secondary | ICD-10-CM

## 2023-05-11 DIAGNOSIS — E7800 Pure hypercholesterolemia, unspecified: Secondary | ICD-10-CM | POA: Diagnosis not present

## 2023-05-11 DIAGNOSIS — D6869 Other thrombophilia: Secondary | ICD-10-CM

## 2023-05-11 DIAGNOSIS — R748 Abnormal levels of other serum enzymes: Secondary | ICD-10-CM

## 2023-05-11 LAB — CBC WITH DIFFERENTIAL/PLATELET
Basophils Absolute: 0.1 10*3/uL (ref 0.0–0.1)
Basophils Relative: 1.2 % (ref 0.0–3.0)
Eosinophils Absolute: 0.2 10*3/uL (ref 0.0–0.7)
Eosinophils Relative: 2.6 % (ref 0.0–5.0)
HCT: 48.7 % — ABNORMAL HIGH (ref 36.0–46.0)
Hemoglobin: 15.9 g/dL — ABNORMAL HIGH (ref 12.0–15.0)
Lymphocytes Relative: 27 % (ref 12.0–46.0)
Lymphs Abs: 2 10*3/uL (ref 0.7–4.0)
MCHC: 32.7 g/dL (ref 30.0–36.0)
MCV: 91.3 fl (ref 78.0–100.0)
Monocytes Absolute: 0.6 10*3/uL (ref 0.1–1.0)
Monocytes Relative: 8.7 % (ref 3.0–12.0)
Neutro Abs: 4.5 10*3/uL (ref 1.4–7.7)
Neutrophils Relative %: 60.5 % (ref 43.0–77.0)
Platelets: 201 10*3/uL (ref 150.0–400.0)
RBC: 5.34 Mil/uL — ABNORMAL HIGH (ref 3.87–5.11)
RDW: 14.4 % (ref 11.5–15.5)
WBC: 7.4 10*3/uL (ref 4.0–10.5)

## 2023-05-11 LAB — HEPATIC FUNCTION PANEL
ALT: 24 U/L (ref 0–35)
AST: 26 U/L (ref 0–37)
Albumin: 4 g/dL (ref 3.5–5.2)
Alkaline Phosphatase: 125 U/L — ABNORMAL HIGH (ref 39–117)
Bilirubin, Direct: 0.3 mg/dL (ref 0.0–0.3)
Total Bilirubin: 1.4 mg/dL — ABNORMAL HIGH (ref 0.2–1.2)
Total Protein: 6.9 g/dL (ref 6.0–8.3)

## 2023-05-11 LAB — HM DIABETES FOOT EXAM

## 2023-05-13 DIAGNOSIS — H5201 Hypermetropia, right eye: Secondary | ICD-10-CM | POA: Diagnosis not present

## 2023-05-13 DIAGNOSIS — H40013 Open angle with borderline findings, low risk, bilateral: Secondary | ICD-10-CM | POA: Diagnosis not present

## 2023-05-16 ENCOUNTER — Encounter: Payer: Self-pay | Admitting: Internal Medicine

## 2023-05-16 NOTE — Assessment & Plan Note (Signed)
Afib on eliquis.

## 2023-05-16 NOTE — Assessment & Plan Note (Signed)
Previously saw GI.  Work-up including MRI/MRCP unrevealing.  Follow liver function test.  Recent alk phos level 130.

## 2023-05-16 NOTE — Assessment & Plan Note (Signed)
Discussed diabetes.  Low carb diet and exercise.  On mounjaro and tolerating.  Follow met b and A1c.

## 2023-05-16 NOTE — Assessment & Plan Note (Signed)
Recheck cbc to confirm stable.

## 2023-05-16 NOTE — Assessment & Plan Note (Signed)
Saw Dr Mariah Milling 10/13/22 - f/u afib. Was in afib. HR 115. Amlodipine was stopped and she was started on diltiazem 120mg . Losartan was decreased to 50mg  and continued on atenolol. Blood pressure elevated last visit.  Atenolol increased to 50mg .  Dr Mariah Milling sent in rx for 50mg  bid.  Saw Dr Lalla Brothers 05/01/23 - recommended atenolol 50mg  bid. Just started atenolol 50mg  bid after seeing Dr Lalla Brothers.

## 2023-05-16 NOTE — Assessment & Plan Note (Signed)
No pain.  Off gabapentin.  On lamictal. Follow.

## 2023-05-16 NOTE — Assessment & Plan Note (Signed)
Low-carb diet and exercise.  Follow met b and A1c.

## 2023-05-16 NOTE — Assessment & Plan Note (Signed)
Saw Dr Mariah Milling 10/2022.  Amlodipine was changed to diltiazem - due to afib with RVR.  Continues on losartan - on 50mg  q day now (dose changed last cardiology visit).  Blood pressure elevated today.  Just started atenolol 50mg  bid.  Will hold on making changes today.  Have her spot check pressures.  Send in readings.  May need to increase losartan back up to 100mg  q day.

## 2023-05-16 NOTE — Assessment & Plan Note (Signed)
Status post angioplasty.  On Eliquis.  Continue risk factor modification.  Continue statin.

## 2023-05-16 NOTE — Assessment & Plan Note (Signed)
Being followed by neurology.  Status post gamma knife treatment.  No pain currently.  Seeing Dr. Epimenio Foot.  Currently on Lamictal and amitriptyline.

## 2023-05-16 NOTE — Assessment & Plan Note (Signed)
Off meloxicam.   Seeing nephrology.  Continue to avoid antiinflammatories. Stay hydrated.  Continue losartan.

## 2023-05-16 NOTE — Assessment & Plan Note (Signed)
Continue Lipitor.  Low-cholesterol diet and exercise.  Follow lipid panel liver function test.

## 2023-05-19 ENCOUNTER — Telehealth: Payer: Self-pay

## 2023-05-19 DIAGNOSIS — R7989 Other specified abnormal findings of blood chemistry: Secondary | ICD-10-CM

## 2023-05-19 DIAGNOSIS — I10 Essential (primary) hypertension: Secondary | ICD-10-CM

## 2023-05-19 DIAGNOSIS — D582 Other hemoglobinopathies: Secondary | ICD-10-CM

## 2023-05-19 NOTE — Telephone Encounter (Signed)
Order placed for pulmonary referral.

## 2023-05-19 NOTE — Telephone Encounter (Signed)
Just need dx for referral to pulmonary for sleep consult. Referral is pended.

## 2023-05-19 NOTE — Addendum Note (Signed)
Addended by: Charm Barges on: 05/19/2023 08:48 PM   Modules accepted: Orders

## 2023-05-19 NOTE — Telephone Encounter (Signed)
-----   Message from [place] sent at 05/16/2023 11:15 PM EDT ----- See me before calling. Notify - alkaline phos and bilirubin slightly elevated.  Has been worked up and followed by GI.  Previous MRI abdomen ok.  Will need to follow.  Hgb stable. Slightly elevated, but stable.  Please confirm with her if she has been checked for sleep apnea.  If not, see if agreeable to schedule home sleep test.  Will need referral to pulmonary if agreeable. Recheck liver panel - 3-4 weeks.  Non fasting lab.

## 2023-05-19 NOTE — Telephone Encounter (Signed)
Patient returned office phone call and note was read. No she has never been checked for sleep apnea. Patient agrees to home sleep test. Patient stated she will call back to schedule labs to recheck her kidney.

## 2023-05-19 NOTE — Addendum Note (Signed)
Addended by: Rita Ohara D on: 05/19/2023 01:46 PM   Modules accepted: Orders

## 2023-05-24 ENCOUNTER — Other Ambulatory Visit: Payer: Self-pay | Admitting: Neurology

## 2023-05-27 NOTE — Telephone Encounter (Signed)
Last seen on 04/08/23 Follow up scheduled on 04/08/23

## 2023-05-28 NOTE — Progress Notes (Signed)
Cardiology Office Note Date:  05/29/2023  Patient ID:  Kristin James, Kristin James 1956-02-26, MRN [id number] PCP:  Dale [place], MD  Cardiologist:  Julien Nordmann, MD Electrophysiologist: Lanier Prude, MD     Chief Complaint: 1 mon afib follow-up  History of Present Illness: Kristin James is a 67 y.o. female with PMH notable for parox AFib, CKD-3, HTN; seen today for Lanier Prude, MD for routine electrophysiology followup.  She last saw Dr. Lalla Brothers 04/2023 for afib consult. She has h/o asymptomatic afib going back until 2015. Was on amiodarone at some point. Given asymptomatic nature and well-controlled ventricular rates, rate-control strategy was decided upon. Her BP was elevated, so Dr. Lalla Brothers increased atenolol to BID (was taking daily).   On follow-up today, she brings a daily log of home BP and pulse readings. Pulses are routinely 60-80. BPs are often 110-140, rarely in 80-90s systolic. When systolic is low she is dizzy and a little lightheaded, no syncope. During this times, she realizes that she hasn't eaten anything during the day. She is on mounjaro and has very little appetite. When she eats a snack when BP is low, her BP rises and she is no longer dizzy or lightheaded.  Has no palpitations, no chest pain or chest pressure. No SOB.   Continues to take eliquis BID, no missed doses, no bleeding concerns.    AAD History: Amiodarone   Past Medical History:  Diagnosis Date   Afib (HCC)    Cellulitis of right arm    Embolus to lower extremity (HCC)    a. 05/2014 s/p thrombolysis of LLE.   Hiatal hernia    History of echocardiogram    a. 07/2020 Echo: EF 55-60%, no rwma, Nl RV size/fxn. Mildly dil LA. Mild MR/MS. Sev mitral annular Ca2+. Mild Ao sclerosis w/o stenosis.   History of stress test    a. 05/2014 lexiscan MV: EF 67%, no ischemia/infarct.   Hypercholesteremia    Hyperglycemia    Hypertension    Left Cerebellopontine Angle Meningioma (HCC)    a. 10/2016 s/p  Gamma knife.   Osteoarthritis    PAF (paroxysmal atrial fibrillation) (HCC)    a. Dx 10/2014->converted on amio. CHA2DS2VASc = 3-4-->Eliquis.   Proteinuria    Shingles    dec 2021   Spinal stenosis at L4-L5 level    Thrombophlebitis of posterior tibial vein (HCC) 06/01/2014   Trigeminal neuralgia 08/2016    Past Surgical History:  Procedure Laterality Date   AORTOGRAM     BACK SURGERY  11/13/2014   CARDIOVERSION N/A 07/19/2020   Procedure: CARDIOVERSION;  Surgeon: Antonieta Iba, MD;  Location: [facility] ORS;  Service: Cardiovascular;  Laterality: N/A;   CHOLECYSTECTOMY     COLONOSCOPY WITH PROPOFOL N/A 08/15/2016   Procedure: COLONOSCOPY WITH PROPOFOL;  Surgeon: Christena Deem, MD;  Location: [facility] ENDOSCOPY;  Service: Endoscopy;  Laterality: N/A;   COLONOSCOPY WITH PROPOFOL N/A 11/07/2022   Procedure: COLONOSCOPY WITH PROPOFOL;  Surgeon: Regis Bill, MD;  Location: [facility] ENDOSCOPY;  Service: Endoscopy;  Laterality: N/A;   KNEE ARTHROSCOPY     LOWER EXTREMITY ANGIOGRAM      Current Outpatient Medications  Medication Instructions   acetaminophen (TYLENOL) 650 MG CR tablet Oral   amitriptyline (ELAVIL) 25 MG tablet TAKE 1 TABLET(25 MG) BY MOUTH AT BEDTIME.   atenolol (TENORMIN) 50 mg, Oral, 2 times daily   atorvastatin (LIPITOR) 40 MG tablet TAKE 1 TABLET BY MOUTH EVERY DAY   diltiazem (CARDIZEM CD) 120  mg, Oral, Daily   ELIQUIS 5 MG TABS tablet TAKE 1 TABLET(5 MG) BY MOUTH TWICE DAILY   lamoTRIgine (LAMICTAL) 100 MG tablet TAKE 1 TABLET(100 MG) BY MOUTH TWICE DAILY   losartan (COZAAR) 100 MG tablet TAKE 1 TABLET(100 MG) BY MOUTH DAILY   tirzepatide (MOUNJARO) 7.5 mg, Subcutaneous, Weekly    Social History:  The patient  reports that she has never smoked. She has never used smokeless tobacco. She reports current alcohol use. She reports that she does not use drugs.   Family History:  The patient's family history includes Breast cancer in her paternal grandmother; Dementia  in her mother; Hypertension in her father and mother; Stroke in her father.  ROS:  Please see the history of present illness. All other systems are reviewed and otherwise negative.   PHYSICAL EXAM:  VS:  BP (!) 150/90 (BP Location: Left Arm, Patient Position: Sitting, Cuff Size: Normal)   Pulse (!) 109   Ht 5' (1.524 m)   Wt 173 lb 3.2 oz (78.6 kg)   SpO2 98%   BMI 33.83 kg/m  BMI: Body mass index is 33.83 kg/m.  Vitals:   05/29/23 1000 05/29/23 1025  BP: (!) 150/90 138/88  Pulse: (!) 109   Height: 5' (1.524 m)   Weight: 173 lb 3.2 oz (78.6 kg)   SpO2: 98%   BMI (Calculated): 33.83     GEN- The patient is well appearing, alert and oriented x 3 today.   Lungs- Clear to ausculation bilaterally, normal work of breathing.  Heart- Irregularly irregular rate and rhythm, no murmurs, rubs or gallops Extremities- No peripheral edema, warm, dry   EKG is ordered. Personal review of EKG from today shows:     EKG Interpretation Date/Time:  Friday May 29 2023 10:08:51 EDT Ventricular Rate:  109 PR Interval:    QRS Duration:  88 QT Interval:  348 QTC Calculation: 468 R Axis:   17  Text Interpretation: Atrial flutter with variable A-V block Nonspecific ST and T wave abnormality Confirmed by Sherie Don [id number]) on 05/29/2023 10:11:36 AM    Recent Labs: 03/09/2023: BUN 22; Creatinine, Ser 1.18; Potassium 4.0; Sodium 142; TSH 2.95 05/11/2023: ALT 24; Hemoglobin 15.9; Platelets 201.0  03/09/2023: Cholesterol 121; HDL 45.80; LDL Cholesterol 62; Total CHOL/HDL Ratio 3; Triglycerides 67.0; VLDL 13.4   CrCl cannot be calculated (Patient's most recent lab result is older than the maximum 21 days allowed.).   Wt Readings from Last 3 Encounters:  05/29/23 173 lb 3.2 oz (78.6 kg)  05/11/23 172 lb 12.8 oz (78.4 kg)  05/01/23 174 lb (78.9 kg)     Additional studies reviewed include: Previous EP, cardiology notes.   TTE, 07/10/2020  1. Left ventricular ejection fraction, by estimation,  is 55 to 60%. The left ventricle has normal function. The left ventricle has no regional wall motion abnormalities. Left ventricular diastolic parameters are indeterminate.   2. Right ventricular systolic function is normal. The right ventricular size is normal. Tricuspid regurgitation signal is inadequate for assessing PA pressure.   3. Left atrial size was mildly dilated.   4. The pericardial effusion is posterior to the left ventricle.   5. The mitral valve is degenerative. Mild mitral valve regurgitation. Mild mitral stenosis. Severe mitral annular calcification.   6. The aortic valve is tricuspid. There is mild thickening of the aortic valve. Aortic valve regurgitation is not visualized. Mild aortic valve sclerosis is present, with no evidence of aortic valve stenosis.    ASSESSMENT AND  PLAN:  #) perm AFib Continues to be asymptomatic of afib Ventricular rates well-controlled on home log  #) Hypercoag d/t parox afib CHA2DS2-VASc Score = 5 [CHF History: 0, HTN History: 1, Diabetes History: 1, Stroke History: 0, Vascular Disease History: 1, Age Score: 1, Gender Score: 1].  Therefore, the patient's annual risk of stroke is 7.2 %.    Stroke ppx - 5mg  eliquis BID, appropriately dosed No bleeding concerns  #) HTN Well-controlled on home log and on repeat check in clinic Encouraged her to regularly eat and drink water to prevent hypotension Discussed that as she continues to lose weight, anticipate BP meds will need to be reduced Continue atenolol 50mg  BID, dilt 120 daily, losartan 100mg        Current medicines are reviewed at length with the patient today.   The patient does not have concerns regarding her medicines.  The following changes were made today:  none  Labs/ tests ordered today include:  Orders Placed This Encounter  Procedures   EKG 12-Lead     Disposition: Follow up with Dr. Lalla Brothers or EP APP  9 months.  Follow-up with gen cards in 3-4 months  Signed, Sherie Don, NP  05/29/23  10:11 AM  Electrophysiology [facility]

## 2023-05-29 ENCOUNTER — Ambulatory Visit: Payer: PPO | Attending: Cardiology | Admitting: Cardiology

## 2023-05-29 ENCOUNTER — Encounter: Payer: Self-pay | Admitting: Cardiology

## 2023-05-29 VITALS — BP 138/88 | HR 109 | Ht 60.0 in | Wt 173.2 lb

## 2023-05-29 DIAGNOSIS — I480 Paroxysmal atrial fibrillation: Secondary | ICD-10-CM

## 2023-05-29 DIAGNOSIS — D6869 Other thrombophilia: Secondary | ICD-10-CM | POA: Diagnosis not present

## 2023-05-29 DIAGNOSIS — I10 Essential (primary) hypertension: Secondary | ICD-10-CM

## 2023-05-29 DIAGNOSIS — I4821 Permanent atrial fibrillation: Secondary | ICD-10-CM | POA: Diagnosis not present

## 2023-05-29 NOTE — Patient Instructions (Signed)
Medication Instructions:  The current medical regimen is effective;  continue present plan and medications.  *If you need a refill on your cardiac medications before your next appointment, please call your pharmacy*   Follow-Up: At [facility], you and your health needs are our priority.  As part of our continuing mission to provide you with exceptional heart care, we have created designated Provider Care Teams.  These Care Teams include your primary Cardiologist (physician) and Advanced Practice Providers (APPs -  Physician Assistants and Nurse Practitioners) who all work together to provide you with the care you need, when you need it.  We recommend signing up for the patient portal called "MyChart".  Sign up information is provided on this After Visit Summary.  MyChart is used to connect with patients for Virtual Visits (Telemedicine).  Patients are able to view lab/test results, encounter notes, upcoming appointments, etc.  Non-urgent messages can be sent to your provider as well.   To learn more about what you can do with MyChart, go to [email or web address].    Your next appointment:   6 month(s)  Provider:   Julien Nordmann, MD   Back to Sherie Don, NP in 9 months.    Other Instructions Please continue to take BP/HR at home, please let us know of any lightheaded or dizziness. Remember to eat!

## 2023-06-02 ENCOUNTER — Telehealth: Payer: Self-pay

## 2023-06-02 NOTE — Telephone Encounter (Signed)
Patient is calling to speak back with Melissa. I told patent that she may have been calling to get her scheduled for an appointment since she has not been seen with [place] before. She preferred to speak with Melissa.

## 2023-06-08 NOTE — Telephone Encounter (Signed)
Left message for patient to call back or send a mychart message.

## 2023-07-16 ENCOUNTER — Other Ambulatory Visit (INDEPENDENT_AMBULATORY_CARE_PROVIDER_SITE_OTHER): Payer: PPO

## 2023-07-16 ENCOUNTER — Other Ambulatory Visit: Payer: PPO

## 2023-07-16 DIAGNOSIS — R7989 Other specified abnormal findings of blood chemistry: Secondary | ICD-10-CM

## 2023-07-16 LAB — HEPATIC FUNCTION PANEL
ALT: 25 U/L (ref 0–35)
AST: 25 U/L (ref 0–37)
Albumin: 4.3 g/dL (ref 3.5–5.2)
Alkaline Phosphatase: 128 U/L — ABNORMAL HIGH (ref 39–117)
Bilirubin, Direct: 0.3 mg/dL (ref 0.0–0.3)
Total Bilirubin: 1.7 mg/dL — ABNORMAL HIGH (ref 0.2–1.2)
Total Protein: 6.7 g/dL (ref 6.0–8.3)

## 2023-07-20 ENCOUNTER — Ambulatory Visit: Payer: PPO | Admitting: Internal Medicine

## 2023-07-22 ENCOUNTER — Other Ambulatory Visit: Payer: PPO

## 2023-07-28 ENCOUNTER — Other Ambulatory Visit: Payer: Self-pay | Admitting: Internal Medicine

## 2023-07-28 DIAGNOSIS — Z1231 Encounter for screening mammogram for malignant neoplasm of breast: Secondary | ICD-10-CM

## 2023-08-15 ENCOUNTER — Other Ambulatory Visit: Payer: Self-pay | Admitting: Cardiovascular Disease

## 2023-08-15 DIAGNOSIS — I480 Paroxysmal atrial fibrillation: Secondary | ICD-10-CM

## 2023-08-17 ENCOUNTER — Telehealth: Payer: Self-pay | Admitting: Internal Medicine

## 2023-08-17 ENCOUNTER — Other Ambulatory Visit: Payer: Self-pay

## 2023-08-17 MED ORDER — TIRZEPATIDE 7.5 MG/0.5ML ~~LOC~~ SOAJ: 7.5000 mg | SUBCUTANEOUS | 1 refills | Status: DC

## 2023-08-17 NOTE — Telephone Encounter (Signed)
Mounjaro refilled.  °

## 2023-08-17 NOTE — Telephone Encounter (Signed)
Prescription Request  08/17/2023  LOV: 05/11/2023  What is the name of the medication or equipment? mounjaro  Have you contacted your pharmacy to request a refill? No   Which pharmacy would you like this sent to? [facility]   Patient notified that their request is being sent to the clinical staff for review and that they should receive a response within 2 business days.   Please advise at Mobile [phone] (mobile)

## 2023-08-17 NOTE — Telephone Encounter (Signed)
Prescription refill request for Eliquis received. Indication:afib Last office visit:9/24 Scr:1.18  7/24 Age: 67 Weight:78.6  kg  Prescription refilled

## 2023-08-19 ENCOUNTER — Ambulatory Visit
Admission: RE | Admit: 2023-08-19 | Discharge: 2023-08-19 | Disposition: A | Discharge status: Home / Self Care | Payer: PPO | Source: Ambulatory Visit | Attending: Internal Medicine | Admitting: Internal Medicine

## 2023-08-19 DIAGNOSIS — Z1231 Encounter for screening mammogram for malignant neoplasm of breast: Secondary | ICD-10-CM | POA: Insufficient documentation

## 2023-09-01 ENCOUNTER — Telehealth: Payer: Self-pay | Admitting: Cardiovascular Disease

## 2023-09-01 DIAGNOSIS — N1831 Chronic kidney disease, stage 3a: Secondary | ICD-10-CM | POA: Diagnosis not present

## 2023-09-01 DIAGNOSIS — E118 Type 2 diabetes mellitus with unspecified complications: Secondary | ICD-10-CM | POA: Diagnosis not present

## 2023-09-01 DIAGNOSIS — I10 Essential (primary) hypertension: Secondary | ICD-10-CM | POA: Diagnosis not present

## 2023-09-01 NOTE — Telephone Encounter (Signed)
 Pt c/o BP issue: STAT if pt c/o blurred vision, one-sided weakness or slurred speech  1. What are your last 5 BP readings?  12/31: 160/116  2. Are you having any other symptoms (ex. Dizziness, headache, blurred vision, passed out)?  No   3. What is your BP issue?   BP has been fluctuating/high. She is asymptomatic.

## 2023-09-01 NOTE — Telephone Encounter (Signed)
 Spoke with patient and she stated that her BP has been trending high lately. It was been going high and coming back down but goes higher each time, I don't know if it's white coat syndrome but I do want to have it looked at to see if my meds need to be adjusted. Patient stated that her most recent BP readings have been 152/94, 148/97, 149/93, 160/116. Patient has appointment with PCP Friday 09/11/23 and is now scheduled with Dr. Gollan Tuesday 09/22/23. Patient denies chest pain, headaches, shortness of breath, nosebleeds, dizziness, nausea and vomiting, blurred vision or other vision changes, anxiety and sudden, severe pain in the abdomen, chest, or back

## 2023-09-11 ENCOUNTER — Encounter: Payer: Self-pay | Admitting: Internal Medicine

## 2023-09-11 ENCOUNTER — Ambulatory Visit (INDEPENDENT_AMBULATORY_CARE_PROVIDER_SITE_OTHER): Payer: PPO | Admitting: Internal Medicine

## 2023-09-11 VITALS — BP 130/90 | HR 100 | Temp 97.6°F | Ht 60.0 in | Wt 166.8 lb

## 2023-09-11 DIAGNOSIS — Z0000 Encounter for general adult medical examination without abnormal findings: Secondary | ICD-10-CM | POA: Diagnosis not present

## 2023-09-11 DIAGNOSIS — E7800 Pure hypercholesterolemia, unspecified: Secondary | ICD-10-CM | POA: Diagnosis not present

## 2023-09-11 DIAGNOSIS — G500 Trigeminal neuralgia: Secondary | ICD-10-CM

## 2023-09-11 DIAGNOSIS — R739 Hyperglycemia, unspecified: Secondary | ICD-10-CM | POA: Diagnosis not present

## 2023-09-11 DIAGNOSIS — I10 Essential (primary) hypertension: Secondary | ICD-10-CM

## 2023-09-11 DIAGNOSIS — D329 Benign neoplasm of meninges, unspecified: Secondary | ICD-10-CM | POA: Diagnosis not present

## 2023-09-11 DIAGNOSIS — N1832 Chronic kidney disease, stage 3b: Secondary | ICD-10-CM | POA: Diagnosis not present

## 2023-09-11 DIAGNOSIS — I480 Paroxysmal atrial fibrillation: Secondary | ICD-10-CM | POA: Diagnosis not present

## 2023-09-11 DIAGNOSIS — E1165 Type 2 diabetes mellitus with hyperglycemia: Secondary | ICD-10-CM

## 2023-09-11 DIAGNOSIS — I739 Peripheral vascular disease, unspecified: Secondary | ICD-10-CM | POA: Diagnosis not present

## 2023-09-11 LAB — BASIC METABOLIC PANEL
BUN: 25 mg/dL — ABNORMAL HIGH (ref 6–23)
CO2: 28 meq/L (ref 19–32)
Calcium: 9.5 mg/dL (ref 8.4–10.5)
Chloride: 104 meq/L (ref 96–112)
Creatinine, Ser: 1.06 mg/dL (ref 0.40–1.20)
GFR: 54.1 mL/min — ABNORMAL LOW (ref >60.00)
Glucose, Bld: 92 mg/dL (ref 70–99)
Potassium: 4.2 meq/L (ref 3.5–5.1)
Sodium: 141 meq/L (ref 135–145)

## 2023-09-11 LAB — CBC WITH DIFFERENTIAL/PLATELET
Basophils Absolute: 0.1 10*3/uL (ref 0.0–0.1)
Basophils Relative: 0.8 % (ref 0.0–3.0)
Eosinophils Absolute: 0.2 10*3/uL (ref 0.0–0.7)
Eosinophils Relative: 1.9 % (ref 0.0–5.0)
HCT: 50.1 % — ABNORMAL HIGH (ref 36.0–46.0)
Hemoglobin: 16.8 g/dL — ABNORMAL HIGH (ref 12.0–15.0)
Lymphocytes Relative: 24.7 % (ref 12.0–46.0)
Lymphs Abs: 2.1 10*3/uL (ref 0.7–4.0)
MCHC: 33.4 g/dL (ref 30.0–36.0)
MCV: 94.3 fL (ref 78.0–100.0)
Monocytes Absolute: 0.7 10*3/uL (ref 0.1–1.0)
Monocytes Relative: 7.7 % (ref 3.0–12.0)
Neutro Abs: 5.5 10*3/uL (ref 1.4–7.7)
Neutrophils Relative %: 64.9 % (ref 43.0–77.0)
Platelets: 222 10*3/uL (ref 150.0–400.0)
RBC: 5.32 Mil/uL — ABNORMAL HIGH (ref 3.87–5.11)
RDW: 12.9 % (ref 11.5–15.5)
WBC: 8.5 10*3/uL (ref 4.0–10.5)

## 2023-09-11 LAB — HEPATIC FUNCTION PANEL
ALT: 22 U/L (ref 0–35)
AST: 24 U/L (ref 0–37)
Albumin: 4.3 g/dL (ref 3.5–5.2)
Alkaline Phosphatase: 135 U/L — ABNORMAL HIGH (ref 39–117)
Bilirubin, Direct: 0.2 mg/dL (ref 0.0–0.3)
Total Bilirubin: 0.9 mg/dL (ref 0.2–1.2)
Total Protein: 7.2 g/dL (ref 6.0–8.3)

## 2023-09-11 LAB — LIPID PANEL
Cholesterol: 150 mg/dL (ref 0–200)
HDL: 47.5 mg/dL (ref >39.00)
LDL Cholesterol: 85 mg/dL (ref 0–99)
NonHDL: 102.29
Total CHOL/HDL Ratio: 3
Triglycerides: 86 mg/dL (ref 0.0–149.0)
VLDL: 17.2 mg/dL (ref 0.0–40.0)

## 2023-09-11 LAB — HEMOGLOBIN A1C: Hgb A1c MFr Bld: 6 % (ref 4.6–6.5)

## 2023-09-11 MED ORDER — TIRZEPATIDE 7.5 MG/0.5ML ~~LOC~~ SOAJ: 7.5000 mg | SUBCUTANEOUS | 1 refills | Status: DC

## 2023-09-11 NOTE — Assessment & Plan Note (Addendum)
 Physical today 09/11/23.  PAP 11/29/19 - negative with negative HPV. PAP 03/25/22.  Mammogram 08/19/23 - Birads I.  Colonoscopy 08/2016 - recommended f/u in 5 years. Colonoscopy 10/2022 - diverticulosis. Inadequate prep.  Recommended f/u colonoscopy in 6-12 months.

## 2023-09-11 NOTE — Progress Notes (Signed)
 Subjective:    Patient ID: Kristin James, female    DOB: 12-Oct-1955, 68 y.o.   MRN: [id number]  Patient here for  Chief Complaint  Patient presents with   Annual Exam    HPI Here for a physical exam. She reports she is doing relatively well. Tries to stay active. No chest pain or sob reported. She had f/u with cardiology 05/29/23. Previously had atenolol  increased to bid (from daily dosing). No changes made. Today - reviewed outside blood pressure readings. Blood pressure varying - 96-140/70-90. Pulse rated mostly 70-80s. Reports occasionally when she stands, will notice being a little light headed. This happens on occasion. Discussed importance of eating and staying hydrated. She is on mounjaro .  Some decreased appetite, but states is eating. Has known CKD. Followed by nephrology. Discussed with her regarding starting SGLT 2 inhibitor.    Past Medical History:  Diagnosis Date   Afib (HCC)    Cellulitis of right arm    Embolus to lower extremity (HCC)    a. 05/2014 s/p thrombolysis of LLE.   Hiatal hernia    History of echocardiogram    a. 07/2020 Echo: EF 55-60%, no rwma, Nl RV size/fxn. Mildly dil LA. Mild MR/MS. Sev mitral annular Ca2+. Mild Ao sclerosis w/o stenosis.   History of stress test    a. 05/2014 lexiscan MV: EF 67%, no ischemia/infarct.   Hypercholesteremia    Hyperglycemia    Hypertension    Left Cerebellopontine Angle Meningioma (HCC)    a. 10/2016 s/p Gamma knife.   Osteoarthritis    PAF (paroxysmal atrial fibrillation) (HCC)    a. Dx 10/2014->converted on amio. CHA2DS2VASc = 3-4-->Eliquis .   Proteinuria    Shingles    dec 2021   Spinal stenosis at L4-L5 level    Thrombophlebitis of posterior tibial vein (HCC) 06/01/2014   Trigeminal neuralgia 08/2016   Past Surgical History:  Procedure Laterality Date   AORTOGRAM     BACK SURGERY  11/13/2014   CARDIOVERSION N/A 07/19/2020   Procedure: CARDIOVERSION;  Surgeon: Perla Evalene PARAS, MD;  Location: [facility] ORS;   Service: Cardiovascular;  Laterality: N/A;   CHOLECYSTECTOMY     COLONOSCOPY WITH PROPOFOL  N/A 08/15/2016   Procedure: COLONOSCOPY WITH PROPOFOL ;  Surgeon: Gladis RAYMOND Mariner, MD;  Location: [facility] ENDOSCOPY;  Service: Endoscopy;  Laterality: N/A;   COLONOSCOPY WITH PROPOFOL  N/A 11/07/2022   Procedure: COLONOSCOPY WITH PROPOFOL ;  Surgeon: Maryruth Ole DASEN, MD;  Location: [facility] ENDOSCOPY;  Service: Endoscopy;  Laterality: N/A;   KNEE ARTHROSCOPY     LOWER EXTREMITY ANGIOGRAM     Family History  Problem Relation Age of Onset   Hypertension Mother    Dementia Mother    Stroke Father        3 strokes   Hypertension Father    Breast cancer Paternal Grandmother    Colon cancer Neg Hx    Social History   Socioeconomic History   Marital status: Married    Spouse name: Not on file   Number of children: 2   Years of education: Not on file   Highest education level: Not on file  Occupational History   Not on file  Tobacco Use   Smoking status: Never   Smokeless tobacco: Never  Vaping Use   Vaping status: Never Used  Substance and Sexual Activity   Alcohol use: Yes    Alcohol/week: 0.0 standard drinks of alcohol    Comment: occ. wine   Drug use: No   Sexual  activity: Not on file  Other Topics Concern   Not on file  Social History Narrative   Right handed    Caffeine use: coffee (1/2 cup per day)   Social Drivers of Health   Financial Resource Strain: Not on file  Food Insecurity: Not on file  Transportation Needs: Not on file  Physical Activity: Unknown (09/07/2023)   Exercise Vital Sign    Days of Exercise per Week: 2 days    Minutes of Exercise per Session: Not on file  Stress: No Stress Concern Present (09/07/2023)   [employer] of Occupational Health - Occupational Stress Questionnaire    Feeling of Stress : Only a little  Social Connections: Unknown (09/07/2023)   Social Connection and Isolation Panel [NHANES]    Frequency of Communication with Friends and Family:  Not on file    Frequency of Social Gatherings with Friends and Family: Not on file    Attends Religious Services: Not on file    Active Member of Clubs or Organizations: Yes    Attends Banker Meetings: Not on file    Marital Status: Not on file     Review of Systems  Constitutional:  Negative for unexpected weight change.       Decrease in appetite. On mounjaro .   HENT:  Negative for congestion, sinus pressure and sore throat.   Eyes:  Negative for pain and visual disturbance.  Respiratory:  Negative for cough, chest tightness and shortness of breath.   Cardiovascular:  Negative for chest pain and palpitations.  Gastrointestinal:  Negative for abdominal pain, diarrhea, nausea and vomiting.  Genitourinary:  Negative for difficulty urinating and dysuria.  Musculoskeletal:  Negative for joint swelling and myalgias.  Skin:  Negative for color change and rash.  Neurological:  Negative for dizziness and headaches.       Occasional light headedness as outlined.   Hematological:  Negative for adenopathy. Does not bruise/bleed easily.  Psychiatric/Behavioral:  Negative for agitation and dysphoric mood.        Objective:     BP (!) 130/90   Pulse 100   Temp 97.6 F (36.4 C) (Oral)   Ht 5' (1.524 m)   Wt 166 lb 12.8 oz (75.7 kg)   SpO2 99%   BMI 32.58 kg/m  Wt Readings from Last 3 Encounters:  09/11/23 166 lb 12.8 oz (75.7 kg)  05/29/23 173 lb 3.2 oz (78.6 kg)  05/11/23 172 lb 12.8 oz (78.4 kg)    Physical Exam Vitals reviewed.  Constitutional:      General: She is not in acute distress.    Appearance: Normal appearance. She is well-developed.  HENT:     Head: Normocephalic and atraumatic.     Right Ear: External ear normal.     Left Ear: External ear normal.     Mouth/Throat:     Pharynx: No oropharyngeal exudate or posterior oropharyngeal erythema.  Eyes:     General: No scleral icterus.       Right eye: No discharge.        Left eye: No discharge.      Conjunctiva/sclera: Conjunctivae normal.  Neck:     Thyroid : No thyromegaly.  Cardiovascular:     Rate and Rhythm: Normal rate and regular rhythm.  Pulmonary:     Effort: No tachypnea, accessory muscle usage or respiratory distress.     Breath sounds: Normal breath sounds. No decreased breath sounds or wheezing.  Chest:  Breasts:    Right: No  inverted nipple, mass, nipple discharge or tenderness (no axillary adenopathy).     Left: No inverted nipple, mass, nipple discharge or tenderness (no axilarry adenopathy).  Abdominal:     General: Bowel sounds are normal.     Palpations: Abdomen is soft.     Tenderness: There is no abdominal tenderness.  Musculoskeletal:        General: No swelling or tenderness.     Cervical back: Neck supple.  Lymphadenopathy:     Cervical: No cervical adenopathy.  Skin:    Findings: No erythema or rash.  Neurological:     Mental Status: She is alert and oriented to person, place, and time.  Psychiatric:        Mood and Affect: Mood normal.        Behavior: Behavior normal.      Outpatient Encounter Medications as of 09/11/2023  Medication Sig   acetaminophen  (TYLENOL ) 650 MG CR tablet Take by mouth.   atenolol  (TENORMIN ) 50 MG tablet Take 1 tablet (50 mg total) by mouth 2 (two) times daily.   atorvastatin  (LIPITOR) 40 MG tablet TAKE 1 TABLET BY MOUTH EVERY DAY   ELIQUIS  5 MG TABS tablet TAKE 1 TABLET(5 MG) BY MOUTH TWICE DAILY   lamoTRIgine  (LAMICTAL ) 100 MG tablet TAKE 1 TABLET(100 MG) BY MOUTH TWICE DAILY   losartan  (COZAAR ) 100 MG tablet TAKE 1 TABLET(100 MG) BY MOUTH DAILY   [DISCONTINUED] tirzepatide  (MOUNJARO ) 7.5 MG/0.5ML Pen Inject 7.5 mg into the skin once a week.   diltiazem  (CARDIZEM  CD) 120 MG 24 hr capsule Take 1 capsule (120 mg total) by mouth daily.   tirzepatide  (MOUNJARO ) 7.5 MG/0.5ML Pen Inject 7.5 mg into the skin once a week.   [DISCONTINUED] amitriptyline  (ELAVIL ) 25 MG tablet TAKE 1 TABLET(25 MG) BY MOUTH AT BEDTIME.   No  facility-administered encounter medications on file as of 09/11/2023.     Lab Results  Component Value Date   WBC 8.5 09/11/2023   HGB 16.8 (H) 09/11/2023   HCT 50.1 (H) 09/11/2023   PLT 222.0 09/11/2023   GLUCOSE 92 09/11/2023   CHOL 150 09/11/2023   TRIG 86.0 09/11/2023   HDL 47.50 09/11/2023   LDLCALC 85 09/11/2023   ALT 22 09/11/2023   AST 24 09/11/2023   NA 141 09/11/2023   K 4.2 09/11/2023   CL 104 09/11/2023   CREATININE 1.06 09/11/2023   BUN 25 (H) 09/11/2023   CO2 28 09/11/2023   TSH 2.95 03/09/2023   INR 1.5 (H) 06/03/2020   HGBA1C 6.0 09/11/2023   MICROALBUR 137.0 (H) 03/09/2023    MM 3D SCREENING MAMMOGRAM BILATERAL BREAST Result Date: 08/21/2023 CLINICAL DATA:  Screening. EXAM: DIGITAL SCREENING BILATERAL MAMMOGRAM WITH TOMOSYNTHESIS AND CAD TECHNIQUE: Bilateral screening digital craniocaudal and mediolateral oblique mammograms were obtained. Bilateral screening digital breast tomosynthesis was performed. The images were evaluated with computer-aided detection. COMPARISON:  Previous exam(s). ACR Breast Density Category b: There are scattered areas of fibroglandular density. FINDINGS: There are no findings suspicious for malignancy. IMPRESSION: No mammographic evidence of malignancy. A result letter of this screening mammogram will be mailed directly to the patient. RECOMMENDATION: Screening mammogram in one year. (Code:SM-B-01Y) BI-RADS CATEGORY  1: Negative. Electronically Signed   By: Rosaline Collet M.D.   On: 08/21/2023 12:18       Assessment & Plan:  Routine general medical examination at a health care facility  Health care maintenance Assessment & Plan: Physical today 09/11/23.  PAP 11/29/19 - negative with negative HPV. PAP 03/25/22.  Mammogram 08/19/23 - Birads I.  Colonoscopy 08/2016 - recommended f/u in 5 years. Colonoscopy 10/2022 - diverticulosis. Inadequate prep.  Recommended f/u colonoscopy in 6-12 months.    Hypercholesterolemia Assessment &  Plan: Continue Lipitor.  Low cholesterol diet and exercise.  Follow lipid panel liver function test.  Orders: -     Lipid panel -     Hepatic function panel  Hyperglycemia Assessment & Plan: Low-carb diet and exercise.  Follow met b and A1c.  Orders: -     Hemoglobin A1c  Primary hypertension Assessment & Plan: Currently on losartan , atenolol  and diltiazem . Blood pressures varying as outlined.  Discussed adding SGLT2 inhibitor - given CKD. Discussed may affect blood pressure. Concern regarding adding with current fluctuations. Due to see Dr Gollan 09/22/23. Would like to get his input regarding blood pressures and treatment.   Orders: -     Basic metabolic panel -     CBC with Differential/Platelet  Type 2 diabetes mellitus with hyperglycemia, without long-term current use of insulin (HCC) Assessment & Plan: Discussed diabetes.  Low carb diet and exercise.  On mounjaro . States she is tolerating. Has lost weight. Decrease in appetite. Discussed adding SGLT 2 inhibitor for CKD. Concern regarding fluctuating blood pressures now. Sees Dr Gollan 09/22/23. Will get his input as well.  Follow met b and A1c.    Trigeminal neuralgia of left side of face Assessment & Plan: No pain.  Off gabapentin .  On lamictal . Follow.    Paroxysmal atrial fibrillation [facility]) Assessment & Plan: Saw Dr Gollan 10/13/22 - f/u afib. Was in afib. HR 115. Amlodipine  was stopped and she was started on diltiazem  120mg . Losartan  was decreased to 50mg  and continued on atenolol .Saw Dr Cindie 05/01/23 - recommended atenolol  50mg  bid. Has done relatively well recently. Continue current medication at this time. No changes. Keep f/u appt with cardiology 09/20/23.    PAD (peripheral artery disease) (HCC) Assessment & Plan: Status post angioplasty.  On Eliquis .  Continue risk factor modification.  Continue statin.   Meningioma [facility]) Assessment & Plan: Being followed by neurology.  Status post gamma knife treatment.  No  pain currently.  Seeing Dr. Vear.  Currently on Lamictal .   Stage 3b chronic kidney disease (HCC) Assessment & Plan: Off meloxicam .   Seeing nephrology.  Continue to avoid antiinflammatories. Stay hydrated.  Continue losartan . Discussed the possibility of adding SGLT2 inhibitor. Blood pressure varying with occasional low blood pressures. Discussed the possibility of SGLT 2 inhibitor lowering pressure.  Follow.  Due to see Dr Gollan 09/22/23.    Other orders -     Tirzepatide ; Inject 7.5 mg into the skin once a week.  Dispense: 6 mL; Refill: 1     Allena Hamilton, MD

## 2023-09-13 ENCOUNTER — Encounter: Payer: Self-pay | Admitting: Internal Medicine

## 2023-09-13 NOTE — Assessment & Plan Note (Signed)
 Discussed diabetes.  Low carb diet and exercise.  On mounjaro . States she is tolerating. Has lost weight. Decrease in appetite. Discussed adding SGLT 2 inhibitor for CKD. Concern regarding fluctuating blood pressures now. Sees Dr Gollan 09/22/23. Will get his input as well.  Follow met b and A1c.

## 2023-09-13 NOTE — Assessment & Plan Note (Addendum)
 Being followed by neurology.  Status post gamma knife treatment.  No pain currently.  Seeing Dr. Epimenio Foot.  Currently on Lamictal.

## 2023-09-13 NOTE — Assessment & Plan Note (Signed)
Continue Lipitor.  Low-cholesterol diet and exercise.  Follow lipid panel liver function test.

## 2023-09-13 NOTE — Assessment & Plan Note (Signed)
Status post angioplasty.  On Eliquis.  Continue risk factor modification.  Continue statin.

## 2023-09-13 NOTE — Assessment & Plan Note (Signed)
 Off meloxicam .   Seeing nephrology.  Continue to avoid antiinflammatories. Stay hydrated.  Continue losartan . Discussed the possibility of adding SGLT2 inhibitor. Blood pressure varying with occasional low blood pressures. Discussed the possibility of SGLT 2 inhibitor lowering pressure.  Follow.  Due to see Dr Gollan 09/22/23.

## 2023-09-13 NOTE — Assessment & Plan Note (Signed)
 Currently on losartan , atenolol  and diltiazem . Blood pressures varying as outlined.  Discussed adding SGLT2 inhibitor - given CKD. Discussed may affect blood pressure. Concern regarding adding with current fluctuations. Due to see Dr Gollan 09/22/23. Would like to get his input regarding blood pressures and treatment.

## 2023-09-13 NOTE — Assessment & Plan Note (Signed)
 Low-carb diet and exercise.  Follow met b and A1c.

## 2023-09-13 NOTE — Assessment & Plan Note (Signed)
No pain.  Off gabapentin.  On lamictal. Follow.

## 2023-09-13 NOTE — Assessment & Plan Note (Signed)
 Saw Dr Gollan 10/13/22 - f/u afib. Was in afib. HR 115. Amlodipine  was stopped and she was started on diltiazem  120mg . Losartan  was decreased to 50mg  and continued on atenolol .Saw Dr Cindie 05/01/23 - recommended atenolol  50mg  bid. Has done relatively well recently. Continue current medication at this time. No changes. Keep f/u appt with cardiology 09/20/23.

## 2023-09-21 NOTE — Progress Notes (Unsigned)
Cardiology Office Note  Date:  09/22/2023   ID:  Kristin James, DOB 03/16/1956, MRN [id number]  PCP:  Dale Bullhead, MD   Chief Complaint  Patient presents with   Hypertension    HPI:  Kristin James is a pleasant 68 year old woman with history of  Permanent atrial fibrillation, asymptomatic HTN,  chronic back pain,  spinal stenosis,  obesity,   Trigeminal neuralgia hospital in September 2015 for acute left lower extremity arterial ischemia secondary to thrombus.  She presents for routine followup of her permanent atrial fibrillation  Last seen by myself in clinic 2/24 Seen by EP September 2024 Rate control strategy recommended  In follow-up today reports that she feels well, asymptomatic from her atrial fibrillation Active around house, likes to spend time outside, walks her dog No gym activity  Over the past year, Lost weight, tolerating mounjaro Weight down 213 to 165  Blood pressure and heart rate running high on home monitoring No chest pain or shortness of breath on exertion Asymptomatic from her atrial fibrillation  Labs reviewed A1C 6.0 Total cholesterol 150 LDL 85  Previous discussions concerning her atrial fibrillation, declined intervention Blood pressure running low at home typically less than 110 systolic  EKG personally reviewed by myself on todays visit EKG Interpretation Date/Time:  Tuesday September 22 2023 09:21:20 EST Ventricular Rate:  108 PR Interval:    QRS Duration:  82 QT Interval:  296 QTC Calculation: 396 R Axis:   30  Text Interpretation: Atrial fibrillation with rapid ventricular response Septal infarct , age undetermined ST & T wave abnormality, consider inferolateral ischemia When compared with ECG of 29-May-2023 10:08, Atrial fibrillation has replaced Atrial flutter ST no longer depressed in Inferior leads QT has shortened Confirmed by Julien Nordmann [phone]) on 09/22/2023 9:24:36 AM    Other past medical history reviewed  tumor  pressing on her fifth cranial nerve Had gamma knife treatment in the past followed with MRI yearly   Trigeminal neuralgia x 6 months. She has seen several specialists, Including prednisone, Neurontin, trileptal among others  Severe pain left side of face, shocking pain,   back surgery March 2016, lumbar region Prior office visit, EKG documented atrial fibrillation. Started on amiodarone, diltiazem, beta blocker and she converted to normal sinus rhythm. Atenolol dose decreased secondary to bradycardia  admitted to the hospital on May 21 2014 with acute pain in her left leg. She had no distal pulses. Her creatinine was 1.99 which improved to 1.0 with fluids. She underwent angiogram of the left lower extremity with thrombolysis with TPA of the left pill, tibial peroneal trunk, peroneal arteries and distally, thrombectomy of the vessel with restoration of flow. she does not have any sleep apnea, does have occasional snoring   Stress test was done in the hospital for chest pain. This showed no ischemia. Ejection fraction 67% EKG in the hospital showed normal sinus rhythm with rate 74 beats per minute. EKG dated 05/21/2014  PMH:   has a past medical history of Afib (HCC), Cellulitis of right arm, Embolus to lower extremity (HCC), Hiatal hernia, History of echocardiogram, History of stress test, Hypercholesteremia, Hyperglycemia, Hypertension, Left Cerebellopontine Angle Meningioma (HCC), Osteoarthritis, PAF (paroxysmal atrial fibrillation) (HCC), Proteinuria, Shingles, Spinal stenosis at L4-L5 level, Thrombophlebitis of posterior tibial vein (HCC) (06/01/2014), and Trigeminal neuralgia (08/2016).  PSH:    Past Surgical History:  Procedure Laterality Date   AORTOGRAM     BACK SURGERY  11/13/2014   CARDIOVERSION N/A 07/19/2020   Procedure: CARDIOVERSION;  Surgeon:  Antonieta Iba, MD;  Location: [facility] ORS;  Service: Cardiovascular;  Laterality: N/A;   CHOLECYSTECTOMY     COLONOSCOPY WITH  PROPOFOL N/A 08/15/2016   Procedure: COLONOSCOPY WITH PROPOFOL;  Surgeon: Christena Deem, MD;  Location: [facility] ENDOSCOPY;  Service: Endoscopy;  Laterality: N/A;   COLONOSCOPY WITH PROPOFOL N/A 11/07/2022   Procedure: COLONOSCOPY WITH PROPOFOL;  Surgeon: Regis Bill, MD;  Location: [facility] ENDOSCOPY;  Service: Endoscopy;  Laterality: N/A;   KNEE ARTHROSCOPY     LOWER EXTREMITY ANGIOGRAM      Current Outpatient Medications  Medication Sig Dispense Refill   acetaminophen (TYLENOL) 650 MG CR tablet Take by mouth.     atenolol (TENORMIN) 50 MG tablet Take 1 tablet (50 mg total) by mouth 2 (two) times daily. 180 tablet 3   atorvastatin (LIPITOR) 40 MG tablet TAKE 1 TABLET BY MOUTH EVERY DAY 90 tablet 2   ELIQUIS 5 MG TABS tablet TAKE 1 TABLET(5 MG) BY MOUTH TWICE DAILY 180 tablet 1   lamoTRIgine (LAMICTAL) 100 MG tablet TAKE 1 TABLET(100 MG) BY MOUTH TWICE DAILY 180 tablet 3   losartan (COZAAR) 100 MG tablet Take 50 mg by mouth 2 (two) times daily.     tirzepatide (MOUNJARO) 7.5 MG/0.5ML Pen Inject 7.5 mg into the skin once a week. 6 mL 1   diltiazem (CARDIZEM CD) 240 MG 24 hr capsule Take 1 capsule (240 mg total) by mouth daily. 90 capsule 3   No current facility-administered medications for this visit.    Allergies:   Patient has no known allergies.   Social History:  The patient  reports that she has never smoked. She has never used smokeless tobacco. She reports current alcohol use. She reports that she does not use drugs.   Family History:   family history includes Breast cancer in her paternal grandmother; Dementia in her mother; Hypertension in her father and mother; Stroke in her father.    Review of Systems: Review of Systems  Constitutional: Negative.   HENT: Negative.    Respiratory: Negative.    Cardiovascular: Negative.   Gastrointestinal: Negative.   Musculoskeletal: Negative.   Neurological: Negative.        Balance issues  Psychiatric/Behavioral: Negative.     All other systems reviewed and are negative.   PHYSICAL EXAM: VS:  BP (!) 160/100 (BP Location: Left Arm, Patient Position: Sitting)   Pulse (!) 108   Ht 5' (1.524 m)   Wt 168 lb 4 oz (76.3 kg)   SpO2 98%   BMI 32.86 kg/m  , BMI Body mass index is 32.86 kg/m. Constitutional:  oriented to person, place, and time. No distress.  HENT:  Head: Grossly normal Eyes:  no discharge. No scleral icterus.  Neck: No JVD, no carotid bruits  Cardiovascular: Regular rate and rhythm, no murmurs appreciated Pulmonary/Chest: Clear to auscultation bilaterally, no wheezes or rails Abdominal: Soft.  no distension.  no tenderness.  Musculoskeletal: Normal range of motion Neurological:  normal muscle tone. Coordination normal. No atrophy Skin: Skin warm and dry Psychiatric: normal affect, pleasant  Recent Labs: 03/09/2023: TSH 2.95 09/11/2023: ALT 22; BUN 25; Creatinine, Ser 1.06; Hemoglobin 16.8; Platelets 222.0; Potassium 4.2; Sodium 141   Lipid Panel Lab Results  Component Value Date   CHOL 150 09/11/2023   HDL 47.50 09/11/2023   LDLCALC 85 09/11/2023   TRIG 86.0 09/11/2023     Wt Readings from Last 3 Encounters:  09/22/23 168 lb 4 oz (76.3 kg)  09/11/23 166 lb  12.8 oz (75.7 kg)  05/29/23 173 lb 3.2 oz (78.6 kg)     ASSESSMENT AND PLAN:   Essential hypertension on diltiazem ER 120 daily, losartan 50 twice daily and atenolol 50 twice daily  Recommended she increase diltiazem ER up to 240 mg daily Monitor heart rate and blood pressure at home and call us with numbers  Hypercholesterolemia stay on her Lipitor Losing weight with Mounjaro  Morbid obesity (HCC) Recommend she continue her exercise program, calorie restriction Now on Mounjaro, weight down significantly, BMI 32  Chronic low back pain Stable symptoms  Atrial fibrillation with RVR (HCC) -  On Eliquis, beta-blocker, diltiazem up to 240 mg daily Closely monitor heart rate and blood pressure  Trigeminal neuralgia of  left side of face Followed on annual basis with MRI  Chronic renal insufficiency Creatinine 1.0, stable A1c stable 6.0 avoid NSAIDs Will defer use of Jardiance/Farxiga to primary care    Orders Placed This Encounter  Procedures   EKG 12-Lead     Signed, Dossie Arbour, M.D., Ph.D. 09/22/2023  [facility] [place], [place] [phone]

## 2023-09-22 ENCOUNTER — Encounter: Payer: Self-pay | Admitting: Cardiovascular Disease

## 2023-09-22 ENCOUNTER — Ambulatory Visit: Payer: PPO | Attending: Cardiovascular Disease | Admitting: Cardiovascular Disease

## 2023-09-22 VITALS — BP 160/100 | HR 108 | Ht 60.0 in | Wt 168.2 lb

## 2023-09-22 DIAGNOSIS — I4821 Permanent atrial fibrillation: Secondary | ICD-10-CM

## 2023-09-22 DIAGNOSIS — E7800 Pure hypercholesterolemia, unspecified: Secondary | ICD-10-CM

## 2023-09-22 DIAGNOSIS — I10 Essential (primary) hypertension: Secondary | ICD-10-CM | POA: Diagnosis not present

## 2023-09-22 DIAGNOSIS — N183 Chronic kidney disease, stage 3 unspecified: Secondary | ICD-10-CM | POA: Diagnosis not present

## 2023-09-22 DIAGNOSIS — E782 Mixed hyperlipidemia: Secondary | ICD-10-CM

## 2023-09-22 DIAGNOSIS — I739 Peripheral vascular disease, unspecified: Secondary | ICD-10-CM

## 2023-09-22 DIAGNOSIS — D6869 Other thrombophilia: Secondary | ICD-10-CM

## 2023-09-22 MED ORDER — DILTIAZEM HCL ER COATED BEADS 240 MG PO CP24: 240.0000 mg | ORAL_CAPSULE | Freq: Every day | ORAL | 3 refills | Status: AC

## 2023-09-22 NOTE — Patient Instructions (Addendum)
Medication Instructions:  Please increase the diltiazem up to 120 mg twice a day Once you run out of medications, Change to diltiazem ER 240 mg once a day (call/mychart with updates on HR/BP)  If you need a refill on your cardiac medications before your next appointment, please call your pharmacy.   Lab work: No new labs needed  Testing/Procedures: No new testing needed  Follow-Up: At [facility], you and your health needs are our priority.  As part of our continuing mission to provide you with exceptional heart care, we have created designated Provider Care Teams.  These Care Teams include your primary Cardiologist (physician) and Advanced Practice Providers (APPs -  Physician Assistants and Nurse Practitioners) who all work together to provide you with the care you need, when you need it.  You will need a follow up appointment in 12 months  Providers on your designated Care Team:   Nicolasa Ducking, NP Eula Listen, PA-C Cadence Fransico Michael, [place]  COVID-19 Vaccine Information can be found at: [email or web address] For questions related to vaccine distribution or appointments, please email [email or web address] or call [phone].

## 2023-10-14 ENCOUNTER — Other Ambulatory Visit: Payer: Self-pay | Admitting: Internal Medicine

## 2023-10-14 MED ORDER — TIRZEPATIDE 7.5 MG/0.5ML ~~LOC~~ SOAJ: 7.5000 mg | SUBCUTANEOUS | 1 refills | Status: DC

## 2023-10-14 NOTE — Telephone Encounter (Signed)
Copied from CRM [id number]. Topic: Clinical - Medication Refill >> Oct 14, 2023  8:52 AM Deaijah H wrote: Most Recent Primary Care Visit:  Provider: Dale Neshkoro  Department: [facility]  Visit Type: OFFICE VISIT  Date: 09/11/2023  Medication: tirzepatide [facility]) 7.5 MG/0.5ML Pen  Has the patient contacted their pharmacy? No (Agent: If no, request that the patient contact the pharmacy for the refill. If patient does not wish to contact the pharmacy document the reason why and proceed with request.) Due to Dr. Lorin Picket giving one month fill  (Agent: If yes, when and what did the pharmacy advise?)  Is this the correct pharmacy for this prescription? Yes If no, delete pharmacy and type the correct one.  This is the patient's preferred pharmacy:  [id number] - [place], [place] - [address] AT [facility] [address] [facility] [address] [place] [address] Phone: [phone] Fax: [phone]   Has the prescription been filled recently? Yes, month ago   Is the patient out of the medication? Yes  Has the patient been seen for an appointment in the last year OR does the patient have an upcoming appointment? Yes  Can we respond through MyChart? Yes  Agent: Please be advised that Rx refills may take up to 3 business days. We ask that you follow-up with your pharmacy.

## 2023-10-16 ENCOUNTER — Telehealth: Payer: Self-pay | Admitting: Pharmacist

## 2023-10-16 NOTE — Telephone Encounter (Signed)
Pharmacy Patient Advocate Encounter  Received notification from [facility] [employer] that Prior Authorization for [facility] 7.5MG /0.5ML auto-injectors has been APPROVED from 10/16/2023 to 10/15/2024   PA #/Case ID/Reference #: [id number]

## 2023-10-17 ENCOUNTER — Other Ambulatory Visit: Payer: Self-pay | Admitting: Internal Medicine

## 2023-11-11 ENCOUNTER — Other Ambulatory Visit: Payer: Self-pay | Admitting: Cardiovascular Disease

## 2023-11-17 ENCOUNTER — Telehealth: Payer: Self-pay | Admitting: Cardiovascular Disease

## 2023-11-17 ENCOUNTER — Encounter: Payer: Self-pay | Admitting: Pharmacist

## 2023-11-17 NOTE — Progress Notes (Signed)
 Pharmacy Quality Measure Review  This patient is appearing on a report for being at risk of failing the adherence measure for diabetes medications this calendar year.   Medication: Mounjaro 7.5 mg Last fill date: 10/19/23 for 28 day supply  Insurance report was not up to date. No action needed at this time.  Medication refilled on 11/16/23 for 28 day supply

## 2023-11-17 NOTE — Telephone Encounter (Signed)
 Called and spoke with patient. Informed patient that per medication list and most recent office visit she should be taking Atenolol 50 MG twice daily. Patient reports that is what she has been taking but the pharmacy filled her Atenolol 25 MG daily. Patient states that she will contact the pharmacy.

## 2023-11-17 NOTE — Telephone Encounter (Signed)
 Pt c/o medication issue:  1. Name of Medication: Atenolol  2. How are you currently taking this medication (dosage and times per day)?   3. Are you having a reaction (difficulty breathing--STAT)?   4. What is your medication issue? She needs the correct dose that she is supposed to be taking

## 2023-12-21 ENCOUNTER — Other Ambulatory Visit (HOSPITAL_COMMUNITY): Payer: Self-pay

## 2023-12-22 DIAGNOSIS — Z01818 Encounter for other preprocedural examination: Secondary | ICD-10-CM | POA: Diagnosis not present

## 2023-12-22 DIAGNOSIS — Z860101 Personal history of adenomatous and serrated colon polyps: Secondary | ICD-10-CM | POA: Diagnosis not present

## 2023-12-24 ENCOUNTER — Telehealth: Payer: Self-pay | Admitting: Cardiovascular Disease

## 2023-12-24 NOTE — Telephone Encounter (Signed)
   Pre-operative Risk Assessment    Patient Name: Kristin James  DOB: 27-Dec-1955 MRN: [id number]   Date of last office visit: unknown Date of next office visit: unknown   Request for Surgical Clearance    Procedure:   colonoscopy  Date of Surgery:  Clearance 02/26/24                                Surgeon:  Dr. Leida Puna Surgeon's Group or Practice Name:  [facility] Phone number:  [phone] Fax number:  [phone]   Type of Clearance Requested:   - Pharmacy:  Hold Apixaban  (Eliquis ) follow insdtructions   Type of Anesthesia:  Not Indicated   Additional requests/questions:    [name]   12/24/2023, 11:12 AM

## 2023-12-25 NOTE — Telephone Encounter (Signed)
 See recommendation by her clinical pharmacist regarding pharmacy clearance prior to colonoscopy.

## 2023-12-25 NOTE — Telephone Encounter (Signed)
 Patient with diagnosis of afib on Eliquis  for anticoagulation.    Procedure: colonoscopy  Date of procedure: 02/26/24   CHA2DS2-VASc Score = 5   This indicates a 7.2% annual risk of stroke. The patient's score is based upon: CHF History: 0 HTN History: 1 Diabetes History: 1 Stroke History: 0 Vascular Disease History: 1 Age Score: 1 Gender Score: 1     CrCl 46 mL/min Platelet count 222 K  Patient has not  had an Afib/aflutter ablation within the last 3 months or DCCV within the last 30 days    Per office protocol, patient can hold Eliquis  for 1-2 days prior to procedure.     **This guidance is not considered finalized until pre-operative APP has relayed final recommendations.**

## 2023-12-25 NOTE — Telephone Encounter (Signed)
 Left a detailed message with pharmacy recommendations of Eliquis  and advised a call back with any questions or concerns.   Copy of pharmacy recommendations send to requesting provider.

## 2024-01-11 ENCOUNTER — Ambulatory Visit: Payer: PPO | Admitting: Internal Medicine

## 2024-01-11 ENCOUNTER — Encounter: Payer: Self-pay | Admitting: Internal Medicine

## 2024-01-11 VITALS — BP 128/74 | HR 74 | Temp 98.0°F | Resp 16 | Ht 60.0 in | Wt 168.0 lb

## 2024-01-11 DIAGNOSIS — I480 Paroxysmal atrial fibrillation: Secondary | ICD-10-CM

## 2024-01-11 DIAGNOSIS — I10 Essential (primary) hypertension: Secondary | ICD-10-CM

## 2024-01-11 DIAGNOSIS — E7800 Pure hypercholesterolemia, unspecified: Secondary | ICD-10-CM | POA: Diagnosis not present

## 2024-01-11 DIAGNOSIS — E1165 Type 2 diabetes mellitus with hyperglycemia: Secondary | ICD-10-CM | POA: Diagnosis not present

## 2024-01-11 DIAGNOSIS — Z7985 Long-term (current) use of injectable non-insulin antidiabetic drugs: Secondary | ICD-10-CM

## 2024-01-11 DIAGNOSIS — N1832 Chronic kidney disease, stage 3b: Secondary | ICD-10-CM | POA: Diagnosis not present

## 2024-01-11 DIAGNOSIS — D582 Other hemoglobinopathies: Secondary | ICD-10-CM

## 2024-01-11 DIAGNOSIS — I739 Peripheral vascular disease, unspecified: Secondary | ICD-10-CM

## 2024-01-11 DIAGNOSIS — D329 Benign neoplasm of meninges, unspecified: Secondary | ICD-10-CM | POA: Diagnosis not present

## 2024-01-11 LAB — LIPID PANEL
Cholesterol: 150 mg/dL (ref 0–200)
HDL: 46.6 mg/dL (ref >39.00)
LDL Cholesterol: 84 mg/dL (ref 0–99)
NonHDL: 103.4
Total CHOL/HDL Ratio: 3
Triglycerides: 95 mg/dL (ref 0.0–149.0)
VLDL: 19 mg/dL (ref 0.0–40.0)

## 2024-01-11 LAB — HEPATIC FUNCTION PANEL
ALT: 24 U/L (ref 0–35)
AST: 23 U/L (ref 0–37)
Albumin: 4.2 g/dL (ref 3.5–5.2)
Alkaline Phosphatase: 132 U/L — ABNORMAL HIGH (ref 39–117)
Bilirubin, Direct: 0.2 mg/dL (ref 0.0–0.3)
Total Bilirubin: 1.1 mg/dL (ref 0.2–1.2)
Total Protein: 7.2 g/dL (ref 6.0–8.3)

## 2024-01-11 LAB — CBC WITH DIFFERENTIAL/PLATELET
Basophils Absolute: 0.1 10*3/uL (ref 0.0–0.1)
Basophils Relative: 1.1 % (ref 0.0–3.0)
Eosinophils Absolute: 0.2 10*3/uL (ref 0.0–0.7)
Eosinophils Relative: 2.3 % (ref 0.0–5.0)
HCT: 50.3 % — ABNORMAL HIGH (ref 36.0–46.0)
Hemoglobin: 16.7 g/dL — ABNORMAL HIGH (ref 12.0–15.0)
Lymphocytes Relative: 23.3 % (ref 12.0–46.0)
Lymphs Abs: 1.8 10*3/uL (ref 0.7–4.0)
MCHC: 33.2 g/dL (ref 30.0–36.0)
MCV: 93.6 fl (ref 78.0–100.0)
Monocytes Absolute: 0.6 10*3/uL (ref 0.1–1.0)
Monocytes Relative: 7.5 % (ref 3.0–12.0)
Neutro Abs: 5.2 10*3/uL (ref 1.4–7.7)
Neutrophils Relative %: 65.8 % (ref 43.0–77.0)
Platelets: 209 10*3/uL (ref 150.0–400.0)
RBC: 5.37 Mil/uL — ABNORMAL HIGH (ref 3.87–5.11)
RDW: 13.1 % (ref 11.5–15.5)
WBC: 7.9 10*3/uL (ref 4.0–10.5)

## 2024-01-11 LAB — BASIC METABOLIC PANEL WITH GFR
BUN: 24 mg/dL — ABNORMAL HIGH (ref 6–23)
CO2: 27 meq/L (ref 19–32)
Calcium: 9.5 mg/dL (ref 8.4–10.5)
Chloride: 105 meq/L (ref 96–112)
Creatinine, Ser: 1.18 mg/dL (ref 0.40–1.20)
GFR: 47.46 mL/min — ABNORMAL LOW (ref >60.00)
Glucose, Bld: 98 mg/dL (ref 70–99)
Potassium: 5 meq/L (ref 3.5–5.1)
Sodium: 140 meq/L (ref 135–145)

## 2024-01-11 LAB — TSH: TSH: 2.99 u[IU]/mL (ref 0.35–5.50)

## 2024-01-11 LAB — HEMOGLOBIN A1C: Hgb A1c MFr Bld: 5.7 % (ref 4.6–6.5)

## 2024-01-11 MED ORDER — TIRZEPATIDE 10 MG/0.5ML ~~LOC~~ SOAJ: 10.0000 mg | SUBCUTANEOUS | 2 refills | Status: DC

## 2024-01-11 NOTE — Assessment & Plan Note (Signed)
 Continue lipitor. Low cholesterol diet and exercise. Follow lipid panel. Check with today's labs.

## 2024-01-11 NOTE — Assessment & Plan Note (Signed)
 Blood pressure as outlined. Continue diltiazem , atenolol  and losartan . Follow.  No changes in medication today.

## 2024-01-11 NOTE — Assessment & Plan Note (Signed)
Recheck cbc today to confirm stable.  

## 2024-01-11 NOTE — Assessment & Plan Note (Signed)
 Has been followed by neurology. S/p gamma knife treatment. No pain currently. Seeing Dr Godwin Lat. Continues on lamictal . Planning f/u MRI and f/u with neurology 02/04/24.

## 2024-01-11 NOTE — Progress Notes (Signed)
 Subjective:    Patient ID: Kristin James, female    DOB: 1956-05-11, 68 y.o.   MRN: [id number]  Patient here for  Chief Complaint  Patient presents with   Medical Management of Chronic Issues    HPI Here for a scheduled follow up - follow up regarding hypercholesterolemia, hypertension and diabetes. Currently on mounjaro .  Had f/u with cardiology 09/22/23. Recommended increase diltiazem  ER to 240mg  q day. Continue losartan  50mg  bid and atenolol  50mg  bid. Denies any increased heart rate or palpitations. No chest pain. Occasionally her blood pressure will drop and she can feel this, but overall she feels she is doing ok. Reviewed outside blood pressures. Overall blood pressures averaging 120-130/70-80s. Discussed the need to stay hydrated. Saw GI 12/22/23 - planning for colonoscopy 02/26/24. Per cardiology, can hold eliquis  1-2 days prior to procedure.  No nausea. No abdominal pain or constipation. She is tolerating mounjaro  7.5mg . wants to increase dose to 10mg . Discussed possible side effects - monitoring for side effects. No headache. Has a new puppy. Trying to stay active.    Past Medical History:  Diagnosis Date   Afib (HCC)    Cellulitis of right arm    Embolus to lower extremity (HCC)    a. 05/2014 s/p thrombolysis of LLE.   Hiatal hernia    History of echocardiogram    a. 07/2020 Echo: EF 55-60%, no rwma, Nl RV size/fxn. Mildly dil LA. Mild MR/MS. Sev mitral annular Ca2+. Mild Ao sclerosis w/o stenosis.   History of stress test    a. 05/2014 lexiscan MV: EF 67%, no ischemia/infarct.   Hypercholesteremia    Hyperglycemia    Hypertension    Left Cerebellopontine Angle Meningioma (HCC)    a. 10/2016 s/p Gamma knife.   Osteoarthritis    PAF (paroxysmal atrial fibrillation) (HCC)    a. Dx 10/2014->converted on amio. CHA2DS2VASc = 3-4-->Eliquis .   Proteinuria    Shingles    dec 2021   Spinal stenosis at L4-L5 level    Thrombophlebitis of posterior tibial vein (HCC) 06/01/2014    Trigeminal neuralgia 08/2016   Past Surgical History:  Procedure Laterality Date   AORTOGRAM     BACK SURGERY  11/13/2014   CARDIOVERSION N/A 07/19/2020   Procedure: CARDIOVERSION;  Surgeon: Devorah Fonder, MD;  Location: [facility] ORS;  Service: Cardiovascular;  Laterality: N/A;   CHOLECYSTECTOMY     COLONOSCOPY WITH PROPOFOL  N/A 08/15/2016   Procedure: COLONOSCOPY WITH PROPOFOL ;  Surgeon: Deveron Fly, MD;  Location: [facility] ENDOSCOPY;  Service: Endoscopy;  Laterality: N/A;   COLONOSCOPY WITH PROPOFOL  N/A 11/07/2022   Procedure: COLONOSCOPY WITH PROPOFOL ;  Surgeon: Shane Darling, MD;  Location: [facility] ENDOSCOPY;  Service: Endoscopy;  Laterality: N/A;   KNEE ARTHROSCOPY     LOWER EXTREMITY ANGIOGRAM     Family History  Problem Relation Age of Onset   Hypertension Mother    Dementia Mother    Stroke Father        3 strokes   Hypertension Father    Breast cancer Paternal Grandmother    Colon cancer Neg Hx    Social History   Socioeconomic History   Marital status: Married    Spouse name: Not on file   Number of children: 2   Years of education: Not on file   Highest education level: Not on file  Occupational History   Not on file  Tobacco Use   Smoking status: Never   Smokeless tobacco: Never  Vaping Use  Vaping status: Never Used  Substance and Sexual Activity   Alcohol use: Yes    Alcohol/week: 0.0 standard drinks of alcohol    Comment: occ. wine   Drug use: No   Sexual activity: Not on file  Other Topics Concern   Not on file  Social History Narrative   Right handed    Caffeine use: coffee (1/2 cup per day)   Social Drivers of Health   Financial Resource Strain: Not on file  Food Insecurity: Not on file  Transportation Needs: Not on file  Physical Activity: Unknown (09/07/2023)   Exercise Vital Sign    Days of Exercise per Week: 2 days    Minutes of Exercise per Session: Not on file  Stress: No Stress Concern Present (09/07/2023)   [employer] of  Occupational Health - Occupational Stress Questionnaire    Feeling of Stress : Only a little  Social Connections: Unknown (09/07/2023)   Social Connection and Isolation Panel [NHANES]    Frequency of Communication with Friends and Family: Not on file    Frequency of Social Gatherings with Friends and Family: Not on file    Attends Religious Services: Not on file    Active Member of Clubs or Organizations: Yes    Attends Banker Meetings: Not on file    Marital Status: Not on file     Review of Systems  Constitutional:  Negative for appetite change and unexpected weight change.  HENT:  Negative for congestion and sinus pressure.   Respiratory:  Negative for cough, chest tightness and shortness of breath.   Cardiovascular:  Negative for chest pain, palpitations and leg swelling.  Gastrointestinal:  Negative for abdominal pain, diarrhea, nausea and vomiting.  Genitourinary:  Negative for difficulty urinating and dysuria.  Musculoskeletal:  Negative for joint swelling and myalgias.  Skin:  Negative for color change and rash.  Neurological:  Negative for dizziness and headaches.  Psychiatric/Behavioral:  Negative for agitation and dysphoric mood.        Objective:     BP 128/74   Pulse 74   Temp 98 F (36.7 C)   Resp 16   Ht 5' (1.524 m)   Wt 168 lb (76.2 kg)   SpO2 98%   BMI 32.81 kg/m  Wt Readings from Last 3 Encounters:  01/11/24 168 lb (76.2 kg)  09/22/23 168 lb 4 oz (76.3 kg)  09/11/23 166 lb 12.8 oz (75.7 kg)    Physical Exam Vitals reviewed.  Constitutional:      General: She is not in acute distress.    Appearance: Normal appearance.  HENT:     Head: Normocephalic and atraumatic.     Right Ear: External ear normal.     Left Ear: External ear normal.     Mouth/Throat:     Pharynx: No oropharyngeal exudate or posterior oropharyngeal erythema.  Eyes:     General: No scleral icterus.       Right eye: No discharge.        Left eye: No discharge.      Conjunctiva/sclera: Conjunctivae normal.  Neck:     Thyroid : No thyromegaly.  Cardiovascular:     Comments: Irregularly irregular.  Pulmonary:     Effort: No respiratory distress.     Breath sounds: Normal breath sounds. No wheezing.  Abdominal:     General: Bowel sounds are normal.     Palpations: Abdomen is soft.     Tenderness: There is no abdominal tenderness.  Musculoskeletal:  General: No swelling or tenderness.     Cervical back: Neck supple. No tenderness.  Lymphadenopathy:     Cervical: No cervical adenopathy.  Skin:    Findings: No erythema or rash.  Neurological:     Mental Status: She is alert.  Psychiatric:        Mood and Affect: Mood normal.        Behavior: Behavior normal.         Outpatient Encounter Medications as of 01/11/2024  Medication Sig   tirzepatide  (MOUNJARO ) 10 MG/0.5ML Pen Inject 10 mg into the skin once a week.   acetaminophen  (TYLENOL ) 650 MG CR tablet Take by mouth.   atenolol  (TENORMIN ) 50 MG tablet Take 1 tablet (50 mg total) by mouth 2 (two) times daily.   atorvastatin  (LIPITOR) 40 MG tablet TAKE 1 TABLET BY MOUTH EVERY DAY   diltiazem  (CARDIZEM  CD) 240 MG 24 hr capsule Take 1 capsule (240 mg total) by mouth daily.   ELIQUIS  5 MG TABS tablet TAKE 1 TABLET(5 MG) BY MOUTH TWICE DAILY   lamoTRIgine  (LAMICTAL ) 100 MG tablet TAKE 1 TABLET(100 MG) BY MOUTH TWICE DAILY   losartan  (COZAAR ) 100 MG tablet Take 50 mg by mouth 2 (two) times daily.   [DISCONTINUED] tirzepatide  (MOUNJARO ) 7.5 MG/0.5ML Pen Inject 7.5 mg into the skin once a week.   No facility-administered encounter medications on file as of 01/11/2024.     Lab Results  Component Value Date   WBC 8.5 09/11/2023   HGB 16.8 (H) 09/11/2023   HCT 50.1 (H) 09/11/2023   PLT 222.0 09/11/2023   GLUCOSE 92 09/11/2023   CHOL 150 09/11/2023   TRIG 86.0 09/11/2023   HDL 47.50 09/11/2023   LDLCALC 85 09/11/2023   ALT 22 09/11/2023   AST 24 09/11/2023   NA 141 09/11/2023   K 4.2  09/11/2023   CL 104 09/11/2023   CREATININE 1.06 09/11/2023   BUN 25 (H) 09/11/2023   CO2 28 09/11/2023   TSH 2.95 03/09/2023   INR 1.5 (H) 06/03/2020   HGBA1C 6.0 09/11/2023   MICROALBUR 137.0 (H) 03/09/2023    MM 3D SCREENING MAMMOGRAM BILATERAL BREAST Result Date: 08/21/2023 CLINICAL DATA:  Screening. EXAM: DIGITAL SCREENING BILATERAL MAMMOGRAM WITH TOMOSYNTHESIS AND CAD TECHNIQUE: Bilateral screening digital craniocaudal and mediolateral oblique mammograms were obtained. Bilateral screening digital breast tomosynthesis was performed. The images were evaluated with computer-aided detection. COMPARISON:  Previous exam(s). ACR Breast Density Category b: There are scattered areas of fibroglandular density. FINDINGS: There are no findings suspicious for malignancy. IMPRESSION: No mammographic evidence of malignancy. A result letter of this screening mammogram will be mailed directly to the patient. RECOMMENDATION: Screening mammogram in one year. (Code:SM-B-01Y) BI-RADS CATEGORY  1: Negative. Electronically Signed   By: Alinda Apley M.D.   On: 08/21/2023 12:18       Assessment & Plan:  Type 2 diabetes mellitus with hyperglycemia, without long-term current use of insulin (HCC) Assessment & Plan: Low carb diet and exercise. Has been tolerating mounjaro  7.5mg . wants to increase to 10mg . Discussed possible side effects of medication. Follow sugars. Check met b and A1c today.   Orders: -     Basic metabolic panel with GFR -     Hemoglobin A1c  Stage 3b chronic kidney disease (HCC) Assessment & Plan: Off meloxicam .   Seeing nephrology.  Continue to avoid antiinflammatories. Stay hydrated.  Continue losartan . Discussed the possibility of adding SGLT2 inhibitor. Blood pressure varying with occasional low blood pressures. Have discussed the  possibility of SGLT 2 inhibitor lowering pressure.  Discussed medication change today. She request to increase mounjaro  to 10mg . Will hold on adding SGLT2  at this time. Follow    Elevated hemoglobin (HCC) Assessment & Plan: Recheck cbc today to confirm stable.   Orders: -     CBC with Differential/Platelet  Hypercholesterolemia Assessment & Plan: Continue lipitor. Low cholesterol diet and exercise. Follow lipid panel. Check with today's labs.   Orders: -     Hepatic function panel -     TSH -     Lipid panel  Primary hypertension Assessment & Plan: Blood pressure as outlined. Continue diltiazem , atenolol  and losartan . Follow.  No changes in medication today.    Paroxysmal atrial fibrillation [facility]) Assessment & Plan: Followed by cardiology.  Currently on diltiazem  240mg  q day and atenolol . Continues eliquis . No changes today. Follow.    Meningioma [facility]) Assessment & Plan:  Has been followed by neurology. S/p gamma knife treatment. No pain currently. Seeing Dr Godwin Lat. Continues on lamictal . Planning f/u MRI and f/u with neurology 02/04/24.    PAD (peripheral artery disease) (HCC) Assessment & Plan: Status post angioplasty.  On Eliquis .  Continue risk factor modification.  Continue statin. Stable.    Other orders -     Tirzepatide ; Inject 10 mg into the skin once a week.  Dispense: 6 mL; Refill: 2     Dellar Fenton, MD

## 2024-01-11 NOTE — Assessment & Plan Note (Signed)
 Off meloxicam .   Seeing nephrology.  Continue to avoid antiinflammatories. Stay hydrated.  Continue losartan . Discussed the possibility of adding SGLT2 inhibitor. Blood pressure varying with occasional low blood pressures. Have discussed the possibility of SGLT 2 inhibitor lowering pressure.  Discussed medication change today. She request to increase mounjaro  to 10mg . Will hold on adding SGLT2 at this time. Follow

## 2024-01-11 NOTE — Assessment & Plan Note (Signed)
 Status post angioplasty.  On Eliquis .  Continue risk factor modification.  Continue statin. Stable.

## 2024-01-11 NOTE — Assessment & Plan Note (Signed)
 Low carb diet and exercise. Has been tolerating mounjaro  7.5mg . wants to increase to 10mg . Discussed possible side effects of medication. Follow sugars. Check met b and A1c today.

## 2024-01-11 NOTE — Assessment & Plan Note (Signed)
 Followed by cardiology.  Currently on diltiazem  240mg  q day and atenolol . Continues eliquis . No changes today. Follow.

## 2024-01-26 ENCOUNTER — Encounter: Payer: Self-pay | Admitting: Pharmacist

## 2024-01-26 NOTE — Progress Notes (Signed)
 Pharmacy Quality Measure Review  This patient is appearing on a report for being at risk of failing the adherence measure for diabetes medications this calendar year.   Medication: Mounjaro  7.5 mg Last fill date: 12/16/23 for 28 day supply  Insurance report was not up to date. No action needed at this time.  Medication dose was increased to 10 mg.  Medication has been filled (10 mg) as of 01/12/24 for 28 day supply. 2 additional refills remaining.

## 2024-01-31 ENCOUNTER — Ambulatory Visit: Payer: Self-pay | Admitting: Internal Medicine

## 2024-02-02 ENCOUNTER — Telehealth: Payer: Self-pay

## 2024-02-02 NOTE — Telephone Encounter (Signed)
 Copied from CRM [id number]. Topic: General - Other >> Feb 02, 2024  3:28 PM Dorisann Garre T wrote: Reason for CRM: patient is needing a call back from trica from the office there was not a message left on patient chart for me to relay to her

## 2024-02-03 NOTE — Telephone Encounter (Signed)
 LM for patient to call back. Please schedule an appointment with Dr Geralyn Knee to discussed elevated hemoglobin when she returns call.

## 2024-02-04 DIAGNOSIS — G9389 Other specified disorders of brain: Secondary | ICD-10-CM | POA: Diagnosis not present

## 2024-02-04 DIAGNOSIS — Z923 Personal history of irradiation: Secondary | ICD-10-CM | POA: Diagnosis not present

## 2024-02-04 DIAGNOSIS — I6389 Other cerebral infarction: Secondary | ICD-10-CM | POA: Diagnosis not present

## 2024-02-04 DIAGNOSIS — G939 Disorder of brain, unspecified: Secondary | ICD-10-CM | POA: Diagnosis not present

## 2024-02-04 DIAGNOSIS — D329 Benign neoplasm of meninges, unspecified: Secondary | ICD-10-CM | POA: Diagnosis not present

## 2024-02-09 ENCOUNTER — Encounter: Payer: Self-pay | Admitting: *Deleted

## 2024-02-09 ENCOUNTER — Other Ambulatory Visit: Payer: Self-pay | Admitting: Cardiovascular Disease

## 2024-02-09 DIAGNOSIS — I480 Paroxysmal atrial fibrillation: Secondary | ICD-10-CM

## 2024-02-09 NOTE — Telephone Encounter (Signed)
 Prescription refill request for Eliquis  received. Indication:afib Last office visit:1/25 Scr:1.18  5/25 Age: 68 Weight:76.2  kg  Prescription refilled

## 2024-02-09 NOTE — Telephone Encounter (Signed)
 LM for patient to call back x3 attempts. Please schedule an appointment with Dr Geralyn Knee to discussed elevated hemoglobin when she returns call.

## 2024-02-26 ENCOUNTER — Ambulatory Visit
Admission: RE | Admit: 2024-02-26 | Discharge: 2024-02-26 | Disposition: A | Discharge status: Home / Self Care | Attending: Gastroenterology | Admitting: Gastroenterology

## 2024-02-26 ENCOUNTER — Other Ambulatory Visit: Payer: Self-pay

## 2024-02-26 ENCOUNTER — Encounter
Admission: RE | Disposition: A | Discharge status: Home / Self Care | Payer: Self-pay | Source: Home / Self Care | Attending: Gastroenterology

## 2024-02-26 ENCOUNTER — Ambulatory Visit: Admitting: Anesthesiology

## 2024-02-26 DIAGNOSIS — K649 Unspecified hemorrhoids: Secondary | ICD-10-CM | POA: Diagnosis not present

## 2024-02-26 DIAGNOSIS — I480 Paroxysmal atrial fibrillation: Secondary | ICD-10-CM | POA: Insufficient documentation

## 2024-02-26 DIAGNOSIS — Z1211 Encounter for screening for malignant neoplasm of colon: Secondary | ICD-10-CM | POA: Insufficient documentation

## 2024-02-26 DIAGNOSIS — M1990 Unspecified osteoarthritis, unspecified site: Secondary | ICD-10-CM | POA: Insufficient documentation

## 2024-02-26 DIAGNOSIS — N189 Chronic kidney disease, unspecified: Secondary | ICD-10-CM | POA: Insufficient documentation

## 2024-02-26 DIAGNOSIS — Z09 Encounter for follow-up examination after completed treatment for conditions other than malignant neoplasm: Secondary | ICD-10-CM | POA: Diagnosis not present

## 2024-02-26 DIAGNOSIS — E669 Obesity, unspecified: Secondary | ICD-10-CM | POA: Insufficient documentation

## 2024-02-26 DIAGNOSIS — I4891 Unspecified atrial fibrillation: Secondary | ICD-10-CM | POA: Insufficient documentation

## 2024-02-26 DIAGNOSIS — K5730 Diverticulosis of large intestine without perforation or abscess without bleeding: Secondary | ICD-10-CM | POA: Diagnosis not present

## 2024-02-26 DIAGNOSIS — Z7901 Long term (current) use of anticoagulants: Secondary | ICD-10-CM | POA: Diagnosis not present

## 2024-02-26 DIAGNOSIS — K640 First degree hemorrhoids: Secondary | ICD-10-CM | POA: Diagnosis not present

## 2024-02-26 DIAGNOSIS — E1122 Type 2 diabetes mellitus with diabetic chronic kidney disease: Secondary | ICD-10-CM | POA: Insufficient documentation

## 2024-02-26 DIAGNOSIS — Z79899 Other long term (current) drug therapy: Secondary | ICD-10-CM | POA: Insufficient documentation

## 2024-02-26 DIAGNOSIS — I129 Hypertensive chronic kidney disease with stage 1 through stage 4 chronic kidney disease, or unspecified chronic kidney disease: Secondary | ICD-10-CM | POA: Insufficient documentation

## 2024-02-26 DIAGNOSIS — Z860101 Personal history of adenomatous and serrated colon polyps: Secondary | ICD-10-CM | POA: Insufficient documentation

## 2024-02-26 DIAGNOSIS — E7800 Pure hypercholesterolemia, unspecified: Secondary | ICD-10-CM | POA: Insufficient documentation

## 2024-02-26 HISTORY — PX: COLONOSCOPY: SHX5424

## 2024-02-26 SURGERY — COLONOSCOPY
Anesthesia: General

## 2024-02-26 MED ORDER — LIDOCAINE HCL (PF) 2 % IJ SOLN
INTRAMUSCULAR | Status: AC
Start: 2024-02-26 — End: 2024-02-26
  Filled 2024-02-26: qty 5

## 2024-02-26 MED ORDER — LIDOCAINE HCL (CARDIAC) PF 100 MG/5ML IV SOSY
PREFILLED_SYRINGE | INTRAVENOUS | Status: DC | PRN
  Administered 2024-02-26: 60 mg via INTRAVENOUS

## 2024-02-26 MED ORDER — PROPOFOL 10 MG/ML IV BOLUS
INTRAVENOUS | Status: DC | PRN
  Administered 2024-02-26: 30 mg via INTRAVENOUS
  Administered 2024-02-26: 50 mg via INTRAVENOUS

## 2024-02-26 MED ORDER — PROPOFOL 500 MG/50ML IV EMUL
INTRAVENOUS | Status: DC | PRN
  Administered 2024-02-26: 75 ug/kg/min via INTRAVENOUS

## 2024-02-26 MED ORDER — SODIUM CHLORIDE 0.9 % IV SOLN: INTRAVENOUS | Status: DC

## 2024-02-26 MED ORDER — DEXMEDETOMIDINE HCL IN NACL 80 MCG/20ML IV SOLN
INTRAVENOUS | Status: DC | PRN
Start: 2024-02-26 — End: 2024-02-26
  Administered 2024-02-26: 20 ug via INTRAVENOUS

## 2024-02-26 MED ORDER — DEXMEDETOMIDINE HCL IN NACL 80 MCG/20ML IV SOLN
INTRAVENOUS | Status: AC
  Filled 2024-02-26: qty 20

## 2024-02-26 NOTE — Anesthesia Preprocedure Evaluation (Signed)
 Anesthesia Evaluation  Patient identified by MRN, date of birth, ID band Patient awake    Reviewed: Allergy & Precautions, NPO status , Patient's Chart, lab work & pertinent test results  History of Anesthesia Complications Negative for: history of anesthetic complications  Airway Mallampati: II  TM Distance: >3 FB Neck ROM: Full    Dental no notable dental hx. (+) Teeth Intact   Pulmonary neg pulmonary ROS, neg sleep apnea, neg COPD, Patient abstained from smoking.Not current smoker   Pulmonary exam normal breath sounds clear to auscultation       Cardiovascular Exercise Tolerance: Good METShypertension, + Peripheral Vascular Disease  (-) CAD and (-) Past MI + dysrhythmias Atrial Fibrillation  Rhythm:Irregular Rate:Normal - Systolic murmurs    Neuro/Psych negative neurological ROS  negative psych ROS   GI/Hepatic ,neg GERD  ,,(+)     (-) substance abuse    Endo/Other  diabetes  Stopped mounjaro  2 weeks ago. Denies GI symptoms today  Renal/GU CRFRenal disease     Musculoskeletal  (+) Arthritis ,    Abdominal   Peds  Hematology   Anesthesia Other Findings Past Medical History: No date: Afib (HCC) No date: Cellulitis of right arm No date: Embolus to lower extremity (HCC)     Comment:  a. 05/2014 s/p thrombolysis of LLE. No date: Hiatal hernia No date: History of echocardiogram     Comment:  a. 07/2020 Echo: EF 55-60%, no rwma, Nl RV size/fxn.               Mildly dil LA. Mild MR/MS. Sev mitral annular Ca2+. Mild               Ao sclerosis w/o stenosis. No date: History of stress test     Comment:  a. 05/2014 lexiscan MV: EF 67%, no ischemia/infarct. No date: Hypercholesteremia No date: Hyperglycemia No date: Hypertension No date: Left Cerebellopontine Angle Meningioma (HCC)     Comment:  a. 10/2016 s/p Gamma knife. No date: Osteoarthritis No date: PAF (paroxysmal atrial fibrillation) (HCC)     Comment:  a.  Dx 10/2014->converted on amio. CHA2DS2VASc =               3-4-->Eliquis . No date: Proteinuria No date: Shingles     Comment:  dec 2021 No date: Spinal stenosis at L4-L5 level 06/01/2014: Thrombophlebitis of posterior tibial vein (HCC) 08/2016: Trigeminal neuralgia  Reproductive/Obstetrics                             Anesthesia Physical Anesthesia Plan  ASA: 3  Anesthesia Plan: General   Post-op Pain Management: Minimal or no pain anticipated   Induction: Intravenous  PONV Risk Score and Plan: 3 and Propofol  infusion, TIVA and Ondansetron  Airway Management Planned: Nasal Cannula  Additional Equipment: None  Intra-op Plan:   Post-operative Plan:   Informed Consent: I have reviewed the patients History and Physical, chart, labs and discussed the procedure including the risks, benefits and alternatives for the proposed anesthesia with the patient or authorized representative who has indicated his/her understanding and acceptance.     Dental advisory given  Plan Discussed with: CRNA and Surgeon  Anesthesia Plan Comments: (Discussed risks of anesthesia with patient, including possibility of difficulty with spontaneous ventilation under anesthesia necessitating airway intervention, PONV, and rare risks such as cardiac or respiratory or neurological events, and allergic reactions. Discussed the role of CRNA in patient's perioperative care. Patient understands.)  Anesthesia Quick Evaluation

## 2024-02-26 NOTE — Transfer of Care (Signed)
 Immediate Anesthesia Transfer of Care Note  Patient: Kristin James  Procedure(s) Performed: COLONOSCOPY  Patient Location: PACU  Anesthesia Type:General  Level of Consciousness: sedated  Airway & Oxygen Therapy: Patient Spontanous Breathing  Post-op Assessment: Report given to RN and Post -op Vital signs reviewed and stable  Post vital signs: Reviewed and stable  Last Vitals:  Vitals Value Taken Time  BP 84/62 02/26/24 12:55  Temp    Pulse 30 02/26/24 12:56  Resp 20 02/26/24 12:56  SpO2 98 % 02/26/24 12:56  Vitals shown include unfiled device data.  Last Pain: There were no vitals filed for this visit.       Complications: No notable events documented.

## 2024-02-26 NOTE — Interval H&P Note (Signed)
 History and Physical Interval Note:  02/26/2024 12:21 PM  Kristin James  has presented today for surgery, with the diagnosis of Z86.0101 (ICD-10-CM) - History of adenomatous polyp of colon.  The various methods of treatment have been discussed with the patient and family. After consideration of risks, benefits and other options for treatment, the patient has consented to  Procedure(s) with comments: COLONOSCOPY (N/A) - Patient on Eliquis  and MOUNJARO  as a surgical intervention.  The patient's history has been reviewed, patient examined, no change in status, stable for surgery.  I have reviewed the patient's chart and labs.  Questions were answered to the patient's satisfaction.     [name]  Ok to proceed with colonoscopy

## 2024-02-26 NOTE — Anesthesia Postprocedure Evaluation (Signed)
 Anesthesia Post Note  Patient: Kristin James  Procedure(s) Performed: COLONOSCOPY  Patient location during evaluation: Endoscopy Anesthesia Type: General Level of consciousness: awake and alert Pain management: pain level controlled Vital Signs Assessment: post-procedure vital signs reviewed and stable Respiratory status: spontaneous breathing, nonlabored ventilation, respiratory function stable and patient connected to nasal cannula oxygen Cardiovascular status: blood pressure returned to baseline and stable Postop Assessment: no apparent nausea or vomiting Anesthetic complications: no   No notable events documented.   Last Vitals:  Vitals:   02/26/24 1255  BP: (!) 84/62  Pulse: (!) 30  Resp: 19  SpO2: 98%    Last Pain:  Vitals:   02/26/24 1255  PainSc: Asleep                 Rome Ade

## 2024-02-26 NOTE — Op Note (Signed)
 [facility] Patient Name: Kristin James Procedure Date: 02/26/2024 10:52 AM MRN: [id number] Account #: [id number] Date of Birth: 10/23/1955 Admit Type: Outpatient Age: 68 Room: [facility] ENDO ROOM 3 Gender: Female Note Status: Finalized Instrument Name: Peds Colonoscope [id number] Procedure:             Colonoscopy Indications:           Surveillance: History of adenomatous polyps,                         inadequate prep on last exam (<61yr) Providers:             Ole Schick MD, MD Referring MD:          Allena Hamilton, MD (Referring MD) Medicines:             Monitored Anesthesia Care Complications:         No immediate complications. Procedure:             Pre-Anesthesia Assessment:                        - Prior to the procedure, a History and Physical was                         performed, and patient medications and allergies were                         reviewed. The patient is competent. The risks and                         benefits of the procedure and the sedation options and                         risks were discussed with the patient. All questions                         were answered and informed consent was obtained.                         Patient identification and proposed procedure were                         verified by the physician, the nurse, the                         anesthesiologist, the anesthetist and the technician                         in the endoscopy suite. Mental Status Examination:                         alert and oriented. Airway Examination: normal                         oropharyngeal airway and neck mobility. Respiratory                         Examination: clear to auscultation. CV Examination:  normal. Prophylactic Antibiotics: The patient does not                         require prophylactic antibiotics. Prior                         Anticoagulants: The patient has taken Eliquis                           (apixaban ), last dose was 3 days prior to procedure.                         ASA Grade Assessment: III - A patient with severe                         systemic disease. After reviewing the risks and                         benefits, the patient was deemed in satisfactory                         condition to undergo the procedure. The anesthesia                         plan was to use monitored anesthesia care (MAC).                         Immediately prior to administration of medications,                         the patient was re-assessed for adequacy to receive                         sedatives. The heart rate, respiratory rate, oxygen                         saturations, blood pressure, adequacy of pulmonary                         ventilation, and response to care were monitored                         throughout the procedure. The physical status of the                         patient was re-assessed after the procedure.                        After obtaining informed consent, the colonoscope was                         passed under direct vision. Throughout the procedure,                         the patient's blood pressure, pulse, and oxygen                         saturations were monitored continuously. The  Colonoscope was introduced through the anus and                         advanced to the the terminal ileum, with                         identification of the appendiceal orifice and IC                         valve. The colonoscopy was performed without                         difficulty. The patient tolerated the procedure well.                         The quality of the bowel preparation was good. The                         terminal ileum, ileocecal valve, appendiceal orifice,                         and rectum were photographed. Findings:      The perianal and digital rectal examinations were normal.      The terminal ileum  appeared normal.      Multiple small-mouthed diverticula were found in the sigmoid colon.      Internal hemorrhoids were found during retroflexion. The hemorrhoids       were Grade I (internal hemorrhoids that do not prolapse).      The exam was otherwise without abnormality on direct and retroflexion       views. Impression:            - The examined portion of the ileum was normal.                        - Diverticulosis in the sigmoid colon.                        - Internal hemorrhoids.                        - The examination was otherwise normal on direct and                         retroflexion views.                        - No specimens collected. Recommendation:        - Discharge patient to home.                        - Resume previous diet.                        - Continue present medications.                        - Repeat colonoscopy in 10 years for surveillance.                        - Return to referring physician as previously  scheduled. Procedure Code(s):     --- Professional ---                        H9894, Colorectal cancer screening; colonoscopy on                         individual at high risk Diagnosis Code(s):     --- Professional ---                        Z86.010, Personal history of colonic polyps                        K64.0, First degree hemorrhoids                        K57.30, Diverticulosis of large intestine without                         perforation or abscess without bleeding CPT copyright 2022 [employer]. All rights reserved. The codes documented in this report are preliminary and upon coder review may  be revised to meet current compliance requirements. Ole Schick MD, MD 02/26/2024 1:02:21 PM Number of Addenda: 0 Note Initiated On: 02/26/2024 10:52 AM Scope Withdrawal Time: 0 hours 6 minutes 31 seconds  Total Procedure Duration: 0 hours 11 minutes 44 seconds  Estimated Blood Loss:  Estimated  blood loss: none.      [facility]

## 2024-02-26 NOTE — H&P (Signed)
 Outpatient short stay form Pre-procedure 02/26/2024  Kristin ONEIDA Schick, MD  Primary Physician: Glendia Shad, MD  Reason for visit:  Surveillance  History of present illness:    68 y/o lady with history of obesity, a. Fib on eliquis  (last dose 3 days ago), and hypertension here for colonoscopy for history of adenomatous polyp. Last colonoscopy was last year but prep inadequate. History of cholecystectomy. No family history of GI malignancies.     Current Facility-Administered Medications:    0.9 %  sodium chloride  infusion, , Intravenous, Continuous, Kierston Plasencia, Kristin ONEIDA, MD, Last Rate: 20 mL/hr at 02/26/24 1216, New Bag at 02/26/24 1216  Medications Prior to Admission  Medication Sig Dispense Refill Last Dose/Taking   atenolol  (TENORMIN ) 50 MG tablet Take 1 tablet (50 mg total) by mouth 2 (two) times daily. 180 tablet 3 02/26/2024   atorvastatin  (LIPITOR) 40 MG tablet TAKE 1 TABLET BY MOUTH EVERY DAY 90 tablet 2 02/25/2024   lamoTRIgine  (LAMICTAL ) 100 MG tablet TAKE 1 TABLET(100 MG) BY MOUTH TWICE DAILY 180 tablet 3 02/26/2024   losartan  (COZAAR ) 100 MG tablet Take 50 mg by mouth 2 (two) times daily.   02/26/2024   acetaminophen  (TYLENOL ) 650 MG CR tablet Take by mouth.      diltiazem  (CARDIZEM  CD) 240 MG 24 hr capsule Take 1 capsule (240 mg total) by mouth daily. 90 capsule 3    ELIQUIS  5 MG TABS tablet TAKE 1 TABLET(5 MG) BY MOUTH TWICE DAILY 180 tablet 1 02/23/2024   tirzepatide  (MOUNJARO ) 10 MG/0.5ML Pen Inject 10 mg into the skin once a week. 6 mL 2 02/12/2024     No Known Allergies   Past Medical History:  Diagnosis Date   Afib (HCC)    Cellulitis of right arm    Embolus to lower extremity (HCC)    a. 05/2014 s/p thrombolysis of LLE.   Hiatal hernia    History of echocardiogram    a. 07/2020 Echo: EF 55-60%, no rwma, Nl RV size/fxn. Mildly dil LA. Mild MR/MS. Sev mitral annular Ca2+. Mild Ao sclerosis w/o stenosis.   History of stress test    a. 05/2014 lexiscan MV: EF 67%,  no ischemia/infarct.   Hypercholesteremia    Hyperglycemia    Hypertension    Left Cerebellopontine Angle Meningioma (HCC)    a. 10/2016 s/p Gamma knife.   Osteoarthritis    PAF (paroxysmal atrial fibrillation) (HCC)    a. Dx 10/2014->converted on amio. CHA2DS2VASc = 3-4-->Eliquis .   Proteinuria    Shingles    dec 2021   Spinal stenosis at L4-L5 level    Thrombophlebitis of posterior tibial vein (HCC) 06/01/2014   Trigeminal neuralgia 08/2016    Review of systems:  Otherwise negative.    Physical Exam  Gen: Alert, oriented. Appears stated age.  HEENT: PERRLA. Lungs: No respiratory distress CV: RRR Abd: soft, benign, no masses Ext: No edema    Planned procedures: Proceed with colonoscopy. The patient understands the nature of the planned procedure, indications, risks, alternatives and potential complications including but not limited to bleeding, infection, perforation, damage to internal organs and possible oversedation/side effects from anesthesia. The patient agrees and gives consent to proceed.  Please refer to procedure notes for findings, recommendations and patient disposition/instructions.     Kristin ONEIDA Schick, MD [facility]

## 2024-04-07 ENCOUNTER — Encounter: Payer: Self-pay | Admitting: Neurology

## 2024-04-07 ENCOUNTER — Ambulatory Visit: Payer: PPO | Admitting: Neurology

## 2024-04-07 VITALS — BP 159/94 | HR 90 | Ht 60.0 in | Wt 162.5 lb

## 2024-04-07 DIAGNOSIS — G500 Trigeminal neuralgia: Secondary | ICD-10-CM | POA: Diagnosis not present

## 2024-04-07 DIAGNOSIS — Z923 Personal history of irradiation: Secondary | ICD-10-CM

## 2024-04-07 DIAGNOSIS — R260 Ataxic gait: Secondary | ICD-10-CM

## 2024-04-07 DIAGNOSIS — D329 Benign neoplasm of meninges, unspecified: Secondary | ICD-10-CM | POA: Diagnosis not present

## 2024-04-07 MED ORDER — LAMOTRIGINE 100 MG PO TABS: ORAL_TABLET | ORAL | 3 refills | Status: AC

## 2024-04-07 NOTE — Progress Notes (Signed)
 [facility]  PATIENT: Kristin James DOB: 08/26/1956  REFERRING DOCTOR OR PCP:  Allena Hamilton SOURCE: Patient, notes from Dr. Hamilton,  _________________________________   HISTORICAL  CHIEF COMPLAINT:  Chief Complaint  Patient presents with   RM11/MENINGIOMA    Pt is here with her Husband. Pt states that she has been stable since her last appointment.     HISTORY OF PRESENT ILLNESS:  Kristin James is a 69 y.o. woman with a meningioma causing left trigeminal neuralgia.  Update 04/08/2023: She feels her facial pain is doing very well on the current regimen.    She has a left cerebellopontine angle meningioma that has been treated with gamma knife in .  She is followed at [facility] and she had a follow-up MRI 02/04/2024 showing stability of the meningioma.  She feels her pain is very well controlled on her current medications.  She is currently on lamotrigine  100 mg po once a day.  She had bven on oxcarbazepine  when pain was worse as well.   She is no longer on amitriptyline  she had gamma knofe 10/2016.   Recent MRIs show no further growth.      Balance is poor and she carries a cane but not actually using it.   Fewer falls this year.   She has lost 50 pounds on Mounjaro .       No facial weakness or numbness.    She has some falls, but none recently.  She tries to walk 10,000 steps most days.  We discussed that her balance issues could be a combination of the meningioma, radiation and medication side effect  She had shingles in January 2022.  The distribution was midthoracic going from the back to under the breasts on the left.  She still has some allodynia and numbness but not really pain.     Left cerebellopontine angle meningioma and resection She began to experience a dull ache in the left jaw region in mid 2017. Initially, she felt her symptoms were due to a dental issue and she had a tooth removed and a repeat root canal.  A few months later she began to  experience short shock like severe pain in the left jaw.  She would get frequent zaps of severe pain into the left cheek.  When pain was more severe, she couldn't talk.   Brushing her teeth would trigger the most painful sensations.   She was initially placed on gabapentin  100 mg several times a day by her PCP and then Tegretol was added. The Tegretol was switched to Trileptal  for tolerability.   MRI of the brain showed that she had a meningioma that involved the facial nerve region.        She continued to have pain and was referred to neurosurgery at [facility] for evaluation of gamma knife procedures.   March 2018, she underwent Gamma knife stereotactic radiosurgery by Dr. Rosine and Dr. Candyce at [facility] neurosurgery and radiation oncology at a dose of 20 Gy was delivered to the outlined tumor divided into 4 fractions.   She felt pain improved some initially but did not resolve.  Pain worsened a few months later despite increases in medication and she was referred to me.  I placed her on lamotrigine  and titrate up to 100 mg p.o. twice daily. She is not a candidate for repeat radiation or surgery.     Imaging:  MRI of the brain 10/01/2016 performed at [facility] shows a large  left cerebellopontine angle meningioma extending into the left internal auditory canal. There is mass effect on the pons and left vertebral artery. The tumor crosses the expected path of the left cranial nerves V, VI, VII and VIII.   MRI of the brain 11/17/2016 showing the same mass and mentioning redemonstration of encephalomalacia in the left parieto-occipital region and lacunar infarction in the right cerebellum and thalamus.  MRI 03/23/2019 [facility]) 1.  Redemonstrated extra-axial dural based mass along the left cerebellar pontine angle with extension into the left internal auditory canal, left Meckel's cave, and Pars Nervosa of the left jugular foramen which is similar to possibly slightly decreased in size from exams  dated 07/09/2018 and 07/08/2017. 2.  No significant change in surrounding adjacent mass effect, particularly on the middle cerebellar peduncle and medulla/pons.   MRI 01/20/2021 showed no changes.\  MRI of the brain 02/04/2024 showed no change compared to 2022  REVIEW OF SYSTEMS: Constitutional: No fevers, chills, sweats, or change in appetite Eyes: No visual changes, double vision, eye pain Ear, nose and throat: No hearing loss, ear pain, nasal congestion, sore throat Cardiovascular: No chest pain, palpitations Respiratory:  No shortness of breath at rest or with exertion.   No wheezes GastrointestinaI: No nausea, vomiting, diarrhea, abdominal pain, fecal incontinence Genitourinary:  No dysuria, urinary retention or frequency.  No nocturia. Musculoskeletal:  No neck pain, back pain Integumentary: No rash, pruritus, skin lesions Neurological: as above Psychiatric: No depression at this time.  No anxiety Endocrine: No palpitations, diaphoresis, change in appetite, change in weigh or increased thirst Hematologic/Lymphatic:  No anemia, purpura, petechiae. Allergic/Immunologic: No itchy/runny eyes, nasal congestion, recent allergic reactions, rashes  ALLERGIES: No Known Allergies  HOME MEDICATIONS:  Current Outpatient Medications:    acetaminophen  (TYLENOL ) 650 MG CR tablet, Take by mouth., Disp: , Rfl:    atenolol  (TENORMIN ) 50 MG tablet, Take 1 tablet (50 mg total) by mouth 2 (two) times daily., Disp: 180 tablet, Rfl: 3   atorvastatin  (LIPITOR) 40 MG tablet, TAKE 1 TABLET BY MOUTH EVERY DAY, Disp: 90 tablet, Rfl: 2   diltiazem  (CARDIZEM  CD) 240 MG 24 hr capsule, Take 1 capsule (240 mg total) by mouth daily. (Patient taking differently: Take 120 mg by mouth daily.), Disp: 90 capsule, Rfl: 3   ELIQUIS  5 MG TABS tablet, TAKE 1 TABLET(5 MG) BY MOUTH TWICE DAILY, Disp: 180 tablet, Rfl: 1   losartan  (COZAAR ) 100 MG tablet, Take 50 mg by mouth 2 (two) times daily. (Patient taking differently:  Take 50 mg by mouth daily.), Disp: , Rfl:    tirzepatide  (MOUNJARO ) 10 MG/0.5ML Pen, Inject 10 mg into the skin once a week., Disp: 6 mL, Rfl: 2   lamoTRIgine  (LAMICTAL ) 100 MG tablet, TAKE 1 TABLET(100 MG) BY MOUTH TWICE DAILY, Disp: 180 tablet, Rfl: 3  PAST MEDICAL HISTORY: Past Medical History:  Diagnosis Date   Afib (HCC)    Cellulitis of right arm    Embolus to lower extremity (HCC)    a. 05/2014 s/p thrombolysis of LLE.   Hiatal hernia    History of echocardiogram    a. 07/2020 Echo: EF 55-60%, no rwma, Nl RV size/fxn. Mildly dil LA. Mild MR/MS. Sev mitral annular Ca2+. Mild Ao sclerosis w/o stenosis.   History of stress test    a. 05/2014 lexiscan MV: EF 67%, no ischemia/infarct.   Hypercholesteremia    Hyperglycemia    Hypertension    Left Cerebellopontine Angle Meningioma (HCC)    a. 10/2016 s/p Gamma knife.  Osteoarthritis    PAF (paroxysmal atrial fibrillation) (HCC)    a. Dx 10/2014->converted on amio. CHA2DS2VASc = 3-4-->Eliquis .   Proteinuria    Shingles    dec 2021   Spinal stenosis at L4-L5 level    Thrombophlebitis of posterior tibial vein (HCC) 06/01/2014   Trigeminal neuralgia 08/2016    PAST SURGICAL HISTORY: Past Surgical History:  Procedure Laterality Date   AORTOGRAM     BACK SURGERY  11/13/2014   CARDIAC CATHETERIZATION     CARDIOVERSION N/A 07/19/2020   Procedure: CARDIOVERSION;  Surgeon: Perla Evalene PARAS, MD;  Location: [facility] ORS;  Service: Cardiovascular;  Laterality: N/A;   CHOLECYSTECTOMY     COLONOSCOPY N/A 02/26/2024   Procedure: COLONOSCOPY;  Surgeon: Maryruth Ole DASEN, MD;  Location: [facility] ENDOSCOPY;  Service: Endoscopy;  Laterality: N/A;  Patient on Eliquis  and MOUNJARO    COLONOSCOPY WITH PROPOFOL  N/A 08/15/2016   Procedure: COLONOSCOPY WITH PROPOFOL ;  Surgeon: Gladis RAYMOND Mariner, MD;  Location: [facility] ENDOSCOPY;  Service: Endoscopy;  Laterality: N/A;   COLONOSCOPY WITH PROPOFOL  N/A 11/07/2022   Procedure: COLONOSCOPY WITH PROPOFOL ;  Surgeon:  Maryruth Ole DASEN, MD;  Location: [facility] ENDOSCOPY;  Service: Endoscopy;  Laterality: N/A;   JOINT REPLACEMENT     KNEE ARTHROSCOPY     LOWER EXTREMITY ANGIOGRAM      FAMILY HISTORY: Family History  Problem Relation Age of Onset   Hypertension Mother    Dementia Mother    Stroke Father        3 strokes   Hypertension Father    Breast cancer Paternal Grandmother    Colon cancer Neg Hx     SOCIAL HISTORY:  Social History   Socioeconomic History   Marital status: Married    Spouse name: Not on file   Number of children: 2   Years of education: Not on file   Highest education level: Not on file  Occupational History   Not on file  Tobacco Use   Smoking status: Never   Smokeless tobacco: Never  Vaping Use   Vaping status: Never Used  Substance and Sexual Activity   Alcohol use: Not Currently    Comment: occ. wine   Drug use: No   Sexual activity: Not on file  Other Topics Concern   Not on file  Social History Narrative   Right handed    Caffeine use: coffee (1/2 cup per day)   Social Drivers of Health   Financial Resource Strain: Not on file  Food Insecurity: Not on file  Transportation Needs: Not on file  Physical Activity: Unknown (09/07/2023)   Exercise Vital Sign    Days of Exercise per Week: 2 days    Minutes of Exercise per Session: Not on file  Stress: No Stress Concern Present (09/07/2023)   [employer] of Occupational Health - Occupational Stress Questionnaire    Feeling of Stress : Only a little  Social Connections: Unknown (09/07/2023)   Social Connection and Isolation Panel    Frequency of Communication with Friends and Family: Not on file    Frequency of Social Gatherings with Friends and Family: Not on file    Attends Religious Services: Not on file    Active Member of Clubs or Organizations: Yes    Attends Banker Meetings: Not on file    Marital Status: Not on file  Intimate Partner Violence: Not on file     PHYSICAL  EXAM  Vitals:   04/07/24 1051  BP: (!) 159/94  Pulse: 90  SpO2: 99%  Weight: 162 lb 8 oz (73.7 kg)  Height: 5' (1.524 m)    Body mass index is 31.74 kg/m.   General: The patient is well-developed and well-nourished and in no acute distress  Neurologic Exam  Mental status: The patient is alert and oriented x 3 at the time of the examination. The patient has apparent normal recent and remote memory, with an apparently normal attention span and concentration ability.   Speech is normal.  Cranial nerves: Extraocular movements are full. Facial symmetry is present.  Facial strength and sensation was normal.  Trapezius and sternocleidomastoid strength is normal. No dysarthria is noted.   No obvious hearing deficits are noted.  Motor:  Muscle bulk is normal.   Tone is normal. Strength is  5 / 5 in all 4 extremities.   Sensory: Sensory testing is intact to touch and vibration sensation in all 4 extremities.   Mild altered sensation around  +/- T4 on left  Coordination: Cerebellar testing reveals good finger-nose-finger and heel-to-shin bilaterally.  Gait and station: Station is normal.   Her gait is wide and tandem is poor.   She walks without a cane in the room.   Romberg is negative.   Reflexes: Deep tendon reflexes are symmetric and normal bilaterally.       DIAGNOSTIC DATA (LABS, IMAGING, TESTING) - I reviewed patient records, labs, notes, testing and imaging myself where available.  Lab Results  Component Value Date   WBC 7.9 01/11/2024   HGB 16.7 (H) 01/11/2024   HCT 50.3 (H) 01/11/2024   MCV 93.6 01/11/2024   PLT 209.0 01/11/2024      Component Value Date/Time   NA 140 01/11/2024 0943   NA 139 07/11/2020 1404   NA 142 05/23/2014 0540   K 5.0 01/11/2024 0943   K 3.7 05/23/2014 0540   CL 105 01/11/2024 0943   CL 114 (H) 05/23/2014 0540   CO2 27 01/11/2024 0943   CO2 21 05/23/2014 0540   GLUCOSE 98 01/11/2024 0943   GLUCOSE 90 05/23/2014 0540   BUN 24 (H)  01/11/2024 0943   BUN 28 (H) 07/11/2020 1404   BUN 16 05/23/2014 0540   CREATININE 1.18 01/11/2024 0943   CREATININE 1.07 05/23/2014 0540   CALCIUM  9.5 01/11/2024 0943   CALCIUM  7.4 (L) 05/23/2014 0540   PROT 7.2 01/11/2024 0943   PROT 6.8 03/17/2022 0901   PROT 7.4 05/17/2013 1028   ALBUMIN 4.2 01/11/2024 0943   ALBUMIN 3.6 05/17/2013 1028   AST 23 01/11/2024 0943   AST 28 05/17/2013 1028   ALT 24 01/11/2024 0943   ALT 27 05/17/2013 1028   ALKPHOS 132 (H) 01/11/2024 0943   ALKPHOS 103 05/17/2013 1028   BILITOT 1.1 01/11/2024 0943   BILITOT 0.8 05/17/2013 1028   GFRNONAA 40 (L) 07/11/2020 1404   GFRNONAA 57 (L) 05/23/2014 0540   GFRAA 46 (L) 07/11/2020 1404   GFRAA >60 05/23/2014 0540   Lab Results  Component Value Date   CHOL 150 01/11/2024   HDL 46.60 01/11/2024   LDLCALC 84 01/11/2024   TRIG 95.0 01/11/2024   CHOLHDL 3 01/11/2024   Lab Results  Component Value Date   HGBA1C 5.7 01/11/2024   No results found for: VITAMINB12 Lab Results  Component Value Date   TSH 2.99 01/11/2024       ASSESSMENT AND PLAN   Meningioma (HCC)  Ataxic gait  Trigeminal neuralgia of left side of face  Status post gamma knife treatment  1.  Continue lamotrigine   but cut to 50 mg po every day as higher dose affects balance.  If pain increases can go back to 100 mg twice daily during the flare,   2.  If pain worsens or if gait worsens, additional treatment of the meningioma, either gamma knife or surgery, may be necessary.  She is followed by Dr. Rosine (Neurosurgeon) and Dr. Tina ([facility]) 3.   Stay active and exercise as tolerated.   Use cane for safety.   Advised to get grab bars in the shower. 4.   RTC 12 months, sooner if problems  This visit is part of a comprehensive longitudinal care medical relationship regarding the patients primary diagnosis of Meningioma/trigeminal neuralgia and related concerns.    Kinser Fellman A. Vear, MD, [facility] 04/07/2024, 11:34 AM Certified in  Neurology, Clinical Neurophysiology, Sleep Medicine, Pain Medicine and Neuroimaging  [facility] [address] [place], [place] [address] [phone]

## 2024-04-28 ENCOUNTER — Telehealth: Payer: Self-pay | Admitting: Internal Medicine

## 2024-04-28 ENCOUNTER — Other Ambulatory Visit: Payer: Self-pay | Admitting: Internal Medicine

## 2024-04-28 DIAGNOSIS — E7800 Pure hypercholesterolemia, unspecified: Secondary | ICD-10-CM

## 2024-04-28 DIAGNOSIS — E1165 Type 2 diabetes mellitus with hyperglycemia: Secondary | ICD-10-CM

## 2024-04-28 DIAGNOSIS — D582 Other hemoglobinopathies: Secondary | ICD-10-CM

## 2024-04-28 NOTE — Progress Notes (Signed)
Order placed for f/u lab.   

## 2024-04-28 NOTE — Telephone Encounter (Signed)
Need lab order

## 2024-05-11 ENCOUNTER — Other Ambulatory Visit (INDEPENDENT_AMBULATORY_CARE_PROVIDER_SITE_OTHER)

## 2024-05-11 DIAGNOSIS — D582 Other hemoglobinopathies: Secondary | ICD-10-CM | POA: Diagnosis not present

## 2024-05-11 DIAGNOSIS — E1165 Type 2 diabetes mellitus with hyperglycemia: Secondary | ICD-10-CM

## 2024-05-11 DIAGNOSIS — E7800 Pure hypercholesterolemia, unspecified: Secondary | ICD-10-CM

## 2024-05-11 LAB — CBC WITH DIFFERENTIAL/PLATELET
Basophils Absolute: 0.1 K/uL (ref 0.0–0.1)
Basophils Relative: 1.3 % (ref 0.0–3.0)
Eosinophils Absolute: 0.2 K/uL (ref 0.0–0.7)
Eosinophils Relative: 2.7 % (ref 0.0–5.0)
HCT: 46.9 % — ABNORMAL HIGH (ref 36.0–46.0)
Hemoglobin: 15.8 g/dL — ABNORMAL HIGH (ref 12.0–15.0)
Lymphocytes Relative: 29.7 % (ref 12.0–46.0)
Lymphs Abs: 2.1 K/uL (ref 0.7–4.0)
MCHC: 33.7 g/dL (ref 30.0–36.0)
MCV: 90.9 fl (ref 78.0–100.0)
Monocytes Absolute: 0.7 K/uL (ref 0.1–1.0)
Monocytes Relative: 9.9 % (ref 3.0–12.0)
Neutro Abs: 4 K/uL (ref 1.4–7.7)
Neutrophils Relative %: 56.4 % (ref 43.0–77.0)
Platelets: 202 K/uL (ref 150.0–400.0)
RBC: 5.16 Mil/uL — ABNORMAL HIGH (ref 3.87–5.11)
RDW: 13.8 % (ref 11.5–15.5)
WBC: 7 K/uL (ref 4.0–10.5)

## 2024-05-11 LAB — LIPID PANEL
Cholesterol: 130 mg/dL (ref 0–200)
HDL: 45.9 mg/dL (ref >39.00)
LDL Cholesterol: 69 mg/dL (ref 0–99)
NonHDL: 84.51
Total CHOL/HDL Ratio: 3
Triglycerides: 77 mg/dL (ref 0.0–149.0)
VLDL: 15.4 mg/dL (ref 0.0–40.0)

## 2024-05-11 LAB — BASIC METABOLIC PANEL WITH GFR
BUN: 25 mg/dL — ABNORMAL HIGH (ref 6–23)
CO2: 29 meq/L (ref 19–32)
Calcium: 9.5 mg/dL (ref 8.4–10.5)
Chloride: 105 meq/L (ref 96–112)
Creatinine, Ser: 1.15 mg/dL (ref 0.40–1.20)
GFR: 48.83 mL/min — ABNORMAL LOW (ref >60.00)
Glucose, Bld: 97 mg/dL (ref 70–99)
Potassium: 4.7 meq/L (ref 3.5–5.1)
Sodium: 142 meq/L (ref 135–145)

## 2024-05-11 LAB — HEPATIC FUNCTION PANEL
ALT: 25 U/L (ref 0–35)
AST: 21 U/L (ref 0–37)
Albumin: 4.2 g/dL (ref 3.5–5.2)
Alkaline Phosphatase: 140 U/L — ABNORMAL HIGH (ref 39–117)
Bilirubin, Direct: 0.2 mg/dL (ref 0.0–0.3)
Total Bilirubin: 1 mg/dL (ref 0.2–1.2)
Total Protein: 6.7 g/dL (ref 6.0–8.3)

## 2024-05-11 LAB — HEMOGLOBIN A1C: Hgb A1c MFr Bld: 6 % (ref 4.6–6.5)

## 2024-05-12 ENCOUNTER — Ambulatory Visit: Payer: Self-pay | Admitting: Internal Medicine

## 2024-05-13 ENCOUNTER — Ambulatory Visit (INDEPENDENT_AMBULATORY_CARE_PROVIDER_SITE_OTHER): Admitting: Internal Medicine

## 2024-05-13 VITALS — BP 126/70 | HR 82 | Resp 16 | Ht 60.0 in | Wt 163.4 lb

## 2024-05-13 DIAGNOSIS — I739 Peripheral vascular disease, unspecified: Secondary | ICD-10-CM

## 2024-05-13 DIAGNOSIS — R748 Abnormal levels of other serum enzymes: Secondary | ICD-10-CM | POA: Diagnosis not present

## 2024-05-13 DIAGNOSIS — E7800 Pure hypercholesterolemia, unspecified: Secondary | ICD-10-CM | POA: Diagnosis not present

## 2024-05-13 DIAGNOSIS — Z7985 Long-term (current) use of injectable non-insulin antidiabetic drugs: Secondary | ICD-10-CM | POA: Diagnosis not present

## 2024-05-13 DIAGNOSIS — R260 Ataxic gait: Secondary | ICD-10-CM | POA: Diagnosis not present

## 2024-05-13 DIAGNOSIS — E1165 Type 2 diabetes mellitus with hyperglycemia: Secondary | ICD-10-CM

## 2024-05-13 DIAGNOSIS — I10 Essential (primary) hypertension: Secondary | ICD-10-CM

## 2024-05-13 DIAGNOSIS — I480 Paroxysmal atrial fibrillation: Secondary | ICD-10-CM

## 2024-05-13 DIAGNOSIS — D582 Other hemoglobinopathies: Secondary | ICD-10-CM

## 2024-05-13 DIAGNOSIS — Z1211 Encounter for screening for malignant neoplasm of colon: Secondary | ICD-10-CM

## 2024-05-13 DIAGNOSIS — D329 Benign neoplasm of meninges, unspecified: Secondary | ICD-10-CM | POA: Diagnosis not present

## 2024-05-13 DIAGNOSIS — N1832 Chronic kidney disease, stage 3b: Secondary | ICD-10-CM | POA: Diagnosis not present

## 2024-05-13 DIAGNOSIS — G500 Trigeminal neuralgia: Secondary | ICD-10-CM | POA: Diagnosis not present

## 2024-05-13 LAB — HM DIABETES FOOT EXAM

## 2024-05-13 NOTE — Progress Notes (Unsigned)
 Subjective:    Patient ID: Kristin James, female    DOB: 1955-11-21, 68 y.o.   MRN: [id number]  Patient here for  Chief Complaint  Patient presents with   Medical Management of Chronic Issues    HPI Here for a scheduled follow up - follow up regarding hypercholesterolemia, hypertension and diabetes. Currently on mounjaro . Dose increased to 10mg  last visit. Had f/u with cardiology 09/22/23. Recommended increase diltiazem  ER to 240mg  q day. Continue losartan  50mg  bid and atenolol  50mg  bid. Had f/u with Dr Vear 04/07/24 - follow up MRI 02/04/24 - stability of the meningioma. Also f/u - ataxic gait. Recommended to continue lamictal , but decrease to 50mg  q day - to see if helps with balance. She has not decreased dose yet, but plans to decrease. S/p colonoscopy 02/26/24 - diverticulosis and internal hemorrhoids. Recommended f/u colonoscopy in 10 years. She reports that over the last couple of days, she has noticed more fatigue. Some runny nose. Her husband has golfed with men - tested positive for covid. Wants to be checked. Husband not sick. Denies any sore throat, sinus congestion, vomiting or diarrhea. No increased cough or sob. She does report low toe nail one month ago. Great toenail - questioning fungal infection. Plans to f/u with Dr Ronda.    Past Medical History:  Diagnosis Date   Afib (HCC)    Cellulitis of right arm    Embolus to lower extremity (HCC)    a. 05/2014 s/p thrombolysis of LLE.   Hiatal hernia    History of echocardiogram    a. 07/2020 Echo: EF 55-60%, no rwma, Nl RV size/fxn. Mildly dil LA. Mild MR/MS. Sev mitral annular Ca2+. Mild Ao sclerosis w/o stenosis.   History of stress test    a. 05/2014 lexiscan MV: EF 67%, no ischemia/infarct.   Hypercholesteremia    Hyperglycemia    Hypertension    Left Cerebellopontine Angle Meningioma (HCC)    a. 10/2016 s/p Gamma knife.   Osteoarthritis    PAF (paroxysmal atrial fibrillation) (HCC)    a. Dx 10/2014->converted on amio.  CHA2DS2VASc = 3-4-->Eliquis .   Proteinuria    Shingles    dec 2021   Spinal stenosis at L4-L5 level    Thrombophlebitis of posterior tibial vein (HCC) 06/01/2014   Trigeminal neuralgia 08/2016   Past Surgical History:  Procedure Laterality Date   AORTOGRAM     BACK SURGERY  11/13/2014   CARDIAC CATHETERIZATION     CARDIOVERSION N/A 07/19/2020   Procedure: CARDIOVERSION;  Surgeon: Perla Evalene PARAS, MD;  Location: [facility] ORS;  Service: Cardiovascular;  Laterality: N/A;   CHOLECYSTECTOMY     COLONOSCOPY N/A 02/26/2024   Procedure: COLONOSCOPY;  Surgeon: Maryruth Ole DASEN, MD;  Location: [facility] ENDOSCOPY;  Service: Endoscopy;  Laterality: N/A;  Patient on Eliquis  and MOUNJARO    COLONOSCOPY WITH PROPOFOL  N/A 08/15/2016   Procedure: COLONOSCOPY WITH PROPOFOL ;  Surgeon: Gladis RAYMOND Mariner, MD;  Location: [facility] ENDOSCOPY;  Service: Endoscopy;  Laterality: N/A;   COLONOSCOPY WITH PROPOFOL  N/A 11/07/2022   Procedure: COLONOSCOPY WITH PROPOFOL ;  Surgeon: Maryruth Ole DASEN, MD;  Location: [facility] ENDOSCOPY;  Service: Endoscopy;  Laterality: N/A;   JOINT REPLACEMENT     KNEE ARTHROSCOPY     LOWER EXTREMITY ANGIOGRAM     Family History  Problem Relation Age of Onset   Hypertension Mother    Dementia Mother    Stroke Father        3 strokes   Hypertension Father    Breast  cancer Paternal Grandmother    Colon cancer Neg Hx    Social History   Socioeconomic History   Marital status: Married    Spouse name: Not on file   Number of children: 2   Years of education: Not on file   Highest education level: Not on file  Occupational History   Not on file  Tobacco Use   Smoking status: Never   Smokeless tobacco: Never  Vaping Use   Vaping status: Never Used  Substance and Sexual Activity   Alcohol use: Not Currently    Comment: occ. wine   Drug use: No   Sexual activity: Not on file  Other Topics Concern   Not on file  Social History Narrative   Right handed    Caffeine use: coffee  (1/2 cup per day)   Social Drivers of Health   Financial Resource Strain: Not on file  Food Insecurity: Not on file  Transportation Needs: Not on file  Physical Activity: Unknown (05/12/2024)   Exercise Vital Sign    Days of Exercise per Week: 3 days    Minutes of Exercise per Session: Not on file  Stress: No Stress Concern Present (09/07/2023)   [employer] of Occupational Health - Occupational Stress Questionnaire    Feeling of Stress : Only a little  Social Connections: Unknown (05/12/2024)   Social Connection and Isolation Panel    Frequency of Communication with Friends and Family: Not on file    Frequency of Social Gatherings with Friends and Family: Not on file    Attends Religious Services: Not on file    Active Member of Clubs or Organizations: Patient declined    Attends Banker Meetings: Not on file    Marital Status: Not on file     Review of Systems  Constitutional:  Negative for appetite change and unexpected weight change.  HENT:  Positive for congestion and rhinorrhea.   Respiratory:  Negative for cough, chest tightness and shortness of breath.   Cardiovascular:  Negative for chest pain, palpitations and leg swelling.  Gastrointestinal:  Negative for abdominal pain, diarrhea, nausea and vomiting.  Genitourinary:  Negative for difficulty urinating and dysuria.  Musculoskeletal:  Negative for joint swelling and myalgias.  Skin:  Negative for color change and rash.  Neurological:  Negative for dizziness and headaches.  Psychiatric/Behavioral:  Negative for agitation and dysphoric mood.        Objective:     BP 126/70   Pulse 82   Resp 16   Ht 5' (1.524 m)   Wt 163 lb 6.4 oz (74.1 kg)   SpO2 98%   BMI 31.91 kg/m  Wt Readings from Last 3 Encounters:  05/13/24 163 lb 6.4 oz (74.1 kg)  04/07/24 162 lb 8 oz (73.7 kg)  01/11/24 168 lb (76.2 kg)    Physical Exam Vitals reviewed.  Constitutional:      General: She is not in acute  distress.    Appearance: Normal appearance.  HENT:     Head: Normocephalic and atraumatic.     Right Ear: External ear normal.     Left Ear: External ear normal.  Eyes:     General: No scleral icterus.       Right eye: No discharge.        Left eye: No discharge.     Conjunctiva/sclera: Conjunctivae normal.  Neck:     Thyroid : No thyromegaly.  Cardiovascular:     Rate and Rhythm: Normal rate and  regular rhythm.  Pulmonary:     Effort: No respiratory distress.     Breath sounds: Normal breath sounds. No wheezing.  Abdominal:     General: Bowel sounds are normal.     Palpations: Abdomen is soft.     Tenderness: There is no abdominal tenderness.  Musculoskeletal:        General: No swelling or tenderness.     Cervical back: Neck supple. No tenderness.  Lymphadenopathy:     Cervical: No cervical adenopathy.  Skin:    Findings: No erythema or rash.  Neurological:     Mental Status: She is alert.  Psychiatric:        Mood and Affect: Mood normal.        Behavior: Behavior normal.         Outpatient Encounter Medications as of 05/13/2024  Medication Sig   acetaminophen  (TYLENOL ) 650 MG CR tablet Take by mouth.   atenolol  (TENORMIN ) 50 MG tablet Take 1 tablet (50 mg total) by mouth 2 (two) times daily.   atorvastatin  (LIPITOR) 40 MG tablet TAKE 1 TABLET BY MOUTH EVERY DAY   diltiazem  (CARDIZEM  CD) 240 MG 24 hr capsule Take 1 capsule (240 mg total) by mouth daily. (Patient taking differently: Take 120 mg by mouth daily.)   ELIQUIS  5 MG TABS tablet TAKE 1 TABLET(5 MG) BY MOUTH TWICE DAILY   lamoTRIgine  (LAMICTAL ) 100 MG tablet TAKE 1 TABLET(100 MG) BY MOUTH TWICE DAILY   losartan  (COZAAR ) 100 MG tablet Take 50 mg by mouth 2 (two) times daily. (Patient taking differently: Take 50 mg by mouth daily.)   tirzepatide  (MOUNJARO ) 10 MG/0.5ML Pen Inject 10 mg into the skin once a week.   No facility-administered encounter medications on file as of 05/13/2024.     Lab Results   Component Value Date   WBC 7.0 05/11/2024   HGB 15.8 (H) 05/11/2024   HCT 46.9 (H) 05/11/2024   PLT 202.0 05/11/2024   GLUCOSE 97 05/11/2024   CHOL 130 05/11/2024   TRIG 77.0 05/11/2024   HDL 45.90 05/11/2024   LDLCALC 69 05/11/2024   ALT 25 05/11/2024   AST 21 05/11/2024   NA 142 05/11/2024   K 4.7 05/11/2024   CL 105 05/11/2024   CREATININE 1.15 05/11/2024   BUN 25 (H) 05/11/2024   CO2 29 05/11/2024   TSH 2.99 01/11/2024   INR 1.5 (H) 06/03/2020   HGBA1C 6.0 05/11/2024       Assessment & Plan:  Trigeminal neuralgia of left side of face Assessment & Plan: No pain.  Off gabapentin . Continues on lamictal . Dr. Sater recommended lowering dose as outlind. Follow.    Type 2 diabetes mellitus with hyperglycemia, without long-term current use of insulin (HCC) Assessment & Plan: Low carb diet and exercise. Currently on mounjaro  10mg  q day. Tolerating. Continue current dose. Follow sugars. Follow met b and A1c.  Lab Results  Component Value Date   HGBA1C 6.0 05/11/2024     Orders: -     Hemoglobin A1c; Future -     Basic metabolic panel with GFR; Future -     Microalbumin / creatinine urine ratio; Future  Hypercholesterolemia Assessment & Plan: Continue lipitor. Low cholesterol diet and exercise. Follow lipid panel.  Lab Results  Component Value Date   CHOL 130 05/11/2024   HDL 45.90 05/11/2024   LDLCALC 69 05/11/2024   TRIG 77.0 05/11/2024   CHOLHDL 3 05/11/2024     Orders: -     Hepatic function  panel; Future -     Lipid panel; Future  Paroxysmal atrial fibrillation [facility]) Assessment & Plan: Followed by cardiology.  Currently on diltiazem  240mg  q day and atenolol . Continue eliquis . Stable. Follow.    PAD (peripheral artery disease) (HCC) Assessment & Plan: Status post angioplasty.  On Eliquis .  Continue risk factor modification.  Continue statin. Stable.    Meningioma [facility]) Assessment & Plan:  Had f/u with Dr Vear 04/07/24 - follow up MRI 02/04/24 -  stability of the meningioma.    Primary hypertension Assessment & Plan: Blood pressure as outlined. Continue diltiazem , atenolol  and losartan . Follow. No changes in medication today.    Elevated hemoglobin (HCC) Assessment & Plan: Hgb 15.8 with HCT 46.9 on recent check. Follow cbc.    Elevated alkaline phosphatase level Assessment & Plan: Previously saw GI.  Work-up including MRI/MRCP unrevealing.  Follow liver function test.  Recent alk phos - 140.    Stage 3b chronic kidney disease (HCC) Assessment & Plan: Off meloxicam .   Seeing nephrology.  Continue to avoid antiinflammatories. Stay hydrated.  Continue losartan . Have previously discussed the possibility of adding SGLT2 inhibitor. Blood pressure varies. Have discussed the possibility of SGLT 2 inhibitor lowering pressure.  Will hold at this time. Follow.    Ataxic gait Assessment & Plan: Evaluated recently by Dr Vear. Recommended decreasing dose of lamictal . Has not decreased yet. Plans to decrease.    Colon cancer screening Assessment & Plan: Just had colonoscopy 01/2024. Diverticulosis and internal hemorrhoids. Recommended f/u colonoscopy in 10 years.       Allena Hamilton, MD

## 2024-05-13 NOTE — Patient Instructions (Signed)
Nasacort nasal spray - 2 sprays each nostril one time per day.   

## 2024-05-15 ENCOUNTER — Encounter: Payer: Self-pay | Admitting: Internal Medicine

## 2024-05-15 DIAGNOSIS — Z1211 Encounter for screening for malignant neoplasm of colon: Secondary | ICD-10-CM | POA: Insufficient documentation

## 2024-05-15 NOTE — Assessment & Plan Note (Signed)
 Had f/u with Dr Vear 04/07/24 - follow up MRI 02/04/24 - stability of the meningioma.

## 2024-05-15 NOTE — Assessment & Plan Note (Signed)
 Off meloxicam .   Seeing nephrology.  Continue to avoid antiinflammatories. Stay hydrated.  Continue losartan . Have previously discussed the possibility of adding SGLT2 inhibitor. Blood pressure varies. Have discussed the possibility of SGLT 2 inhibitor lowering pressure.  Will hold at this time. Follow.

## 2024-05-15 NOTE — Assessment & Plan Note (Signed)
 Previously saw GI.  Work-up including MRI/MRCP unrevealing.  Follow liver function test.  Recent alk phos - 140.

## 2024-05-15 NOTE — Assessment & Plan Note (Signed)
 Blood pressure as outlined. Continue diltiazem , atenolol  and losartan . Follow.  No changes in medication today.

## 2024-05-15 NOTE — Assessment & Plan Note (Signed)
 Continue lipitor. Low cholesterol diet and exercise. Follow lipid panel.  Lab Results  Component Value Date   CHOL 130 05/11/2024   HDL 45.90 05/11/2024   LDLCALC 69 05/11/2024   TRIG 77.0 05/11/2024   CHOLHDL 3 05/11/2024

## 2024-05-15 NOTE — Assessment & Plan Note (Signed)
 Evaluated recently by Dr Vear. Recommended decreasing dose of lamictal . Has not decreased yet. Plans to decrease.

## 2024-05-15 NOTE — Assessment & Plan Note (Signed)
 Hgb 15.8 with HCT 46.9 on recent check. Follow cbc.

## 2024-05-15 NOTE — Assessment & Plan Note (Signed)
 Low carb diet and exercise. Currently on mounjaro  10mg  q day. Tolerating. Continue current dose. Follow sugars. Follow met b and A1c.  Lab Results  Component Value Date   HGBA1C 6.0 05/11/2024

## 2024-05-15 NOTE — Assessment & Plan Note (Signed)
 Followed by cardiology.  Currently on diltiazem  240mg  q day and atenolol . Continue eliquis . Stable. Follow.

## 2024-05-15 NOTE — Assessment & Plan Note (Signed)
 No pain.  Off gabapentin . Continues on lamictal . Dr. Sater recommended lowering dose as outlind. Follow.

## 2024-05-15 NOTE — Assessment & Plan Note (Signed)
 Status post angioplasty.  On Eliquis .  Continue risk factor modification.  Continue statin. Stable.

## 2024-05-15 NOTE — Assessment & Plan Note (Signed)
 Just had colonoscopy 01/2024. Diverticulosis and internal hemorrhoids. Recommended f/u colonoscopy in 10 years.

## 2024-05-31 ENCOUNTER — Other Ambulatory Visit: Payer: Self-pay | Admitting: Internal Medicine

## 2024-05-31 NOTE — Telephone Encounter (Unsigned)
 Copied from CRM #[id number]. Topic: Clinical - Medication Refill >> May 31, 2024  2:52 PM Roselie C wrote: Medication: tirzepatide  (MOUNJARO ) 10 MG/0.5ML Pen  Patient states doctor said was going to raise to 12.5 mg  Has the patient contacted their pharmacy? Yes (Agent: If no, request that the patient contact the pharmacy for the refill. If patient does not wish to contact the pharmacy document the reason why and proceed with request.) (Agent: If yes, when and what did the pharmacy advise?)  This is the patient's preferred pharmacy:  [id number] - [place], [place] - [address] AT [facility] OF [address] [place] [address] Phone: [phone] Fax: [phone]  Is this the correct pharmacy for this prescription? Yes If no, delete pharmacy and type the correct one.   Has the prescription been filled recently? No  Is the patient out of the medication? No  Has the patient been seen for an appointment in the last year OR does the patient have an upcoming appointment? Yes  Can we respond through MyChart? Yes  Agent: Please be advised that Rx refills may take up to 3 business days. We ask that you follow-up with your pharmacy.

## 2024-06-01 ENCOUNTER — Ambulatory Visit (INDEPENDENT_AMBULATORY_CARE_PROVIDER_SITE_OTHER): Admitting: Podiatry

## 2024-06-01 VITALS — Ht 60.0 in | Wt 163.4 lb

## 2024-06-01 DIAGNOSIS — B353 Tinea pedis: Secondary | ICD-10-CM | POA: Diagnosis not present

## 2024-06-01 DIAGNOSIS — B351 Tinea unguium: Secondary | ICD-10-CM | POA: Diagnosis not present

## 2024-06-01 MED ORDER — TERBINAFINE HCL 250 MG PO TABS: 250.0000 mg | ORAL_TABLET | Freq: Every day | ORAL | 0 refills | Status: AC

## 2024-06-01 MED ORDER — TIRZEPATIDE 10 MG/0.5ML ~~LOC~~ SOAJ: 10.0000 mg | SUBCUTANEOUS | 2 refills | Status: DC

## 2024-06-01 MED ORDER — KETOCONAZOLE 2 % EX CREA: 1.0000 | TOPICAL_CREAM | Freq: Every day | CUTANEOUS | 2 refills | Status: DC

## 2024-06-01 NOTE — Progress Notes (Signed)
 Subjective:  Patient ID: Kristin James, female    DOB: February 14, 1956,  MRN: [id number]  Chief Complaint  Patient presents with   Foot Problem    Rm 1 Patient is here for itching of the toes and thickened and discolored toe nails (bilateral).    Discussed the use of AI scribe software for clinical note transcription with the patient, who gave verbal consent to proceed.  History of Present Illness Kristin James is a 68 year old female who presents with worsening toenail and foot symptoms.  She describes her toenails as feeling like they are 'dying' and notes that the condition has worsened over the last several months. Over-the-counter treatments have been ineffective, and one toenail has fallen off. She experiences significant itching, particularly at night, with inflamed and peeling skin.  In 2022, a culture of her nails did not show any active fungus, according to her recollection. She has been soaking her feet, which provides temporary relief, but symptoms return at night.  She is concerned about the appearance of her toenails, describing them as 'ugly looking', and is worried about the redness and inflammation. She has been cautious about clipping her toenails, using a separate nail clipper for them. She mentions trying a '[place] pedicure' technique that involves steaming rather than soaking.  She is not on any specific medication for her toenail condition but has been using over-the-counter treatments without success.  No improvement with over-the-counter treatments. She reports itching, peeling, and erythematous rash on both feet, particularly at night.      Objective:    Physical Exam VASCULAR: DP and PT pulse palpable. Foot is warm and well-perfused. Capillary fill time is brisk. DERMATOLOGIC: Onychomycosis of multiple toenails, worst in bilateral hallux, with itching, peeling, erythematous moccasin distribution rash on both feet. Normal skin turgor, texture, and temperature.  No open lesions or ulcerations. NEUROLOGIC: Normal sensation to light touch and pressure. No paresthesias. ORTHOPEDIC: Smooth pain-free range of motion of all examined joints. No ecchymosis or bruising. No gross deformity. No pain to palpation.        Results    Assessment:  No diagnosis found.   Plan:  Patient was evaluated and treated and all questions answered.  Assessment and Plan Assessment & Plan Onychomycosis of multiple toenails with tinea pedis (moccasin distribution) Chronic onychomycosis of multiple toenails, with the worst involvement in the bilateral hallux, accompanied by tinea pedis in a moccasin distribution. Symptoms include itching, peeling, and erythematous rash on both feet. Previous cultures did not show active fungus, but current clinical presentation suggests active fungal infection. The condition has worsened over several months, and over-the-counter treatments have been ineffective. The infection appears to have spread from the nails to the skin. - Prescribe topical tenoconazole cream for skin application. - Prescribe oral terbinafine (Lamisil) for three months, monitoring for potential side effects such as nausea, cramping, diarrhea, and rare cases of loss of smell and taste or hair thinning. Approximately one in five patients experience gastrointestinal side effects, while loss of smell and taste is very rare and reversible upon discontinuation. - Advise continuation of soaking feet, as it is not harmful. - Caution against using the same nail clippers for infected and non-infected nails; recommend using a separate marked nail clipper. - Schedule follow-up in four months to assess treatment efficacy and nail regrowth. - Instruct to remove nail polish before the follow-up appointment for better assessment.      Return in about 4 months (around 10/02/2024) for follow up after nail  fungus treatment.

## 2024-06-02 ENCOUNTER — Telehealth: Payer: Self-pay

## 2024-06-02 NOTE — Telephone Encounter (Signed)
 PA request received form [facility] for Terbinafine 250mg  tablet. PA submitted through covermymeds and waiting on response. My take between 24-72 hours for a decision.  Boby Irving  (Key: [id number])

## 2024-06-06 ENCOUNTER — Telehealth: Payer: Self-pay | Admitting: Lab

## 2024-06-06 NOTE — Telephone Encounter (Signed)
 Can you write her fungal treatment for 84 pills that is all ins will authorize not for the 90.

## 2024-06-06 NOTE — Telephone Encounter (Signed)
 Disregard previous message pharmacy will run 84 pills.

## 2024-06-06 NOTE — Telephone Encounter (Signed)
 PA was denied

## 2024-06-29 ENCOUNTER — Telehealth: Payer: Self-pay | Admitting: Internal Medicine

## 2024-06-29 DIAGNOSIS — E1165 Type 2 diabetes mellitus with hyperglycemia: Secondary | ICD-10-CM

## 2024-06-29 NOTE — Telephone Encounter (Signed)
 Copied from CRM [id number]. Topic: Clinical - Prescription Issue >> Jun 29, 2024 11:13 AM Mesmerise C wrote: Reason for CRM: Patient states at appt on 9/12 was discussed to increase dosage of tirzepatide  (MOUNJARO ) to 12mg  stated she spoke to pharmacy and they don't have a prescription for it patient would like a call back once that's called in

## 2024-06-29 NOTE — Telephone Encounter (Signed)
 If tolerating 10mg  of mounjaro  and no problems, ok to increase to 12.5mg  weekly.

## 2024-06-30 MED ORDER — TIRZEPATIDE 12.5 MG/0.5ML ~~LOC~~ SOAJ: 12.5000 mg | SUBCUTANEOUS | 0 refills | Status: DC

## 2024-06-30 NOTE — Addendum Note (Signed)
 Addended by: Mashal Slavick on: 06/30/2024 04:03 PM   Modules accepted: Orders

## 2024-06-30 NOTE — Telephone Encounter (Signed)
 Spoke with pt. Pt stated she is not having side effects. New dosage sent

## 2024-07-16 ENCOUNTER — Other Ambulatory Visit: Payer: Self-pay | Admitting: Cardiology

## 2024-07-18 ENCOUNTER — Telehealth: Payer: Self-pay | Admitting: Cardiovascular Disease

## 2024-07-18 MED ORDER — ATENOLOL 50 MG PO TABS: 50.0000 mg | ORAL_TABLET | Freq: Two times a day (BID) | ORAL | 0 refills | Status: AC

## 2024-07-18 NOTE — Telephone Encounter (Signed)
*  STAT* If patient is at the pharmacy, call can be transferred to refill team.   1. Which medications need to be refilled? (please list name of each medication and dose if known)  atenolol  (TENORMIN ) 50 MG tablet    2. Would you like to learn more about the convenience, safety, & potential cost savings by using the [facility]?    3. Are you open to using the [facility] (Type [facility].  ).   4. Which pharmacy/location (including street and city if local pharmacy) is medication to be sent to? [id number] - [place], [place] - [address]    5. Do they need a 30 day or 90 day supply? 90 day

## 2024-07-18 NOTE — Telephone Encounter (Signed)
90 day supply has been sent.

## 2024-08-04 ENCOUNTER — Other Ambulatory Visit: Payer: Self-pay | Admitting: Internal Medicine

## 2024-08-04 DIAGNOSIS — Z1231 Encounter for screening mammogram for malignant neoplasm of breast: Secondary | ICD-10-CM

## 2024-08-10 ENCOUNTER — Other Ambulatory Visit: Payer: Self-pay | Admitting: Cardiovascular Disease

## 2024-08-10 DIAGNOSIS — I480 Paroxysmal atrial fibrillation: Secondary | ICD-10-CM

## 2024-08-10 NOTE — Telephone Encounter (Signed)
 Pt last saw Dr Perla 09/22/23, last labs 05/11/24 Creat 1.15, age 68, weight 74.1kg, based on specified criteria pt is on appropriate dosage of Eliquis  5mg  BID for afib.  Will refill rx.

## 2024-08-16 ENCOUNTER — Other Ambulatory Visit: Payer: Self-pay | Admitting: Internal Medicine

## 2024-08-16 NOTE — Telephone Encounter (Unsigned)
 Copied from CRM #[id number]. Topic: Clinical - Medication Refill >> Aug 16, 2024  3:10 PM Dedra B wrote: Medication:  losartan  (COZAAR ) 100 MG tablet (pt is cutting pills in half and wants to know if Dr. Glendia wants to prescribe 50 mg instead)   Has the patient contacted their pharmacy? Yes, no refills  This is the patient's preferred pharmacy:  [id number] - [place], [place] - [address] AT [facility] [address] [place] [address] Phone: [phone] Fax: [phone]  Is this the correct pharmacy for this prescription? Yes  Has the prescription been filled recently? No  Is the patient out of the medication? No, few days left  Has the patient been seen for an appointment in the last year OR does the patient have an upcoming appointment? Yes  Can we respond through MyChart? Yes  Agent: Please be advised that Rx refills may take up to 3 business days. We ask that you follow-up with your pharmacy.

## 2024-08-17 MED ORDER — LOSARTAN POTASSIUM 50 MG PO TABS: 50.0000 mg | ORAL_TABLET | Freq: Every day | ORAL | 1 refills | Status: DC

## 2024-08-17 NOTE — Telephone Encounter (Signed)
 Rx ok'd for losartan  50mg  q day.

## 2024-08-22 ENCOUNTER — Ambulatory Visit
Admission: RE | Admit: 2024-08-22 | Discharge: 2024-08-22 | Disposition: A | Discharge status: Home / Self Care | Source: Ambulatory Visit | Attending: Internal Medicine | Admitting: Internal Medicine

## 2024-08-22 DIAGNOSIS — Z1231 Encounter for screening mammogram for malignant neoplasm of breast: Secondary | ICD-10-CM | POA: Insufficient documentation

## 2024-08-29 ENCOUNTER — Telehealth: Payer: Self-pay | Admitting: Lab

## 2024-08-29 MED ORDER — CLOTRIMAZOLE-BETAMETHASONE 1-0.05 % EX CREA: 1.0000 | TOPICAL_CREAM | Freq: Every day | CUTANEOUS | 0 refills | Status: DC

## 2024-08-29 NOTE — Addendum Note (Signed)
 Addended byBETHA MEDICINE, Manasseh Pittsley R on: 08/29/2024 12:39 PM   Modules accepted: Orders

## 2024-08-29 NOTE — Telephone Encounter (Signed)
 Patient aware

## 2024-08-29 NOTE — Telephone Encounter (Signed)
 Patient states toes are looking better but experiencing severe itching on feet would like something called in for itching.

## 2024-09-06 ENCOUNTER — Telehealth: Payer: Self-pay

## 2024-09-06 NOTE — Telephone Encounter (Signed)
 I spoke with Ms. Auvil in regards to her Electrophysiology appointment on 09/07/24 with Suzann Riddle, NP. As per Suzann, your Afib and Blood Pressure is being managed by Dr. Evalene Lunger of our General Cardiology Team. Patient is more than welcome to keep your appointment with us  but it is completely appropriate for General Cardiology to continue your care at this time. Patient agreed to follow up with Dr. Gollan. Patient was made an appointment with Dr. Gollan on 10/18/2024 at 11:20 am with Dr. Gollan. Appointment with EP cancelled. Patient verbalized understanding and all questions answered at this time. Suzann Riddle, NP notified.

## 2024-09-07 ENCOUNTER — Ambulatory Visit: Admitting: Cardiology

## 2024-09-13 ENCOUNTER — Other Ambulatory Visit: Payer: Self-pay | Admitting: Internal Medicine

## 2024-09-13 DIAGNOSIS — E1165 Type 2 diabetes mellitus with hyperglycemia: Secondary | ICD-10-CM

## 2024-09-14 ENCOUNTER — Other Ambulatory Visit

## 2024-09-14 ENCOUNTER — Ambulatory Visit: Payer: Self-pay | Admitting: Internal Medicine

## 2024-09-14 DIAGNOSIS — E1165 Type 2 diabetes mellitus with hyperglycemia: Secondary | ICD-10-CM

## 2024-09-14 DIAGNOSIS — E7800 Pure hypercholesterolemia, unspecified: Secondary | ICD-10-CM

## 2024-09-14 LAB — LIPID PANEL
Cholesterol: 170 mg/dL (ref 28–200)
HDL: 50.8 mg/dL
LDL Cholesterol: 103 mg/dL — ABNORMAL HIGH (ref 10–99)
NonHDL: 119.3
Total CHOL/HDL Ratio: 3
Triglycerides: 81 mg/dL (ref 10.0–149.0)
VLDL: 16.2 mg/dL (ref 0.0–40.0)

## 2024-09-14 LAB — HEPATIC FUNCTION PANEL
ALT: 19 U/L (ref 3–35)
AST: 19 U/L (ref 5–37)
Albumin: 4.4 g/dL (ref 3.5–5.2)
Alkaline Phosphatase: 135 U/L — ABNORMAL HIGH (ref 39–117)
Bilirubin, Direct: 0.2 mg/dL (ref 0.1–0.3)
Total Bilirubin: 1 mg/dL (ref 0.2–1.2)
Total Protein: 7 g/dL (ref 6.0–8.3)

## 2024-09-14 LAB — MICROALBUMIN / CREATININE URINE RATIO
Creatinine,U: 153.5 mg/dL
Microalb Creat Ratio: 202.8 mg/g — ABNORMAL HIGH (ref 0.0–30.0)
Microalb, Ur: 31.1 mg/dL — ABNORMAL HIGH (ref 0.7–1.9)

## 2024-09-14 LAB — BASIC METABOLIC PANEL WITH GFR
BUN: 25 mg/dL — ABNORMAL HIGH (ref 6–23)
CO2: 27 meq/L (ref 19–32)
Calcium: 9.6 mg/dL (ref 8.4–10.5)
Chloride: 103 meq/L (ref 96–112)
Creatinine, Ser: 1.07 mg/dL (ref 0.40–1.20)
GFR: 53.12 mL/min — ABNORMAL LOW
Glucose, Bld: 91 mg/dL (ref 70–99)
Potassium: 4.4 meq/L (ref 3.5–5.1)
Sodium: 139 meq/L (ref 135–145)

## 2024-09-14 LAB — HEMOGLOBIN A1C: Hgb A1c MFr Bld: 5.7 % (ref 4.6–6.5)

## 2024-09-16 ENCOUNTER — Encounter: Payer: Self-pay | Admitting: Internal Medicine

## 2024-09-16 ENCOUNTER — Ambulatory Visit: Admitting: Internal Medicine

## 2024-09-16 VITALS — BP 138/70 | HR 81 | Temp 98.1°F | Ht 60.0 in | Wt 160.0 lb

## 2024-09-16 DIAGNOSIS — G500 Trigeminal neuralgia: Secondary | ICD-10-CM

## 2024-09-16 DIAGNOSIS — Z7985 Long-term (current) use of injectable non-insulin antidiabetic drugs: Secondary | ICD-10-CM | POA: Diagnosis not present

## 2024-09-16 DIAGNOSIS — R748 Abnormal levels of other serum enzymes: Secondary | ICD-10-CM

## 2024-09-16 DIAGNOSIS — N1832 Chronic kidney disease, stage 3b: Secondary | ICD-10-CM

## 2024-09-16 DIAGNOSIS — Z23 Encounter for immunization: Secondary | ICD-10-CM

## 2024-09-16 DIAGNOSIS — I739 Peripheral vascular disease, unspecified: Secondary | ICD-10-CM

## 2024-09-16 DIAGNOSIS — D329 Benign neoplasm of meninges, unspecified: Secondary | ICD-10-CM

## 2024-09-16 DIAGNOSIS — I480 Paroxysmal atrial fibrillation: Secondary | ICD-10-CM | POA: Diagnosis not present

## 2024-09-16 DIAGNOSIS — I10 Essential (primary) hypertension: Secondary | ICD-10-CM | POA: Diagnosis not present

## 2024-09-16 DIAGNOSIS — E1165 Type 2 diabetes mellitus with hyperglycemia: Secondary | ICD-10-CM

## 2024-09-16 DIAGNOSIS — E7800 Pure hypercholesterolemia, unspecified: Secondary | ICD-10-CM

## 2024-09-16 DIAGNOSIS — Z0000 Encounter for general adult medical examination without abnormal findings: Secondary | ICD-10-CM | POA: Diagnosis not present

## 2024-09-16 MED ORDER — TIRZEPATIDE 12.5 MG/0.5ML ~~LOC~~ SOAJ: 12.5000 mg | SUBCUTANEOUS | 0 refills | Status: AC

## 2024-09-16 MED ORDER — LOSARTAN POTASSIUM 50 MG PO TABS: 50.0000 mg | ORAL_TABLET | Freq: Every day | ORAL | 1 refills | Status: AC

## 2024-09-16 NOTE — Assessment & Plan Note (Addendum)
 Physical today 09/16/24.  PAP 11/29/19 - negative with negative HPV. PAP 03/25/22- negative with negative HPV.  Mammogram 08/22/24 - Birads I.  Colonoscopy 08/2016 - recommended f/u in 5 years. Colonoscopy 10/2022 - diverticulosis. Inadequate prep.  Recommended f/u colonoscopy in 6-12 months. F/u colonoscopy 01/2024 - diverticulosis and internal hemorrhoids. Recommended f/u colonoscopy in 10 years.

## 2024-09-16 NOTE — Progress Notes (Signed)
 "  Subjective:    Patient ID: Kristin James, female    DOB: 1956/03/25, 69 y.o.   MRN: [id number]  Patient here for  Chief Complaint  Patient presents with   Medical Management of Chronic Issues    HPI Here for a physical exam. Has known diabetes. On mounjaro . Had f/u with cardiology 09/22/23. Recommended increase diltiazem  ER to 240mg  q day. Continue losartan  50mg  bid and atenolol  50mg  bid. Has f/u planned in 10/2024. Had f/u with Dr Vear 04/07/24 - follow up MRI 02/04/24 - stability of the meningioma. Also f/u - ataxic gait. Recommended to continue lamictal , but decrease to 50mg  q day - to see if helps with balance. S/p colonoscopy 02/26/24 - diverticulosis and internal hemorrhoids. Recommended f/u colonoscopy in 10 years. Discussed recent labs.  GFR improved. Overall feels - things are stable. No chest pain reported. Breathing stable.    Past Medical History:  Diagnosis Date   Afib (HCC)    Cellulitis of right arm    Embolus to lower extremity (HCC)    a. 05/2014 s/p thrombolysis of LLE.   Hiatal hernia    History of echocardiogram    a. 07/2020 Echo: EF 55-60%, no rwma, Nl RV size/fxn. Mildly dil LA. Mild MR/MS. Sev mitral annular Ca2+. Mild Ao sclerosis w/o stenosis.   History of stress test    a. 05/2014 lexiscan MV: EF 67%, no ischemia/infarct.   Hypercholesteremia    Hyperglycemia    Hypertension    Left Cerebellopontine Angle Meningioma (HCC)    a. 10/2016 s/p Gamma knife.   Osteoarthritis    PAF (paroxysmal atrial fibrillation) (HCC)    a. Dx 10/2014->converted on amio. CHA2DS2VASc = 3-4-->Eliquis .   Proteinuria    Shingles    dec 2021   Spinal stenosis at L4-L5 level    Thrombophlebitis of posterior tibial vein (HCC) 06/01/2014   Trigeminal neuralgia 08/2016   Past Surgical History:  Procedure Laterality Date   AORTOGRAM     BACK SURGERY  11/13/2014   CARDIAC CATHETERIZATION     CARDIOVERSION N/A 07/19/2020   Procedure: CARDIOVERSION;  Surgeon: Perla Evalene PARAS, MD;   Location: [facility] ORS;  Service: Cardiovascular;  Laterality: N/A;   CHOLECYSTECTOMY     COLONOSCOPY N/A 02/26/2024   Procedure: COLONOSCOPY;  Surgeon: Maryruth Ole DASEN, MD;  Location: [facility] ENDOSCOPY;  Service: Endoscopy;  Laterality: N/A;  Patient on Eliquis  and MOUNJARO    COLONOSCOPY WITH PROPOFOL  N/A 08/15/2016   Procedure: COLONOSCOPY WITH PROPOFOL ;  Surgeon: Gladis RAYMOND Mariner, MD;  Location: [facility];  Service: Endoscopy;  Laterality: N/A;   COLONOSCOPY WITH PROPOFOL  N/A 11/07/2022   Procedure: COLONOSCOPY WITH PROPOFOL ;  Surgeon: Maryruth Ole DASEN, MD;  Location: [facility] ENDOSCOPY;  Service: Endoscopy;  Laterality: N/A;   JOINT REPLACEMENT     KNEE ARTHROSCOPY     LOWER EXTREMITY ANGIOGRAM     Family History  Problem Relation Age of Onset   Hypertension Mother    Dementia Mother    Stroke Father        3 strokes   Hypertension Father    Breast cancer Paternal Grandmother    Colon cancer Neg Hx    Social History   Socioeconomic History   Marital status: Married    Spouse name: Not on file   Number of children: 2   Years of education: Not on file   Highest education level: Not on file  Occupational History   Not on file  Tobacco Use   Smoking status:  Never   Smokeless tobacco: Never  Vaping Use   Vaping status: Never Used  Substance and Sexual Activity   Alcohol use: Not Currently    Comment: occ. wine   Drug use: No   Sexual activity: Not on file  Other Topics Concern   Not on file  Social History Narrative   Right handed    Caffeine use: coffee (1/2 cup per day)   Social Drivers of Health   Tobacco Use: Low Risk (09/19/2024)   Patient History    Smoking Tobacco Use: Never    Smokeless Tobacco Use: Never    Passive Exposure: Not on file  Financial Resource Strain: Not on file  Food Insecurity: Not on file  Transportation Needs: Not on file  Physical Activity: Unknown (05/12/2024)   Exercise Vital Sign    Days of Exercise per Week: 3 days    Minutes  of Exercise per Session: Not on file  Stress: No Stress Concern Present (09/07/2023)   [employer] of Occupational Health - Occupational Stress Questionnaire    Feeling of Stress : Only a little  Social Connections: Unknown (05/12/2024)   Social Connection and Isolation Panel    Frequency of Communication with Friends and Family: Not on file    Frequency of Social Gatherings with Friends and Family: Not on file    Attends Religious Services: Not on file    Active Member of Clubs or Organizations: Patient declined    Attends Banker Meetings: Not on file    Marital Status: Not on file  Depression (PHQ2-9): Low Risk (09/16/2024)   Depression (PHQ2-9)    PHQ-2 Score: 0  Alcohol Screen: Not on file  Housing: Unknown (12/22/2023)   [facility]   Epic    Unable to Pay for Housing in the Last Year: Not on file    Number of Times Moved in the Last Year: Not on file    At any time in the past 12 months, were you homeless or living in a shelter (including now)?: No  Utilities: Not on file  Health Literacy: Not on file     Review of Systems  Constitutional:  Negative for appetite change and unexpected weight change.  HENT:  Negative for congestion, sinus pressure and sore throat.   Eyes:  Negative for pain and visual disturbance.  Respiratory:  Negative for cough, chest tightness and shortness of breath.   Cardiovascular:  Negative for chest pain and palpitations.       No increased swelling.   Gastrointestinal:  Negative for abdominal pain, diarrhea, nausea and vomiting.  Genitourinary:  Negative for difficulty urinating and dysuria.  Musculoskeletal:  Negative for joint swelling and myalgias.  Skin:  Negative for color change and rash.  Neurological:  Negative for dizziness and headaches.  Hematological:  Negative for adenopathy. Does not bruise/bleed easily.  Psychiatric/Behavioral:  Negative for agitation and dysphoric mood.         Objective:     BP 138/70   Pulse 81   Temp 98.1 F (36.7 C) (Oral)   Ht 5' (1.524 m)   Wt 160 lb (72.6 kg)   SpO2 98%   BMI 31.25 kg/m  Wt Readings from Last 3 Encounters:  09/16/24 160 lb (72.6 kg)  06/01/24 163 lb 6.4 oz (74.1 kg)  05/13/24 163 lb 6.4 oz (74.1 kg)    Physical Exam Vitals reviewed.  Constitutional:      General: She is not in acute  distress.    Appearance: Normal appearance.  HENT:     Head: Normocephalic and atraumatic.     Right Ear: External ear normal.     Left Ear: External ear normal.     Mouth/Throat:     Pharynx: No oropharyngeal exudate or posterior oropharyngeal erythema.  Eyes:     General: No scleral icterus.       Right eye: No discharge.        Left eye: No discharge.     Conjunctiva/sclera: Conjunctivae normal.  Neck:     Thyroid : No thyromegaly.  Cardiovascular:     Rate and Rhythm: Normal rate and regular rhythm.  Pulmonary:     Effort: No respiratory distress.     Breath sounds: Normal breath sounds. No wheezing.  Abdominal:     General: Bowel sounds are normal.     Palpations: Abdomen is soft.     Tenderness: There is no abdominal tenderness.  Musculoskeletal:        General: No swelling or tenderness.     Cervical back: Neck supple. No tenderness.  Lymphadenopathy:     Cervical: No cervical adenopathy.  Skin:    Findings: No erythema or rash.  Neurological:     Mental Status: She is alert.  Psychiatric:        Mood and Affect: Mood normal.        Behavior: Behavior normal.         Outpatient Encounter Medications as of 09/16/2024  Medication Sig   acetaminophen  (TYLENOL ) 650 MG CR tablet Take by mouth.   atenolol  (TENORMIN ) 50 MG tablet Take 1 tablet (50 mg total) by mouth 2 (two) times daily.   atorvastatin  (LIPITOR) 40 MG tablet TAKE 1 TABLET BY MOUTH EVERY DAY   diltiazem  (CARDIZEM  CD) 240 MG 24 hr capsule Take 1 capsule (240 mg total) by mouth daily. (Patient taking differently: Take 120 mg by mouth daily.)    ELIQUIS  5 MG TABS tablet TAKE 1 TABLET(5 MG) BY MOUTH TWICE DAILY   lamoTRIgine  (LAMICTAL ) 100 MG tablet TAKE 1 TABLET(100 MG) BY MOUTH TWICE DAILY   MOUNJARO  12.5 MG/0.5ML Pen ADMINISTER 12.5 MG UNDER THE SKIN 1 TIME A WEEK   losartan  (COZAAR ) 50 MG tablet Take 1 tablet (50 mg total) by mouth daily.   tirzepatide  (MOUNJARO ) 12.5 MG/0.5ML Pen Inject 12.5 mg into the skin once a week.   [DISCONTINUED] clotrimazole -betamethasone  (LOTRISONE ) cream Apply 1 Application topically daily.   [DISCONTINUED] ketoconazole  (NIZORAL ) 2 % cream Apply 1 Application topically daily.   [DISCONTINUED] losartan  (COZAAR ) 50 MG tablet Take 1 tablet (50 mg total) by mouth daily.   [DISCONTINUED] tirzepatide  (MOUNJARO ) 10 MG/0.5ML Pen Inject 10 mg into the skin once a week.   [DISCONTINUED] tirzepatide  (MOUNJARO ) 12.5 MG/0.5ML Pen Inject 12.5 mg into the skin once a week.   No facility-administered encounter medications on file as of 09/16/2024.     Lab Results  Component Value Date   WBC 7.0 05/11/2024   HGB 15.8 (H) 05/11/2024   HCT 46.9 (H) 05/11/2024   PLT 202.0 05/11/2024   GLUCOSE 91 09/14/2024   CHOL 170 09/14/2024   TRIG 81.0 09/14/2024   HDL 50.80 09/14/2024   LDLCALC 103 (H) 09/14/2024   ALT 19 09/14/2024   AST 19 09/14/2024   NA 139 09/14/2024   K 4.4 09/14/2024   CL 103 09/14/2024   CREATININE 1.07 09/14/2024   BUN 25 (H) 09/14/2024   CO2 27 09/14/2024   TSH 2.99 01/11/2024  INR 1.5 (H) 06/03/2020   HGBA1C 5.7 09/14/2024   MICROALBUR 31.1 (H) 09/14/2024    MM 3D SCREENING MAMMOGRAM BILATERAL BREAST Result Date: 08/27/2024 CLINICAL DATA:  Screening. EXAM: DIGITAL SCREENING BILATERAL MAMMOGRAM WITH TOMOSYNTHESIS AND CAD TECHNIQUE: Bilateral screening digital craniocaudal and mediolateral oblique mammograms were obtained. Bilateral screening digital breast tomosynthesis was performed. The images were evaluated with computer-aided detection. COMPARISON:  Previous exam(s). ACR Breast  Density Category b: There are scattered areas of fibroglandular density. FINDINGS: There are no findings suspicious for malignancy. IMPRESSION: No mammographic evidence of malignancy. A result letter of this screening mammogram will be mailed directly to the patient. RECOMMENDATION: Screening mammogram in one year. (Code:[id number]-B-01Y) BI-RADS CATEGORY  1: Negative. Electronically Signed   By: Dina  Arceo M.D.   On: 08/27/2024 14:06       Assessment & Plan:  Health care maintenance Assessment & Plan: Physical today 09/16/24.  PAP 11/29/19 - negative with negative HPV. PAP 03/25/22- negative with negative HPV.  Mammogram 08/22/24 - Birads I.  Colonoscopy 08/2016 - recommended f/u in 5 years. Colonoscopy 10/2022 - diverticulosis. Inadequate prep.  Recommended f/u colonoscopy in 6-12 months. F/u colonoscopy 01/2024 - diverticulosis and internal hemorrhoids. Recommended f/u colonoscopy in 10 years.    Type 2 diabetes mellitus with hyperglycemia, without long-term current use of insulin (HCC) Assessment & Plan: Low carb diet and exercise. Currently on mounjaro  12.5mg  weekly. Tolerating. Continue current dose. Follow sugars. Follow met b and A1c. Discussed recent labs.  Lab Results  Component Value Date   HGBA1C 5.7 09/14/2024     Orders: -     Tirzepatide ; Inject 12.5 mg into the skin once a week.  Dispense: 6 mL; Refill: 0 -     Basic metabolic panel with GFR; Future -     Hemoglobin A1c; Future  Hypercholesterolemia Assessment & Plan: Continue lipitor. Low cholesterol diet and exercise. Follow lipid panel.  Lab Results  Component Value Date   CHOL 170 09/14/2024   HDL 50.80 09/14/2024   LDLCALC 103 (H) 09/14/2024   TRIG 81.0 09/14/2024   CHOLHDL 3 09/14/2024     Orders: -     Lipid panel; Future -     Hepatic function panel; Future  Need for influenza vaccination -     Flu vaccine HIGH DOSE PF(Fluzone Trivalent)  Trigeminal neuralgia of left side of face Assessment & Plan: No pain.   Off gabapentin . Continues on lamictal . Dr. Sater recommended lowering dose as outlined. Follow.    Paroxysmal atrial fibrillation [facility]) Assessment & Plan: Followed by cardiology.  Currently on diltiazem  and atenolol . Continue eliquis . Stable. Follow.    PAD (peripheral artery disease) Assessment & Plan: Status post angioplasty.  On Eliquis .  Continue risk factor modification.  Continue statin. Stable.    Meningioma [facility]) Assessment & Plan:  Had f/u with Dr Vear 04/07/24 - follow up MRI 02/04/24 - stability of the meningioma.    Primary hypertension Assessment & Plan: Blood pressure as outlined. Continue diltiazem , atenolol  and losartan . Follow. No changes in medication today. Follow metabolic panel.    Elevated alkaline phosphatase level Assessment & Plan: Previously saw GI.  Work-up including MRI/MRCP unrevealing.  Follow liver function test.  Recent alk phos - 135- overall stable. Follow.    Stage 3b chronic kidney disease (HCC) Assessment & Plan: Off meloxicam .   Seeing nephrology.  Continue to avoid antiinflammatories. Stay hydrated.  Continue losartan . Have previously discussed the possibility of adding SGLT2 inhibitor. Blood pressure varies. Have discussed the possibility  of SGLT 2 inhibitor lowering pressure.  Will hold at this time. Follow.    Other orders -     Losartan  Potassium; Take 1 tablet (50 mg total) by mouth daily.  Dispense: 90 tablet; Refill: 1     Allena Hamilton, MD "

## 2024-09-19 ENCOUNTER — Encounter: Payer: Self-pay | Admitting: Internal Medicine

## 2024-09-19 NOTE — Assessment & Plan Note (Signed)
 Followed by cardiology.  Currently on diltiazem  and atenolol . Continue eliquis . Stable. Follow.

## 2024-09-19 NOTE — Assessment & Plan Note (Signed)
 Continue lipitor. Low cholesterol diet and exercise. Follow lipid panel.  Lab Results  Component Value Date   CHOL 170 09/14/2024   HDL 50.80 09/14/2024   LDLCALC 103 (H) 09/14/2024   TRIG 81.0 09/14/2024   CHOLHDL 3 09/14/2024

## 2024-09-19 NOTE — Assessment & Plan Note (Signed)
 Low carb diet and exercise. Currently on mounjaro  12.5mg  weekly. Tolerating. Continue current dose. Follow sugars. Follow met b and A1c. Discussed recent labs.  Lab Results  Component Value Date   HGBA1C 5.7 09/14/2024

## 2024-09-19 NOTE — Assessment & Plan Note (Signed)
 Blood pressure as outlined. Continue diltiazem , atenolol  and losartan . Follow. No changes in medication today. Follow metabolic panel.

## 2024-09-19 NOTE — Assessment & Plan Note (Signed)
 No pain.  Off gabapentin . Continues on lamictal . Dr. Sater recommended lowering dose as outlined. Follow.

## 2024-09-19 NOTE — Assessment & Plan Note (Signed)
 Had f/u with Dr Vear 04/07/24 - follow up MRI 02/04/24 - stability of the meningioma.

## 2024-09-19 NOTE — Assessment & Plan Note (Signed)
 Previously saw GI.  Work-up including MRI/MRCP unrevealing.  Follow liver function test.  Recent alk phos - 135- overall stable. Follow.

## 2024-09-19 NOTE — Assessment & Plan Note (Signed)
 Off meloxicam .   Seeing nephrology.  Continue to avoid antiinflammatories. Stay hydrated.  Continue losartan . Have previously discussed the possibility of adding SGLT2 inhibitor. Blood pressure varies. Have discussed the possibility of SGLT 2 inhibitor lowering pressure.  Will hold at this time. Follow.

## 2024-09-19 NOTE — Assessment & Plan Note (Signed)
 Status post angioplasty.  On Eliquis .  Continue risk factor modification.  Continue statin. Stable.

## 2024-10-03 ENCOUNTER — Ambulatory Visit: Admitting: Podiatry

## 2024-10-05 ENCOUNTER — Ambulatory Visit: Admitting: Podiatry

## 2024-10-05 VITALS — Ht 61.0 in | Wt 160.0 lb

## 2024-10-05 DIAGNOSIS — B351 Tinea unguium: Secondary | ICD-10-CM | POA: Diagnosis not present

## 2024-10-05 MED ORDER — TERBINAFINE HCL 250 MG PO TABS: 250.0000 mg | ORAL_TABLET | Freq: Every day | ORAL | 0 refills | Status: AC

## 2024-10-05 NOTE — Progress Notes (Signed)
"  °  Subjective:  Patient ID: Kristin James, female    DOB: [birth year],  MRN: [id number]  Chief Complaint  Patient presents with   Nail Problem    RM 1 Patient is here to f/u on onychomycosis. Toe nails mostly affected is the right 1st- nail is thick and discolored. Pt states burning and itching in all toes after receiving pedicure. Pt states applying a topical ointment for the nail fungus and itching sensation.    69 y.o. female presents with the above complaint. History confirmed with patient.  She returns for follow-up some improvement in her nails she is agreeable has a course back in December.  Still notes fungal growth.  Objective:  Physical Exam: warm, good capillary refill, does have some dependent rubor in the toes which improves with warming.  No trophic changes or ulcerative lesions, normal DP and PT pulses, normal sensory exam, and onychomycosis of multiple toenails.  Skin has improved no tinea pedis or peeling skin no evidence of dermatitis.  Assessment:     ICD-10-CM   1. Onychomycosis  B35.1         Plan:  Patient was evaluated and treated and all questions answered.  Discussed with her that itching sensation may be circulatory related to a low Raynaud's type phenomenon that is happening with significant changes in temperature between cold and hot with soaking in steam.  Advised to avoid this.  No evidence of this is dermatitis.  May discontinue Lotrisone  cream.  Regarding the onychomycosis had some improvement but has not resolved.  No side effects taking the Lamisil  so far.  Recommended repeat course.  Rx sent to pharmacy.  Follow-up in 4 months to reevaluate.  Return in about 4 months (around 02/02/2025) for follow up after nail fungus treatment.   "

## 2024-10-05 NOTE — Progress Notes (Signed)
 Kristin James                                          MRN: [id number]   10/05/2024   The [facility] Quality Team Specialist reviewed this patient medical record for the purposes of chart review for care gap closure. The following were reviewed: chart review for care gap closure-kidney health evaluation for diabetes:eGFR  and uACR for 2025.    [facility] Quality Team

## 2024-10-18 ENCOUNTER — Ambulatory Visit: Admitting: Cardiovascular Disease

## 2025-01-17 ENCOUNTER — Other Ambulatory Visit

## 2025-01-19 ENCOUNTER — Ambulatory Visit: Admitting: Internal Medicine

## 2025-01-30 ENCOUNTER — Ambulatory Visit: Admitting: Podiatry

## 2025-04-10 ENCOUNTER — Ambulatory Visit: Admitting: Neurology
